# Patient Record
Sex: Female | Born: 1935 | Race: Black or African American | Hispanic: No | Smoking: Never smoker
Health system: Southern US, Community
[De-identification: ages and names within clinical notes are randomized; demographics above are authoritative.]

## PROBLEM LIST (undated history)

## (undated) DIAGNOSIS — I10 Essential (primary) hypertension: Secondary | ICD-10-CM

## (undated) DIAGNOSIS — M47816 Spondylosis without myelopathy or radiculopathy, lumbar region: Secondary | ICD-10-CM

## (undated) DIAGNOSIS — E7800 Pure hypercholesterolemia, unspecified: Secondary | ICD-10-CM

## (undated) DIAGNOSIS — M8580 Other specified disorders of bone density and structure, unspecified site: Secondary | ICD-10-CM

## (undated) DIAGNOSIS — G501 Atypical facial pain: Secondary | ICD-10-CM

## (undated) DIAGNOSIS — IMO0002 Reserved for concepts with insufficient information to code with codable children: Secondary | ICD-10-CM

## (undated) DIAGNOSIS — M5030 Other cervical disc degeneration, unspecified cervical region: Secondary | ICD-10-CM

## (undated) DIAGNOSIS — M7120 Synovial cyst of popliteal space [Baker], unspecified knee: Secondary | ICD-10-CM

## (undated) DIAGNOSIS — E1165 Type 2 diabetes mellitus with hyperglycemia: Secondary | ICD-10-CM

## (undated) DIAGNOSIS — E119 Type 2 diabetes mellitus without complications: Secondary | ICD-10-CM

## (undated) DIAGNOSIS — F419 Anxiety disorder, unspecified: Secondary | ICD-10-CM

## (undated) DIAGNOSIS — K219 Gastro-esophageal reflux disease without esophagitis: Secondary | ICD-10-CM

## (undated) DIAGNOSIS — M179 Osteoarthritis of knee, unspecified: Secondary | ICD-10-CM

## (undated) DIAGNOSIS — B351 Tinea unguium: Secondary | ICD-10-CM

## (undated) DIAGNOSIS — E669 Obesity, unspecified: Secondary | ICD-10-CM

## (undated) DIAGNOSIS — K5730 Diverticulosis of large intestine without perforation or abscess without bleeding: Secondary | ICD-10-CM

## (undated) DIAGNOSIS — M1710 Unilateral primary osteoarthritis, unspecified knee: Secondary | ICD-10-CM

## (undated) HISTORY — DX: Type 2 diabetes mellitus without complications: E11.9

## (undated) HISTORY — DX: Reserved for concepts with insufficient information to code with codable children: IMO0002

## (undated) HISTORY — DX: Tinea unguium: B35.1

## (undated) HISTORY — DX: Osteoarthritis of knee, unspecified: M17.9

## (undated) HISTORY — DX: Obesity, unspecified: E66.9

## (undated) HISTORY — DX: Pure hypercholesterolemia, unspecified: E78.00

## (undated) HISTORY — DX: Other specified disorders of bone density and structure, unspecified site: M85.80

## (undated) HISTORY — DX: Diverticulosis of large intestine without perforation or abscess without bleeding: K57.30

## (undated) HISTORY — DX: Essential (primary) hypertension: I10

## (undated) HISTORY — DX: Gastro-esophageal reflux disease without esophagitis: K21.9

## (undated) HISTORY — DX: Atypical facial pain: G50.1

## (undated) HISTORY — DX: Spondylosis without myelopathy or radiculopathy, lumbar region: M47.816

## (undated) HISTORY — DX: Anxiety disorder, unspecified: F41.9

## (undated) HISTORY — DX: Unilateral primary osteoarthritis, unspecified knee: M17.10

## (undated) HISTORY — DX: Synovial cyst of popliteal space (Baker), unspecified knee: M71.20

## (undated) HISTORY — DX: Other cervical disc degeneration, unspecified cervical region: M50.30

## (undated) HISTORY — DX: Type 2 diabetes mellitus with hyperglycemia: E11.65

---

## 1997-11-11 ENCOUNTER — Other Ambulatory Visit: Admission: RE | Admit: 1997-11-11 | Discharge: 1997-11-11 | Payer: Self-pay | Admitting: Obstetrics and Gynecology

## 1998-06-18 ENCOUNTER — Other Ambulatory Visit: Admission: RE | Admit: 1998-06-18 | Discharge: 1998-06-18 | Payer: Self-pay | Admitting: Obstetrics and Gynecology

## 1999-10-22 ENCOUNTER — Encounter: Admission: RE | Admit: 1999-10-22 | Discharge: 1999-10-22 | Payer: Self-pay | Admitting: Pulmonary Disease

## 1999-10-22 ENCOUNTER — Encounter: Payer: Self-pay | Admitting: Pulmonary Disease

## 1999-12-31 ENCOUNTER — Other Ambulatory Visit: Admission: RE | Admit: 1999-12-31 | Discharge: 1999-12-31 | Payer: Self-pay | Admitting: Obstetrics and Gynecology

## 2000-02-29 ENCOUNTER — Other Ambulatory Visit: Admission: RE | Admit: 2000-02-29 | Discharge: 2000-02-29 | Payer: Self-pay | Admitting: Obstetrics and Gynecology

## 2000-03-30 ENCOUNTER — Encounter (INDEPENDENT_AMBULATORY_CARE_PROVIDER_SITE_OTHER): Payer: Self-pay | Admitting: Specialist

## 2000-03-30 ENCOUNTER — Other Ambulatory Visit: Admission: RE | Admit: 2000-03-30 | Discharge: 2000-03-30 | Payer: Self-pay | Admitting: Obstetrics and Gynecology

## 2000-04-12 HISTORY — PX: VAGINAL HYSTERECTOMY: SUR661

## 2000-06-27 ENCOUNTER — Other Ambulatory Visit: Admission: RE | Admit: 2000-06-27 | Discharge: 2000-06-27 | Payer: Self-pay | Admitting: Obstetrics and Gynecology

## 2000-07-25 ENCOUNTER — Encounter: Payer: Self-pay | Admitting: Obstetrics and Gynecology

## 2000-07-29 ENCOUNTER — Ambulatory Visit (HOSPITAL_COMMUNITY): Admission: RE | Admit: 2000-07-29 | Discharge: 2000-07-29 | Payer: Self-pay | Admitting: Obstetrics and Gynecology

## 2000-07-29 ENCOUNTER — Encounter (INDEPENDENT_AMBULATORY_CARE_PROVIDER_SITE_OTHER): Payer: Self-pay | Admitting: Specialist

## 2000-08-22 ENCOUNTER — Encounter: Admission: RE | Admit: 2000-08-22 | Discharge: 2000-11-20 | Payer: Self-pay | Admitting: *Deleted

## 2000-09-08 ENCOUNTER — Other Ambulatory Visit: Admission: RE | Admit: 2000-09-08 | Discharge: 2000-09-08 | Payer: Self-pay | Admitting: Obstetrics and Gynecology

## 2000-10-27 ENCOUNTER — Encounter: Payer: Self-pay | Admitting: Pulmonary Disease

## 2000-10-27 ENCOUNTER — Encounter: Admission: RE | Admit: 2000-10-27 | Discharge: 2000-10-27 | Payer: Self-pay | Admitting: Pulmonary Disease

## 2000-11-24 ENCOUNTER — Inpatient Hospital Stay (HOSPITAL_COMMUNITY): Admission: RE | Admit: 2000-11-24 | Discharge: 2000-11-26 | Payer: Self-pay | Admitting: Obstetrics and Gynecology

## 2000-11-24 ENCOUNTER — Encounter (INDEPENDENT_AMBULATORY_CARE_PROVIDER_SITE_OTHER): Payer: Self-pay | Admitting: Specialist

## 2001-01-23 ENCOUNTER — Other Ambulatory Visit: Admission: RE | Admit: 2001-01-23 | Discharge: 2001-01-23 | Payer: Self-pay | Admitting: Obstetrics and Gynecology

## 2001-04-24 ENCOUNTER — Other Ambulatory Visit: Admission: RE | Admit: 2001-04-24 | Discharge: 2001-04-24 | Payer: Self-pay | Admitting: Obstetrics and Gynecology

## 2001-11-09 ENCOUNTER — Encounter: Admission: RE | Admit: 2001-11-09 | Discharge: 2001-11-09 | Payer: Self-pay | Admitting: Obstetrics and Gynecology

## 2001-11-09 ENCOUNTER — Encounter: Payer: Self-pay | Admitting: Obstetrics and Gynecology

## 2002-12-06 ENCOUNTER — Encounter: Admission: RE | Admit: 2002-12-06 | Discharge: 2002-12-06 | Payer: Self-pay | Admitting: Pulmonary Disease

## 2002-12-06 ENCOUNTER — Encounter: Payer: Self-pay | Admitting: Pulmonary Disease

## 2003-01-01 ENCOUNTER — Encounter (HOSPITAL_BASED_OUTPATIENT_CLINIC_OR_DEPARTMENT_OTHER): Admission: RE | Admit: 2003-01-01 | Discharge: 2003-04-01 | Payer: Self-pay | Admitting: Internal Medicine

## 2003-04-18 ENCOUNTER — Encounter (HOSPITAL_BASED_OUTPATIENT_CLINIC_OR_DEPARTMENT_OTHER): Admission: RE | Admit: 2003-04-18 | Discharge: 2003-05-03 | Payer: Self-pay | Admitting: Internal Medicine

## 2003-12-12 ENCOUNTER — Encounter: Admission: RE | Admit: 2003-12-12 | Discharge: 2003-12-12 | Payer: Self-pay | Admitting: Pulmonary Disease

## 2004-03-19 ENCOUNTER — Ambulatory Visit: Payer: Self-pay | Admitting: Pulmonary Disease

## 2004-06-19 ENCOUNTER — Ambulatory Visit: Payer: Self-pay | Admitting: Pulmonary Disease

## 2004-08-17 ENCOUNTER — Ambulatory Visit: Payer: Self-pay | Admitting: Pulmonary Disease

## 2004-10-19 ENCOUNTER — Ambulatory Visit: Payer: Self-pay | Admitting: Pulmonary Disease

## 2004-12-24 ENCOUNTER — Encounter: Admission: RE | Admit: 2004-12-24 | Discharge: 2004-12-24 | Payer: Self-pay | Admitting: Pulmonary Disease

## 2005-01-19 ENCOUNTER — Ambulatory Visit: Payer: Self-pay | Admitting: Pulmonary Disease

## 2005-02-11 ENCOUNTER — Ambulatory Visit: Payer: Self-pay | Admitting: Internal Medicine

## 2005-02-16 ENCOUNTER — Ambulatory Visit: Payer: Self-pay

## 2005-03-15 ENCOUNTER — Ambulatory Visit: Payer: Self-pay | Admitting: Internal Medicine

## 2005-04-21 ENCOUNTER — Ambulatory Visit: Payer: Self-pay | Admitting: Pulmonary Disease

## 2005-07-29 ENCOUNTER — Ambulatory Visit: Payer: Self-pay | Admitting: Pulmonary Disease

## 2005-12-01 ENCOUNTER — Ambulatory Visit: Payer: Self-pay | Admitting: Pulmonary Disease

## 2005-12-27 ENCOUNTER — Encounter: Admission: RE | Admit: 2005-12-27 | Discharge: 2005-12-27 | Payer: Self-pay | Admitting: Pulmonary Disease

## 2006-06-21 ENCOUNTER — Ambulatory Visit: Payer: Self-pay | Admitting: Pulmonary Disease

## 2006-06-21 LAB — CONVERTED CEMR LAB
ALT: 49 units/L — ABNORMAL HIGH (ref 0–40)
AST: 43 units/L — ABNORMAL HIGH (ref 0–37)
Albumin: 3.5 g/dL (ref 3.5–5.2)
Alkaline Phosphatase: 60 units/L (ref 39–117)
BUN: 7 mg/dL (ref 6–23)
Basophils Absolute: 0.1 10*3/uL (ref 0.0–0.1)
Basophils Relative: 2 % — ABNORMAL HIGH (ref 0.0–1.0)
Bilirubin, Direct: 0.3 mg/dL (ref 0.0–0.3)
CO2: 31 meq/L (ref 19–32)
Calcium: 9.3 mg/dL (ref 8.4–10.5)
Chloride: 104 meq/L (ref 96–112)
Cholesterol: 146 mg/dL (ref 0–200)
Creatinine, Ser: 0.6 mg/dL (ref 0.4–1.2)
Creatinine,U: 150.5 mg/dL
Eosinophils Absolute: 0.1 10*3/uL (ref 0.0–0.6)
Eosinophils Relative: 2.1 % (ref 0.0–5.0)
GFR calc Af Amer: 127 mL/min
GFR calc non Af Amer: 105 mL/min
Glucose, Bld: 162 mg/dL — ABNORMAL HIGH (ref 70–99)
HCT: 41.1 % (ref 36.0–46.0)
HDL: 35.5 mg/dL — ABNORMAL LOW (ref >39.0)
Hemoglobin: 13.6 g/dL (ref 12.0–15.0)
Hgb A1c MFr Bld: 7.8 %
Hgb A1c MFr Bld: 7.8 % — ABNORMAL HIGH (ref 4.6–6.0)
LDL Cholesterol: 86 mg/dL (ref 0–99)
Lymphocytes Relative: 28 % (ref 12.0–46.0)
MCHC: 33 g/dL (ref 30.0–36.0)
MCV: 80.8 fL (ref 78.0–100.0)
Microalb Creat Ratio: 21.9 mg/g (ref 0.0–30.0)
Microalb, Ur: 3.3 mg/dL — ABNORMAL HIGH (ref 0.0–1.9)
Monocytes Absolute: 0.3 10*3/uL (ref 0.2–0.7)
Monocytes Relative: 6.2 % (ref 3.0–11.0)
Neutro Abs: 3.1 10*3/uL (ref 1.4–7.7)
Neutrophils Relative %: 61.7 % (ref 43.0–77.0)
Platelets: 256 10*3/uL (ref 150–400)
Potassium: 3.9 meq/L (ref 3.5–5.1)
RBC: 5.09 M/uL (ref 3.87–5.11)
RDW: 13 % (ref 11.5–14.6)
Sodium: 142 meq/L (ref 135–145)
TSH: 0.9 microintl units/mL
TSH: 0.9 microintl units/mL (ref 0.35–5.50)
Total Bilirubin: 1.5 mg/dL — ABNORMAL HIGH (ref 0.3–1.2)
Total CHOL/HDL Ratio: 4.1
Total Protein: 7.3 g/dL (ref 6.0–8.3)
Triglycerides: 123 mg/dL (ref 0–149)
VLDL: 25 mg/dL (ref 0–40)
WBC: 5 10*3/uL (ref 4.5–10.5)

## 2006-12-22 ENCOUNTER — Ambulatory Visit: Payer: Self-pay | Admitting: Pulmonary Disease

## 2006-12-22 DIAGNOSIS — K5730 Diverticulosis of large intestine without perforation or abscess without bleeding: Secondary | ICD-10-CM | POA: Insufficient documentation

## 2006-12-22 DIAGNOSIS — R0789 Other chest pain: Secondary | ICD-10-CM | POA: Insufficient documentation

## 2006-12-22 DIAGNOSIS — IMO0002 Reserved for concepts with insufficient information to code with codable children: Secondary | ICD-10-CM | POA: Insufficient documentation

## 2006-12-22 DIAGNOSIS — E785 Hyperlipidemia, unspecified: Secondary | ICD-10-CM

## 2006-12-22 DIAGNOSIS — E1169 Type 2 diabetes mellitus with other specified complication: Secondary | ICD-10-CM | POA: Insufficient documentation

## 2006-12-22 DIAGNOSIS — F411 Generalized anxiety disorder: Secondary | ICD-10-CM | POA: Insufficient documentation

## 2006-12-22 DIAGNOSIS — M5030 Other cervical disc degeneration, unspecified cervical region: Secondary | ICD-10-CM | POA: Insufficient documentation

## 2006-12-22 DIAGNOSIS — M7120 Synovial cyst of popliteal space [Baker], unspecified knee: Secondary | ICD-10-CM | POA: Insufficient documentation

## 2006-12-22 DIAGNOSIS — M1710 Unilateral primary osteoarthritis, unspecified knee: Secondary | ICD-10-CM

## 2006-12-22 DIAGNOSIS — G501 Atypical facial pain: Secondary | ICD-10-CM | POA: Insufficient documentation

## 2006-12-22 DIAGNOSIS — B351 Tinea unguium: Secondary | ICD-10-CM | POA: Insufficient documentation

## 2006-12-22 DIAGNOSIS — M47817 Spondylosis without myelopathy or radiculopathy, lumbosacral region: Secondary | ICD-10-CM | POA: Insufficient documentation

## 2006-12-22 DIAGNOSIS — I10 Essential (primary) hypertension: Secondary | ICD-10-CM | POA: Insufficient documentation

## 2006-12-22 DIAGNOSIS — K219 Gastro-esophageal reflux disease without esophagitis: Secondary | ICD-10-CM | POA: Insufficient documentation

## 2006-12-22 DIAGNOSIS — R079 Chest pain, unspecified: Secondary | ICD-10-CM

## 2006-12-22 DIAGNOSIS — M8580 Other specified disorders of bone density and structure, unspecified site: Secondary | ICD-10-CM | POA: Insufficient documentation

## 2006-12-22 LAB — CONVERTED CEMR LAB
BUN: 10 mg/dL (ref 6–23)
CO2: 26 meq/L (ref 19–32)
Calcium: 9.9 mg/dL (ref 8.4–10.5)
Chloride: 108 meq/L (ref 96–112)
Cholesterol: 150 mg/dL (ref 0–200)
Creatinine, Ser: 0.7 mg/dL (ref 0.4–1.2)
GFR calc Af Amer: 107 mL/min
GFR calc non Af Amer: 88 mL/min
Glucose, Bld: 172 mg/dL — ABNORMAL HIGH (ref 70–99)
HDL: 36.5 mg/dL — ABNORMAL LOW (ref >39.0)
Hgb A1c MFr Bld: 7.5 % — ABNORMAL HIGH (ref 4.6–6.0)
LDL Cholesterol: 95 mg/dL (ref 0–99)
Potassium: 4.1 meq/L (ref 3.5–5.1)
Sodium: 140 meq/L (ref 135–145)
Total CHOL/HDL Ratio: 4.1
Triglycerides: 92 mg/dL (ref 0–149)
VLDL: 18 mg/dL (ref 0–40)

## 2006-12-29 ENCOUNTER — Encounter: Admission: RE | Admit: 2006-12-29 | Discharge: 2006-12-29 | Payer: Self-pay | Admitting: Pulmonary Disease

## 2007-04-24 ENCOUNTER — Ambulatory Visit: Payer: Self-pay | Admitting: Pulmonary Disease

## 2007-04-30 LAB — CONVERTED CEMR LAB
ALT: 50 units/L — ABNORMAL HIGH (ref 0–35)
AST: 37 units/L (ref 0–37)
Albumin: 3.9 g/dL (ref 3.5–5.2)
Alkaline Phosphatase: 53 units/L (ref 39–117)
BUN: 6 mg/dL (ref 6–23)
Basophils Absolute: 0 10*3/uL (ref 0.0–0.1)
Basophils Relative: 0.5 % (ref 0.0–1.0)
Bilirubin, Direct: 0.2 mg/dL (ref 0.0–0.3)
CO2: 31 meq/L (ref 19–32)
Calcium: 9.4 mg/dL (ref 8.4–10.5)
Chloride: 105 meq/L (ref 96–112)
Cholesterol: 137 mg/dL (ref 0–200)
Creatinine, Ser: 0.8 mg/dL (ref 0.4–1.2)
Creatinine,U: 103.9 mg/dL
Eosinophils Absolute: 0.1 10*3/uL (ref 0.0–0.6)
Eosinophils Relative: 1.6 % (ref 0.0–5.0)
GFR calc Af Amer: 91 mL/min
GFR calc non Af Amer: 75 mL/min
Glucose, Bld: 132 mg/dL — ABNORMAL HIGH (ref 70–99)
HCT: 43.1 % (ref 36.0–46.0)
HDL: 32.4 mg/dL — ABNORMAL LOW (ref >39.0)
Hemoglobin: 14.5 g/dL (ref 12.0–15.0)
Hgb A1c MFr Bld: 6.8 % — ABNORMAL HIGH (ref 4.6–6.0)
LDL Cholesterol: 87 mg/dL (ref 0–99)
Lymphocytes Relative: 35.5 % (ref 12.0–46.0)
MCHC: 33.7 g/dL (ref 30.0–36.0)
MCV: 81.5 fL (ref 78.0–100.0)
Microalb Creat Ratio: 24.1 mg/g (ref 0.0–30.0)
Microalb, Ur: 2.5 mg/dL — ABNORMAL HIGH (ref 0.0–1.9)
Monocytes Absolute: 0.3 10*3/uL (ref 0.2–0.7)
Monocytes Relative: 5.8 % (ref 3.0–11.0)
Neutro Abs: 2.8 10*3/uL (ref 1.4–7.7)
Neutrophils Relative %: 56.6 % (ref 43.0–77.0)
Platelets: 262 10*3/uL (ref 150–400)
Potassium: 4.2 meq/L (ref 3.5–5.1)
RBC: 5.29 M/uL — ABNORMAL HIGH (ref 3.87–5.11)
RDW: 13.2 % (ref 11.5–14.6)
Sodium: 142 meq/L (ref 135–145)
TSH: 1.05 microintl units/mL (ref 0.35–5.50)
Total Bilirubin: 1.2 mg/dL (ref 0.3–1.2)
Total CHOL/HDL Ratio: 4.2
Total Protein: 7.6 g/dL (ref 6.0–8.3)
Triglycerides: 89 mg/dL (ref 0–149)
VLDL: 18 mg/dL (ref 0–40)
WBC: 4.9 10*3/uL (ref 4.5–10.5)

## 2007-06-05 ENCOUNTER — Encounter: Payer: Self-pay | Admitting: Pulmonary Disease

## 2007-08-23 ENCOUNTER — Ambulatory Visit: Payer: Self-pay | Admitting: Pulmonary Disease

## 2007-08-23 ENCOUNTER — Ambulatory Visit: Payer: Self-pay | Admitting: Family Medicine

## 2007-08-23 LAB — CONVERTED CEMR LAB
BUN: 5 mg/dL — ABNORMAL LOW (ref 6–23)
CO2: 30 meq/L (ref 19–32)
Calcium: 9.5 mg/dL (ref 8.4–10.5)
Chloride: 108 meq/L (ref 96–112)
Creatinine, Ser: 0.7 mg/dL (ref 0.4–1.2)
GFR calc Af Amer: 106 mL/min
GFR calc non Af Amer: 88 mL/min
Glucose, Bld: 142 mg/dL — ABNORMAL HIGH (ref 70–99)
Hgb A1c MFr Bld: 7 % — ABNORMAL HIGH (ref 4.6–6.0)
Potassium: 4.3 meq/L (ref 3.5–5.1)
Sodium: 142 meq/L (ref 135–145)

## 2007-10-10 ENCOUNTER — Encounter: Payer: Self-pay | Admitting: Pulmonary Disease

## 2007-11-08 ENCOUNTER — Ambulatory Visit: Payer: Self-pay | Admitting: Internal Medicine

## 2007-11-08 DIAGNOSIS — S335XXA Sprain of ligaments of lumbar spine, initial encounter: Secondary | ICD-10-CM | POA: Insufficient documentation

## 2008-01-02 ENCOUNTER — Encounter: Admission: RE | Admit: 2008-01-02 | Discharge: 2008-01-02 | Payer: Self-pay | Admitting: Obstetrics and Gynecology

## 2008-02-19 ENCOUNTER — Ambulatory Visit: Payer: Self-pay | Admitting: Pulmonary Disease

## 2008-02-19 DIAGNOSIS — M25512 Pain in left shoulder: Secondary | ICD-10-CM | POA: Insufficient documentation

## 2008-02-19 DIAGNOSIS — M25519 Pain in unspecified shoulder: Secondary | ICD-10-CM

## 2008-02-25 LAB — CONVERTED CEMR LAB
ALT: 50 units/L — ABNORMAL HIGH (ref 0–35)
AST: 39 units/L — ABNORMAL HIGH (ref 0–37)
Albumin: 3.9 g/dL (ref 3.5–5.2)
Alkaline Phosphatase: 51 units/L (ref 39–117)
BUN: 6 mg/dL (ref 6–23)
Basophils Absolute: 0 10*3/uL (ref 0.0–0.1)
Basophils Relative: 0.4 % (ref 0.0–3.0)
Bilirubin, Direct: 0.2 mg/dL (ref 0.0–0.3)
CO2: 30 meq/L (ref 19–32)
Calcium: 9.6 mg/dL (ref 8.4–10.5)
Chloride: 106 meq/L (ref 96–112)
Cholesterol: 124 mg/dL (ref 0–200)
Creatinine, Ser: 0.5 mg/dL (ref 0.4–1.2)
Eosinophils Absolute: 0.1 10*3/uL (ref 0.0–0.7)
Eosinophils Relative: 1.5 % (ref 0.0–5.0)
GFR calc Af Amer: 156 mL/min
GFR calc non Af Amer: 129 mL/min
Glucose, Bld: 109 mg/dL — ABNORMAL HIGH (ref 70–99)
HCT: 41.9 % (ref 36.0–46.0)
HDL: 32.9 mg/dL — ABNORMAL LOW (ref >39.0)
Hemoglobin: 13.9 g/dL (ref 12.0–15.0)
Hgb A1c MFr Bld: 7.2 % — ABNORMAL HIGH (ref 4.6–6.0)
LDL Cholesterol: 74 mg/dL (ref 0–99)
Lymphocytes Relative: 41.9 % (ref 12.0–46.0)
MCHC: 33.3 g/dL (ref 30.0–36.0)
MCV: 81 fL (ref 78.0–100.0)
Monocytes Absolute: 0.4 10*3/uL (ref 0.1–1.0)
Monocytes Relative: 7.2 % (ref 3.0–12.0)
Neutro Abs: 2.6 10*3/uL (ref 1.4–7.7)
Neutrophils Relative %: 49 % (ref 43.0–77.0)
Platelets: 242 10*3/uL (ref 150–400)
Potassium: 4.1 meq/L (ref 3.5–5.1)
RBC: 5.16 M/uL — ABNORMAL HIGH (ref 3.87–5.11)
RDW: 13.2 % (ref 11.5–14.6)
Sodium: 143 meq/L (ref 135–145)
TSH: 1.2 microintl units/mL (ref 0.35–5.50)
Total Bilirubin: 1.5 mg/dL — ABNORMAL HIGH (ref 0.3–1.2)
Total CHOL/HDL Ratio: 3.8
Total Protein: 7.5 g/dL (ref 6.0–8.3)
Triglycerides: 88 mg/dL (ref 0–149)
VLDL: 18 mg/dL (ref 0–40)
WBC: 5.3 10*3/uL (ref 4.5–10.5)

## 2008-03-01 ENCOUNTER — Telehealth (INDEPENDENT_AMBULATORY_CARE_PROVIDER_SITE_OTHER): Payer: Self-pay | Admitting: *Deleted

## 2008-03-04 ENCOUNTER — Encounter: Payer: Self-pay | Admitting: Pulmonary Disease

## 2008-03-26 ENCOUNTER — Encounter: Admission: RE | Admit: 2008-03-26 | Discharge: 2008-06-24 | Payer: Self-pay | Admitting: Pulmonary Disease

## 2008-03-26 ENCOUNTER — Encounter: Payer: Self-pay | Admitting: Pulmonary Disease

## 2008-08-06 ENCOUNTER — Encounter: Payer: Self-pay | Admitting: Pulmonary Disease

## 2008-08-06 ENCOUNTER — Encounter: Admission: RE | Admit: 2008-08-06 | Discharge: 2008-08-06 | Payer: Self-pay | Admitting: Pulmonary Disease

## 2008-08-19 ENCOUNTER — Ambulatory Visit: Payer: Self-pay | Admitting: Pulmonary Disease

## 2008-08-19 LAB — CONVERTED CEMR LAB
ALT: 50 units/L — ABNORMAL HIGH (ref 0–35)
AST: 42 units/L — ABNORMAL HIGH (ref 0–37)
Albumin: 3.7 g/dL (ref 3.5–5.2)
Alkaline Phosphatase: 62 units/L (ref 39–117)
BUN: 10 mg/dL (ref 6–23)
Bilirubin, Direct: 0.2 mg/dL (ref 0.0–0.3)
CO2: 30 meq/L (ref 19–32)
Calcium: 9 mg/dL (ref 8.4–10.5)
Chloride: 107 meq/L (ref 96–112)
Cholesterol: 124 mg/dL (ref 0–200)
Creatinine, Ser: 0.5 mg/dL (ref 0.4–1.2)
GFR calc non Af Amer: 129.07 mL/min (ref >60)
Glucose, Bld: 161 mg/dL — ABNORMAL HIGH (ref 70–99)
HDL: 33.6 mg/dL — ABNORMAL LOW (ref >39.00)
Hgb A1c MFr Bld: 7.8 % — ABNORMAL HIGH (ref 4.6–6.5)
LDL Cholesterol: 77 mg/dL (ref 0–99)
Potassium: 3.7 meq/L (ref 3.5–5.1)
Sodium: 142 meq/L (ref 135–145)
Total Bilirubin: 1.3 mg/dL — ABNORMAL HIGH (ref 0.3–1.2)
Total CHOL/HDL Ratio: 4
Total Protein: 7.2 g/dL (ref 6.0–8.3)
Triglycerides: 69 mg/dL (ref 0.0–149.0)
VLDL: 13.8 mg/dL (ref 0.0–40.0)

## 2008-08-28 LAB — CONVERTED CEMR LAB: Vit D, 25-Hydroxy: 38 ng/mL (ref 30–89)

## 2008-10-04 ENCOUNTER — Telehealth: Payer: Self-pay | Admitting: Pulmonary Disease

## 2008-11-05 ENCOUNTER — Encounter: Admission: RE | Admit: 2008-11-05 | Discharge: 2009-02-03 | Payer: Self-pay | Admitting: Pulmonary Disease

## 2008-12-06 ENCOUNTER — Encounter: Payer: Self-pay | Admitting: Pulmonary Disease

## 2009-01-02 ENCOUNTER — Encounter: Admission: RE | Admit: 2009-01-02 | Discharge: 2009-01-02 | Payer: Self-pay | Admitting: Pulmonary Disease

## 2009-02-09 ENCOUNTER — Encounter: Payer: Self-pay | Admitting: Pulmonary Disease

## 2009-02-17 ENCOUNTER — Ambulatory Visit: Payer: Self-pay | Admitting: Pulmonary Disease

## 2009-02-18 LAB — CONVERTED CEMR LAB
ALT: 63 units/L — ABNORMAL HIGH (ref 0–35)
AST: 51 units/L — ABNORMAL HIGH (ref 0–37)
Albumin: 4 g/dL (ref 3.5–5.2)
Alkaline Phosphatase: 55 units/L (ref 39–117)
BUN: 8 mg/dL (ref 6–23)
Basophils Absolute: 0 10*3/uL (ref 0.0–0.1)
Basophils Relative: 0.7 % (ref 0.0–3.0)
Bilirubin, Direct: 0.2 mg/dL (ref 0.0–0.3)
CO2: 30 meq/L (ref 19–32)
Calcium: 9.6 mg/dL (ref 8.4–10.5)
Chloride: 103 meq/L (ref 96–112)
Cholesterol: 141 mg/dL (ref 0–200)
Creatinine, Ser: 0.7 mg/dL (ref 0.4–1.2)
Eosinophils Absolute: 0.1 10*3/uL (ref 0.0–0.7)
Eosinophils Relative: 1.9 % (ref 0.0–5.0)
GFR calc non Af Amer: 87.42 mL/min (ref >60)
Glucose, Bld: 155 mg/dL — ABNORMAL HIGH (ref 70–99)
HCT: 41.8 % (ref 36.0–46.0)
HDL: 35.6 mg/dL — ABNORMAL LOW (ref >39.00)
Hemoglobin: 13.8 g/dL (ref 12.0–15.0)
Hgb A1c MFr Bld: 8 % — ABNORMAL HIGH (ref 4.6–6.5)
LDL Cholesterol: 88 mg/dL (ref 0–99)
Lymphocytes Relative: 36.8 % (ref 12.0–46.0)
Lymphs Abs: 1.8 10*3/uL (ref 0.7–4.0)
MCHC: 33.1 g/dL (ref 30.0–36.0)
MCV: 84.1 fL (ref 78.0–100.0)
Monocytes Absolute: 0.4 10*3/uL (ref 0.1–1.0)
Monocytes Relative: 7.7 % (ref 3.0–12.0)
Neutro Abs: 2.5 10*3/uL (ref 1.4–7.7)
Neutrophils Relative %: 52.9 % (ref 43.0–77.0)
Platelets: 215 10*3/uL (ref 150.0–400.0)
Potassium: 4.3 meq/L (ref 3.5–5.1)
RBC: 4.97 M/uL (ref 3.87–5.11)
RDW: 12.6 % (ref 11.5–14.6)
Sodium: 143 meq/L (ref 135–145)
TSH: 0.82 microintl units/mL (ref 0.35–5.50)
Total Bilirubin: 1.3 mg/dL — ABNORMAL HIGH (ref 0.3–1.2)
Total CHOL/HDL Ratio: 4
Total Protein: 7.7 g/dL (ref 6.0–8.3)
Triglycerides: 87 mg/dL (ref 0.0–149.0)
VLDL: 17.4 mg/dL (ref 0.0–40.0)
WBC: 4.8 10*3/uL (ref 4.5–10.5)

## 2009-04-15 ENCOUNTER — Encounter: Payer: Self-pay | Admitting: Pulmonary Disease

## 2009-04-15 ENCOUNTER — Encounter: Admission: RE | Admit: 2009-04-15 | Discharge: 2009-07-14 | Payer: Self-pay | Admitting: Pulmonary Disease

## 2009-04-22 ENCOUNTER — Encounter: Payer: Self-pay | Admitting: Pulmonary Disease

## 2009-05-02 ENCOUNTER — Emergency Department (HOSPITAL_COMMUNITY): Admission: EM | Admit: 2009-05-02 | Discharge: 2009-05-03 | Payer: Self-pay | Admitting: Emergency Medicine

## 2009-05-05 ENCOUNTER — Telehealth (INDEPENDENT_AMBULATORY_CARE_PROVIDER_SITE_OTHER): Payer: Self-pay | Admitting: *Deleted

## 2009-05-13 ENCOUNTER — Ambulatory Visit: Payer: Self-pay | Admitting: Pulmonary Disease

## 2009-05-14 LAB — CONVERTED CEMR LAB: Hgb A1c MFr Bld: 7.7 % — ABNORMAL HIGH (ref 4.6–6.5)

## 2009-07-15 ENCOUNTER — Encounter: Admission: RE | Admit: 2009-07-15 | Discharge: 2009-07-15 | Payer: Self-pay | Admitting: Pulmonary Disease

## 2009-07-15 ENCOUNTER — Encounter: Payer: Self-pay | Admitting: Pulmonary Disease

## 2009-08-06 ENCOUNTER — Encounter: Payer: Self-pay | Admitting: Pulmonary Disease

## 2009-08-18 ENCOUNTER — Ambulatory Visit: Payer: Self-pay | Admitting: Pulmonary Disease

## 2009-08-24 DIAGNOSIS — H409 Unspecified glaucoma: Secondary | ICD-10-CM | POA: Insufficient documentation

## 2009-08-24 LAB — CONVERTED CEMR LAB
ALT: 49 units/L — ABNORMAL HIGH (ref 0–35)
AST: 43 units/L — ABNORMAL HIGH (ref 0–37)
Albumin: 4 g/dL (ref 3.5–5.2)
Alkaline Phosphatase: 48 units/L (ref 39–117)
BUN: 12 mg/dL (ref 6–23)
Basophils Absolute: 0 10*3/uL (ref 0.0–0.1)
Basophils Relative: 0.6 % (ref 0.0–3.0)
Bilirubin, Direct: 0.2 mg/dL (ref 0.0–0.3)
CO2: 30 meq/L (ref 19–32)
Calcium: 9.8 mg/dL (ref 8.4–10.5)
Chloride: 105 meq/L (ref 96–112)
Cholesterol: 141 mg/dL (ref 0–200)
Creatinine, Ser: 0.6 mg/dL (ref 0.4–1.2)
Eosinophils Absolute: 0.1 10*3/uL (ref 0.0–0.7)
Eosinophils Relative: 1.9 % (ref 0.0–5.0)
GFR calc non Af Amer: 106.33 mL/min (ref >60)
Glucose, Bld: 142 mg/dL — ABNORMAL HIGH (ref 70–99)
HCT: 39.9 % (ref 36.0–46.0)
HDL: 37.5 mg/dL — ABNORMAL LOW (ref >39.00)
Hemoglobin: 13.5 g/dL (ref 12.0–15.0)
Hgb A1c MFr Bld: 7.3 % — ABNORMAL HIGH (ref 4.6–6.5)
LDL Cholesterol: 84 mg/dL (ref 0–99)
Lymphocytes Relative: 35.1 % (ref 12.0–46.0)
Lymphs Abs: 1.9 10*3/uL (ref 0.7–4.0)
MCHC: 33.8 g/dL (ref 30.0–36.0)
MCV: 81 fL (ref 78.0–100.0)
Monocytes Absolute: 0.4 10*3/uL (ref 0.1–1.0)
Monocytes Relative: 7.1 % (ref 3.0–12.0)
Neutro Abs: 2.9 10*3/uL (ref 1.4–7.7)
Neutrophils Relative %: 55.3 % (ref 43.0–77.0)
Platelets: 229 10*3/uL (ref 150.0–400.0)
Potassium: 4.1 meq/L (ref 3.5–5.1)
RBC: 4.92 M/uL (ref 3.87–5.11)
RDW: 13.8 % (ref 11.5–14.6)
Sodium: 142 meq/L (ref 135–145)
TSH: 0.86 microintl units/mL (ref 0.35–5.50)
Total Bilirubin: 1.4 mg/dL — ABNORMAL HIGH (ref 0.3–1.2)
Total CHOL/HDL Ratio: 4
Total Protein: 7.4 g/dL (ref 6.0–8.3)
Triglycerides: 100 mg/dL (ref 0.0–149.0)
VLDL: 20 mg/dL (ref 0.0–40.0)
Vit D, 25-Hydroxy: 40 ng/mL (ref 30–89)
WBC: 5.3 10*3/uL (ref 4.5–10.5)

## 2009-10-15 ENCOUNTER — Encounter: Admission: RE | Admit: 2009-10-15 | Discharge: 2009-12-17 | Payer: Self-pay | Admitting: Pulmonary Disease

## 2009-10-16 ENCOUNTER — Encounter: Payer: Self-pay | Admitting: Pulmonary Disease

## 2009-11-24 ENCOUNTER — Encounter: Payer: Self-pay | Admitting: Pulmonary Disease

## 2009-11-24 ENCOUNTER — Ambulatory Visit: Payer: Self-pay | Admitting: Internal Medicine

## 2009-12-17 ENCOUNTER — Encounter: Payer: Self-pay | Admitting: Pulmonary Disease

## 2010-01-06 ENCOUNTER — Encounter: Admission: RE | Admit: 2010-01-06 | Discharge: 2010-01-06 | Payer: Self-pay | Admitting: Pulmonary Disease

## 2010-01-20 ENCOUNTER — Encounter: Payer: Self-pay | Admitting: Pulmonary Disease

## 2010-02-16 ENCOUNTER — Ambulatory Visit: Payer: Self-pay | Admitting: Pulmonary Disease

## 2010-02-19 LAB — CONVERTED CEMR LAB
ALT: 42 units/L — ABNORMAL HIGH (ref 0–35)
AST: 38 units/L — ABNORMAL HIGH (ref 0–37)
Albumin: 3.9 g/dL (ref 3.5–5.2)
Alkaline Phosphatase: 49 units/L (ref 39–117)
BUN: 12 mg/dL (ref 6–23)
Bilirubin, Direct: 0.1 mg/dL (ref 0.0–0.3)
CO2: 31 meq/L (ref 19–32)
Calcium: 10 mg/dL (ref 8.4–10.5)
Chloride: 104 meq/L (ref 96–112)
Cholesterol: 126 mg/dL (ref 0–200)
Creatinine, Ser: 0.7 mg/dL (ref 0.4–1.2)
GFR calc non Af Amer: 87.17 mL/min (ref >60)
Glucose, Bld: 121 mg/dL — ABNORMAL HIGH (ref 70–99)
HDL: 34.3 mg/dL — ABNORMAL LOW (ref >39.00)
Hgb A1c MFr Bld: 7.3 % — ABNORMAL HIGH (ref 4.6–6.5)
LDL Cholesterol: 78 mg/dL (ref 0–99)
Potassium: 4.7 meq/L (ref 3.5–5.1)
Sodium: 142 meq/L (ref 135–145)
Total Bilirubin: 0.9 mg/dL (ref 0.3–1.2)
Total CHOL/HDL Ratio: 4
Total Protein: 7.3 g/dL (ref 6.0–8.3)
Triglycerides: 71 mg/dL (ref 0.0–149.0)
VLDL: 14.2 mg/dL (ref 0.0–40.0)

## 2010-02-25 ENCOUNTER — Encounter: Payer: Self-pay | Admitting: Pulmonary Disease

## 2010-05-14 NOTE — Assessment & Plan Note (Signed)
Summary: rov 6 months////kp   Chief Complaint:  6 month ROV....  History of Present Illness: 76 y/o BF here for a follow up visit... she has mult med problems as noted below...  she is c/o some pain in her left shoulder x 2 weeks... pain incr w/ movement & sl tender as well... denies trauma or motion/ activity that may have brought this on... no prev arthritic history or shoulder problems... she tried heat w/ some relief...    Current Problem List:  HYPERTENSION (ICD-401.9) - controlled on NORVASC 5mg /d  & LISINOPRIL 20mg /d... takes regularly and tol well... BP=136/80 today and even better at home... denies HA, fatigue, visual changes, CP, palipit, dizziness, syncope, dyspnea, edema, etc...  CHEST PAIN (ICD-786.50) - Atypical CP eval 11/06 by Nunzio Cory- EKG w/ NSSTTWA, & Nuclear StressTest neg- no ischemia or infarct, EF=70%... no recent symptoms... she's been too sedentary but promises to walk...  HYPERCHOLESTEROLEMIA (ICD-272.0) - she tries to follow a low carb, low fat diet... on SIMVASTATIN 20mg /d... weight up to 189# today...  ~  baseline FLP 5/04 on diet alone TChol 270, TG 108, HDL 35, LDL 214...  ~  FLP 9/08 on Simvastatin 20mg /d showed TChol 150, TG 92, HDL 36, LDL 95...  ~  FLP 1/09 showed TChol 137, TG 89, HDL 32, LDL 87... rec- continue the Simva20.  ~  FLP 11/09 =   DIABETES MELLITUS, TYPE II, UNCONTROLLED (ICD-250.02) - prev eval for endocrine by DrKrege... takes LANTUS 20u daily, plus METFORMIN 1000mg Bid & GLIMEPERIDE 4mg /d... she's been trying to follow diet, incr exercise & lose weight!  ~  labs 9/08 showed BS=172 and HgA1c=7.5.Marland KitchenMarland Kitchen she never increased her Lantus, but states her BS at home improved to 100-120 range...  ~  labs 1/09 showed BS= 132,  HgA1c= 6.8.Marland Kitchen.  ~  labs 5/09 showed BS= 142, HgA1c= 7.0.Marland KitchenMarland Kitchen rec sl incr Lantus til FBS ~ 100-110 (she didn't do this).  ~  labs 11/09 =   OBESITY (ICD-278.00) - 189#... 5' tall... BMI= 36 now... she's requesting dietary  consult...  GERD (ICD-530.81) - uses PRILOSEC 20mg /d as needed... no prev EGD.  DIVERTICULOSIS, COLON (ICD-562.10) - last colnoscopy 3/05 by DrSam w/ divertics only, f/u planned 66yrs.  DEGENERATIVE JOINT DISEASE, KNEE (ICD-715.96) - eval 2006 by DrDraper... Rx NSAID's as needed...   ~  11/09 c/o left shoulder pain... rec ~ try Etodolac and refer to DrSypher...  BAKER'S CYST (ICD-727.51)  SPONDYLOSIS, LUMBAR (ICD-721.3) - eval by DrYates 1990 w/ conservative rx rec...  DISC DISEASE, CERVICAL (ICD-722.4)  OSTEOPENIA (ICD-733.90) - BMD 2004 w/ TScore -1.7 in spine...  ~  BMD repeat 5/09 was WNL w/ TScores +0.0 in Spine,  &  +0.8 in right FemNeck...  FACIAL PAIN, ATYPICAL (ICD-350.2) - eval 2001 by neuro/ DrSchmidt and resolved...  ANXIETY (ICD-300.00)  ONYCHOMYCOSIS (ICD-110.1) - eval by [facility]iat[facility] clinic in the past...      Current Allergies (reviewed today): No known allergies   Past Medical History:        HYPERTENSION (ICD-401.9)    CHEST PAIN (ICD-786.50)    HYPERCHOLESTEROLEMIA (ICD-272.0)    DIABETES MELLITUS, TYPE II, UNCONTROLLED (ICD-250.02)    OBESITY (ICD-278.00)    GERD (ICD-530.81)    DIVERTICULOSIS, COLON (ICD-562.10)    DEGENERATIVE JOINT DISEASE, KNEE (ICD-715.96)    BAKER'S CYST (ICD-727.51)    SPONDYLOSIS, LUMBAR (ICD-721.3)    DISC DISEASE, CERVICAL (ICD-722.4)    OSTEOPENIA (ICD-733.90)    FACIAL PAIN, ATYPICAL (ICD-350.2)    ANXIETY (ICD-300.00)  ONYCHOMYCOSIS (ICD-110.1)      Past Surgical History:    Hysterectomy - S/P Vag Hyst & AP repair by Hulda Humphrey 2002    Risk Factors:  Tobacco use:  never   Review of Systems       The patient complains of dyspnea on exertion.  The patient denies anorexia, fever, weight loss, weight gain, vision loss, decreased hearing, hoarseness, chest pain, syncope, peripheral edema, prolonged cough, headaches, hemoptysis, abdominal pain, melena, hematochezia, severe indigestion/heartburn,  hematuria, incontinence, muscle weakness, suspicious skin lesions, transient blindness, difficulty walking, depression, unusual weight change, abnormal bleeding, enlarged lymph nodes, and angioedema.     Vital Signs:  Patient Profile:   76 Years Old Female Weight:      189 pounds O2 Sat:      97 % O2 treatment:    Room Air Temp:     97.4 degrees F oral Pulse rate:   71 / minute BP sitting:   136 / 80  (left arm) Cuff size:   regular  Vitals Entered By: Marijo File CMA (February 19, 2008 10:30 AM)                 Physical Exam  WD, overwt, 76 y/o BF in NAD... GENERAL:  Alert & oriented; pleasant & cooperative... HEENT:  [place]/AT, EOM-wnl, PERRLA, EACs-clear, TMs-wnl, NOSE-clear, THROAT-clear & wnl. NECK:  Supple w/ full ROM; no JVD; normal carotid impulses w/o bruits; no thyromegaly or nodules palpated; no lymphadenopathy. CHEST:  Clear to P & A; without wheezes/ rales/ or rhonchi. HEART:  Regular Rhythm; without murmurs/ rubs/ or gallops. ABDOMEN:  soft & nontender, w/ sl panniculus, normal bowel sounds; no organomegaly or masses detected. EXT: without deformities, mild arthritic changes; no varicose veins/ venous insuffic/ or edema. Left shoulder w/ sl decr ROM and tender on palp... NEURO:  CN's intact;  no focal neuro deficits... DERM:  No lesions noted; no rash etc...        Impression & Recommendations:  Problem # 1:  SHOULDER PAIN (ICD-719.41) We will Rx w/ heat, Etodolac and refer to DrSypher for XRays etc... Her updated medication list for this problem includes:    Aspirin 81 Mg Tbec (Aspirin) .Marland Kitchen... 1 tab daily    Vicodin 5-500 Mg Tabs (Hydrocodone-acetaminophen) .Marland Kitchen... 1/2 -1 by mouth every 4 hr as needed    Etodolac 400 Mg Tabs (Etodolac) .Marland Kitchen... Take 1 tab by mouth two times a day w/ food as needed for shoulder pain...  Orders: Orthopedic Surgeon Referral (Ortho Surgeon)   Problem # 2:  HYPERTENSION (ICD-401.9) Controlled-  same meds. Her updated  medication list for this problem includes:    Lisinopril 20 Mg Tabs (Lisinopril) .Marland Kitchen... 1 tab daily    Norvasc 5 Mg Tabs (Amlodipine besylate) .Marland Kitchen... 1 tab daily  Orders: Venipuncture (53664) TLB-Lipid Panel (80061-LIPID) TLB-BMP (Basic Metabolic Panel-BMET) (80048-METABOL) TLB-CBC Platelet - w/Differential (85025-CBCD) TLB-Hepatic/Liver Function Pnl (80076-HEPATIC) TLB-TSH (Thyroid Stimulating Hormone) (84443-TSH) TLB-A1C / Hgb A1C (Glycohemoglobin) (83036-A1C)   Problem # 3:  HYPERCHOLESTEROLEMIA (ICD-272.0) Due for f/u FLP... Her updated medication list for this problem includes:    Simvastatin 20 Mg Tabs (Simvastatin) .Marland Kitchen... 1 tab daily at bedtime   Problem # 4:  DIABETES MELLITUS, TYPE II, UNCONTROLLED (ICD-250.02) Due for f/u labs... Her updated medication list for this problem includes:    Aspirin 81 Mg Tbec (Aspirin) .Marland Kitchen... 1 tab daily    Lisinopril 20 Mg Tabs (Lisinopril) .Marland Kitchen... 1 tab daily    Lantus Solostar 100 Unit/ml Soln (Insulin  glargine) .Marland Kitchen... 20 units subcutaneously daily    Metformin Hcl 1000 Mg Tabs (Metformin hcl) .Marland Kitchen... 1 tab two times a day    Glimepiride 4 Mg Tabs (Glimepiride) .Marland Kitchen... 1 tab daily   Problem # 5:  OBESITY (ICD-278.00) We will refer to dieticians per her request...  Problem # 6:  GERD (ICD-530.81) GI is stable-  same meds. The following medications were removed from the medication list:    Pantoprazole Sodium 40 Mg Tbec (Pantoprazole sodium) .Marland Kitchen... 1 tab daily as needed   Problem # 7:  Other Problems as noted... Aware...  Complete Medication List: 1)  Aspirin 81 Mg Tbec (Aspirin) .Marland Kitchen.. 1 tab daily 2)  Lisinopril 20 Mg Tabs (Lisinopril) .Marland Kitchen.. 1 tab daily 3)  Norvasc 5 Mg Tabs (Amlodipine besylate) .Marland Kitchen.. 1 tab daily 4)  Simvastatin 20 Mg Tabs (Simvastatin) .Marland Kitchen.. 1 tab daily at bedtime 5)  Lantus Solostar 100 Unit/ml Soln (Insulin glargine) .... 20 units subcutaneously daily 6)  Metformin Hcl 1000 Mg Tabs (Metformin hcl) .Marland Kitchen.. 1 tab two times a  day 7)  Glimepiride 4 Mg Tabs (Glimepiride) .Marland Kitchen.. 1 tab daily 8)  Calcium-vitamin D 500-125 Mg-unit Tabs (Calcium-vitamin d) .Marland Kitchen.. 1 tab two times a day 9)  Multiple Vitamins-minerals Tabs (Multiple vitamins-minerals) .Marland Kitchen.. 1 tab daily 10)  Vicodin 5-500 Mg Tabs (Hydrocodone-acetaminophen) .... 1/2 -1 by mouth every 4 hr as needed 11)  Etodolac 400 Mg Tabs (Etodolac) .... Take 1 tab by mouth two times a day w/ food as needed for shoulder pain...   Patient Instructions: 1)  Today we updated your med list- see below.... 2)  We wrote a new perscription for Etodolac to take twice daily w/ a meal for your shoulder pain... we will also set up an orthopedic evaluation. 3)  We did your fasting blood work today... please call the "phone tree" in a few days for your lab results.Marland KitchenMarland Kitchen 4)  We will arrange for a dietary consult to aid in weight loss!!! 5)  In the meanwhile slowly increase the Lantus from 20 to 22 to 24, etc... until that FASTING BS in the morning is in the 110-120 range... 6)  Try to increase your exercise program as well... 7)  Call for any problems... 8)  Please schedule a follow-up appointment in 6 months.   Prescriptions: ETODOLAC 400 MG TABS (ETODOLAC) take 1 tab by mouth two times a day w/ food as needed for shoulder pain...  #50 x prn   Entered and Authorized by:   Michele Mcalpine MD   Signed by:   Michele Mcalpine MD on 02/19/2008   Method used:   Print then Give to Patient   RxID:   [id number]  ]

## 2010-05-14 NOTE — Medication Information (Signed)
Summary: [facility]   Imported By: Lester [place] 03/03/2010 08:16:38  _____________________________________________________________________  External Attachment:    Type:   Image     Comment:   External Document

## 2010-05-14 NOTE — Miscellaneous (Signed)
Summary: BONE DENSITY  Clinical Lists Changes  Orders: Added new Test order of T-Bone Densitometry [phone]) - Signed Added new Test order of T-Lumbar Vertebral Assessment [phone]) - Signed  Appended Document: BONE DENSITY called and spoke with pt and she is aware of bmd results per SN---BMD WNL---cont the calcium and mvi daily.  pt voiced her understanding of this.

## 2010-05-14 NOTE — Medication Information (Signed)
Summary: Glucose supplies/[facility]  [facility]ucose [facility]   Imported By: Lester [place] 05/02/2009 09:52:54  _____________________________________________________________________  External Attachment:    Type:   Image     Comment:   External Document

## 2010-05-14 NOTE — Medication Information (Signed)
Summary: [facility]   Imported By: Lester [place] 04/25/2009 12:02:07  _____________________________________________________________________  External Attachment:    Type:   Image     Comment:   External Document

## 2010-05-14 NOTE — Letter (Signed)
Summary: [facility]   Imported By: Sherian Rein 07/22/2009 07:55:25  _____________________________________________________________________  External Attachment:    Type:   Image     Comment:   External Document

## 2010-05-14 NOTE — Assessment & Plan Note (Signed)
Summary: hip pain/ mbw   Chief Complaint:  lback and hip pain.  History of Present Illness: 76 y/o BF   with known history of HTN, Hyperlipidemia, DM  7/29- presents for left lower back and hip pain over last week, tender to touch and pain w/ movement. Catching pain that is severe in nature. Denies chest pain, dyspnea, orthopnea, hemoptysis, fever, n/v/d, edema, urinary sx, extremity pain/weakness.      Updated Prior Medication List: ASPIRIN 81 MG  TBEC (ASPIRIN) 1 tab daily LISINOPRIL 20 MG  TABS (LISINOPRIL) 1 tab daily NORVASC 5 MG  TABS (AMLODIPINE BESYLATE) 1 tab daily SIMVASTATIN 20 MG  TABS (SIMVASTATIN) 1 tab daily at bedtime LANTUS SOLOSTAR 100 UNIT/ML  SOLN (INSULIN GLARGINE) 20 units subcutaneously daily METFORMIN HCL 1000 MG  TABS (METFORMIN HCL) 1 tab two times a day GLIMEPIRIDE 4 MG  TABS (GLIMEPIRIDE) 1 tab daily PANTOPRAZOLE SODIUM 40 MG  TBEC (PANTOPRAZOLE SODIUM) 1 tab daily as needed CALCIUM-VITAMIN D 500-125 MG-UNIT  TABS (CALCIUM-VITAMIN D) 1 tab two times a day MULTIPLE VITAMINS-MINERALS   TABS (MULTIPLE VITAMINS-MINERALS) 1 tab daily  Current Allergies (reviewed today): No known allergies   Past Medical History:    HYPERTENSION (ICD-401.9) - controlled on NORVASC 5mg /d  & LISINOPRIL 20mg /d... takes regularly and tol well... BP=130/80 at home and 132/74 today... denies HA, fatigue, visual changes, CP, palipit, dizziness, syncope, dyspnea, edema, etc...        CHEST PAIN (ICD-786.50) - Atypical CP eval 11/06 by Nunzio Cory- EKG w/ NSSTTWA, & Nuclear    StressTest neg- no ischemia or infarct, EF=70%... no recent symptoms... she's been walking, etc without pain.        HYPERCHOLESTEROLEMIA (ICD-272.0) - she tries to follow a low carb, low fat diet... on SIMVASTATIN 20mg /d... weight down to 185# today!     ~  baseline FLP 5/04 on diet alone TChol 270, TG 108, HDL 35, LDL 214...     ~  prev FLP 9/08 on Simvastatin 20mg /d showed TChol 150, TG 92, HDL 36, LDL 95...  ~  last FLP 1/09 showed TChol 137, TG 89, HDL 32, LDL 87...        DIABETES MELLITUS, TYPE II, UNCONTROLLED (ICD-250.02) - prev eval for endocrine by DrKrege... takes LANTUS 20u daily, plus METFORMIN 1000mg Bid & GLIMEPERIDE 4mg /d... she's been exercising at home & weight down 10#.Marland Kitchen.      ~  labs 9/08 showed BS=172 and HgA1c=7.5.Marland KitchenMarland Kitchen she never increased her Lantus, but states her BS at home improved to 100-120 range...     ~  last labs 1/09 showed BS= 132,  HgA1c= 6.8.Marland Kitchen.     ~  labs today w/ BS= 142, HgA1c= 7.0.Marland KitchenMarland Kitchen rec sl incr Lantus til FBS ~ 100-110, cont weight loss diet.        OBESITY (ICD-278.00) - 185#... 5' tall... BMI= 36 now... she is down 10# the last 4 months... keep up the good work!        GERD (ICD-530.81) - uses PROTONIX 40mg /d as needed... no prev EGD.        DIVERTICULOSIS, COLON (ICD-562.10) - last colnoscopy 3/05 by DrSam w/ divertics only, f/u planned 27yrs.            Family History:    Reviewed history and no changes required:  Social History:    Reviewed history and no changes required:   Risk Factors: Tobacco use:  never  Colonoscopy History:    Date of Last Colonoscopy:  07/04/2003  Mammogram History:  Date of Last Mammogram:  12/27/2005   Review of Systems      See HPI   Vital Signs:  Patient Profile:   76 Years Old Female Weight:      183 pounds O2 Sat:      98 % O2 treatment:    Room Air Temp:     97.5 degrees F oral Pulse rate:   85 / minute BP sitting:   144 / 76  (left arm) Cuff size:   regular  Vitals Entered By: Boone Master CNA (November 08, 2007 11:28 AM)             Is Patient Diabetic? Yes  Comments Medications reviewed with patient Boone Master CNA  November 08, 2007 11:29 AM      Physical Exam  WD, sl overwt, 76 y/o BF in NAD... GENERAL:  Alert & oriented; pleasant & cooperative... HEENT:  [place]/AT, EOM-wnl, PERRLA, EACs-clear, TMs-wnl, NOSE-clear, THROAT-clear & wnl. NECK:  Supple w/ full ROM; no JVD; normal carotid impulses  w/o bruits; no thyromegaly or nodules palpated; no lymphadenopathy. CHEST:  Clear to P & A; without wheezes/ rales/ or rhonchi. HEART:  Regular Rhythm; without murmurs/ rubs/ or gallops. ABDOMEN:  soft & nontender, w/ sl panniculus, normal bowel sounds; no organomegaly or masses detected. EXT: without deformities or arthritic changes; no varicose veins/ venous insuffic/ or edema. tenderness along lumbar sacral region, neg SLR,  NEURO:  CN's intact;  no focal neuro deficits... DERM:  No lesions noted; no rash etc...        Impression & Recommendations:  Problem # 1:  LUMBAR STRAIN, ACUTE (ICD-847.2) Motrin 200mg  3 tabs two times a day with food for 5-7 days Skelaxin 800mg  three times a day as needed.  Vicodin 1/2 -1 every 4 hr as needed pain , may make you sleepy Alternate ice and heat back three times a day for 20 min Please contact office for sooner follow up if symptoms do not improve or worsen  Orders: Est. Patient Level III (27253)   Medications Added to Medication List This Visit: 1)  Metformin Hcl 1000 Mg Tabs (Metformin hcl) .Marland Kitchen.. 1 tab two times a day 2)  Calcium-vitamin D 500-125 Mg-unit Tabs (Calcium-vitamin d) .Marland Kitchen.. 1 tab two times a day 3)  Skelaxin 800 Mg Tabs (Metaxalone) .Marland Kitchen.. 1 by mouth three times a day as needed muscle spasm 4)  Vicodin 5-500 Mg Tabs (Hydrocodone-acetaminophen) .... 1/2 -1 by mouth every 4 hr as needed  Complete Medication List: 1)  Aspirin 81 Mg Tbec (Aspirin) .Marland Kitchen.. 1 tab daily 2)  Lisinopril 20 Mg Tabs (Lisinopril) .Marland Kitchen.. 1 tab daily 3)  Norvasc 5 Mg Tabs (Amlodipine besylate) .Marland Kitchen.. 1 tab daily 4)  Simvastatin 20 Mg Tabs (Simvastatin) .Marland Kitchen.. 1 tab daily at bedtime 5)  Lantus Solostar 100 Unit/ml Soln (Insulin glargine) .... 20 units subcutaneously daily 6)  Metformin Hcl 1000 Mg Tabs (Metformin hcl) .Marland Kitchen.. 1 tab two times a day 7)  Glimepiride 4 Mg Tabs (Glimepiride) .Marland Kitchen.. 1 tab daily 8)  Pantoprazole Sodium 40 Mg Tbec (Pantoprazole sodium) .Marland Kitchen.. 1 tab  daily as needed 9)  Calcium-vitamin D 500-125 Mg-unit Tabs (Calcium-vitamin d) .Marland Kitchen.. 1 tab two times a day 10)  Multiple Vitamins-minerals Tabs (Multiple vitamins-minerals) .Marland Kitchen.. 1 tab daily 11)  Skelaxin 800 Mg Tabs (Metaxalone) .Marland Kitchen.. 1 by mouth three times a day as needed muscle spasm 12)  Vicodin 5-500 Mg Tabs (Hydrocodone-acetaminophen) .... 1/2 -1 by mouth every 4 hr as needed   Patient Instructions:  1)  Motrin 200mg  3 tabs two times a day with food for 5-7 days 2)  Skelaxin 800mg  three times a day as needed.  3)  Vicodin 1/2 -1 every 4 hr as needed pain , may make you sleepy 4)  Alternate ice and heat back three times a day for 20 min 5)  Please contact office for sooner follow up if symptoms do not improve or worsen    Prescriptions: VICODIN 5-500 MG  TABS (HYDROCODONE-ACETAMINOPHEN) 1/2 -1 by mouth every 4 hr as needed  #20 x 0   Entered and Authorized by:   Rubye Oaks NP   Signed by:   Tammy Parrett NP on 11/08/2007   Method used:   Print then Give to Patient   RxID:   [id number] SKELAXIN 800 MG  TABS (METAXALONE) 1 by mouth three times a day as needed muscle spasm  #30 x 0   Entered and Authorized by:   Rubye Oaks NP   Signed by:   Tammy Parrett NP on 11/08/2007   Method used:   Electronically sent to ...       [facility] [address]       [address]       [place], [place]  [address]       Ph: [phone]       Fax: [phone]   RxID:   [id number]  ]

## 2010-05-14 NOTE — Letter (Signed)
Summary: [facility]  [facility]   Imported By: Lanelle Bal 04/22/2008 16:15:20  _____________________________________________________________________  External Attachment:    Type:   Image     Comment:   External Document

## 2010-05-14 NOTE — Miscellaneous (Signed)
Summary: [facility]  [facility]   Imported By: Lester [place] 08/13/2008 11:49:50  _____________________________________________________________________  External Attachment:    Type:   Image     Comment:   External Document

## 2010-05-14 NOTE — Letter (Signed)
Summary: [facility]  [facility]   Imported By: Lester [place] 12/23/2009 12:12:39  _____________________________________________________________________  External Attachment:    Type:   Image     Comment:   External Document

## 2010-05-14 NOTE — Letter (Signed)
Summary: [facility]  [facility]   Imported By: Lester [place] 05/02/2009 08:34:19  _____________________________________________________________________  External Attachment:    Type:   Image     Comment:   External Document

## 2010-05-14 NOTE — Miscellaneous (Signed)
Summary: BONE DENSITY  Clinical Lists Changes  Orders: Added new Test order of T-Lumbar Vertebral Assessment (77082) - Signed 

## 2010-05-14 NOTE — Progress Notes (Signed)
Summary: fax request- dr sypher's office  Phone Note From Other Clinic   Caller: robin at dr Molly Maduro sypher's office Call For: nadel Summary of Call: re: seeing pt soon (referred to them by sn). please fax ov notes/ labs, etc. to: attn: robin [phone]. office # [phone] Initial call taken by: Tivis Ringer,  March 01, 2008 9:33 AM  Follow-up for Phone Call        Recent labs and ov note were faxed via [facility]. Follow-up by: Vernie Murders,  March 01, 2008 9:43 AM

## 2010-05-14 NOTE — Medication Information (Signed)
Summary: glucose monitor/[facility]  glucose monitor/[facility]   Imported By: Lester [place] 10/11/2007 11:38:24  _____________________________________________________________________  External Attachment:    Type:   Image     Comment:   External Document

## 2010-05-14 NOTE — Letter (Signed)
Summary: [facility]  [facility]   Imported By: Sherian Rein 08/12/2009 09:42:11  _____________________________________________________________________  External Attachment:    Type:   Image     Comment:   External Document

## 2010-05-14 NOTE — Assessment & Plan Note (Signed)
Summary: 6 months/apc   CC:  6 month ROV & review of mult medical issues....  History of Present Illness: 76 y/o BF here for a follow up visit... she has mult med problems as noted below...     ~  Nov09:  c/o some pain in her left shoulder x 2 weeks... pain incr w/ movement & sl tender as well... denies trauma or motion/ activity that may have brought this on... no prev arthritic history or shoulder problems... she tried heat w/ some relief... referred to DrSypher- improved w/ Etodolac & PT...   ~  Aug 19, 2008:  she is trying to diet but gained 3# to 192# today... BS's are 140-180 fasting and we discussed increasing the Lantus dose til this is closer to 100-120... we had a frank discussion about the necessity of losing weight in this condition...    Current Problem List:  HYPERTENSION (ICD-401.9) - controlled on NORVASC 5mg /d  & LISINOPRIL 20mg /d... takes regularly and tol well... BP=136/74 today and even better at home... denies HA, fatigue, visual changes, CP, palipit, dizziness, syncope, dyspnea, edema, etc...  CHEST PAIN (ICD-786.50) - Atypical CP eval 11/06 by Nunzio Cory- EKG w/ NSSTTWA, & Nuclear StressTest neg- no ischemia or infarct, EF=70%... no recent symptoms... she's been too sedentary but promises to walk and increase her treadmill times...  HYPERCHOLESTEROLEMIA (ICD-272.0) - she tries to follow a low carb, low fat diet... on SIMVASTATIN 20mg /d... weight up to 192# today...  ~  baseline FLP 5/04 on diet alone TChol 270, TG 108, HDL 35, LDL 214...  ~  FLP 9/08 on Simvastatin 20mg /d showed TChol 150, TG 92, HDL 36, LDL 95...  ~  FLP 1/09 showed TChol 137, TG 89, HDL 32, LDL 87... rec- continue the Simva20.  ~  FLP 11/09 showed TChol 124, TG 88, HDL 33, LDL 74  ~  FLP 5/10 showed TChol 124, TG 69, HDL 34, LDL 77  DIABETES MELLITUS, TYPE II, UNCONTROLLED (ICD-250.02) - prev eval for endocrine by DrKrege... takes LANTUS 20u daily, plus METFORMIN 1000mg Bid & GLIMEPERIDE 4mg /d... she's  been trying to follow diet, incr exercise & lose weight!  ~  labs 9/08 showed BS=172 and HgA1c=7.5.Marland KitchenMarland Kitchen she never increased her Lantus, but states her BS at home improved to 100-120 range...  ~  labs 1/09 showed BS= 132,  HgA1c= 6.8.Marland Kitchen.  ~  labs 5/09 showed BS= 142, HgA1c= 7.0.Marland KitchenMarland Kitchen rec sl incr Lantus til FBS ~ 100-110 (she didn't do this).  ~  labs 11/09 showed BS= 109, A1c= 7.2  ~  labs 5/10 showed BS= 161, A1c= 7.8.Marland Kitchen. rec> incr Lantus slowly til FBS 100-120.  OBESITY (ICD-278.00) - 192#... 5' tall... BMI= 36-37 now... she's getting dietary help.  GERD (ICD-530.81) - uses PRILOSEC 20mg /d as needed... no prev EGD.  DIVERTICULOSIS, COLON (ICD-562.10) - last colnoscopy 3/05 by DrSam w/ divertics only, f/u planned 77yrs.  DEGENERATIVE JOINT DISEASE, KNEE (ICD-715.96) - eval 2006 by DrDraper... Rx NSAID's as needed...   ~  11/09 c/o left shoulder pain... rec ~ try Etodolac and refer to DrSypher- PT helped.  BAKER'S CYST (ICD-727.51)  SPONDYLOSIS, LUMBAR (ICD-721.3) - eval by DrYates 1990 w/ conservative rx rec...  DISC DISEASE, CERVICAL (ICD-722.4)  OSTEOPENIA (ICD-733.90) - BMD 2004 w/ TScore -1.7 in spine...  ~  BMD repeat 5/09 was WNL w/ TScores +0.0 in Spine,  &  +0.8 in right FemNeck...  FACIAL PAIN, ATYPICAL (ICD-350.2) - eval 2001 by neuro/ DrSchmidt and resolved...  ANXIETY (ICD-300.00)  ONYCHOMYCOSIS (ICD-110.1) - eval  by [facility] in the past...   Allergies (verified): No Known Drug Allergies  Comments:  Nurse/Medical Assistant: The patient's medications and allergies were reviewed with the patient and were updated in the Medication and Allergy Lists.  Past History:  Past Medical History:    HYPERTENSION (ICD-401.9)    CHEST PAIN (ICD-786.50)    HYPERCHOLESTEROLEMIA (ICD-272.0)    DIABETES MELLITUS, TYPE II, UNCONTROLLED (ICD-250.02)    OBESITY (ICD-278.00)    GERD (ICD-530.81)    DIVERTICULOSIS, COLON (ICD-562.10)    DEGENERATIVE JOINT DISEASE,  KNEE (ICD-715.96)    BAKER'S CYST (ICD-727.51)    SPONDYLOSIS, LUMBAR (ICD-721.3)    DISC DISEASE, CERVICAL (ICD-722.4)    OSTEOPENIA (ICD-733.90)    FACIAL PAIN, ATYPICAL (ICD-350.2)    ANXIETY (ICD-300.00)    ONYCHOMYCOSIS (ICD-110.1)  Past Surgical History:    Hysterectomy - S/P Vag Hyst & AP repair by Hulda Humphrey 2002  Family History:    Reviewed history and no changes required:  Social History:    Reviewed history and no changes required:       never smoked  Review of Systems      See HPI       The patient complains of dyspnea on exertion.  The patient denies anorexia, fever, weight loss, weight gain, vision loss, decreased hearing, hoarseness, chest pain, syncope, peripheral edema, prolonged cough, headaches, hemoptysis, abdominal pain, melena, hematochezia, severe indigestion/heartburn, hematuria, incontinence, muscle weakness, suspicious skin lesions, transient blindness, difficulty walking, depression, unusual weight change, abnormal bleeding, enlarged lymph nodes, and angioedema.    Vital Signs:  Patient profile:   76 year old female Height:      60 inches Weight:      191.38 pounds BMI:     37.51 O2 Sat:      99 % Temp:     96.8 degrees F oral Pulse rate:   68 / minute BP sitting:   136 / 74  (right arm) Cuff size:   regular  Vitals Entered By: Marijo File CMA (Aug 19, 2008 10:06 AM)  O2 Sat at Rest %:  99 O2 Flow:  room air CC: 6 month ROV & review of mult medical issues... Is Patient Diabetic? Yes  Pain Assessment Patient in pain? no      Comments pt brought in list of all meds   Physical Exam  Additional Exam:  WD, Overwt, 76 y/o BF in NAD... GENERAL:  Alert & oriented; pleasant & cooperative... HEENT:  [place]/AT, EOM-wnl, PERRLA, EACs-clear, TMs-wnl, NOSE-clear, THROAT-clear & wnl. NECK:  Supple w/ full ROM; no JVD; normal carotid impulses w/o bruits; no thyromegaly or nodules palpated; no lymphadenopathy. CHEST:  Clear to P & A; without wheezes/  rales/ or rhonchi. HEART:  Regular Rhythm; without murmurs/ rubs/ or gallops. ABDOMEN:  soft & nontender, w/ sl panniculus, normal bowel sounds; no organomegaly or masses detected. EXT: without deformities, mild arthritic changes; no varicose veins/ venous insuffic/ or edema. Left shoulder w/ sl decr ROM and tender on palp... NEURO:  CN's intact;  no focal neuro deficits... DERM:  No lesions noted; no rash etc...     Impression & Recommendations:  Problem # 1:  HYPERTENSION (ICD-401.9) Controlled-  same meds. Her updated medication list for this problem includes:    Norvasc 5 Mg Tabs (Amlodipine besylate) .Marland Kitchen... 1 tab daily    Lisinopril 20 Mg Tabs (Lisinopril) .Marland Kitchen... 1 tab daily  Orders: Venipuncture (04540) T-Vitamin D (25-Hydroxy) (98119-14782) TLB-Lipid Panel (80061-LIPID) TLB-BMP (Basic Metabolic Panel-BMET) (80048-METABOL) TLB-Hepatic/Liver  Function Pnl (80076-HEPATIC) TLB-A1C / Hgb A1C (Glycohemoglobin) (83036-A1C)  Problem # 2:  HYPERCHOLESTEROLEMIA (ICD-272.0) Stabler on the Simva20... continue same. Her updated medication list for this problem includes:    Simvastatin 20 Mg Tabs (Simvastatin) .Marland Kitchen... 1 tab daily at bedtime  Problem # 3:  DIABETES MELLITUS, TYPE II, UNCONTROLLED (ICD-250.02) Control is poor... we have stressed diet + exercise... now we need to incr the Lantus until the FBS is 100-120 range... Her updated medication list for this problem includes:    Aspirin 81 Mg Tbec (Aspirin) .Marland Kitchen... 1 tab daily    Lisinopril 20 Mg Tabs (Lisinopril) .Marland Kitchen... 1 tab daily    Lantus Solostar 100 Unit/ml Soln (Insulin glargine) .Marland Kitchen... 25 units subcutaneously daily    Metformin Hcl 1000 Mg Tabs (Metformin hcl) .Marland Kitchen... 1 tab two times a day    Glimepiride 4 Mg Tabs (Glimepiride) .Marland Kitchen... 1 tab daily  Problem # 4:  OBESITY (ICD-278.00) Getting her weight down is the key...  Problem # 5:  MULT OTHER MEDICAL PROBLEMS--- As noted...  Complete Medication List: 1)  Aspirin 81 Mg Tbec  (Aspirin) .Marland Kitchen.. 1 tab daily 2)  Norvasc 5 Mg Tabs (Amlodipine besylate) .Marland Kitchen.. 1 tab daily 3)  Lisinopril 20 Mg Tabs (Lisinopril) .Marland Kitchen.. 1 tab daily 4)  Simvastatin 20 Mg Tabs (Simvastatin) .Marland Kitchen.. 1 tab daily at bedtime 5)  Lantus Solostar 100 Unit/ml Soln (Insulin glargine) .... 25 units subcutaneously daily 6)  Metformin Hcl 1000 Mg Tabs (Metformin hcl) .Marland Kitchen.. 1 tab two times a day 7)  Glimepiride 4 Mg Tabs (Glimepiride) .Marland Kitchen.. 1 tab daily 8)  Calcium-vitamin D 500-125 Mg-unit Tabs (Calcium-vitamin d) .Marland Kitchen.. 1 tab two times a day 9)  Multiple Vitamins-minerals Tabs (Multiple vitamins-minerals) .Marland Kitchen.. 1 tab daily 10)  Vicodin 5-500 Mg Tabs (Hydrocodone-acetaminophen) .... 1/2 -1 by mouth every 4 hr as needed 11)  Etodolac 400 Mg Tabs (Etodolac) .... Take 1 tab by mouth two times a day w/ food as needed for shoulder pain...  Patient Instructions: 1)  Today we updated your med list- see below.... 2)  Today we decided to increase your LANTUS insulin to 25 u daily... continue to monitor your sugars... 3)  We also did your follow up blood work... please call the "phone tree" in a few days for your lab results.Marland KitchenMarland Kitchen 4)  Sinead, you must be more successful w/ your diet & exercise program... the bottom line is that we must lose weight!!! 5)  Call for any problems.Marland KitchenMarland Kitchen 6)  Please schedule a follow-up appointment in 6 months, sooner as needed.Marland KitchenMarland Kitchen

## 2010-05-14 NOTE — Progress Notes (Signed)
Summary: rx req/ pt's spouse passed  Phone Note Call from Patient   Caller: Daughter Call For: Whitney [name] Summary of Call: pt's daughter states that pt's husband just passed. funeral is sunday. daughter erika is watching pt\'s blood sugar levels and taking care that pt stays well. but says pt needs something to help with her nerves. daughter says she has some of her "own" but doesn\'t want to give her mom anything. would like dr Whitney [name] to look at pt\'s meds/ diabetes hx, etc and call something in for pt to have today please if possible. [facility] [address] erika [phone] Initial call taken by: Kathleen Perdue,  October 04, 2008 10:17 AM  Follow-up for Phone Call        Please advise what to give pt; not currently on anything for nerves. [name] Welchel CMA  October 04, 2008 10:27 AM    per SN--ok to give pt alprazolam 0.5mg   1 by mouth three times a day as needed #90  with 1 refill---thanks Leigh Whitley CMA  October 04, 2008 10:33 AM   Additional Follow-up for Phone Call Additional follow up Details #1::        Left message on daughter\'s  phone that RX was sent. If any questions or concerns please call our office and her family is in our thoughts. [name] Welchel CMA  October 04, 2008 10:48 AM     New/Updated Medications: ALPRAZOLAM 0.5 MG TABS (ALPRAZOLAM) take 1 by mouth three times a day as needed   Prescriptions: ALPRAZOLAM 0.5 MG TABS (ALPRAZOLAM) take 1 by mouth three times a day as needed  #90 x 1   Entered by:   [name] Welchel CMA   Authorized by:   Scott M Nadel MD   Signed by:   [name] Welchel CMA on 10/04/2008   Method used:   Telephoned to ...       [id number] (retail)       [address]       [place], [place]  [address]       Ph: [phone]       Fax: [phone]   RxID:   [id number]

## 2010-05-14 NOTE — Medication Information (Signed)
Summary: [facility]  [facility]   Imported By: Sherian Rein 12/18/2008 12:01:06  _____________________________________________________________________  External Attachment:    Type:   Image     Comment:   External Document

## 2010-05-14 NOTE — Consult Note (Signed)
Summary: [facility]  [facility] [place]   Imported By: Lanelle Bal 03/22/2008 09:33:11  _____________________________________________________________________  External Attachment:    Type:   Image     Comment:   External Document

## 2010-05-14 NOTE — Assessment & Plan Note (Signed)
Summary: f/u ---ok per Leigh  // cj   CC:  3 month ROV & ER follow up visit....  History of Present Illness: 76 y/o BF here for a follow up visit... Whitney [name] has mult med problems as noted below...     ~  02/21/2009:  her husb passed away 6/10- incr stress, Whitney [name] is starting to recover... 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 not really on a specific diet & exercise consists of some walking... advised to follow a strict low carb/ diabetic diet & increase exercise program at a gym, the Y, etc... Whitney [name] notes some right knee discomfort which Whitney [name] believes is from a Baker's cyst prev diagnosed by DrMurphy Antony Haste... Whitney [name] notes BP well controlled on home checks and FBS all in the 120-140 range on Lantus 20 (Whitney [name] never increased the dose as suggested after her last OV w/ A1c=7.8.Marland Kitchen.   ~  May 13, 2009:  Whitney [name] went to the ER 05/02/09 w/ HBP= 170-180/ 90-100 range... Whitney [name] was asymptomatic, just concerned- they checked EKG= NSR, NSSTTWA, no change;  and BMet= OK x BS=239... they incr her Norvasc to 10mg /d & f/u here>> improved but still 140/88 today & we will change her Lisinopril to include HCT... weight w/o improvement despite diet counselling etc... Whitney [name] incr Lantus to 26 & notes FBS betw 117-145 at home... recent pain at right lat malleolus w/ eval by Podiatry & DrTuckman Rx w/ Keflex, did XRays & lab work- pending.    Current Problem List:  HYPERTENSION (ICD-401.9) - on NORVASC 10mg /d  & LISINOPRIL 20mg /d... takes regularly and tol well... BP=140/88 today and similar at home Whitney [name] says... denies HA, fatigue, visual changes, CP, palipit, dizziness, syncope, dyspnea, edema, etc...  ~  1/11:  went to ER w/ incr BP readings 170-180/ 90-100 & Norvasc incr to 10mg /d...  ~  2/11:  BP 140/88 & we decided to change the Lisinopril20 to LISINOPRIL/ HCT 20-12.5 daily...  CHEST PAIN (ICD-786.50) - on ASA 81mg /d... Atypical CP eval 11/06 by Nunzio Cory-  EKG w/ NSSTTWA, & NuclearStressTest neg- no ischemia or infarct, EF=70%... no recent symptoms... Whitney [name]'s been too sedentary but promises to walk and increase her treadmill times...  HYPERCHOLESTEROLEMIA (ICD-272.0) - on SIMVASTATIN 20mg /d... Whitney [name] tries to follow a low carb, low fat diet...  ~  baseline FLP 5/04 on diet alone TChol 270, TG 108, HDL 35, LDL 214...  ~  FLP 9/08 on Simvastatin 20mg /d showed TChol 150, TG 92, HDL 36, LDL 95...  ~  FLP 1/09 showed TChol 137, TG 89, HDL 32, LDL 87... rec- continue the Simva20.  ~  FLP 11/09 showed TChol 124, TG 88, HDL 33, LDL 74  ~  FLP 5/10 showed TChol 124, TG 69, HDL 34, LDL 77  ~  FLP 11/10 showeed TChol 141, TG 87, HDL 36, LDL 88  DIABETES MELLITUS, TYPE II, UNCONTROLLED (ICD-250.02) - prev eval for endocrine by DrKrege... takes now LANTUS 26u daily, plus METFORMIN 1000mg Bid & GLIMEPERIDE 4mg /d... Whitney [name]'s been trying to follow diet, incr exercise & lose wt.  ~  labs 9/08 showed BS=172 and HgA1c=7.5.Marland KitchenMarland Kitchen Whitney [name] never increased her Lantus, but states her BS at home improved to 100-120 range...  ~  labs 1/09 showed BS= 132,  HgA1c= 6.8.Marland Kitchen.  ~  labs 5/09 showed BS= 142, HgA1c= 7.0.Marland KitchenMarland Kitchen rec sl incr Lantus til FBS ~ 100-110 (Whitney [name] didn't do this).  ~  labs 11/09 showed  BS= 109, A1c= 7.2  ~  labs 5/10 showed BS= 161, A1c= 7.8.Marland Kitchen. rec> incr Lantus slowly til FBS 100-120 (Whitney [name] never did).  ~  labs 11/10 showed BS= 155, A1c= 8.0.Marland Kitchen. rec> incr Lantus to 26 now & titrate up.  ~  labs 2/11 showed A1c= 7.7.Marland Kitchen. rec> incr Lantus to 30.  OBESITY (ICD-278.00) - 192#... 5' tall... BMI= 36-37 now... Whitney [name]'s getting dietary help at [facility]...  GERD (ICD-530.81) - uses PRILOSEC 20mg /d as needed... no prev EGD.  DIVERTICULOSIS, COLON (ICD-562.10) - last colnoscopy 3/05 by DrSam w/ divertics only, f/u planned 38yrs.  DEGENERATIVE JOINT DISEASE, KNEE (ICD-715.96) - eval 2006 by DrDraper... Rx NSAID's as needed...   ~  11/09 c/o left shoulder pain... rec ~ try Etodolac and refer to  DrSypher- PT helped.  BAKER'S CYST (ICD-727.51)  SPONDYLOSIS, LUMBAR (ICD-721.3) - eval by DrYates 1990 w/ conservative rx rec...  DISC DISEASE, CERVICAL (ICD-722.4)  OSTEOPENIA (ICD-733.90) - BMD 2004 w/ TScore -1.7 in spine...  ~  BMD repeat 5/09 was WNL w/ TScores +0.0 in Spine,  &  +0.8 in right FemNeck...  FACIAL PAIN, ATYPICAL (ICD-350.2) - eval 2001 by neuro/ DrSchmidt and resolved...  ANXIETY (ICD-300.00) - Whitney [name] has ALPRAZOLAM 0.5mg  to use Prn...  ONYCHOMYCOSIS (ICD-110.1) - eval by Podiatry & foot care clinic in the past...  ~  2/11: Whitney [name] reports recent eval by DrTuckman for pain right lat malleolus- Rx Keflex, notes pending...  NOTE:  I attempted to confirm med refills & compliance w/ her Pharm- Whitney [name] actually uses 3 pharmacies and can't tell me why? [facility], [facility] [place], & [facility] pharm as well...  ~  Immunizations:  hjad 2010 Flu vaccine today (11/10)... had TETANUS shot 2004... PNEUMOVAX in 2000.    Allergies (verified): No Known Drug Allergies  Comments:  Nurse/Medical Assistant: The patient's medications and allergies were reviewed with the patient and were updated in the Medication and Allergy Lists.  Past History:  Past Medical History:  HYPERTENSION (ICD-401.9) CHEST PAIN (ICD-786.50) HYPERCHOLESTEROLEMIA (ICD-272.0) DIABETES MELLITUS, TYPE II, UNCONTROLLED (ICD-250.02) OBESITY (ICD-278.00) GERD (ICD-530.81) DIVERTICULOSIS, COLON (ICD-562.10) DEGENERATIVE JOINT DISEASE, KNEE (ICD-715.96) BAKER'S CYST (ICD-727.51) SPONDYLOSIS, LUMBAR (ICD-721.3) DISC DISEASE, CERVICAL (ICD-722.4) OSTEOPENIA (ICD-733.90) FACIAL PAIN, ATYPICAL (ICD-350.2) ANXIETY (ICD-300.00) ONYCHOMYCOSIS (ICD-110.1)  Past Surgical History: Hysterectomy - S/P Vag Hyst & AP repair by Hulda Humphrey 2002  Family History: Reviewed history from 02/17/2009 and no changes required. Father died unknown age from an MI Mother died age 14 from a stroke 2 Siblings: both brothers &  both died from strokes, 1 was diabetic.  Social History: Reviewed history from 02/17/2009 and no changes required. Widow- husb passed 6/10 Never smoked No alcohol  Review of Systems      See HPI       The patient complains of dyspnea on exertion and difficulty walking.  The patient denies anorexia, fever, weight loss, weight gain, vision loss, decreased hearing, hoarseness, chest pain, syncope, peripheral edema, prolonged cough, headaches, hemoptysis, abdominal pain, melena, hematochezia, severe indigestion/heartburn, hematuria, incontinence, muscle weakness, suspicious skin lesions, transient blindness, depression, unusual weight change, abnormal bleeding, enlarged lymph nodes, and angioedema.    Vital Signs:  Patient profile:   76 year old female Height:      60 inches Weight:      191.38 pounds BMI:     37.51 O2 Sat:      98 % on Room air Temp:     97.7 degrees F oral Pulse rate:   76 / minute BP sitting:  140 / 88  (left arm) Cuff size:   regular  Vitals Entered By: Randell Loop CMA (May 13, 2009 9:19 AM)  O2 Sat at Rest %:  98 O2 Flow:  Room air CC: 3 month ROV & ER follow up visit... Is Patient Diabetic? Yes Did you bring your meter with you today? Yes Comments pt brought all meds this am---no changes in meds--has new rx for norvasc 10mg  but Whitney [name] has not started this yet   Physical Exam  Additional Exam:  WD, Overwt, 76 y/o BF in NAD... GENERAL:  Alert & oriented; pleasant & cooperative... HEENT:  [place]/AT, EOM-wnl, PERRLA, EACs-clear, TMs-wnl, NOSE-clear, THROAT-clear & wnl. NECK:  Supple w/ fairROM; no JVD; normal carotid impulses w/o bruits; no thyromegaly or nodules palpated; no lymphadenopathy. CHEST:  Clear to P & A; without wheezes/ rales/ or rhonchi. HEART:  Regular Rhythm; without murmurs/ rubs/ or gallops. ABDOMEN:  Soft & nontender, w/ +panniculus, normal bowel sounds; no organomegaly or masses detected. EXT: without deformities, mild arthritic changes;  no varicose veins/ venous insuffic/ or edema. NEURO:  CN's intact;  no focal neuro deficits... DERM:  No lesions noted; no rash etc...     MISC. Report  Procedure date:  05/13/2009  Findings:      Hemoglobin A1C (A1C)   Hemoglobin A1C       [H]  7.7 %                       4.6-6.5  Whitney [name] reports recent right foot XRay & blood work at [facility] office>>> results pending...    Impression & Recommendations:  Problem # 1:  HYPERTENSION (ICD-401.9) We discussed incr the Lisinopril to the HCT variety as noted... continue to monitor BP... wy reduction is critical!!! Her updated medication list for this problem includes:    Amlodipine Besylate 10 Mg Tabs (Amlodipine besylate) .Marland Kitchen... Take 1 tab by mouth once daily...    Lisinopril-hydrochlorothiazide 20-12.5 Mg Tabs (Lisinopril-hydrochlorothiazide) .Marland Kitchen... Take 1 tab by mouth once daily...  Problem # 2:  HYPERCHOLESTEROLEMIA (ICD-272.0) Stable on Simva20... needs better diet... Her updated medication list for this problem includes:    Simvastatin 20 Mg Tabs (Simvastatin) .Marland Kitchen... 1 tab daily at bedtime  Problem # 3:  DIABETES MELLITUS, TYPE II, UNCONTROLLED (ICD-250.02) Controll isn't there yet... Whitney [name] needs better diet + exercise... get weight down... rec incr the Lantus to 30... Her updated medication list for this problem includes:    Aspirin 81 Mg Tbec (Aspirin) .Marland Kitchen... 1 tab daily    Lisinopril-hydrochlorothiazide 20-12.5 Mg Tabs (Lisinopril-hydrochlorothiazide) .Marland Kitchen... Take 1 tab by mouth once daily...    Lantus Solostar 100 Unit/ml Soln (Insulin glargine) .Marland Kitchen... 26 units subcutaneously daily    Metformin Hcl 1000 Mg Tabs (Metformin hcl) .Marland Kitchen... 1 tab two times a day    Glimepiride 4 Mg Tabs (Glimepiride) .Marland Kitchen... 1 tab daily  Orders: TLB-A1C / Hgb A1C (Glycohemoglobin) (83036-A1C)  Problem # 4:  OBESITY (ICD-278.00) Weight loss is the key...  Problem # 5:  DEGENERATIVE JOINT DISEASE, KNEE (ICD-715.96) Continue exercise program... Her  updated medication list for this problem includes:    Aspirin 81 Mg Tbec (Aspirin) .Marland Kitchen... 1 tab daily    Vicodin 5-500 Mg Tabs (Hydrocodone-acetaminophen) .Marland Kitchen... 1/2 -1 by mouth every 4 hr as needed    Etodolac 400 Mg Tabs (Etodolac) .Marland Kitchen... Take 1 tab by mouth two times a day w/ food as needed for shoulder pain...  Problem # 6:  OTHER MEDICAL PROBLEMS AS NOTED>>>  Complete Medication List: 1)  Aspirin 81 Mg Tbec (Aspirin) .Marland Kitchen.. 1 tab daily 2)  Amlodipine Besylate 10 Mg Tabs (Amlodipine besylate) .... Take 1 tab by mouth once daily.Marland KitchenMarland Kitchen 3)  Lisinopril-hydrochlorothiazide 20-12.5 Mg Tabs (Lisinopril-hydrochlorothiazide) .... Take 1 tab by mouth once daily.Marland KitchenMarland Kitchen 4)  Simvastatin 20 Mg Tabs (Simvastatin) .Marland Kitchen.. 1 tab daily at bedtime 5)  Lantus Solostar 100 Unit/ml Soln (Insulin glargine) .... 26 units subcutaneously daily 6)  Metformin Hcl 1000 Mg Tabs (Metformin hcl) .Marland Kitchen.. 1 tab two times a day 7)  Glimepiride 4 Mg Tabs (Glimepiride) .Marland Kitchen.. 1 tab daily 8)  Calcium-vitamin D 500-125 Mg-unit Tabs (Calcium-vitamin d) .Marland Kitchen.. 1 tab two times a day 9)  Multiple Vitamins-minerals Tabs (Multiple vitamins-minerals) .Marland Kitchen.. 1 tab daily 10)  Vicodin 5-500 Mg Tabs (Hydrocodone-acetaminophen) .... 1/2 -1 by mouth every 4 hr as needed 11)  Etodolac 400 Mg Tabs (Etodolac) .... Take 1 tab by mouth two times a day w/ food as needed for shoulder pain... 12)  Alprazolam 0.5 Mg Tabs (Alprazolam) .... Take 1 by mouth three times a day as needed 13)  Bd Insulin Syringe Ultrafine 30g X 1/2" 1 Ml Misc (Insulin syringe-needle u-100) .... Use as directed  Other Orders: Prescription Created Electronically [id number])  Patient Instructions: 1)  Today we updated your med list- see below.... 2)  Today we increased your Norvasc/ AMLODIPINE to 10mg /d; and changed the LISINOPRIL to Lisinopril/ HCT to help your Blood Pressure... 3)  We also did an A1c test to check your blood sugar control... please call the "phone tree" in a few days for your lab  results.... 4)  [place]-  diet + exercise are key, and getting your weight down is critically important!!! 5)  Call for any questions.Marland KitchenMarland Kitchen  6)  Please schedule a follow-up appointment in 3-4 months, sooner as needed. Prescriptions: LISINOPRIL-HYDROCHLOROTHIAZIDE 20-12.5 MG TABS (LISINOPRIL-HYDROCHLOROTHIAZIDE) take 1 tab by mouth once daily...  #30 x prn   Entered and Authorized by:   Michele Mcalpine MD   Signed by:   Michele Mcalpine MD on 05/13/2009   Method used:   Print then Give to Patient   RxID:   [id number] AMLODIPINE BESYLATE 10 MG TABS (AMLODIPINE BESYLATE) take 1 tab by mouth once daily...  #30 x prn   Entered and Authorized by:   Michele Mcalpine MD   Signed by:   Michele Mcalpine MD on 05/13/2009   Method used:   Print then Give to Patient   RxID:   [id number]

## 2010-05-14 NOTE — Assessment & Plan Note (Signed)
Summary: 6 months/apc   CC:  3 month ROV & review mult medical problems....  History of Present Illness: 76 y/o BF here for a follow up visit... she has mult med problems as noted below...     ~  02/18/2009:  her husb passed away 6/10- incr stress, she is starting to recover... 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 not really on a specific diet & exercise consists of some walking... advised to follow a strict low carb/ diabetic diet & increase exercise program at a gym, the Y, etc... she notes some right knee discomfort which she believes is from a Baker's cyst prev diagnosed by DrMurphy eHarriett Sine.. she notes BP well controlled on home checks and FBS all in the 120-140 range on Lantus 20 (she never increased the dose as suggested after her last OV w/ A1c=7.8..Marland Kitchen   ~  May 13, 2009:  she went to the ER 05/02/09 w/ HBP= 170-180/ 90-100 range... she was asymptomatic, just concerned- they checked EKG= NSR, NSSTTWA, no change;  and BMet= OK x BS=239... they incr her Norvasc to '10mg'$ /d & f/u here>> improved but still 140/88 today & we will change her Lisinopril to include HCT... weight w/o improvement despite diet counselling etc... she incr Lantus to 26 & notes FBS betw 117-145 at home... recent pain at right lat malleolus w/ eval by Podiatry & DrTuckman Rx w/ Keflex, did XRays & lab work- pending.   ~  Aug 18, 2009:  she's feeling better, [facility] & [facility] 2# to 189#... she notes home BS betw 70-190 on 30u Lantus, etc... BP controlled on meds;  Chol OK on the Simva20;  DM control improved w/ A1c down to 7.3 now...   Current Problem List:  GLAUCOMA (ICD-365.9) - eval by drGroat & Rx w/ Travatan eye drops...  HYPERTENSION (ICD-401.9) - on NORVASC '10mg'$ /d  & LISINOPRIL/HCT 20-12.5 daily... takes regularly and tol well... BP=130/68 today and similar at home she says... denies HA, fatigue,  visual changes, CP, palipit, dizziness, syncope, dyspnea, edema, etc...  ~  1/11:  went to ER w/ incr BP readings 170-180/ 90-100 & Norvasc incr to '10mg'$ /d...  ~  2/11:  BP 140/88 & we decided to change the Lisinopril20 to LISINOPRIL/ HCT 20-12.5 daily...  CHEST PAIN (ICD-786.50) - on ASA '81mg'$ /d... Atypical CP eval 11/06 by DSol Passer EKG w/ NSSTTWA, & NuclearStressTest neg- no ischemia or infarct, EF=70%... no recent symptoms... she's been too sedentary but promises to walk and increase her treadmill times...  HYPERCHOLESTEROLEMIA (ICD-272.0) - on SIMVASTATIN '20mg'$ /d... she tries to follow a low carb, low fat diet...  ~  baseline FLP 5/04 on diet alone TChol 270, TG 108, HDL 35, LDL 214...  ~  [place]9/08 on Simvastatin '20mg'$ /d showed TChol 150, TG 92, HDL 36, LDL 95...  ~  FPennwyn1/09 showed TChol 137, TG 89, HDL 32, LDL 87... rec- continue the Simva20.  ~  FPalmas11/09 showed TChol 124, TG 88, HDL 33, LDL 74  ~  FLP 5/10 showed TChol 124, TG 69, HDL 34, LDL 77  ~  FLP 11/10 showed TChol 141, TG 87, HDL 36, LDL 88  ~  FLP 5/11 showed TChol 141, TG 100, HDL 38, LDL 84  DIABETES MELLITUS, TYPE II, UNCONTROLLED (ICD-250.02) - prev eval for endocrine by DrKrege... takes now LANTUS 26u daily, plus METFORMIN '1000mg'$ Bid &  GLIMEPERIDE '4mg'$ /d... she's been trying to follow diet, incr exercise & lose wt.  ~  labs 9/08 showed BS=172 and HgA1c=7.5.Marland KitchenMarland Kitchen she never increased her Lantus, but states her BS at home improved to 100-120 range...  ~  labs 1/09 showed BS= 132,  HgA1c= 6.8.Marland Kitchen.  ~  labs 5/09 showed BS= 142, HgA1c= 7.0.Marland KitchenMarland Kitchen rec sl incr Lantus til FBS ~ 100-110 (she didn't do this).  ~  labs 11/09 showed BS= 109, A1c= 7.2  ~  labs 5/10 showed BS= 161, A1c= 7.8.Marland Kitchen. rec> incr Lantus slowly til FBS 100-120 (she never did).  ~  labs 11/10 showed BS= 155, A1c= 8.0.Marland Kitchen. rec> incr Lantus to 26 now & titrate up.  ~  labs 2/11 showed A1c= 7.7.Marland Kitchen. rec> incr Lantus to 30.  ~  labs 5/11 showed BS= 142, A1c= 7.3.Marland KitchenMarland Kitchen continue same, get wt  down.  OBESITY (ICD-278.00) - 189#... 5' tall... BMI= 36 now... she's getting dietary help at [facility]...  GERD (ICD-530.81) - uses PRILOSEC '20mg'$ /d as needed... no prev EGD.  DIVERTICULOSIS, COLON (ICD-562.10) - last colnoscopy 3/05 by DrSam w/ divertics only, f/u planned 22yr... she has some mild constipation & uses "smooth move"...  DEGENERATIVE JOINT DISEASE, KNEE (ICD-715.96) - eval 2006 by DrDraper... Rx NSAID's as needed...   ~  11/09 c/o left shoulder pain... rec ~ try Etodolac and refer to DrSypher- PT helped.  BAKER'S CYST (ICD-727.51)  SPONDYLOSIS, LUMBAR (ICD-721.3) - eval by DrYates 1990 w/ conservative rx rec...  DISC DISEASE, CERVICAL (ICD-722.4)  OSTEOPENIA (ICD-733.90) - BMD 2004 w/ TScore -1.7 in spine...  ~  BMD repeat 5/09 was WNL w/ TScores +0.0 in Spine,  &  +0.8 in right FemNeck...  ~  labs 5/11 showed Vit D level = 40... continue MVI, Calcium, etc...  FACIAL PAIN, ATYPICAL (ICD-350.2) - eval 2001 by neuro/ DrSchmidt and resolved...  ANXIETY (ICD-300.00) - she has ALPRAZOLAM 0.'5mg'$  to use Prn...  ONYCHOMYCOSIS (ICD-110.1) - eval by Podiatry & foot care clinic in the past...  ~  2/11: she reports recent eval by DrTuckman for pain right lat malleolus- Rx Keflex, notes pending...  NOTE:  I attempted to confirm med refills & compliance w/ her Pharm- she actually uses 3 pharmacies and can't tell me why? KAnthony Sar WVeblenas well...  ~  Immunizations:  had 2010 Flu vaccine today (11/10)... had TETANUS shot 2004... PNEUMOVAX in 2000.   Allergies (verified): No Known Drug Allergies  Comments:  Nurse/Medical Assistant: The patient's medications and allergies were reviewed with the patient and were updated in the Medication and Allergy Lists.  Past History:  Past Medical History: GLAUCOMA (ICD-365.9) HYPERTENSION (ICD-401.9) CHEST PAIN (ICD-786.50) HYPERCHOLESTEROLEMIA (ICD-272.0) DIABETES MELLITUS, TYPE II,  UNCONTROLLED (ICD-250.02) OBESITY (ICD-278.00) GERD (ICD-530.81) DIVERTICULOSIS, COLON (ICD-562.10) DEGENERATIVE JOINT DISEASE, KNEE (ICD-715.96) BAKER'S CYST (ICD-727.51) SPONDYLOSIS, LUMBAR (ICD-721.3) DISC DISEASE, CERVICAL (ICD-722.4) OSTEOPENIA (ICD-733.90) FACIAL PAIN, ATYPICAL (ICD-350.2) ANXIETY (ICD-300.00) ONYCHOMYCOSIS (ICD-110.1)  Past Surgical History: Hysterectomy - S/P Vag Hyst & AP repair by DrHenley 2002  Family History: Reviewed history from 02/17/2009 and no changes required. Father died unknown age from an MI Mother died age 626from a stroke 2 Siblings: both brothers & both died from strokes, 1 was diabetic.  Social History: Reviewed history from 02/17/2009 and no changes required. Widow- husb passed 6/10 Never smoked No alcohol  Review of Systems      See HPI       The patient complains of dyspnea on exertion.  The patient denies anorexia, fever,  weight loss, weight gain, vision loss, decreased hearing, hoarseness, chest pain, syncope, peripheral edema, prolonged cough, headaches, hemoptysis, abdominal pain, melena, hematochezia, severe indigestion/heartburn, hematuria, incontinence, muscle weakness, suspicious skin lesions, transient blindness, difficulty walking, depression, unusual weight change, abnormal bleeding, enlarged lymph nodes, and angioedema.    Vital Signs:  Patient profile:   76 year old female Height:      60 inches Weight:      189 pounds BMI:     37.04 O2 Sat:      98 % on Room air Temp:     97.0 degrees F oral Pulse rate:   71 / minute BP sitting:   130 / 68  (left arm) Cuff size:   regular  Vitals Entered By: Elita Boone CMA (Aug 18, 2009 10:28 AM)  O2 Sat at Rest %:  98 O2 Flow:  Room air CC: 3 month ROV & review mult medical problems... Is Patient Diabetic? Yes Pain Assessment Patient in pain? no      Comments PT BROUGHT ALL OF HER MEDS TODAY   Physical Exam  Additional Exam:  WD, Overwt, 76 y/o BF in  NAD... GENERAL:  Alert & oriented; pleasant & cooperative... HEENT:  [place]/AT, EOM-wnl, PERRLA, EACs-clear, TMs-wnl, NOSE-clear, THROAT-clear & wnl. NECK:  Supple w/ fairROM; no JVD; normal carotid impulses w/o bruits; no thyromegaly or nodules palpated; no lymphadenopathy. CHEST:  Clear to P & A; without wheezes/ rales/ or rhonchi. HEART:  Regular Rhythm; without murmurs/ rubs/ or gallops. ABDOMEN:  Soft & nontender, w/ +panniculus, normal bowel sounds; no organomegaly or masses detected. EXT: without deformities, mild arthritic changes; no varicose veins/ venous insuffic/ or edema. NEURO:  CN's intact;  no focal neuro deficits... DERM:  No lesions noted; no rash etc...    MISC. Report  Procedure date:  08/18/2009  Findings:      BMP (METABOL)   Sodium                    142 mEq/L                   135-145   Potassium                 4.1 mEq/L                   3.5-5.1   Chloride                  105 mEq/L                   96-112   Carbon Dioxide            30 mEq/L                    19-32   Glucose              [H]  142 mg/dL                   70-99   BUN                       12 mg/dL                    6-23   Creatinine                0.6 mg/dL  0.4-1.2   Calcium                   9.8 mg/dL                   8.4-10.5   GFR                       106.33 mL/min               >60  Hemoglobin A1C (A1C)   Hemoglobin A1C       [H]  7.3 %                       4.6-6.5  Hepatic/Liver Function Panel (HEPATIC)   Total Bilirubin      [H]  1.4 mg/dL                   0.3-1.2   Direct Bilirubin          0.2 mg/dL                   0.0-0.3   Alkaline Phosphatase      48 U/L                      39-117   AST                  [H]  43 U/L                      0-37   ALT                  [H]  49 U/L                      0-35   Total Protein             7.4 g/dL                    6.0-8.3   Albumin                   4.0 g/dL                    3.5-5.2  CBC Platelet w/Diff  (CBCD)   White Cell Count          5.3 K/uL                    4.5-10.5   Red Cell Count            4.92 Mil/uL                 3.87-5.11   Hemoglobin                13.5 g/dL                   12.0-15.0   Hematocrit                39.9 %                      36.0-46.0   MCV                       81.0 fl  78.0-100.0   Platelet Count            229.0 K/uL                  150.0-400.0   Neutrophil %              55.3 %                      43.0-77.0   Lymphocyte %              35.1 %                      12.0-46.0   Monocyte %                7.1 %                       3.0-12.0   Eosinophils%              1.9 %                       0.0-5.0   Basophils %               0.6 %                       0.0-3.0  Comments:      Lipid Panel (LIPID)   Cholesterol               141 mg/dL                   0-200   Triglycerides             100.0 mg/dL                 0.0-149.0   HDL                  [L]  37.50 mg/dL                 >39.00   LDL Cholesterol           84 mg/dL                    0-99  TSH (TSH)   FastTSH                   0.86 uIU/mL                 0.35-5.50  Vitamin D (25-Hydroxy) [id number])  Vitamin D (25-Hydroxy)                             40 ng/mL                    30-89   Impression & Recommendations:  Problem # 1:  GLAUCOMA (ICD-365.9) Followed by drGroat...  Problem # 2:  HYPERTENSION (ICD-401.9) Controlled on meds>  continue same. Her updated medication list for this problem includes:    Amlodipine Besylate 10 Mg Tabs (Amlodipine besylate) .Marland Kitchen... Take 1 tab by mouth once daily...    Lisinopril-hydrochlorothiazide 20-12.5 Mg Tabs (Lisinopril-hydrochlorothiazide) .Marland Kitchen... Take 1 tab by mouth once daily...  Orders: TLB-BMP (Basic Metabolic Panel-BMET) (99991111) TLB-Hepatic/Liver Function Pnl (80076-HEPATIC) TLB-CBC Platelet - w/Differential (85025-CBCD) TLB-Lipid Panel (80061-LIPID) TLB-TSH (Thyroid Stimulating Hormone) (  84443-TSH) TLB-A1C / Hgb  A1C (Glycohemoglobin) (83036-A1C) T-Vitamin D (25-Hydroxy) [id number])  Problem # 3:  HYPERCHOLESTEROLEMIA (ICD-272.0) Doing satis on the simva20... needs better HDL & rec incr exercise. Her updated medication list for this problem includes:    Simvastatin 20 Mg Tabs (Simvastatin) .Marland Kitchen... 1 tab daily at bedtime  Problem # 4:  DIABETES MELLITUS, TYPE II, UNCONTROLLED (ICD-250.02) A1c is improvedat 7.3.Marland KitchenMarland Kitchen continue same meds but needs to lose weight!!! Her updated medication list for this problem includes:    Aspirin 81 Mg Tbec (Aspirin) .Marland Kitchen... 1 tab daily    Lisinopril-hydrochlorothiazide 20-12.5 Mg Tabs (Lisinopril-hydrochlorothiazide) .Marland Kitchen... Take 1 tab by mouth once daily...    Lantus Solostar 100 Unit/ml Soln (Insulin glargine) .Marland KitchenMarland KitchenMarland KitchenMarland Kitchen 30  units subcutaneously daily    Metformin Hcl 1000 Mg Tabs (Metformin hcl) .Marland Kitchen... 1 tab two times a day    Glimepiride 4 Mg Tabs (Glimepiride) .Marland Kitchen... 1 tab daily  Problem # 5:  GERD (ICD-530.81) GI is stable & up to date...  Problem # 6:  DEGENERATIVE JOINT DISEASE, KNEE (ICD-715.96) DJD stable-  needs incr exercise etc... The following medications were removed from the medication list:    Vicodin 5-500 Mg Tabs (Hydrocodone-acetaminophen) .Marland Kitchen... 1/2 -1 by mouth every 4 hr as needed Her updated medication list for this problem includes:    Aspirin 81 Mg Tbec (Aspirin) .Marland Kitchen... 1 tab daily    Etodolac 400 Mg Tabs (Etodolac) .Marland Kitchen... Take 1 tab by mouth two times a day w/ food as needed for shoulder pain...  Problem # 7:  OSTEOPENIA (ICD-733.90) She's on MVI w/ vit D, Cslcium, etc...  Problem # 8:  OTHER MEDICAL PROBLEMS AS NOTED>>>  Complete Medication List: 1)  Travatan Z 0.004 % Soln (Travoprost) .... Use one drop in each eye at bedtime 2)  Aspirin 81 Mg Tbec (Aspirin) .Marland Kitchen.. 1 tab daily 3)  Amlodipine Besylate 10 Mg Tabs (Amlodipine besylate) .... Take 1 tab by mouth once daily.Marland KitchenMarland Kitchen 4)  Lisinopril-hydrochlorothiazide 20-12.5 Mg Tabs  (Lisinopril-hydrochlorothiazide) .... Take 1 tab by mouth once daily.Marland KitchenMarland Kitchen 5)  Simvastatin 20 Mg Tabs (Simvastatin) .Marland Kitchen.. 1 tab daily at bedtime 6)  Lantus Solostar 100 Unit/ml Soln (Insulin glargine) .... 30  units subcutaneously daily 7)  Metformin Hcl 1000 Mg Tabs (Metformin hcl) .Marland Kitchen.. 1 tab two times a day 8)  Glimepiride 4 Mg Tabs (Glimepiride) .Marland Kitchen.. 1 tab daily 9)  Calcium-vitamin D 500-125 Mg-unit Tabs (Calcium-vitamin d) .Marland Kitchen.. 1 tab two times a day 10)  Multiple Vitamins-minerals Tabs (Multiple vitamins-minerals) .Marland Kitchen.. 1 tab daily 11)  Etodolac 400 Mg Tabs (Etodolac) .... Take 1 tab by mouth two times a day w/ food as needed for shoulder pain... 12)  Bd Insulin Syringe Ultrafine 30g X 1/2" 1 Ml Misc (Insulin syringe-needle u-100) .... Use as directed  Patient Instructions: 1)  Today we updated your med list- see below.... 2)  Continue your current medications the same for now... 3)  Today we did your follow up FASTING blood work... please call the "phone tree" in a few days for your lab results.Marland KitchenMarland Kitchen 4)  Keep up the good work w/ diet/ exercise/ & weight reduction.Marland KitchenMarland Kitchen 5)  Call for any problems.Marland KitchenMarland Kitchen 6)  Please schedule a follow-up appointment in 6 months.

## 2010-05-14 NOTE — Medication Information (Signed)
Summary: [facility]  [facility]   Imported By: Sherian Rein 01/29/2010 09:26:42  _____________________________________________________________________  External Attachment:    Type:   Image     Comment:   External Document

## 2010-05-14 NOTE — Letter (Signed)
Summary: [facility]  [facility]   Imported By: Sherian Rein 11/12/2009 07:46:13  _____________________________________________________________________  External Attachment:    Type:   Image     Comment:   External Document

## 2010-05-14 NOTE — Consult Note (Signed)
Summary: Diabetes Eye Exam/Dr Clarene Critchley   Diabetes Eye Exam/Dr Clarene Critchley   Imported By: Esmeralda Links D'jimraou 06/19/2007 13:44:07  _____________________________________________________________________  External Attachment:    Type:   Image     Comment:   External Document

## 2010-05-14 NOTE — Progress Notes (Signed)
Summary: b/p elevated  Phone Note Call from Patient   Caller: Patient Call For: nadel Summary of Call: pt's blood pressure elevated Initial call taken by: Rickard Patience,  May 05, 2009 9:07 AM  Follow-up for Phone Call        called, spoke with pt.  Pt states on Friday evening her BP was ranging from 176/98 - 214/117.  Pt states once it went to 214/117 she went to ER.  states she was told in ER to incr norvasc from 5mg  to 10mg  but she has not yet started the 10mg  because she wants SN's approval first.  BP currently 174/86.  Pt also c/o right ear pressure x3 wks.  Pt requesting SN's recs.  Will forward to SN-please advise.  Thanks!   Follow-up by: Gweneth Dimitri RN,  May 05, 2009 9:25 AM  Additional Follow-up for Phone Call Additional follow up Details #1::        per SN---yes increase the norvasc to 10mg  daily  and will need rov with SN in 7-10 days.   add pt on 2-01 at 9:30 please.  thanks Randell Loop CMA  May 05, 2009 10:55 AM     Additional Follow-up for Phone Call Additional follow up Details #2::    called, spoke with pt.  Pt informed to increase Norvasc to 10mg /day per SN and advised he wants to see her in 7-10 days.  She is ok with 05/13/09 at 9:30.  ov booked.  Pt verbalized understanding of instructions.   Follow-up by: Gweneth Dimitri RN,  May 05, 2009 11:11 AM

## 2010-05-14 NOTE — Assessment & Plan Note (Signed)
Summary: f/u 6 months///kp   CC:  6 month ROV & review of mult medical problems....  History of Present Illness: 76 y/o BF here for a follow up visit... Whitney [name] has mult med problems as noted below...     ~  Nov09:  c/o some pain in her left shoulder x 2 weeks... pain incr w/ movement & sl tender as well... denies trauma or motion/ activity that may have brought this on... no prev arthritic history or shoulder problems... Whitney [name] tried heat w/ some relief... referred to DrSypher- improved w/ Etodolac & PT...   ~  Aug 19, 2008:  Whitney [name] is trying to diet but gained 3# to 192# today... BS's are 140-180 fasting and we discussed increasing the Lantus dose til this is closer to 100-120... we had a frank discussion about the necessity of losing weight in this condition...   ~  February 22, 2009:  her husb passed away 6/10- incr stress, Whitney [name] is starting to recover... 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 not really on a specific diet & exercise consists of some walking... advised to follow a strict low carb/ diabetic diet & increase exercise program at a gym, the Y, etc... Whitney [name] notes some right knee discomfort which Whitney [name] believes is from a Baker's cyst prev diagnosed by DrMurphy Antony Haste... Whitney [name] notes BP well controlled on home checks and FBS all in the 120-140 range on Lantus 20 (Whitney [name] never increased the dose as suggested after her last OV w/ A1c=7.8.Marland KitchenMarland Kitchen    Current Problem List:  HYPERTENSION (ICD-401.9) - controlled on NORVASC 5mg /d  & LISINOPRIL 20mg /d... takes regularly and tol well... BP=138/70 today and similar at home Whitney [name] says... denies HA, fatigue, visual changes, CP, palipit, dizziness, syncope, dyspnea, edema, etc...  CHEST PAIN (ICD-786.50) - on ASA 81mg /d... Atypical CP eval 11/06 by Nunzio Cory- EKG w/ NSSTTWA, & NuclearStressTest neg- no ischemia or infarct, EF=70%... no recent symptoms... Whitney [name]'s been too sedentary but promises to walk and  increase her treadmill times...  HYPERCHOLESTEROLEMIA (ICD-272.0) - Whitney [name] tries to follow a low carb, low fat diet... on SIMVASTATIN 20mg /d... weight at 192# today...  ~  baseline FLP 5/04 on diet alone TChol 270, TG 108, HDL 35, LDL 214...  ~  FLP 9/08 on Simvastatin 20mg /d showed TChol 150, TG 92, HDL 36, LDL 95...  ~  FLP 1/09 showed TChol 137, TG 89, HDL 32, LDL 87... rec- continue the Simva20.  ~  FLP 11/09 showed TChol 124, TG 88, HDL 33, LDL 74  ~  FLP 5/10 showed TChol 124, TG 69, HDL 34, LDL 77  ~  FLP 11/10 showeed TChol 141, TG 87, HDL 36, LDL 88  DIABETES MELLITUS, TYPE II, UNCONTROLLED (ICD-250.02) - prev eval for endocrine by DrKrege... takes LANTUS 20u daily, plus METFORMIN 1000mg Bid & GLIMEPERIDE 4mg /d... Whitney [name]'s been trying to follow diet, incr exercise & lose weight!  ~  labs 9/08 showed BS=172 and HgA1c=7.5.Marland KitchenMarland Kitchen Whitney [name] never increased her Lantus, but states her BS at home improved to 100-120 range...  ~  labs 1/09 showed BS= 132,  HgA1c= 6.8.Marland Kitchen.  ~  labs 5/09 showed BS= 142, HgA1c= 7.0.Marland KitchenMarland Kitchen rec sl incr Lantus til FBS ~ 100-110 (Whitney [name] didn't do this).  ~  labs 11/09 showed BS= 109, A1c= 7.2  ~  labs 5/10 showed BS= 161, A1c= 7.8.Marland Kitchen. rec> incr Lantus slowly til FBS 100-120 (Whitney [name] never did).  ~  labs 11/10 showed BS=  155, A1c= 8.0.Marland Kitchen. rec> incr Lantus to 26 now & titrate up.  OBESITY (ICD-278.00) - 192#... 5' tall... BMI= 36-37 now... Whitney [name]'s getting dietary help at [facility]...  GERD (ICD-530.81) - uses PRILOSEC 20mg /d as needed... no prev EGD.  DIVERTICULOSIS, COLON (ICD-562.10) - last colnoscopy 3/05 by DrSam w/ divertics only, f/u planned 26yrs.  DEGENERATIVE JOINT DISEASE, KNEE (ICD-715.96) - eval 2006 by DrDraper... Rx NSAID's as needed...   ~  11/09 c/o left shoulder pain... rec ~ try Etodolac and refer to DrSypher- PT helped.  BAKER'S CYST (ICD-727.51)  SPONDYLOSIS, LUMBAR (ICD-721.3) - eval by DrYates 1990 w/ conservative rx rec...  DISC DISEASE, CERVICAL  (ICD-722.4)  OSTEOPENIA (ICD-733.90) - BMD Aug 29, 2002 w/ TScore -1.7 in spine...  ~  BMD repeat 5/09 was WNL w/ TScores +0.0 in Spine,  &  +0.8 in right FemNeck...  FACIAL PAIN, ATYPICAL (ICD-350.2) - eval 2001 by neuro/ DrSchmidt and resolved...  ANXIETY (ICD-300.00) - Whitney [name] has ALPRAZOLAM 0.5mg  to use Prn...  ONYCHOMYCOSIS (ICD-110.1) - eval by Podiatry [facility] foot care clinic in the past...  NOTE:  I attempted to confirm med refills & compliance w/ her Pharm- Whitney [name] actually uses 3 pharmacies and can't tell me why? [facility], [facility] [place], & [facility] as well...  ~  Immunizations:  OK 2008/08/28 Flu vaccine today (02/17/09)... has TETANUS shot 2002-08-29... PNEUMOVAX in 08-29-98.    Allergies (verified): No Known Drug Allergies  Comments:  Nurse/Medical Assistant: The patient's medications and allergies were reviewed with the patient and were updated in the Medication and Allergy Lists.  Past History:  Past Medical History: HYPERTENSION (ICD-401.9) CHEST PAIN (ICD-786.50) HYPERCHOLESTEROLEMIA (ICD-272.0) DIABETES MELLITUS, TYPE II, UNCONTROLLED (ICD-250.02) OBESITY (ICD-278.00) GERD (ICD-530.81) DIVERTICULOSIS, COLON (ICD-562.10) DEGENERATIVE JOINT DISEASE, KNEE (ICD-715.96) BAKER'S CYST (ICD-727.51) SPONDYLOSIS, LUMBAR (ICD-721.3) DISC DISEASE, CERVICAL (ICD-722.4) OSTEOPENIA (ICD-733.90) FACIAL PAIN, ATYPICAL (ICD-350.2) ANXIETY (ICD-300.00) ONYCHOMYCOSIS (ICD-110.1)  Past Surgical History: Hysterectomy - S/P Vag Hyst & AP repair by Hulda Humphrey 2000/08/28  Family History: Father died unknown age from an MI Mother died age 44 from a stroke 2 Siblings: both brothers & both died from strokes, 1 was diabetic.  Social History: Widow- husb passed 6/10 Never smoked No alcohol  Review of Systems      See HPI       The patient complains of dyspnea on exertion and difficulty walking.  The patient denies anorexia, fever, weight loss, weight gain, vision loss, decreased hearing, hoarseness,  chest pain, syncope, peripheral edema, prolonged cough, headaches, hemoptysis, abdominal pain, melena, hematochezia, severe indigestion/heartburn, hematuria, incontinence, muscle weakness, suspicious skin lesions, transient blindness, depression, unusual weight change, abnormal bleeding, enlarged lymph nodes, and angioedema.    Vital Signs:  Patient profile:   76 year old female Height:      60 inches Weight:      192.25 pounds BMI:     37.68 O2 Sat:      98 % on Room air Temp:     96.2 degrees F oral Pulse rate:   69 / minute BP sitting:   138 / 70  (right arm) Cuff size:   regular  Vitals Entered By: Marijo File CMA (February 17, 2009 9:54 AM)  O2 Sat at Rest %:  98 O2 Flow:  Room air CC: 6 month ROV & review of mult medical problems... Comments meds updated today---pt unsure about the alprazolam on her list   Physical Exam  Additional Exam:  WD, Overwt, 76 y/o BF in NAD... GENERAL:  Alert & oriented; pleasant &  cooperative... HEENT:  [place]/AT, EOM-wnl, PERRLA, EACs-clear, TMs-wnl, NOSE-clear, THROAT-clear & wnl. NECK:  Supple w/ full ROM; no JVD; normal carotid impulses w/o bruits; no thyromegaly or nodules palpated; no lymphadenopathy. CHEST:  Clear to P & A; without wheezes/ rales/ or rhonchi. HEART:  Regular Rhythm; without murmurs/ rubs/ or gallops. ABDOMEN:  soft & nontender, w/ +panniculus, normal bowel sounds; no organomegaly or masses detected. EXT: without deformities, mild arthritic changes; no varicose veins/ venous insuffic/ or edema. NEURO:  CN's intact;  no focal neuro deficits... DERM:  No lesions noted; no rash etc...     MISC. Report  Procedure date:  02/17/2009  Findings:      Tests: (1) Lipid Panel (LIPID)   Cholesterol               141 mg/dL                   0-454   Triglycerides             87.0 mg/dL                  0.9-811.9   HDL                  [L]  14.78 mg/dL                 >29.56   VLDL Cholesterol          17.4 mg/dL                   2.1-30.8   LDL Cholesterol           88 mg/dL                    6-57  Tests: (2) BMP (METABOL)   Sodium                    143 mEq/L                   135-145   Potassium                 4.3 mEq/L                   3.5-5.1   Chloride                  103 mEq/L                   96-112   Carbon Dioxide            30 mEq/L                    19-32   Glucose              [H]  155 mg/dL                   84-69   BUN                       8 mg/dL                     6-29   Creatinine                0.7 mg/dL                   5.2-8.4   Calcium  9.6 mg/dL                   1.6-10.9   GFR                       87.42 mL/min                >60  Tests: (3) CBC Platelet w/Diff (CBCD)   White Cell Count          4.8 K/uL                    4.5-10.5   Red Cell Count            4.97 Mil/uL                 3.87-5.11   Hemoglobin                13.8 g/dL                   60.4-54.0   Hematocrit                41.8 %                      36.0-46.0   MCV                       84.1 fl                     78.0-100.0  Platelet Count            215.0 K/uL                  150.0-400.0   Neutrophil %              52.9 %                      43.0-77.0   Lymphocyte %              36.8 %                      12.0-46.0   Monocyte %                7.7 %                       3.0-12.0   Eosinophils%              1.9 %                       0.0-5.0   Basophils %               0.7 %    Comments:      Tests: (4) Hepatic/Liver Function Panel (HEPATIC)   Total Bilirubin      [H]  1.3 mg/dL                   9.8-1.1   Direct Bilirubin          0.2 mg/dL                   9.1-4.7   Alkaline Phosphatase      55 U/L  39-117   AST                  [H]  51 U/L                      0-37   ALT                  [H]  63 U/L                      0-35   Total Protein             7.7 g/dL                    6.0-4.5   Albumin                   4.0 g/dL                    4.0-9.8  Tests:  (5) TSH (TSH)   FastTSH                   0.82 uIU/mL                 0.35-5.50  Tests: (6) Hemoglobin A1C (A1C)   Hemoglobin A1C       [H]  8.0 %                       4.6-6.5    Impression & Recommendations:  Problem # 1:  HYPERTENSION (ICD-401.9) Controlled-  continue same meds and take regularly... I suggest Whitney [name] pick one pharm and stick w/ them. Her updated medication list for this problem includes:    Norvasc 5 Mg Tabs (Amlodipine besylate) .Marland Kitchen... 1 tab daily    Lisinopril 20 Mg Tabs (Lisinopril) .Marland Kitchen... 1 tab daily  Orders: Venipuncture ([id number]) TLB-Lipid Panel (80061-LIPID) TLB-BMP (Basic Metabolic Panel-BMET) (80048-METABOL) TLB-CBC Platelet - w/Differential (85025-CBCD) TLB-Hepatic/Liver Function Pnl (80076-HEPATIC) TLB-TSH (Thyroid Stimulating Hormone) (84443-TSH) TLB-A1C / Hgb A1C (Glycohemoglobin) (83036-A1C)  Problem # 2:  HYPERCHOLESTEROLEMIA (ICD-272.0) FLP looks OK on the Simva20 + diet Rx... rec> diet + exercise, get wt down! Her updated medication list for this problem includes:    Simvastatin 20 Mg Tabs (Simvastatin) .Marland Kitchen... 1 tab daily at bedtime  Problem # 3:  DIABETES MELLITUS, TYPE II, UNCONTROLLED (ICD-250.02) Whitney [name] hasn't increased the Lantus as prev directed to bring the FBS  ~100-120 range... reminded how to titrate, and to take the Metformin & Glimepiride regularly... diet + exercise as noted... REC>>> incr Lantus to 26 u now and titrate up...  Her updated medication list for this problem includes:    Aspirin 81 Mg Tbec (Aspirin) .Marland Kitchen... 1 tab daily    Lisinopril 20 Mg Tabs (Lisinopril) .Marland Kitchen... 1 tab daily    Lantus Solostar 100 Unit/ml Soln (Insulin glargine) .Marland Kitchen... 25 units subcutaneously daily    Metformin Hcl 1000 Mg Tabs (Metformin hcl) .Marland Kitchen... 1 tab two times a day    Glimepiride 4 Mg Tabs (Glimepiride) .Marland Kitchen... 1 tab daily  Orders: Venipuncture ([id number]) TLB-Lipid Panel (80061-LIPID) TLB-BMP (Basic Metabolic Panel-BMET) (80048-METABOL) TLB-CBC Platelet -  w/Differential (85025-CBCD) TLB-Hepatic/Liver Function Pnl (80076-HEPATIC) TLB-TSH (Thyroid Stimulating Hormone) (84443-TSH) TLB-A1C / Hgb A1C (Glycohemoglobin) (83036-A1C)  Problem # 4:  OBESITY (ICD-278.00) Weight loss is the key...  Problem # 5:  GERD (ICD-530.81) GI is stable- same Rx.  Problem # 6:  DEGENERATIVE JOINT DISEASE, KNEE (  ICD-715.96) Whitney [name] will f/u w/ Ortho Prn... Her updated medication list for this problem includes:    Aspirin 81 Mg Tbec (Aspirin) .Marland Kitchen... 1 tab daily    Vicodin 5-500 Mg Tabs (Hydrocodone-acetaminophen) .Marland Kitchen... 1/2 -1 by mouth every 4 hr as needed    Etodolac 400 Mg Tabs (Etodolac) .Marland Kitchen... Take 1 tab by mouth two times a day w/ food as needed for shoulder pain...  Problem # 7:  OTHER MEDICAL PROBLEMS AS NOTED>>> OK 2010 flu shot today...  Complete Medication List: 1)  Aspirin 81 Mg Tbec (Aspirin) .Marland Kitchen.. 1 tab daily 2)  Norvasc 5 Mg Tabs (Amlodipine besylate) .Marland Kitchen.. 1 tab daily 3)  Lisinopril 20 Mg Tabs (Lisinopril) .Marland Kitchen.. 1 tab daily 4)  Simvastatin 20 Mg Tabs (Simvastatin) .Marland Kitchen.. 1 tab daily at bedtime 5)  Lantus Solostar 100 Unit/ml Soln (Insulin glargine) .... 25 units subcutaneously daily 6)  Metformin Hcl 1000 Mg Tabs (Metformin hcl) .Marland Kitchen.. 1 tab two times a day 7)  Glimepiride 4 Mg Tabs (Glimepiride) .Marland Kitchen.. 1 tab daily 8)  Calcium-vitamin D 500-125 Mg-unit Tabs (Calcium-vitamin d) .Marland Kitchen.. 1 tab two times a day 9)  Multiple Vitamins-minerals Tabs (Multiple vitamins-minerals) .Marland Kitchen.. 1 tab daily 10)  Vicodin 5-500 Mg Tabs (Hydrocodone-acetaminophen) .... 1/2 -1 by mouth every 4 hr as needed 11)  Etodolac 400 Mg Tabs (Etodolac) .... Take 1 tab by mouth two times a day w/ food as needed for shoulder pain... 12)  Alprazolam 0.5 Mg Tabs (Alprazolam) .... Take 1 by mouth three times a day as needed  Other Orders: Admin 1st Vaccine (16109) Flu Vaccine 18yrs + (60454)  Patient Instructions: 1)  Today we updated your med list- see below.... 2)  If you need any refills-  have your pharmacy contact us... 3)  Today we did your follow up FASTING blood work- please call the "phone tree" in a few days for your lab results.Marland KitchenMarland Kitchen 4)  We gave you the 2010 flu vaccine today! 5)  Call for any problems.Marland KitchenMarland Kitchen 6)  Please schedule a follow-up appointment in 6 months. Flu Vaccine Consent Questions     Do you have a history of severe allergic reactions to this vaccine? no    Any prior history of allergic reactions to egg and/or gelatin? no    Do you have a sensitivity to the preservative Thimersol? no    Do you have a past history of Guillan-Barre Syndrome? no    Do you currently have an acute febrile illness? no    Have you ever had a severe reaction to latex? no    Vaccine information given and explained to patient? yes    Are you currently pregnant? no    Lot Number:[id number]   Exp Date:10/09/2009   Site Given  Left Deltoid IM Marijo File CMA  February 17, 2009 12:19 PM  ll for any problems.Marland KitchenMarland Kitchen 6)  Please schedule a follow-up appointment in 6 months.     .lbflu

## 2010-05-14 NOTE — Letter (Signed)
Summary: [facility] [employer]  [facility]N [facility]/[employer]   Imported By: Sherian Rein 02/12/2009 12:32:42  _____________________________________________________________________  External Attachment:    Type:   Image     Comment:   External Document

## 2010-05-14 NOTE — Assessment & Plan Note (Signed)
Summary: FOLLOW UP/ MBW  Medications Added SIMVASTATIN 20 MG  TABS (SIMVASTATIN) 1 tab daily at bedtime ETODOLAC 400 MG  TABS (ETODOLAC) 1 tab by mouth two times a day as needed arthritis pain      Allergies Added: NKDA  Chief Complaint:  4 month ROV....  History of Present Illness: 76 y/o BF here for a 4 month follow up visit... she notes some arthritis in her hands and takes ETODOLAC as needed... otherw doing well without new complaints... she had a good Christmas holiday ...   Current Problems:  HYPERTENSION (ICD-401.9) - controlled on NORVASC & LISINOPRIL... takes regularly and tol well... BP=130/80 at home and 124/80 today n office... denies HA, fatigue, visual changes, CP, palipit, dizziness, syncope, dyspnea, edema, etc... CHEST PAIN (ICD-786.50) HYPERCHOLESTEROLEMIA (ICD-272.0) - she tries to follow a low carb, low fat diet... wt stable at 195#... on SIMVASTATIN 20 and last FLP 9/08 showed TChol 150, TG 92, HDL 36, LDL 95... DIABETES MELLITUS, TYPE II, UNCONTROLLED (ICD-250.02) - takes LANTUS 20u daily, plus METFORMIN & GLIMEPERIDE...she's been exercising at home but unable to lose wt... labs 9/08 showed BS=172 and HgA1c=7.5.Marland KitchenMarland Kitchen she never increased her Lantus but states her BS at home have improved tp 100-120 range... OBESITY (ICD-278.00) - 195#... 5' tall... BMI=38! GERD (ICD-530.81)                                                       >>>>>SEE PMHx... DIVERTICULOSIS, COLON (ICD-562.10) DEGENERATIVE JOINT DISEASE, KNEE (ICD-715.96) BAKER'S CYST (ICD-727.51) SPONDYLOSIS, LUMBAR (ICD-721.3) DISC DISEASE, CERVICAL (ICD-722.4) OSTEOPENIA (ICD-733.90) FACIAL PAIN, ATYPICAL (ICD-350.2) ANXIETY (ICD-300.00) ONYCHOMYCOSIS (ICD-110.1)     Current Allergies: No known allergies   Past Medical History:        HYPERTENSION (ICD-401.9) - controlled on meds    CHEST PAIN (ICD-786.50) - Atypic CP eval 11/06 by Sol Passer, EKG w/ NSSTTW changes & neg nuclear stress test (EF 70 - no  change from 5/04 study).    HYPERCHOLESTEROLEMIA (ICD-272.0) - 5/04 = Chol 270 w/ HDL35, LDL 214  on diet alone; 06/21/06 = TChol 146 w/ HDL 36, LDL 86 on Simvistatin 20.    DIABETES MELLITUS, TYPE II, UNCONTROLLED (ICD-250.02) - Dx'd 90's on diet, oral agents, insulin since 2002; prev seen by DrKrege; AIC = 7.8 on 06/21/06 w/ lantus & amaryl increased.    OBESITY (ICD-278.00) - 5 ft tall, approx 200 lbs, = BMI 39-40;  no change in yrs despite nutrition counselling.        GERD (ICD-530.81) - uses protonix as needed, no prev EGD etc...    DIVERTICULOSIS, COLON (ICD-562.10) - last colon 3/05 by Dr Arlyss Repress f/u rec 7 yrs.        DEGENERATIVE JOINT DISEASE, KNEE (ICD-715.96) - eval 06 DrDraper w/ Hoover Brunette & Rx NSAID's    BAKER'S CYST (ICD-727.51)    SPONDYLOSIS, LUMBAR (ICD-721.3) - Eval 90 DrYates w/ Xrays & Rx conservatively.    DISC DISEASE, CERVICAL (ICD-722.4)    OSTEOPENIA (ICD-733.90) - BMD 2004 at hosp w/ Tscore -1.7 in spine.        FACIAL PAIN, ATYPICAL (ICD-350.2) - Eval 2001 DrSchmidt & resolved.    ANXIETY (ICD-300.00)    ONYCHOMYCOSIS (ICD-110.1) - eval [facility] in past.     Review of Systems  The patient denies anorexia, fever, weight loss, vision  loss, decreased hearing, hoarseness, chest pain, syncope, dyspnea on exhertion, peripheral edema, prolonged cough, hemoptysis, abdominal pain, melena, hematochezia, severe indigestion/heartburn, hematuria, incontinence, muscle weakness, suspicious skin lesions, transient blindness, difficulty walking, depression, unusual weight change, abnormal bleeding, enlarged lymph nodes, and angioedema.     Vital Signs:  Patient Profile:   76 Years Old Female Weight:      194.8 pounds O2 Sat:      97 % O2 treatment:    Room Air Temp:     97.3 degrees F oral Pulse rate:   73 / minute BP sitting:   124 / 80  (left arm)  Vitals Entered By: Ramiro Harvest CMA (April 24, 2007 10:52 AM)                 Physical Exam  WD,  sl overwt, 76 y/o BF in NAD... GENERAL:  Alert & oriented; pleasant & cooperative. HEENT:  /AT, EOM-wnl, PERRLA, EACs-clear, TMs-wnl, NOSE-clear, THROAT-clear & wnl. NECK:  Supple w/ full ROM; no JVD; normal carotid impulses w/o bruits; no thyromegaly or nodules palpated; no lymphadenopathy. CHEST:  Clear to P & A; without wheezes/ rales/ or rhonchi. HEART:  Regular Rhythm; without murmurs/ rubs/ or gallops. ABDOMEN:  soft & nontender, w/ sl panniculus, normal bowel sounds; no organomegaly or masses detected. EXT: without deformities or arthritic changes; no varicose veins/ venous insuffic/ or edema. NEURO:  CN's intact; motor testing normal; sensory testing normal; gait normal & balance OK. DERM:  No lesions noted; no rash etc...       Impression & Recommendations:  Problem # 1:  HYPERTENSION (ICD-401.9) Controlled... continue same Rx. Her updated medication list for this problem includes:    Lisinopril 20 Mg Tabs (Lisinopril) .Marland Kitchen... 1 tab daily    Norvasc 5 Mg Tabs (Amlodipine besylate) .Marland Kitchen... 1 tab daily  Orders: Venipuncture [id number]) TLB-Lipid Panel (80061-LIPID) TLB-BMP (Basic Metabolic Panel-BMET) (99991111) TLB-CBC Platelet - w/Differential (85025-CBCD) TLB-Hepatic/Liver Function Pnl (80076-HEPATIC) TLB-TSH (Thyroid Stimulating Hormone) (84443-TSH) TLB-A1C / Hgb A1C (Glycohemoglobin) (83036-A1C) TLB-Microalbumin/Creat Ratio, Urine (82043-MALB)   Problem # 2:  DIABETES MELLITUS, TYPE II, UNCONTROLLED (ICD-250.02) Improved by her numbers, but last A1c was 7.5.Marland KitchenMarland Kitchen await f/u labs. Her updated medication list for this problem includes:    Aspirin 81 Mg Tbec (Aspirin) .Marland Kitchen... 1 tab daily    Lisinopril 20 Mg Tabs (Lisinopril) .Marland Kitchen... 1 tab daily    Lantus Solostar 100 Unit/ml Soln (Insulin glargine) .Marland Kitchen... 20 units subcutaneously daily    Metformin Hcl 1000 Mg Tabs (Metformin hcl) .Marland Kitchen... 1 tab bid    Glimepiride 4 Mg Tabs (Glimepiride) .Marland Kitchen... 1 tab daily  TLB-BMP (Basic  Metabolic Panel-BMET) (99991111) TLB-A1C / Hgb A1C (Glycohemoglobin) (83036-A1C) TLB-Microalbumin/Creat Ratio, Urine (82043-MALB)   Problem # 3:  HYPERCHOLESTEROLEMIA (ICD-272.0) F/U fasting labs today on ZOCOR 20... Her updated medication list for this problem includes:    Simvastatin 20 Mg Tabs (Simvastatin) .Marland Kitchen... 1 tab daily at bedtime  Orders: TLB-Lipid Panel (80061-LIPID)   Problem # 4:  OBESITY (ICD-278.00) Discussed diet and ex program... must get her wt down!  Problem # 5:  DEGENERATIVE JOINT DISEASE, KNEE (ICD-715.96) Continue same meds and orthopedic f/u as needed Her updated medication list for this problem includes:    Aspirin 81 Mg Tbec (Aspirin) .Marland Kitchen... 1 tab daily    Etodolac 400 Mg Tabs (Etodolac) .Marland Kitchen... 1 tab by mouth two times a day as needed arthritis pain   Complete Medication List: 1)  Aspirin 81 Mg Tbec (Aspirin) .Marland Kitchen.. 1 tab daily 2)  Lisinopril 20 Mg Tabs (Lisinopril) .Marland Kitchen.. 1 tab daily 3)  Norvasc 5 Mg Tabs (Amlodipine besylate) .Marland Kitchen.. 1 tab daily 4)  Simvastatin 20 Mg Tabs (Simvastatin) .Marland Kitchen.. 1 tab daily at bedtime 5)  Lantus Solostar 100 Unit/ml Soln (Insulin glargine) .... 20 units subcutaneously daily 6)  Metformin Hcl 1000 Mg Tabs (Metformin hcl) .Marland Kitchen.. 1 tab bid 7)  Glimepiride 4 Mg Tabs (Glimepiride) .Marland Kitchen.. 1 tab daily 8)  Pantoprazole Sodium 40 Mg Tbec (Pantoprazole sodium) .Marland Kitchen.. 1 tab daily as needed 9)  Calcium-vitamin D 500-125 Mg-unit Tabs (Calcium-vitamin d) .Marland Kitchen.. 1 tab bid 10)  Multiple Vitamins-minerals Tabs (Multiple vitamins-minerals) .Marland Kitchen.. 1 tab daily 11)  Etodolac 400 Mg Tabs (Etodolac) .Marland Kitchen.. 1 tab by mouth two times a day as needed arthritis pain   Patient Instructions: 1)  Please schedule a follow-up appointment in 4 months. 2)  Today we refilled your meds for 2009.Marland KitchenMarland Kitchen 3)  We also did your FASTING lab work... please call the "phone tree" in a few days for your lab results.Marland KitchenMarland Kitchen 4)  Let me know if you have any problems or  concerns...    Prescriptions: ETODOLAC 400 MG  TABS (ETODOLAC) 1 tab by mouth two times a day as needed arthritis pain  #60 x prn   Entered and Authorized by:   Noralee Space MD   Signed by:   Noralee Space MD on 04/24/2007   Method used:   Print then Give to Patient   RxID:   [id number] GLIMEPIRIDE 4 MG  TABS (GLIMEPIRIDE) 1 tab daily  #30 x prn   Entered and Authorized by:   Noralee Space MD   Signed by:   Noralee Space MD on 04/24/2007   Method used:   Print then Give to Patient   RxID:   [id number] METFORMIN HCL 1000 MG  TABS (METFORMIN HCL) 1 tab bid  #60 x prn   Entered and Authorized by:   Noralee Space MD   Signed by:   Noralee Space MD on 04/24/2007   Method used:   Print then Give to Patient   RxID:   [id number] LANTUS SOLOSTAR 100 UNIT/ML  SOLN (INSULIN GLARGINE) 20 units subcutaneously daily  #1 month supp x prn   Entered and Authorized by:   Noralee Space MD   Signed by:   Noralee Space MD on 04/24/2007   Method used:   Print then Give to Patient   RxID:   [id number] SIMVASTATIN 20 MG  TABS (SIMVASTATIN) 1 tab daily at bedtime  #30 x prn   Entered and Authorized by:   Noralee Space MD   Signed by:   Noralee Space MD on 04/24/2007   Method used:   Print then Give to Patient   RxID:   [id number] [place] 5 MG  TABS (AMLODIPINE BESYLATE) 1 tab daily  #30 x prn   Entered and Authorized by:   Noralee Space MD   Signed by:   Noralee Space MD on 04/24/2007   Method used:   Print then Give to Patient   [id number] LISINOPRIL 20 MG  TABS (LISINOPRIL) 1 tab daily  #30 x prn   Entered and Authorized by:   Noralee Space MD   Signed by:   Noralee Space MD on 04/24/2007   Method used:   Print then Give to Patient   RxIDIV:[id number]  ]

## 2010-05-14 NOTE — Assessment & Plan Note (Signed)
Summary: rov 6 months///kp   CC:  6 month ROV & review of mult medical problems....  History of Present Illness: 76 y/o [name] here for a follow up visit... she has mult med problems as noted below...     ~  May 13, 2009:  she went to the ER 05/02/09 w/ HBP= 170-180/ 90-100 range... she was asymptomatic, just concerned- they checked EKG= NSR, NSSTTWA, no change;  and BMet= OK x BS=239... they incr her Norvasc to 10mg /d & f/u here>> improved but still 140/88 today & we will change her Lisinopril to include HCT... weight w/o improvement despite diet counselling etc... she incr Lantus to 26 & notes FBS betw 117-145 at home...    ~  Aug 18, 2009:  she's feeling better, still getting nutrition counselling at [facility] & wt down 2# to 189#... she notes home BS betw 70-190 on 30u Lantus, etc... BP controlled on meds;  Chol OK on the Simva20;  DM control improved w/ A1c down to 7.3 now...   ~  February 16, 2010:  her weight is down 7# to 182# now & improved on diet & exercise (walking & aerobics)... she notes BSs are "OK" and FBS=121, A1c=7.3.Marland KitchenMarland Kitchen BP well controlled on meds;  no CP, palpit, SOB, edema, etc... we discussed both 2011 Flu vaccine & Pneumoax today...   Current Problem List:  GLAUCOMA (ICD-365.9) - eval by DrGroat & Rx w/ Travatan eye drops...  HYPERTENSION (ICD-401.9) - on NORVASC 10mg /d  & LISINOPRIL/HCT 20-12.5 daily... takes regularly and tol well... BP=120/68 today and similar at home she says... denies HA, fatigue, visual changes, CP, palipit, dizziness, syncope, dyspnea, edema, etc...  ~  1/11:  went to ER w/ incr BP readings 170-180/ 90-100 & Norvasc incr to 10mg /d...  ~  2/11:  BP 140/88 & we decided to change the Lisinopril20 to LISINOPRIL/ HCT 20-12.5 daily...  ~  All BP reading since then have been WNL...  CHEST PAIN (ICD-786.50) - on ASA 81mg /d... Atypical CP eval 11/06 by Nunzio Cory- EKG w/ NSSTTWA, & NuclearStressTest neg- no ischemia or infarct, EF=70%... no recent symptoms... she's  been too sedentary but promises to walk and increase her treadmill times...  HYPERCHOLESTEROLEMIA (ICD-272.0) - on SIMVASTATIN 20mg /d... she tries to follow a low carb, low fat diet...  ~  baseline FLP 5/04 on diet alone TChol 270, TG 108, HDL 35, LDL 214...  ~  FLP 9/08 on Simvastatin 20mg /d showed TChol 150, TG 92, HDL 36, LDL 95...  ~  FLP 1/09 showed TChol 137, TG 89, HDL 32, LDL 87... rec- continue the Simva20.  ~  FLP 5/10 showed TChol 124, TG 69, HDL 34, LDL 77  ~  FLP 5/11 showed TChol 141, TG 100, HDL 38, LDL 84  ~  FLP 11/11 showed TChol 126, TG 71, HDL 34, LDL 78  DIABETES MELLITUS, TYPE II, UNCONTROLLED (ICD-250.02) - prev eval for Endocrine by DrKrege... takes now LANTUS 30u daily, plus METFORMIN 1000mg Bid & GLIMEPERIDE 4mg /d... she's been trying to follow diet, incr exercise & lose wt.... diet counselling from [facility]- Maggie May.  ~  labs 9/08 showed BS=172 and HgA1c=7.5.Marland KitchenMarland Kitchen she never increased her Lantus, but states her BS at home improved to 100-120 range...  ~  labs 1/09 showed BS= 132,  HgA1c= 6.8.Marland Kitchen.  ~  labs 5/09 showed BS= 142, HgA1c= 7.0.Marland KitchenMarland Kitchen rec sl incr Lantus til FBS ~ 100-110 (she didn't do this).  ~  labs 11/09 showed BS= 109, A1c= 7.2  ~  labs 5/10 showed BS= 161, A1c= 7.8.Marland Kitchen. rec> incr Lantus slowly til FBS 100-120 (she never did).  ~  labs 11/10 showed BS= 155, A1c= 8.0.Marland Kitchen. rec> incr Lantus to 26 now & titrate up.  ~  labs 2/11 showed A1c= 7.7.Marland Kitchen. rec> incr Lantus to 30.  ~  labs 5/11 showed BS= 142, A1c= 7.3.Marland KitchenMarland Kitchen continue same, get wt down.  ~  labs 11/11 showed BS= 121, A1c= 7.3.Marland KitchenMarland Kitchen same meds, diet, exerc, wt reduction.  OBESITY (ICD-278.00) - 182#... 5' tall... BMI ~ 35 now... she's getting dietary help at [facility]...  GERD (ICD-530.81) - uses PRILOSEC 20mg /d as needed... no prev EGD.  DIVERTICULOSIS, COLON (ICD-562.10) - last colnoscopy 3/05 by DrSam w/ divertics only, f/u planned 35yrs... she has some mild constipation & uses "smooth  move"...  DEGENERATIVE JOINT DISEASE, KNEE (ICD-715.96) - eval 2006 by DrDraper... Rx NSAID's as needed...   ~  11/09 c/o left shoulder pain... rec ~ try ETODOLAC 400mg Bid and refer to DrSypher- PT helped.  BAKER'S CYST (ICD-727.51) w/ prev eval from DrMurphy/ Ortho...  SPONDYLOSIS, LUMBAR (ICD-721.3) - eval by DrYates 1990 w/ conservative rx rec...  DISC DISEASE, CERVICAL (ICD-722.4) - she notes that massage helps...  Hx of OSTEOPENIA (ICD-733.90) - she takes Calcium, MVI, Vit D...  ~  BMD 2004 w/ TScore -1.7 in spine...  ~  BMD repeat 5/09 was WNL w/ TScores +0.0 in Spine,  &  +0.8 in right FemNeck.  ~  labs 5/11 showed Vit D level = 40... continue MVI, Calcium, etc...  ~  BMD 8/11 showed TScores +0.3 in Spine, and +1.1 in left FemNeck.  FACIAL PAIN, ATYPICAL (ICD-350.2) - eval 2001 by neuro/ DrSchmidt and resolved...  ANXIETY (ICD-300.00) - prev used Alprazolam Prn but off now... her husb passed away 6/10 & she has slowly recovered...  ONYCHOMYCOSIS (ICD-110.1) - eval by Podiatry & foot care clinic in the past...  ~  2/11: she reports recent eval by DrTuckman for pain right lat malleolus- Rx Keflex.  NOTE:  I attempted to confirm med refills & compliance w/ her Pharm- she actually uses 3 pharmacies and can't tell me why? [facility], [facility] [place], & [facility] pharm as well...  ~  Immunizations:  she get yearly seasonal Flu vaccines... had TETANUS shot 2004... PNEUMOVAX in 92 (age 53) & 2011 (age 25).   Preventive Screening-Counseling & Management  Alcohol-Tobacco     Smoking Status: never  Allergies (verified): No Known Drug Allergies  Comments:  Nurse/Medical Assistant: The patient's medications and allergies were reviewed with the patient and were updated in the Medication and Allergy Lists.  Past History:  Past Medical History: GLAUCOMA (ICD-365.9) HYPERTENSION (ICD-401.9) CHEST PAIN (ICD-786.50) HYPERCHOLESTEROLEMIA (ICD-272.0) DIABETES MELLITUS, TYPE  II, UNCONTROLLED (ICD-250.02) OBESITY (ICD-278.00) GERD (ICD-530.81) DIVERTICULOSIS, COLON (ICD-562.10) DEGENERATIVE JOINT DISEASE, KNEE (ICD-715.96) BAKER'S CYST (ICD-727.51) SPONDYLOSIS, LUMBAR (ICD-721.3) DISC DISEASE, CERVICAL (ICD-722.4) OSTEOPENIA (ICD-733.90) FACIAL PAIN, ATYPICAL (ICD-350.2) ANXIETY (ICD-300.00) ONYCHOMYCOSIS (ICD-110.1)  Past Surgical History: Hysterectomy - S/P Vag Hyst & AP repair by Hulda Humphrey 2002  Family History: Reviewed history from 02/17/2009 and no changes required. Father died unknown age from an MI Mother died age 24 from a stroke 2 Siblings: both brothers & both died from strokes, 1 was diabetic  Social History: Reviewed history from 02/17/2009 and no changes required. Widow- husb passed 6/10 Never smoked No alcohol  Review of Systems      See HPI  The patient denies anorexia, fever, weight loss, weight gain, vision loss, decreased hearing, hoarseness, chest pain, syncope,  dyspnea on exertion, peripheral edema, prolonged cough, headaches, hemoptysis, abdominal pain, melena, hematochezia, severe indigestion/heartburn, hematuria, incontinence, muscle weakness, suspicious skin lesions, transient blindness, difficulty walking, depression, unusual weight change, abnormal bleeding, enlarged lymph nodes, and angioedema.    Vital Signs:  Patient profile:   76 year old female Height:      61 inches Weight:      182.13 pounds BMI:     34.54 O2 Sat:      98 % on Room air Temp:     98.3 degrees F oral Pulse rate:   70 / minute BP sitting:   120 / 68  (left arm) Cuff size:   regular  Vitals Entered By: Randell Loop CMA (February 16, 2010 9:24 AM)  O2 Sat at Rest %:  98 O2 Flow:  Room air CC: 6 month ROV & review of mult medical problems... Is Patient Diabetic? Yes Pain Assessment Patient in pain? no      Comments no changes in meds today   Physical Exam  Additional Exam:  WD, Overwt, 75 y/o [name] in NAD... GENERAL:  Alert & oriented;  pleasant & cooperative... HEENT:  [place]/AT, EOM-wnl, PERRLA, EACs-clear, TMs-wnl, NOSE-clear, THROAT-clear & wnl. NECK:  Supple w/ fairROM; no JVD; normal carotid impulses w/o bruits; no thyromegaly or nodules palpated; no lymphadenopathy. CHEST:  Clear to P & A; without wheezes/ rales/ or rhonchi. HEART:  Regular Rhythm; without murmurs/ rubs/ or gallops. ABDOMEN:  Soft & nontender, w/ +panniculus, normal bowel sounds; no organomegaly or masses detected. EXT: without deformities, mild arthritic changes; no varicose veins/ venous insuffic/ or edema. NEURO:  CN's intact;  no focal neuro deficits... DERM:  No lesions noted; no rash etc...    MISC. Report  Procedure date:  02/16/2010  Findings:      BMP (METABOL)   Sodium                    142 mEq/L                   135-145   Potassium                 4.7 mEq/L                   3.5-5.1   Chloride                  104 mEq/L                   96-112   Carbon Dioxide            31 mEq/L                    19-32   Glucose              [H]  121 mg/dL                   16-10   BUN                       12 mg/dL                    9-60   Creatinine                0.7 mg/dL                   4.5-4.0  Calcium                   10.0 mg/dL                  0.9-81.1   GFR                       87.17 mL/min                >60  Hepatic/Liver Function Panel (HEPATIC)   Total Bilirubin           0.9 mg/dL                   9.1-4.7   Direct Bilirubin          0.1 mg/dL                   8.2-9.5   Alkaline Phosphatase      49 U/L                      39-117   AST                  [H]  38 U/L                      0-37   ALT                  [H]  42 U/L                      0-35   Total Protein             7.3 g/dL                    6.2-1.3   Albumin                   3.9 g/dL                    0.8-6.5  Lipid Panel (LIPID)   Cholesterol               126 mg/dL                   7-846   Triglycerides             71.0 mg/dL                   9.6-295.2   HDL                  [L]  84.13 mg/dL                 >24.40   LDL Cholesterol           78 mg/dL                    1-02  Hemoglobin A1C (A1C)   Hemoglobin A1C       [H]  7.3 %                       4.6-6.5   Impression & Recommendations:  Problem # 1:  HYPERTENSION (ICD-401.9) Controlled>  same meds. Her updated medication list for this problem includes:    Amlodipine Besylate 10 Mg Tabs (Amlodipine besylate) .Marland Kitchen... Take 1 tab by mouth once daily.Marland KitchenMarland Kitchen  Lisinopril-hydrochlorothiazide 20-12.5 Mg Tabs (Lisinopril-hydrochlorothiazide) .Marland Kitchen... Take 1 tab by mouth once daily...  Orders: TLB-Hepatic/Liver Function Pnl (80076-HEPATIC) TLB-Lipid Panel (80061-LIPID) TLB-A1C / Hgb A1C (Glycohemoglobin) (83036-A1C)  Problem # 2:  HYPERCHOLESTEROLEMIA (ICD-272.0) FLP stable on Simva20>  contin ue same. Her updated medication list for this problem includes:    Simvastatin 20 Mg Tabs (Simvastatin) .Marland Kitchen... 1 tab daily at bedtime  Problem # 3:  GERD (ICD-530.81) GI is stable>  continue same...  Problem # 4:  DEGENERATIVE JOINT DISEASE, KNEE (ICD-715.96) She has DJD, neck & back pain>  continue Rx & exercise program...  Her updated medication list for this problem includes:    Aspirin 81 Mg Tbec (Aspirin) .Marland Kitchen... 1 tab daily    Etodolac 400 Mg Tabs (Etodolac) .Marland Kitchen... Take 1 tab by mouth two times a day w/ food as needed for shoulder pain...  Problem # 5:  OTHER MEDICAL PROBLEMS AS NOTED>>> OK Flu shot & Pneumovax tday...  Complete Medication List: 1)  Travatan Z 0.004 % Soln (Travoprost) .... Use one drop in each eye at bedtime 2)  Aspirin 81 Mg Tbec (Aspirin) .Marland Kitchen.. 1 tab daily 3)  Amlodipine Besylate 10 Mg Tabs (Amlodipine besylate) .... Take 1 tab by mouth once daily.Marland KitchenMarland Kitchen 4)  Lisinopril-hydrochlorothiazide 20-12.5 Mg Tabs (Lisinopril-hydrochlorothiazide) .... Take 1 tab by mouth once daily.Marland KitchenMarland Kitchen 5)  Simvastatin 20 Mg Tabs (Simvastatin) .Marland Kitchen.. 1 tab daily at bedtime 6)  Lantus Solostar 100  Unit/ml Soln (Insulin glargine) .... 30  units subcutaneously daily 7)  Metformin Hcl 1000 Mg Tabs (Metformin hcl) .Marland Kitchen.. 1 tab two times a day 8)  Glimepiride 4 Mg Tabs (Glimepiride) .Marland Kitchen.. 1 tab daily 9)  Calcium-vitamin D 500-125 Mg-unit Tabs (Calcium-vitamin d) .Marland Kitchen.. 1 tab two times a day 10)  Multiple Vitamins-minerals Tabs (Multiple vitamins-minerals) .Marland Kitchen.. 1 tab daily 11)  Etodolac 400 Mg Tabs (Etodolac) .... Take 1 tab by mouth two times a day w/ food as needed for shoulder pain... 12)  Bd Insulin Syringe Ultrafine 30g X 1/2" 1 Ml Misc (Insulin syringe-needle u-100) .... Use as directed  Other Orders: Influenza Vaccine MCR (16109) Pneumococcal Vaccine (60454) Admin 1st Vaccine (09811) TLB-BMP (Basic Metabolic Panel-BMET) (80048-METABOL)  Patient Instructions: 1)  Today we updated your med list- see below.... 2)  Continue your current meds the same... 3)  Today we gave you the 2011 Flu vaccine, and the PNEUMONIA vaccine (based on current guidlines- this is the last Pneumovax you will need)... 4)  We also did your f/u FASTING blood work... please call the "phone tree" in a few days for your lab results.Marland KitchenMarland Kitchen 5)  Keep up the great job w/ diet + exercise> your wt is down 7#!!! 6)  Call for any problems.Marland KitchenMarland Kitchen 7)  Please schedule a follow-up appointment in 6 months.   Immunizations Administered:  Influenza Vaccine # 1:    Vaccine Type: Fluvax MCR    Site: left deltoid    Mfr: novartis    Dose: 0.5 ml    Route: IM    Given by: Randell Loop CMA    Exp. Date: 09/2010    Lot #: [id number]    VIS given: 11/04/09 version given February 16, 2010.  Pneumonia Vaccine:    Vaccine Type: Pneumovax (Medicare)    Site: right deltoid    Mfr: Merck    Dose: 0.5 ml    Route: IM    Given by: Randell Loop CMA    Exp. Date: 07/31/2010    Lot #: [id number]  VIS given: 03/17/09 version given February 16, 2010.  Flu Vaccine Consent Questions:    Do you have a history of severe allergic reactions to this  vaccine? no    Any prior history of allergic reactions to egg and/or gelatin? no    Do you have a sensitivity to the preservative Thimersol? no    Do you have a past history of Guillan-Barre Syndrome? no    Do you currently have an acute febrile illness? no    Have you ever had a severe reaction to latex? no    Vaccine information given and explained to patient? yes    Are you currently pregnant? no

## 2010-05-14 NOTE — Assessment & Plan Note (Signed)
Summary: FOLLOW UP/ MBW   Chief Complaint:  4 month ROV....  History of Present Illness: 76 y/o BF here for a follow up visit... she has mult med problems as noted below... states she is stable without new complaints or concerns today...   Current Problem List:  HYPERTENSION (ICD-401.9) - controlled on NORVASC 5mg /d  & LISINOPRIL 20mg /d... takes regularly and tol well... BP=130/80 at home and 132/74 today... denies HA, fatigue, visual changes, CP, palipit, dizziness, syncope, dyspnea, edema, etc...  CHEST PAIN (ICD-786.50) - Atypical CP eval 11/06 by Nunzio Cory- EKG w/ NSSTTWA, & Nuclear StressTest neg- no ischemia or infarct, EF=70%... no recent symptoms... she's been walking, etc without pain.  HYPERCHOLESTEROLEMIA (ICD-272.0) - she tries to follow a low carb, low fat diet... on SIMVASTATIN 20mg /d... weight down to 185# today!  ~  baseline FLP 5/04 on diet alone TChol 270, TG 108, HDL 35, LDL 214...  ~  prev FLP 9/08 on Simvastatin 20mg /d showed TChol 150, TG 92, HDL 36, LDL 95...  ~  last FLP 1/09 showed TChol 137, TG 89, HDL 32, LDL 87...  DIABETES MELLITUS, TYPE II, UNCONTROLLED (ICD-250.02) - prev eval for endocrine by DrKrege... takes LANTUS 20u daily, plus METFORMIN 1000mg Bid & GLIMEPERIDE 4mg /d... she's been exercising at home & weight down 10#.Marland Kitchen.   ~  labs 9/08 showed BS=172 and HgA1c=7.5.Marland KitchenMarland Kitchen she never increased her Lantus, but states her BS at home improved to 100-120 range...  ~  last labs 1/09 showed BS= 132,  HgA1c= 6.8.Marland Kitchen.  ~  labs today w/ BS= 142, HgA1c= 7.0.Marland KitchenMarland Kitchen rec sl incr Lantus til FBS ~ 100-110, cont weight loss diet.  OBESITY (ICD-278.00) - 185#... 5' tall... BMI= 36 now... she is down 10# the last 4 months... keep up the good work!  GERD (ICD-530.81) - uses PROTONIX 40mg /d as needed... no prev EGD.  DIVERTICULOSIS, COLON (ICD-562.10) - last colnoscopy 3/05 by DrSam w/ divertics only, f/u planned 42yrs.  DEGENERATIVE JOINT DISEASE, KNEE (ICD-715.96) - eval 2006 by  DrDraper... Rx NSAID's as needed.  BAKER'S CYST (ICD-727.51)  SPONDYLOSIS, LUMBAR (ICD-721.3) - eval by DrYates 1990 w/ conservative rx rec...  DISC DISEASE, CERVICAL (ICD-722.4)  OSTEOPENIA (ICD-733.90) - BMD 2004 w/ TScore -1.7 in spine...  FACIAL PAIN, ATYPICAL (ICD-350.2) - eval 2001 by neuro/ DrSchmidt and resolved...  ANXIETY (ICD-300.00)  ONYCHOMYCOSIS (ICD-110.1) - eval by Podiatry & foot care clinic in the past...       Current Allergies: No known allergies   Past Medical History:        HYPERTENSION (ICD-401.9)    CHEST PAIN (ICD-786.50)    HYPERCHOLESTEROLEMIA (ICD-272.0)    DIABETES MELLITUS, TYPE II, UNCONTROLLED (ICD-250.02)    OBESITY (ICD-278.00)    GERD (ICD-530.81)    DIVERTICULOSIS, COLON (ICD-562.10)    DEGENERATIVE JOINT DISEASE, KNEE (ICD-715.96)    BAKER'S CYST (ICD-727.51)    SPONDYLOSIS, LUMBAR (ICD-721.3)    DISC DISEASE, CERVICAL (ICD-722.4)    OSTEOPENIA (ICD-733.90)    FACIAL PAIN, ATYPICAL (ICD-350.2)    ANXIETY (ICD-300.00)    ONYCHOMYCOSIS (ICD-110.1)      Past Surgical History:    Hysterectomy - S/P Vag Hyst & AP repair by Hulda Humphrey 2002     Review of Systems  The patient denies anorexia, fever, weight loss, weight gain, vision loss, decreased hearing, hoarseness, chest pain, syncope, peripheral edema, prolonged cough, headaches, hemoptysis, abdominal pain, melena, hematochezia, severe indigestion/heartburn, hematuria, incontinence, muscle weakness, suspicious skin lesions, transient blindness, difficulty walking, depression, unusual weight change, abnormal bleeding, enlarged lymph nodes, and  angioedema.     Vital Signs:  Patient Profile:   77 Years Old Female Weight:      185.25 pounds O2 Sat:      97 % O2 treatment:    Room Air Temp:     97.7 degrees F oral Pulse rate:   74 / minute BP sitting:   132 / 74  (right arm)  Vitals Entered By: Marijo File CMA (Aug 23, 2007 8:56 AM)                 Physical  Exam  WD, sl overwt, 76 y/o BF in NAD... GENERAL:  Alert & oriented; pleasant & cooperative... HEENT:  [place]/AT, EOM-wnl, PERRLA, EACs-clear, TMs-wnl, NOSE-clear, THROAT-clear & wnl. NECK:  Supple w/ full ROM; no JVD; normal carotid impulses w/o bruits; no thyromegaly or nodules palpated; no lymphadenopathy. CHEST:  Clear to P & A; without wheezes/ rales/ or rhonchi. HEART:  Regular Rhythm; without murmurs/ rubs/ or gallops. ABDOMEN:  soft & nontender, w/ sl panniculus, normal bowel sounds; no organomegaly or masses detected. EXT: without deformities or arthritic changes; no varicose veins/ venous insuffic/ or edema. NEURO:  CN's intact;  no focal neuro deficits... DERM:  No lesions noted; no rash etc...       Impression & Recommendations:  Problem # 1:  HYPERTENSION (ICD-401.9) Assessment: Unchanged controlled... same meds. Her updated medication list for this problem includes:    Lisinopril 20 Mg Tabs (Lisinopril) .Marland Kitchen... 1 tab daily    Norvasc 5 Mg Tabs (Amlodipine besylate) .Marland Kitchen... 1 tab daily  Orders: Venipuncture (16109) TLB-BMP (Basic Metabolic Panel-BMET) (80048-METABOL) TLB-A1C / Hgb A1C (Glycohemoglobin) (83036-A1C)   Problem # 2:  HYPERCHOLESTEROLEMIA (ICD-272.0) Assessment: Unchanged stable on Simvastatin + diet/ exercise. Her updated medication list for this problem includes:    Simvastatin 20 Mg Tabs (Simvastatin) .Marland Kitchen... 1 tab daily at bedtime   Problem # 3:  DIABETES MELLITUS, TYPE II, UNCONTROLLED (ICD-250.02) Assessment: Unchanged her weight is down 10# but control about the same... rec sl incr Lantus for better FBS control. Her updated medication list for this problem includes:    Aspirin 81 Mg Tbec (Aspirin) .Marland Kitchen... 1 tab daily    Lisinopril 20 Mg Tabs (Lisinopril) .Marland Kitchen... 1 tab daily    Lantus Solostar 100 Unit/ml Soln (Insulin glargine) .Marland Kitchen... 20 units subcutaneously daily    Metformin Hcl 1000 Mg Tabs (Metformin hcl) .Marland Kitchen... 1 tab bid    Glimepiride 4 Mg Tabs  (Glimepiride) .Marland Kitchen... 1 tab daily  Problem # 4:  OTHER MED PROBLEMS...  Assessment: Unchanged as noted... stable.  Complete Medication List: 1)  Aspirin 81 Mg Tbec (Aspirin) .Marland Kitchen.. 1 tab daily 2)  Lisinopril 20 Mg Tabs (Lisinopril) .Marland Kitchen.. 1 tab daily 3)  Norvasc 5 Mg Tabs (Amlodipine besylate) .Marland Kitchen.. 1 tab daily 4)  Simvastatin 20 Mg Tabs (Simvastatin) .Marland Kitchen.. 1 tab daily at bedtime 5)  Lantus Solostar 100 Unit/ml Soln (Insulin glargine) .... 20 units subcutaneously daily 6)  Metformin Hcl 1000 Mg Tabs (Metformin hcl) .Marland Kitchen.. 1 tab bid 7)  Glimepiride 4 Mg Tabs (Glimepiride) .Marland Kitchen.. 1 tab daily 8)  Pantoprazole Sodium 40 Mg Tbec (Pantoprazole sodium) .Marland Kitchen.. 1 tab daily as needed 9)  Calcium-vitamin D 500-125 Mg-unit Tabs (Calcium-vitamin d) .Marland Kitchen.. 1 tab bid 10)  Multiple Vitamins-minerals Tabs (Multiple vitamins-minerals) .Marland Kitchen.. 1 tab daily  Other Orders: T-Bone Densitometry [phone])   Patient Instructions: 1)  Raimi, you look great... keep up the good work!!! 2)  Today we updated youer med list- see below. 3)  We also followed up your BS and HgA1c test... please call the "phone tree" in a few days for your lab results.Marland KitchenMarland Kitchen 4)  We will arrange for a f/u Bone Density test.... and call you when the results are avail.Marland KitchenMarland Kitchen 5)  Please schedule a follow-up appointment in 6 months.   ]

## 2010-06-28 LAB — POCT I-STAT, CHEM 8
BUN: 7 mg/dL (ref 6–23)
Calcium, Ion: 1.26 mmol/L (ref 1.12–1.32)
Chloride: 104 mEq/L (ref 96–112)
Creatinine, Ser: 0.5 mg/dL (ref 0.4–1.2)
Glucose, Bld: 239 mg/dL — ABNORMAL HIGH (ref 70–99)
HCT: 44 % (ref 36.0–46.0)
Hemoglobin: 15 g/dL (ref 12.0–15.0)
Potassium: 3.8 mEq/L (ref 3.5–5.1)
Sodium: 140 mEq/L (ref 135–145)
TCO2: 27 mmol/L (ref 0–100)

## 2010-07-14 ENCOUNTER — Other Ambulatory Visit: Payer: Self-pay | Admitting: Pulmonary Disease

## 2010-07-31 ENCOUNTER — Encounter: Payer: Self-pay | Admitting: Pulmonary Disease

## 2010-08-18 ENCOUNTER — Ambulatory Visit (INDEPENDENT_AMBULATORY_CARE_PROVIDER_SITE_OTHER): Payer: Medicare Other | Admitting: Pulmonary Disease

## 2010-08-18 ENCOUNTER — Other Ambulatory Visit (INDEPENDENT_AMBULATORY_CARE_PROVIDER_SITE_OTHER): Payer: Medicare Other

## 2010-08-18 DIAGNOSIS — IMO0001 Reserved for inherently not codable concepts without codable children: Secondary | ICD-10-CM

## 2010-08-18 DIAGNOSIS — IMO0002 Reserved for concepts with insufficient information to code with codable children: Secondary | ICD-10-CM

## 2010-08-18 DIAGNOSIS — E7800 Pure hypercholesterolemia, unspecified: Secondary | ICD-10-CM

## 2010-08-18 DIAGNOSIS — E669 Obesity, unspecified: Secondary | ICD-10-CM

## 2010-08-18 DIAGNOSIS — M1710 Unilateral primary osteoarthritis, unspecified knee: Secondary | ICD-10-CM

## 2010-08-18 DIAGNOSIS — I10 Essential (primary) hypertension: Secondary | ICD-10-CM

## 2010-08-18 DIAGNOSIS — K5730 Diverticulosis of large intestine without perforation or abscess without bleeding: Secondary | ICD-10-CM

## 2010-08-18 DIAGNOSIS — F411 Generalized anxiety disorder: Secondary | ICD-10-CM

## 2010-08-18 DIAGNOSIS — K219 Gastro-esophageal reflux disease without esophagitis: Secondary | ICD-10-CM

## 2010-08-18 LAB — TSH: TSH: 0.99 u[IU]/mL (ref 0.35–5.50)

## 2010-08-18 LAB — BASIC METABOLIC PANEL
BUN: 13 mg/dL (ref 6–23)
CO2: 27 mEq/L (ref 19–32)
Calcium: 9.5 mg/dL (ref 8.4–10.5)
Chloride: 106 mEq/L (ref 96–112)
Creatinine, Ser: 0.7 mg/dL (ref 0.4–1.2)
GFR: 81.65 mL/min (ref >60.00)
Glucose, Bld: 119 mg/dL — ABNORMAL HIGH (ref 70–99)
Potassium: 4 mEq/L (ref 3.5–5.1)
Sodium: 140 mEq/L (ref 135–145)

## 2010-08-18 LAB — LIPID PANEL
Cholesterol: 125 mg/dL (ref 0–200)
HDL: 33.2 mg/dL — ABNORMAL LOW (ref >39.00)
LDL Cholesterol: 75 mg/dL (ref 0–99)
Total CHOL/HDL Ratio: 4
Triglycerides: 85 mg/dL (ref 0.0–149.0)
VLDL: 17 mg/dL (ref 0.0–40.0)

## 2010-08-18 LAB — HEPATIC FUNCTION PANEL
ALT: 34 U/L (ref 0–35)
AST: 29 U/L (ref 0–37)
Albumin: 3.8 g/dL (ref 3.5–5.2)
Alkaline Phosphatase: 45 U/L (ref 39–117)
Bilirubin, Direct: 0.1 mg/dL (ref 0.0–0.3)
Total Bilirubin: 1.5 mg/dL — ABNORMAL HIGH (ref 0.3–1.2)
Total Protein: 7.1 g/dL (ref 6.0–8.3)

## 2010-08-18 LAB — CBC WITH DIFFERENTIAL/PLATELET
Basophils Absolute: 0 10*3/uL (ref 0.0–0.1)
Basophils Relative: 0.6 % (ref 0.0–3.0)
Eosinophils Absolute: 0.1 10*3/uL (ref 0.0–0.7)
Eosinophils Relative: 1.9 % (ref 0.0–5.0)
HCT: 40.6 % (ref 36.0–46.0)
Hemoglobin: 13.7 g/dL (ref 12.0–15.0)
Lymphocytes Relative: 36.9 % (ref 12.0–46.0)
Lymphs Abs: 2.1 10*3/uL (ref 0.7–4.0)
MCHC: 33.8 g/dL (ref 30.0–36.0)
MCV: 82.6 fl (ref 78.0–100.0)
Monocytes Absolute: 0.4 10*3/uL (ref 0.1–1.0)
Monocytes Relative: 6.9 % (ref 3.0–12.0)
Neutro Abs: 3 10*3/uL (ref 1.4–7.7)
Neutrophils Relative %: 53.7 % (ref 43.0–77.0)
Platelets: 208 10*3/uL (ref 150.0–400.0)
RBC: 4.92 Mil/uL (ref 3.87–5.11)
RDW: 13.9 % (ref 11.5–14.6)
WBC: 5.6 10*3/uL (ref 4.5–10.5)

## 2010-08-18 LAB — HEMOGLOBIN A1C: Hgb A1c MFr Bld: 7.3 % — ABNORMAL HIGH (ref 4.6–6.5)

## 2010-08-18 MED ORDER — GLIMEPIRIDE 4 MG PO TABS: 4.0000 mg | ORAL_TABLET | Freq: Every day | ORAL | Status: DC

## 2010-08-18 NOTE — Patient Instructions (Signed)
Today we updated your med list in our new EPIC system...    Continue your current meds the same...    We refilled the meds you requested...  Today we did your follow up fasting blood work...    Please call the PHONE TREE in a few days for your results...    Dial [id number] & when prompted enter your patient number followed by the # symbol...    Your patient number is:  [id number]#  Let's get on track w/ our diet & exercise program...    REMEMBER how important exercise is as we age...  Call for any questions...    Let's plan a follow up visit in another 8mo.Marland KitchenMarland Kitchen

## 2010-08-18 NOTE — Progress Notes (Signed)
Subjective:    Patient ID: Whitney [name], female    DOB: 06/01/1934, 76 y.o.   MRN: [id number]  HPI 76 y/o BF here for a follow up visit... she has mult med problems as noted below...    ~  Aug 18, 2009:  she's feeling better, still getting nutrition counselling at [facility], & wt down 2# to 189#... she notes home BS betw 70-190 on 30u Lantus, etc... BP controlled on meds;  Chol OK on the Simva20;  DM control improved w/ A1c down to 7.3 now...  ~  February 16, 2010:  her weight is down 7# to 182# now & improved on diet & exercise (walking & aerobics)... she notes BSs are "OK" and FBS=121, A1c=7.3.Marland KitchenMarland Kitchen BP well controlled on meds;  no CP, palpit, SOB, edema, etc... we discussed both 2011 Flu vaccine & Pneumoax today...  ~  Aug 18, 2010:  25mo ROV & she reports a good interval w/o new complaints or concerns; [facility] but weight about the same & we reviewed diet & exercise prescription for her...  Due for fasting labs- see below:        Problem List:  GLAUCOMA (ICD-365.9) - eval by DrGroat & Rx w/ Travatan eye drops...  HYPERTENSION (ICD-401.9) - on NORVASC 10mg /d  & LISINOPRIL/HCT 20-12.5 daily... ~  1/11:  went to ER w/ incr BP readings 170-180/ 90-100 & Norvasc incr to 10mg /d... ~  2/11:  BP 140/88 & we decided to change the Lisinopril20 to LISINOPRIL/ HCT 20-12.5 daily;  All BP reading since then have been WNL... ~  11/11: BP=120/68 and similar at home she says... denies HA, fatigue, visual changes, CP, palipit, dizziness, syncope, dyspnea, edema, etc... ~  5/12:  BP= 122/62 & feeling well, remains asymptomatic...  CHEST PAIN (ICD-786.50) - on ASA 81mg /d... Atypical CP eval 11/06 by Nunzio Cory- EKG w/ NSSTTWA, & NuclearStressTest neg- no ischemia or infarct, EF=70%... ~  5/12:  She denies CP, palpit, SOB, edema, etc;  We reviewed exercise recommendations...  HYPERCHOLESTEROLEMIA (ICD-272.0) - on SIMVASTATIN 20mg /d... she tries to follow a low carb, low fat diet... ~   baseline FLP 5/04 on diet alone TChol 270, TG 108, HDL 35, LDL 214... ~  FLP 9/08 on Simvastatin 20mg /d showed TChol 150, TG 92, HDL 36, LDL 95... ~  FLP 1/09 showed TChol 137, TG 89, HDL 32, LDL 87... rec- continue the Simva20. ~  FLP 5/10 showed TChol 124, TG 69, HDL 34, LDL 77 ~  FLP 5/11 showed TChol 141, TG 100, HDL 38, LDL 84 ~  FLP 11/11 showed TChol 126, TG 71, HDL 34, LDL 78 ~  FLP 5/12 on Simva20 showed TChol 125, TG 85, HDL 33, LDL 75  DIABETES MELLITUS, TYPE II, UNCONTROLLED (ICD-250.02) - prev eval for Endocrine by DrKrege... takes LANTUS 30u daily, plus METFORMIN 1000mg Bid & GLIMEPERIDE 4mg /d... she's been trying to follow diet, incr exercise & lose wt.... diet counselling from [facility]- Maggie May. ~  labs 9/08 showed BS=172 and HgA1c=7.5.Marland KitchenMarland Kitchen she never increased her Lantus, but states her BS at home improved to 100-120 range... ~  labs 1/09 showed BS= 132,  HgA1c= 6.8.Marland Kitchen. ~  labs 5/09 showed BS= 142, HgA1c= 7.0.Marland KitchenMarland Kitchen rec sl incr Lantus til FBS~ 100-110 (she didn't do this). ~  labs 11/09 showed BS= 109, A1c= 7.2 ~  labs 5/10 showed BS= 161, A1c= 7.8.Marland Kitchen. rec> incr Lantus slowly til FBS 100-120 (she never did). ~  labs 11/10  showed BS= 155, A1c= 8.0.Marland Kitchen. rec> incr Lantus to 26 now & titrate up. ~  labs 2/11 showed A1c= 7.7.Marland Kitchen. rec> incr Lantus to 30. ~  labs 5/11 showed BS= 142, A1c= 7.3.Marland KitchenMarland Kitchen continue same, get wt down. ~  labs 11/11 showed BS= 121, A1c= 7.3.Marland KitchenMarland Kitchen same meds, diet, exerc, wt reduction. ~  labs 5/12 on Lant30+Met1000Bid+Glim4 showed BS=119, A1c= 7.3  OBESITY (ICD-278.00) - we've discussed diet + exercise & she's been to [facility] for counselling... ~  11/11:  Weight =182#... 5' tall > BMI~ 35... ~  5/12:  Weight = 180#  GERD (ICD-530.81) - uses Prilosec OTC 20mg /d as needed... no prev EGD.  DIVERTICULOSIS, COLON (ICD-562.10) - last colnoscopy 3/05 by DrSam w/ divertics only, f/u planned 23yrs... she has some mild constipation & uses "smooth move"...  DEGENERATIVE  JOINT DISEASE, KNEE (ICD-715.96) - eval 2006 by DrDraper... Rx OTC NSAID's as needed...  ~  11/09 c/o left shoulder pain... rec to try Etodolac and refer to DrSypher- PT helped.  BAKER'S CYST (ICD-727.51) w/ prev eval from DrMurphy/ Ortho...  SPONDYLOSIS, LUMBAR (ICD-721.3) - eval by DrYates 1990 w/ conservative rx rec...  DISC DISEASE, CERVICAL (ICD-722.4) - she notes that massage helps...  Hx of OSTEOPENIA (ICD-733.90) - she takes Calcium, MVI, Vit D... ~  BMD 2004 w/ TScore -1.7 in spine... ~  BMD repeat 5/09 was WNL w/ TScores +0.0 in Spine,  &  +0.8 in right FemNeck. ~  labs 5/11 showed Vit D level = 40... continue MVI, Calcium, etc... ~  BMD 8/11 showed TScores +0.3 in Spine, and +1.1 in left FemNeck.  FACIAL PAIN, ATYPICAL (ICD-350.2) - eval 2001 by neuro/ DrSchmidt and resolved...  ANXIETY (ICD-300.00) - prev used Alprazolam Prn but off now... her husb passed away 6/10 & she has slowly recovered...  ONYCHOMYCOSIS (ICD-110.1) - eval by Podiatry & foot care clinic in the past... ~  2/11: she reports recent eval by DrTuckman for pain right lat malleolus- Rx Keflex.  NOTE:  I attempted to confirm med refills & compliance w/ her Pharm- she actually uses 3 pharmacies and can't tell me why? [facility], [facility] [place], & [facility] as well... ~  Immunizations:  she get yearly seasonal Flu vaccines... had TETANUS shot 2004... PNEUMOVAX in 24 (age 65) & 2011 (age 50).   Past Surgical History  Procedure Date  . Vaginal hysterectomy 2002    Dr. Donny Pique repair    Outpatient Encounter Prescriptions as of 08/18/2010  Medication Sig Dispense Refill  . amLODipine (NORVASC) 10 MG tablet Take 10 mg by mouth daily.        Marland Kitchen aspirin 81 MG tablet Take 81 mg by mouth daily.        . Calcium Carbonate-Vitamin D (CALCIUM-VITAMIN D) 500-200 MG-UNIT per tablet Take 1 tablet by mouth 2 (two) times daily with a meal.        . glimepiride (AMARYL) 4 MG tablet Take 4 mg by mouth daily  before breakfast.        . insulin glargine (LANTUS SOLOSTAR) 100 UNIT/ML injection Inject 30 Units into the skin at bedtime.        Marland Kitchen lisinopril-hydrochlorothiazide (PRINZIDE,ZESTORETIC) 20-12.5 MG per tablet Take 1 tablet by mouth daily.        . metFORMIN (GLUCOPHAGE) 1000 MG tablet Take 1,000 mg by mouth 2 (two) times daily with a meal.        . Multiple Vitamin (MULTIVITAMIN PO) Take 1 tablet by mouth daily.        Marland Kitchen  simvastatin (ZOCOR) 20 MG tablet TAKE ONE TABLET BY MOUTH AT BEDTIME  30 tablet  5  . travoprost, benzalkonium, (TRAVATAN) 0.004 % ophthalmic solution Place 1 drop into both eyes at bedtime.        Marland Kitchen DISCONTD: etodolac (LODINE) 400 MG tablet Take 400 mg by mouth 2 (two) times daily. With food as needed for shoulder pain          No Known Allergies   Review of Systems        See HPI - all other systems neg except as noted... The patient denies anorexia, fever, weight loss, weight gain, vision loss, decreased hearing, hoarseness, chest pain, syncope, dyspnea on exertion, peripheral edema, prolonged cough, headaches, hemoptysis, abdominal pain, melena, hematochezia, severe indigestion/heartburn, hematuria, incontinence, muscle weakness, suspicious skin lesions, transient blindness, difficulty walking, depression, unusual weight change, abnormal bleeding, enlarged lymph nodes, and angioedema.     Objective:   Physical Exam     WD, Overwt, 75 y/o BF in NAD... Vital Signs:  Reviewed... GENERAL:  Alert & oriented; pleasant & cooperative... HEENT:  [place]/AT, EOM-wnl, PERRLA, EACs-clear, TMs-wnl, NOSE-clear, THROAT-clear & wnl. NECK:  Supple w/ fairROM; no JVD; normal carotid impulses w/o bruits; no thyromegaly or nodules palpated; no lymphadenopathy. CHEST:  Clear to P & A; without wheezes/ rales/ or rhonchi. HEART:  Regular Rhythm; without murmurs/ rubs/ or gallops. ABDOMEN:  Soft & nontender, w/ +panniculus, normal bowel sounds; no organomegaly or masses detected. EXT: without  deformities, mild arthritic changes; no varicose veins/ venous insuffic/ or edema. NEURO:  CN's intact;  no focal neuro deficits... DERM:  No lesions noted; no rash etc...   Assessment & Plan:   HBP>  Controlled on CCB, ACE, Diuretic; continue same...  Hx CP>  Denies angina, needs incr exercise program...  CHOL>  Looks good on Simva20...  DM>  Control fair on 3 meds, needs better diet 7 get wt down!  Obesity>  As above...  GI>  GERD, Constip, Divertics>  She notes some constip 7 we discussed fiber etc...  ORTHO>  She is off the etodolac & uses Tylenol, Aleve, etc..Marland Kitchen

## 2010-08-19 LAB — MICROALBUMIN, URINE: Microalb, Ur: 0.71 mg/dL (ref 0.00–1.89)

## 2010-08-28 NOTE — Op Note (Signed)
[facility]  Patient:    Whitney [name], Whitney [name]                  MRN: [id number] Proc. Date: 11/24/00 Adm. Date:  [id number] Attending:  Malon Kindle                           Operative Report  PREOPERATIVE DIAGNOSES:  History of severe dysplasia on conization of the cervix with minimally abnormal Pap smear, large rectocele, second-degree cystocele.  POSTOPERATIVE DIAGNOSES:  History of severe dysplasia on conization of the cervix with minimally abnormal Pap smear, large rectocele, second-degree cystocele, large fibroids.  OPERATION:  Vaginal hysterectomy, A&P repair.  OPERATOR:  Malachi Pro. Ambrose Mantle, M.D.  ASSISTANT;  Zenaida Niece, M.D.  ANESTHESIA:  General.  DESCRIPTION OF PROCEDURE:  The patient was brought to the operating room and placed under satisfactory general anesthesia and placed in lithotomy position. Exam revealed as preoperatively that the large rectocele went right to the cervix, so that there was no posterior cul-de-sac.  The adnexa were free of masses.  I could not feel the uterus well.  The weighted speculum was placed posteriorly.  The cervix was drawn into the operative field.  The cervicovaginal junction was injected with dilute solution of Neo-Synephrine. Again, it was noted that the rectocele came right to the posterior cervix.  It seemed like there was some scarring in the posterior cervicovaginal junction, so I incised this area with scissors, trying to push the rectocele posteriorly.  I then made a circumferential incision around the cervix, advanced the bladder, tried to advance the rectum posteriorly, did a rectal exam and thought that I could enter posteriorly, but I could not enter the cul-de-sac posteriorly.  I then did extra peritoneal sutures through the uterosacral ligaments and cardinal ligaments bilaterally and, then after I went above the cardinal ligaments, I actually went into the peritoneal  cavity posteriorly.  At this point, I continued to advance the bladder.  I entered the anterior peritoneum, continued to work up the sides of the uterus and even after going well above the uterine vessels, the uterus would not invert through the incision and the cul-de-sac.  I kept working along the sides of the uterus and realized the reason the uterus did not come down, was she had several fibroids projecting off the posterior aspect of the uterus that were holding it up.  I finally was able to invert the uterus with the fibroids and then placed double clamps across each upper pedicle, doubly suture ligated both upper pedicles, and removed the uterus.  I did not see the ovaries.  I established hemostasis by running a lock suture of 0 Vicryl across the posterior vaginal cuff.  I then placed a pursestring suture of #1 Vicryl through the anterior pelvic peritoneum, through the upper pedicles and uterosacral ligaments and posterior cul-de-sac.  I left this untied.  I did a couple of sutures through the uterosacral ligaments and then tied the peritoneal cavity stitch down.  I then dissected the anterior vaginal mucosa all the way to the urethra.  I developed a small cystocele, obliterated the cystocele with 2-3 interrupted sutures of 0 Vicryl, removed excess vaginal mucosa, and then reapproximated the vaginal mucosa in the midline using multiple interrupted figure-of-eight sutures of 0 Vicryl, and I continued this on the way down to the vaginal cuff.  I then turned my attention posteriorly. It should be noted that I  infused the bladder with methylene blue.  There was no leakage.  I cut across the posterior vaginal fourchette, undermined the large rectocele all the way to the vaginal cuff, established the rectocele, dissecting it from the sidewalls of the vagina, and then reduced the rectocele with multiple interrupted sutures, mainly of 3-0 Vicryl.  I then cut off a large amount of redundant  vaginal mucosa, reunited the vaginal mucosa in the midline using multiple interrupted figure-of-eight sutures of 0 Vicryl.  I established complete hemostasis.  I placed a two-inch Iodoform pack into the vagina, and the procedure was terminated.  It should be noted that during the operation, I actually did three rectal exams to try to confirm that there was no rectal injury and try to establish exactly where the posterior cul-de-sac was.  At all times, I found that there had been no rectal injury.  Blood loss for the procedure was thought to be about 300 cc.  Sponge and needle counts were correct, and the patient was returned to recovery in satisfactory condition. DD:  11/24/00 TD:  11/24/00 Job: [id number]

## 2010-08-28 NOTE — Discharge Summary (Signed)
[facility]  Patient:    Whitney [name], Whitney [name] Visit Number: [id number] MRN: [id number]          Service Type: GYN Location: 4W [id number] [id number] Attending Physician:  Lenell Antu Dictated by:   Lucille Passy. Ulanda Edison, M.D. Admit Date:  11/24/2000 Discharge Date: 11/26/2000                             Discharge Summary  HISTORY OF PRESENT ILLNESS:  A 76 year old black female with a history of CIN3 n conization with minimally abnormal Paps, 4th degree rectocele, 2nd degree cystocele, admitted for vaginal hysterectomy and A&P repair.  The rectocele went all the way to the cervix.  The patient underwent the vaginal hysterectomy and A&P repair by Dr. Lucille Passy. [place] with Dr. Blane Ohara. Meisinger assisting, under general anesthesia.  Blood loss about 300 cc.  One pack was left in the vagina.  It was removed on the first postoperative day and it was moderately stained.  On the second postoperative day, she was doing well, tolerating a regular diet, good pain control.  She was afebrile.  Her abdomen was soft and nontender and she was discharged home with the Foley.  Admission white count was 4800, hemoglobin 12.1, hematocrit 36.4, platelet count 258,000.  Follow-up hematocrits were 29.9 and 29.3.  Normal differential.  Comprehensive metabolic profile was normal except for a glucose of 169 and total protein of 8.1.  Urinalysis was negative.  Pathology report showed uterus with mild inflammation of the cervix, benign and active endometrium, leiomyomata of the myometrium.  Vaginal mucosa with benign squamous mucosa and chronic inflammation.  There were several fibroids on the uterus; however, the pathology report did not really describe them in great details, namely because they had been removed separately from the uterus.  They were not described well in the description.  There were two leiomyoma subserosal-one was 1.1 cm and was 3.7 cm in greatest dimension.   The previously listed fragments of tissue were actually pieces of vaginal mucosa from the repair.  The cervix was entirely submitted in view of her previous history of cervical dysplasia and no dysplasia was identified.  Dr. Gari Crown and Dr. Valente David reviewed the study and found no evidence of existing dysplasia.  FINAL DIAGNOSES: 1. History of cervical intraepithelial neoplasia-3 conization. 2. Large rectocele, smaller cystocele. 3. Leiomyomata uteri. 4. Diabetes. 5. High blood pressure.  OPERATIONS:  Vaginal hysterectomy and A&P repair.  CONDITION ON DISCHARGE:  Improved.  DISCHARGE INSTRUCTIONS:  Regular diet.  No vaginal entrance.  Return to the office after removing her catheter within one week for follow-up examination.  DISCHARGE MEDICATIONS:  She is given a prescription for Cisco and Macrobid. Dictated by:   Lucille Passy. Ulanda Edison, M.D. Attending Physician:  Lenell Antu DD:  01/17/01 TD:  01/18/01 Job: [phone]

## 2010-08-28 NOTE — H&P (Signed)
[facility]  Patient:    Whitney [name], Whitney [name]                     MRN: [id number] Adm. Date:  [id number] Disc. Date: [id number] Attending:  Lenell Antu                         History and Physical  HISTORY OF PRESENT ILLNESS:  This is a 76 year old black female para 5, 0-0-4, who is admitted to the hospital for vaginal hysterectomy, possible abdominal hysterectomy, and probable A&P repair.  This patient had her last period in 1996.  In March 2002 and November 2001 she had minimally abnormal Pap smears as well as in September 2001.  The Pap smears were ASCUS or low-grade SIL. Colposcopy was unsatisfactory.  She underwent a D&C and conization, April 2002, that showed severe squamous dysplasia, CIN3, focally involving the ectocervical margin in the 12 to 3 oclock and 3 to 6 oclock quadrants. Endocervical curettage was negative for dysplasia, and the endometrial curettage showed scant atrophic endometrium.  Because of the severe nature of the lesion with only minimally abnormal Pap smears, the patient was advised to consider hysterectomy.  She elected to proceed with hysterectomy.  She has been informed thoroughly of the risks involved with the surgery, including the fact that her anatomy is quite distorted and she has a rectocele that comes all the way to the posterior cervix.  The patient does not have any excessive symptoms with her rectocele and cystocele; however, I am reluctant not to repair them because the loss of the uterus may indeed worsen the relaxation.  ALLERGIES:  No known drug allergies.  PAST MEDICAL HISTORY:  No known heart problems.  ILLNESSES:  Diabetes, high blood pressure, elevated cholesterol.  OPERATIONS:  D&C, conization of cervix.  SOCIAL HISTORY:  The patient had five vaginal deliveries.  One of the babies died in the first year of life.  She does not drink and does not smoke.  REVIEW OF SYSTEMS:  Essentially  noncontributory.  MEDICATIONS:  1. Glucophage 1000 mg 2 x q.d.  2. Daypro 600 mg q.d.  3. HydroDIURIL 25 mg q.d.  4. Norvasc 5 mg q.d.  5. Avandia 8 mg q.d.  6. Prinivil 20 mg q.d.  7. Zocor 20 mg q.d.  8. Baby aspirin 81 mg q.d.  9. Centrum. 10. Novolin N insulin 10 units at night.  FAMILY HISTORY:  Mother died at 4 of diabetes and a brain tumor.  Father died of unknown cause.  She does not have any sisters, she has one brother.  PHYSICAL EXAMINATION:  VITAL SIGNS:  Blood pressure 134/80, pulse 80, weight 192.5 pounds.  GENERAL:  Well-developed, obese, black female in no acute distress.  HEENT:  No cranial abnormalities.  Extraocular movements are intact.  Nose and pharynx are clear.  There are upper and lower partial plates.  NECK:  Supple without thyromegaly.  BREASTS:  Soft without masses.  LUNGS:  Clear to P&A.  HEART:  Normal size and sounds without murmurs.  ABDOMEN:  Soft, no masses palpable.  It is obese.  Liver, spleen, and kidney are not felt.  PELVIC:  Vulva and vagina shows normal external genitalia.  There is a fourth degree rectocele and a second degree cystocele.  Rectocele goes all the way to the back of the cervix.  The cervix is hard to visualize.  It always had a short posterior cervix, but  now that she has had the conization and has some atrophy of the vagina, cervix is hard to visualize, but can easily be felt. Uterus I hard to feel.  There is thought to be some scarring posterior to the cervix.  Adnexa clear.  Rectovaginal confirms above findings.  ADMITTING IMPRESSION/PLAN:  History of severe cervical dysplasia with a minimally abnormal Pap smear.  Diabetes, high blood pressure, fourth degree rectocele, and second and third degree cystocele.  The patient is admitted for vaginal hysterectomy, A&P repair, possible abdominal hysterectomy.  She understands there are risks associated with the surgery including, but not limited to heart attacks,  stroke, pulmonary embolus, wound disruption, hemorrhage with need for reoperation and/or transfusion, fistula formation, and nerve injury.  She understands and agrees to proceed. DD:  11/23/00 TD:  11/23/00 Job: [id number]

## 2010-09-01 NOTE — Op Note (Signed)
[facility]  Patient:    Whitney [name], Whitney [name]                     MRN: [id number] Proc. Date: 07/29/00 Adm. Date:  [id number] Disc. Date: [id number] Attending:  Malon Kindle                           Operative Report  PREOPERATIVE DIAGNOSES: 1. Persistent abnormal cytology. 2. Fourth degree rectocele. 3. Diabetes.  POSTOPERATIVE DIAGNOSES: 1. Persistent abnormal cytology. 2. Fourth degree rectocele. 3. Diabetes.  OPERATION:  Dilatation and curettage, cold knife conization of the cervix.  SURGEON:  Malachi Pro. Ambrose Mantle, M.D.  ANESTHESIA:  General.  INDICATIONS:  The patient had undergone colposcopy in the office which was unsatisfactory but there were no visible external lesions.  The patient has a fourth degree rectocele.  The rectocele goes all the way to the back of the cervix.  The posterior lip of the cervix seems to be somewhat attenuated and I feel like the patient is at high risk for rectal injury.  DESCRIPTION OF PROCEDURE:  The patient was placed in the lithotomy position under general anesthesia.  The vulva, vagina and perineum were prepped with Betadine solution and draped as a sterile field.  Using a weighted speculum, I could not visualized the cervix.  However, when I switched to using a Deaver posteriorly and anteriorly I was able to find the cervix and I drew the cervix into the operative field and then replaced the posterior Deaver with a weighted speculum.  I placed three single-tooth tenacula on the cervix at 12, 4 and 8 oclock trying to pull the cervix away from the large rectocele.  I then injected the cervix with a dilute solution of Pitressin using about 12-13 cc total of a solution prepared by mixing 10 units of Pitressin and 120 cc of saline.  I then did the cold knife conization trying at all times to preserve the rectum without injury.  The conization was carried to a depth of 15 mm.  I then removed the conization.   I dilated the cervix to about a 21 Pratt dilator.  I did an endocervical curettage followed by an endometrial curettage saving all tissues.  I then placed four sutures of 0 chromic at 12, 6, 3 and 9 oclock.  Actually, the sutures entered at 11, exited at 1, entered at 2, exited at 4, entered at 5, exited at 7 and entered at 99, exited at 71.  There seemed to be very minimal bleeding.  The procedure was then terminated after I performed a rectal exam and confirmed that there had been no injury to the rectum.  Blood loss was less than 5 cc.  Sponge and needle counts were correct and the patient returned to recovery in satisfactory condition. DD:  07/29/00 TD:  07/31/00 Job: [id number]

## 2010-09-07 ENCOUNTER — Encounter: Payer: Self-pay | Admitting: Pulmonary Disease

## 2010-09-08 ENCOUNTER — Telehealth: Payer: Self-pay | Admitting: Pulmonary Disease

## 2010-09-08 NOTE — Telephone Encounter (Signed)
I called PT as well and msg states results are not available.  Pls advise lab results. thanks

## 2010-09-09 NOTE — Telephone Encounter (Signed)
Called and spoke with pt about her lab results per SN---lip looks good on the simvastatin 20mg  daily so keep the same,  bs 119/a1c7.3--on all of her meds so keep the same and do a better diet, get weight down,   lft was normal, cbc was normal, thyroid was normal, urine was normal.  Pt voiced her understanding of lab results.

## 2010-09-11 HISTORY — PX: OTHER SURGICAL HISTORY: SHX169

## 2010-09-30 ENCOUNTER — Other Ambulatory Visit: Payer: Self-pay | Admitting: Pulmonary Disease

## 2010-10-02 ENCOUNTER — Telehealth: Payer: Self-pay | Admitting: Pulmonary Disease

## 2010-10-02 MED ORDER — METFORMIN HCL 1000 MG PO TABS: 1000.0000 mg | ORAL_TABLET | Freq: Two times a day (BID) | ORAL | Status: DC

## 2010-10-02 NOTE — Telephone Encounter (Signed)
Spoke with pt and verified msg. Rx refill for metformin sent to pharm- 90 day supply per pt request.

## 2010-11-25 ENCOUNTER — Other Ambulatory Visit: Payer: Self-pay | Admitting: *Deleted

## 2010-11-25 MED ORDER — AMLODIPINE BESYLATE 10 MG PO TABS: 10.0000 mg | ORAL_TABLET | Freq: Every day | ORAL | Status: DC

## 2010-11-26 ENCOUNTER — Other Ambulatory Visit: Payer: Self-pay | Admitting: Pulmonary Disease

## 2010-11-27 ENCOUNTER — Other Ambulatory Visit: Payer: Self-pay | Admitting: Pulmonary Disease

## 2010-11-27 ENCOUNTER — Telehealth: Payer: Self-pay | Admitting: Pulmonary Disease

## 2010-11-27 ENCOUNTER — Other Ambulatory Visit: Payer: Self-pay

## 2010-11-27 MED ORDER — AMLODIPINE BESYLATE 10 MG PO TABS: 10.0000 mg | ORAL_TABLET | Freq: Every day | ORAL | Status: DC

## 2010-11-27 NOTE — Telephone Encounter (Signed)
Pt aware prescriptions have been sent. Lantus to [facility] and Norvasc to [facility].

## 2010-12-04 ENCOUNTER — Other Ambulatory Visit: Payer: Self-pay | Admitting: Pulmonary Disease

## 2010-12-04 DIAGNOSIS — Z1231 Encounter for screening mammogram for malignant neoplasm of breast: Secondary | ICD-10-CM

## 2011-01-08 ENCOUNTER — Ambulatory Visit
Admission: RE | Admit: 2011-01-08 | Discharge: 2011-01-08 | Disposition: A | Discharge status: Home / Self Care | Payer: Medicare Other | Source: Ambulatory Visit | Attending: Pulmonary Disease | Admitting: Pulmonary Disease

## 2011-01-08 DIAGNOSIS — Z1231 Encounter for screening mammogram for malignant neoplasm of breast: Secondary | ICD-10-CM

## 2011-01-16 ENCOUNTER — Other Ambulatory Visit: Payer: Self-pay | Admitting: Pulmonary Disease

## 2011-02-16 ENCOUNTER — Telehealth: Payer: Self-pay | Admitting: Pulmonary Disease

## 2011-02-16 MED ORDER — GLUCOSE BLOOD VI STRP: ORAL_STRIP | Status: AC

## 2011-02-16 NOTE — Telephone Encounter (Signed)
Called and spoke with pharmacist at [facility] and gave verbal order for freestyle test strips- #100 with 12 rf.

## 2011-02-18 ENCOUNTER — Ambulatory Visit (INDEPENDENT_AMBULATORY_CARE_PROVIDER_SITE_OTHER): Payer: Medicare Other | Admitting: Pulmonary Disease

## 2011-02-18 ENCOUNTER — Other Ambulatory Visit (INDEPENDENT_AMBULATORY_CARE_PROVIDER_SITE_OTHER): Payer: Medicare Other

## 2011-02-18 ENCOUNTER — Encounter: Payer: Self-pay | Admitting: Pulmonary Disease

## 2011-02-18 DIAGNOSIS — I10 Essential (primary) hypertension: Secondary | ICD-10-CM

## 2011-02-18 DIAGNOSIS — IMO0001 Reserved for inherently not codable concepts without codable children: Secondary | ICD-10-CM

## 2011-02-18 DIAGNOSIS — E7800 Pure hypercholesterolemia, unspecified: Secondary | ICD-10-CM

## 2011-02-18 DIAGNOSIS — E669 Obesity, unspecified: Secondary | ICD-10-CM

## 2011-02-18 DIAGNOSIS — K5730 Diverticulosis of large intestine without perforation or abscess without bleeding: Secondary | ICD-10-CM

## 2011-02-18 DIAGNOSIS — M1710 Unilateral primary osteoarthritis, unspecified knee: Secondary | ICD-10-CM

## 2011-02-18 DIAGNOSIS — F411 Generalized anxiety disorder: Secondary | ICD-10-CM

## 2011-02-18 DIAGNOSIS — K219 Gastro-esophageal reflux disease without esophagitis: Secondary | ICD-10-CM

## 2011-02-18 DIAGNOSIS — Z23 Encounter for immunization: Secondary | ICD-10-CM

## 2011-02-18 DIAGNOSIS — IMO0002 Reserved for concepts with insufficient information to code with codable children: Secondary | ICD-10-CM

## 2011-02-18 LAB — LIPID PANEL
Cholesterol: 127 mg/dL (ref 0–200)
HDL: 41.6 mg/dL (ref >39.00)
LDL Cholesterol: 68 mg/dL (ref 0–99)
Total CHOL/HDL Ratio: 3
Triglycerides: 89 mg/dL (ref 0.0–149.0)
VLDL: 17.8 mg/dL (ref 0.0–40.0)

## 2011-02-18 LAB — BASIC METABOLIC PANEL
BUN: 9 mg/dL (ref 6–23)
CO2: 30 mEq/L (ref 19–32)
Calcium: 9.4 mg/dL (ref 8.4–10.5)
Chloride: 104 mEq/L (ref 96–112)
Creatinine, Ser: 0.8 mg/dL (ref 0.4–1.2)
GFR: 80.28 mL/min (ref >60.00)
Glucose, Bld: 108 mg/dL — ABNORMAL HIGH (ref 70–99)
Potassium: 3.9 mEq/L (ref 3.5–5.1)
Sodium: 141 mEq/L (ref 135–145)

## 2011-02-18 LAB — HEPATIC FUNCTION PANEL
ALT: 52 U/L — ABNORMAL HIGH (ref 0–35)
AST: 46 U/L — ABNORMAL HIGH (ref 0–37)
Albumin: 4.3 g/dL (ref 3.5–5.2)
Alkaline Phosphatase: 50 U/L (ref 39–117)
Bilirubin, Direct: 0.2 mg/dL (ref 0.0–0.3)
Total Bilirubin: 1.6 mg/dL — ABNORMAL HIGH (ref 0.3–1.2)
Total Protein: 8 g/dL (ref 6.0–8.3)

## 2011-02-18 LAB — HEMOGLOBIN A1C: Hgb A1c MFr Bld: 7.2 % — ABNORMAL HIGH (ref 4.6–6.5)

## 2011-02-18 MED ORDER — INSULIN GLARGINE 100 UNIT/ML ~~LOC~~ SOLN: SUBCUTANEOUS | Status: DC

## 2011-02-18 MED ORDER — METFORMIN HCL 1000 MG PO TABS: 1000.0000 mg | ORAL_TABLET | Freq: Two times a day (BID) | ORAL | Status: DC

## 2011-02-18 MED ORDER — AMLODIPINE BESYLATE 10 MG PO TABS: 10.0000 mg | ORAL_TABLET | Freq: Every day | ORAL | Status: DC

## 2011-02-18 MED ORDER — GLIMEPIRIDE 4 MG PO TABS: 4.0000 mg | ORAL_TABLET | Freq: Every day | ORAL | Status: DC

## 2011-02-18 MED ORDER — LISINOPRIL-HYDROCHLOROTHIAZIDE 20-12.5 MG PO TABS: 1.0000 | ORAL_TABLET | Freq: Every day | ORAL | Status: DC

## 2011-02-18 MED ORDER — SIMVASTATIN 20 MG PO TABS: 20.0000 mg | ORAL_TABLET | Freq: Every day | ORAL | Status: DC

## 2011-02-18 NOTE — Progress Notes (Signed)
Subjective:    Patient ID: MAYO OWCZARZAK, female    DOB: 1935/03/05, 76 y.o.   MRN: [id number]  HPI 76 y/o BF here for a follow up visit... she has mult med problems as noted below...    ~  Aug 18, 2009:  she's feeling better, still getting nutrition counselling at [facility], & wt down 2# to 189#... she notes home BS betw 70-190 on 30u Lantus, etc... BP controlled on meds;  Chol OK on the Simva20;  DM control improved w/ A1c down to 7.3 now...  ~  February 16, 2010:  her weight is down 7# to 182# now & improved on diet & exercise (walking & aerobics)... she notes BSs are "OK" and FBS=121, A1c=7.3.Marland KitchenMarland Kitchen BP well controlled on meds;  no CP, palpit, SOB, edema, etc... we discussed both 2011 Flu vaccine & Pneumoax today...  ~  Aug 18, 2010:  92mo ROV & she reports a good interval w/o new complaints or concerns; [facility] but [facility] & we reviewed diet & exercise prescription for her...  Due for fasting labs- see below:  ~  February 18, 2011:  92mo ROV & she reports prob w/ right eye per DrGroat (exudates seen) & sent to DrSanders w/ some kind of ?retina surg done in office (we don't have notes from him); weight unchanged in the 180# range & we will refer to [facility] for help w/ diet; FLP looks good on Simva20, BSs ok on Lantus + Metform/ Glimep...  See prob list below>>          Problem List:  GLAUCOMA (ICD-365.9) - eval by DrGroat & Rx w/ Travatan eye drops...  ~  2012:  She developed some exudates in right eye & DrGroat sent her to DrSanders who did some type of surg on her retina in his office (we don't have notes to review)...  HYPERTENSION (ICD-401.9) - on NORVASC 10mg /d  & LISINOPRIL/HCT 20-12.5 daily... ~  1/11:  went to ER w/ incr BP readings 170-180/ 90-100 & Norvasc incr to 10mg /d... ~  2/11:  BP 140/88 & we decided to change the Lisinopril20 to LISINOPRIL/ HCT 20-12.5 daily;  All BP reading since then have been WNL... ~  11/11: BP=120/68 and  similar at home she says... denies HA, fatigue, visual changes, CP, palipit, dizziness, syncope, dyspnea, edema, etc... ~  5/12:  BP= 122/62 & feeling well, remains asymptomatic... ~  11/12:  BP= 130/82 & she remains asymptomatic...  CHEST PAIN (ICD-786.50) - on ASA 81mg /d... Atypical CP eval 11/06 by Nunzio Cory- EKG w/ NSSTTWA, & NuclearStressTest neg- no ischemia or infarct, EF=70%... ~  5/12:  She denies CP, palpit, SOB, edema, etc;  We reviewed exercise recommendations...  HYPERCHOLESTEROLEMIA (ICD-272.0) - on SIMVASTATIN 20mg /d... she tries to follow a low carb, low fat diet... ~  baseline FLP 5/04 on diet alone TChol 270, TG 108, HDL 35, LDL 214... ~  FLP 9/08 on Simvastatin 20mg /d showed TChol 150, TG 92, HDL 36, LDL 95... ~  FLP 1/09 showed TChol 137, TG 89, HDL 32, LDL 87... rec- continue the Simva20. ~  FLP 5/10 showed TChol 124, TG 69, HDL 34, LDL 77 ~  FLP 5/11 showed TChol 141, TG 100, HDL 38, LDL 84 ~  FLP 11/11 showed TChol 126, TG 71, HDL 34, LDL 78 ~  FLP 5/12 on Simva20 showed TChol 125, TG 85, HDL 33, LDL 75 ~  FLP 11/12 on Simva20  showed TChol 127, TG 89, HDL 42, LDL 68  DIABETES MELLITUS, TYPE II, UNCONTROLLED (ICD-250.02) - prev eval for Endocrine by DrKrege... takes LANTUS 30u daily, plus METFORMIN 1000mg Bid & GLIMEPERIDE 4mg /d... she's been trying to follow diet, incr exercise & lose wt.... diet counselling from [facility]- Maggie May. ~  labs 9/08 showed BS=172 and HgA1c=7.5.Marland KitchenMarland Kitchen she never increased her Lantus, but states her BS at home improved to 100-120 range... ~  labs 1/09 showed BS= 132,  HgA1c= 6.8.Marland Kitchen. ~  labs 5/09 showed BS= 142, HgA1c= 7.0.Marland KitchenMarland Kitchen rec sl incr Lantus til FBS~ 100-110 (she didn't do this). ~  labs 11/09 showed BS= 109, A1c= 7.2 ~  labs 5/10 showed BS= 161, A1c= 7.8.Marland Kitchen. rec> incr Lantus slowly til FBS 100-120 (she never did). ~  labs 11/10 showed BS= 155, A1c= 8.0.Marland Kitchen. rec> incr Lantus to 26 now & titrate up. ~  labs 2/11 showed A1c= 7.7.Marland Kitchen. rec> incr  Lantus to 30. ~  labs 5/11 showed BS= 142, A1c= 7.3.Marland KitchenMarland Kitchen continue same, get wt down. ~  labs 11/11 showed BS= 121, A1c= 7.3.Marland KitchenMarland Kitchen same meds, diet, exerc, wt reduction. ~  labs 5/12 on Lant30+Met1000Bid+Glim4 showed BS=119, A1c=7.3 ~  Labs 11/12 on Lant30+Met1000+Glim4 showed BS=108, A1c=7.2  OBESITY (ICD-278.00) - we've discussed diet + exercise & she's been to [facility] for counselling... ~  11/11:  Weight =182#... 5' tall > BMI~ 35... ~  5/12:  Weight = 180# ~  11/12:  Weight = 181#  GERD (ICD-530.81) - uses Prilosec OTC 20mg /d as needed... no prev EGD.  DIVERTICULOSIS, COLON (ICD-562.10) - last colnoscopy 3/05 by DrSam w/ divertics only, f/u planned 28yrs... she has some mild constipation & uses "smooth move"...  DEGENERATIVE JOINT DISEASE, KNEE (ICD-715.96) - eval 2006 by DrDraper... Rx OTC NSAID's as needed...  ~  11/09 c/o left shoulder pain... rec to try Etodolac and refer to DrSypher- PT helped.  BAKER'S CYST (ICD-727.51) w/ prev eval from DrMurphy/ Ortho...  SPONDYLOSIS, LUMBAR (ICD-721.3) - eval by DrYates 1990 w/ conservative rx rec...  DISC DISEASE, CERVICAL (ICD-722.4) - she notes that massage helps...  Hx of OSTEOPENIA (ICD-733.90) - she takes Calcium, MVI, Vit D... ~  BMD 2004 w/ TScore -1.7 in spine... ~  BMD repeat 5/09 was WNL w/ TScores +0.0 in Spine,  &  +0.8 in right FemNeck. ~  labs 5/11 showed Vit D level = 40... continue MVI, Calcium, etc... ~  BMD 8/11 showed TScores +0.3 in Spine, and +1.1 in left FemNeck.  FACIAL PAIN, ATYPICAL (ICD-350.2) - eval 2001 by neuro/ DrSchmidt and resolved...  ANXIETY (ICD-300.00) - prev used Alprazolam Prn but off now... her husb passed away 6/10 & she has slowly recovered...  ONYCHOMYCOSIS (ICD-110.1) - eval by Podiatry & foot care clinic in the past... ~  2/11: she reports recent eval by DrTuckman for pain right lat malleolus- Rx Keflex.  NOTE:  I attempted to confirm med refills & compliance w/ her Pharm- she actually  uses 3 pharmacies and can't tell me why? [facility], [facility] [place], & [facility] as well... ~  Immunizations:  she get yearly seasonal Flu vaccines... had TETANUS shot 2004... PNEUMOVAX in 57 (age 83) & 2011 (age 70).   Past Surgical History  Procedure Date  . Vaginal hysterectomy 2002    Dr. Donny Pique repair  . Right eye surgery 09/2010    retina in right eye    Outpatient Encounter Prescriptions as of 02/18/2011  Medication Sig Dispense Refill  . amLODipine (NORVASC) 10 MG tablet  Take 1 tablet (10 mg total) by mouth daily.  30 tablet  6  . aspirin 81 MG tablet Take 81 mg by mouth daily.        . Calcium Carbonate-Vitamin D (CALCIUM-VITAMIN D) 500-200 MG-UNIT per tablet Take 1 tablet by mouth 2 (two) times daily with a meal.        . glimepiride (AMARYL) 4 MG tablet Take 1 tablet (4 mg total) by mouth daily before breakfast.  30 tablet  11  . glucose blood (FREESTYLE LITE) test strip Use as instructed  100 each  12  . insulin glargine (LANTUS) 100 UNIT/ML injection        . lisinopril-hydrochlorothiazide (PRINZIDE,ZESTORETIC) 20-12.5 MG per tablet TAKE ONE TABLET BY MOUTH EVERY DAY  30 tablet  6  . metFORMIN (GLUCOPHAGE) 1000 MG tablet Take 1 tablet (1,000 mg total) by mouth 2 (two) times daily with a meal.  180 tablet  3  . Multiple Vitamin (MULTIVITAMIN PO) Take 1 tablet by mouth daily.        . simvastatin (ZOCOR) 20 MG tablet TAKE ONE TABLET BY MOUTH AT BEDTIME  30 tablet  6  . travoprost, benzalkonium, (TRAVATAN) 0.004 % ophthalmic solution Place 1 drop into both eyes at bedtime.        Marland Kitchen DISCONTD: LANTUS 100 UNIT/ML injection INJECT 15 UNITS SUBCUTANEOULY EACH NIGHT AT BEDTIME  10 mL  3  . DISCONTD: LANTUS 100 UNIT/ML injection INJECT 15 UNITS SUBCUTANEOULY EACH NIGHT AT BEDTIME  10 mL  5    No Known Allergies   Current Medications, Allergies, Past Medical History, Past Surgical History, Family History, and Social History were reviewed in [employer] record.    Review of Systems        See HPI - all other systems neg except as noted... The patient denies anorexia, fever, weight loss, weight gain, vision loss, decreased hearing, hoarseness, chest pain, syncope, dyspnea on exertion, peripheral edema, prolonged cough, headaches, hemoptysis, abdominal pain, melena, hematochezia, severe indigestion/heartburn, hematuria, incontinence, muscle weakness, suspicious skin lesions, transient blindness, difficulty walking, depression, unusual weight change, abnormal bleeding, enlarged lymph nodes, and angioedema.     Objective:   Physical Exam     WD, Overwt, 76 y/o BF in NAD... Vital Signs:  Reviewed... GENERAL:  Alert & oriented; pleasant & cooperative... HEENT:  [place]/AT, EOM-wnl, PERRLA, EACs-clear, TMs-wnl, NOSE-clear, THROAT-clear & wnl. NECK:  Supple w/ fairROM; no JVD; normal carotid impulses w/o bruits; no thyromegaly or nodules palpated; no lymphadenopathy. CHEST:  Clear to P & A; without wheezes/ rales/ or rhonchi. HEART:  Regular Rhythm; without murmurs/ rubs/ or gallops. ABDOMEN:  Soft & nontender, w/ +panniculus, normal bowel sounds; no organomegaly or masses detected. EXT: without deformities, mild arthritic changes; no varicose veins/ venous insuffic/ or edema. NEURO:  CN's intact;  no focal neuro deficits... DERM:  No lesions noted; no rash etc...   Assessment & Plan:   GLAUCOMA & Exudates noted>  On eye drops per DrGroat; She was referred to DrSanders for retina eval & he did some kind of surg on her right eye in his office...  HBP>  Controlled on CCB, ACE, Diuretic; continue same...  Hx CP>  Denies angina, needs incr exercise program...  CHOL>  Looks good on Simva20...  DM>  Control fair on 3 meds, needs better diet & get wt down!  Obesity>  As above...  GI>  GERD, Constip, Divertics>  She notes some constip 7 we discussed fiber etc..Marland Kitchen  ORTHO>  She is off the etodolac & uses Tylenol, Aleve,  etc..Marland Kitchen

## 2011-02-18 NOTE — Patient Instructions (Signed)
Today we updated your med list in our EPIC system...    Continue your current medications the same...    We refilled your meds per request...  Today we did your follow up fasting blood work...    Please call the [facility] in a few days for your results...    Dial [id number] & when prompted enter your patient number followed by the # symbol...    Your patient number is:  [id number]#  Let's get on track w/ our diet & exercise program...    The goal is to lose 5-10 lbs...  Call for any questions...  Let's plan another follow up visit in 6 months.Marland KitchenMarland Kitchen

## 2011-03-07 ENCOUNTER — Encounter: Payer: Self-pay | Admitting: Pulmonary Disease

## 2011-08-23 ENCOUNTER — Encounter: Payer: Self-pay | Admitting: Pulmonary Disease

## 2011-08-23 ENCOUNTER — Ambulatory Visit (INDEPENDENT_AMBULATORY_CARE_PROVIDER_SITE_OTHER): Payer: Medicare Other | Admitting: Pulmonary Disease

## 2011-08-23 ENCOUNTER — Other Ambulatory Visit (INDEPENDENT_AMBULATORY_CARE_PROVIDER_SITE_OTHER): Payer: Medicare Other

## 2011-08-23 ENCOUNTER — Ambulatory Visit (INDEPENDENT_AMBULATORY_CARE_PROVIDER_SITE_OTHER)
Admission: RE | Admit: 2011-08-23 | Discharge: 2011-08-23 | Disposition: A | Discharge status: Home / Self Care | Payer: Medicare Other | Source: Ambulatory Visit | Attending: Pulmonary Disease | Admitting: Pulmonary Disease

## 2011-08-23 VITALS — BP 124/70 | HR 70 | Temp 97.0°F | Ht 61.0 in | Wt 181.6 lb

## 2011-08-23 DIAGNOSIS — E7800 Pure hypercholesterolemia, unspecified: Secondary | ICD-10-CM

## 2011-08-23 DIAGNOSIS — F411 Generalized anxiety disorder: Secondary | ICD-10-CM

## 2011-08-23 DIAGNOSIS — IMO0002 Reserved for concepts with insufficient information to code with codable children: Secondary | ICD-10-CM

## 2011-08-23 DIAGNOSIS — K219 Gastro-esophageal reflux disease without esophagitis: Secondary | ICD-10-CM

## 2011-08-23 DIAGNOSIS — K5730 Diverticulosis of large intestine without perforation or abscess without bleeding: Secondary | ICD-10-CM

## 2011-08-23 DIAGNOSIS — E669 Obesity, unspecified: Secondary | ICD-10-CM | POA: Insufficient documentation

## 2011-08-23 DIAGNOSIS — M5030 Other cervical disc degeneration, unspecified cervical region: Secondary | ICD-10-CM

## 2011-08-23 DIAGNOSIS — M899 Disorder of bone, unspecified: Secondary | ICD-10-CM

## 2011-08-23 DIAGNOSIS — IMO0001 Reserved for inherently not codable concepts without codable children: Secondary | ICD-10-CM

## 2011-08-23 DIAGNOSIS — I10 Essential (primary) hypertension: Secondary | ICD-10-CM

## 2011-08-23 DIAGNOSIS — M1710 Unilateral primary osteoarthritis, unspecified knee: Secondary | ICD-10-CM

## 2011-08-23 DIAGNOSIS — E663 Overweight: Secondary | ICD-10-CM

## 2011-08-23 DIAGNOSIS — M949 Disorder of cartilage, unspecified: Secondary | ICD-10-CM

## 2011-08-23 DIAGNOSIS — Z23 Encounter for immunization: Secondary | ICD-10-CM

## 2011-08-23 LAB — HEPATIC FUNCTION PANEL
ALT: 46 U/L — ABNORMAL HIGH (ref 0–35)
AST: 35 U/L (ref 0–37)
Albumin: 4 g/dL (ref 3.5–5.2)
Alkaline Phosphatase: 45 U/L (ref 39–117)
Bilirubin, Direct: 0.1 mg/dL (ref 0.0–0.3)
Total Bilirubin: 0.7 mg/dL (ref 0.3–1.2)
Total Protein: 7.8 g/dL (ref 6.0–8.3)

## 2011-08-23 LAB — CBC WITH DIFFERENTIAL/PLATELET
Basophils Absolute: 0.1 10*3/uL (ref 0.0–0.1)
Basophils Relative: 1 % (ref 0.0–3.0)
Eosinophils Absolute: 0.1 10*3/uL (ref 0.0–0.7)
Eosinophils Relative: 2.2 % (ref 0.0–5.0)
HCT: 41.5 % (ref 36.0–46.0)
Hemoglobin: 13.4 g/dL (ref 12.0–15.0)
Lymphocytes Relative: 40.8 % (ref 12.0–46.0)
Lymphs Abs: 2.3 10*3/uL (ref 0.7–4.0)
MCHC: 32.3 g/dL (ref 30.0–36.0)
MCV: 82.8 fl (ref 78.0–100.0)
Monocytes Absolute: 0.3 10*3/uL (ref 0.1–1.0)
Monocytes Relative: 6.1 % (ref 3.0–12.0)
Neutro Abs: 2.9 10*3/uL (ref 1.4–7.7)
Neutrophils Relative %: 49.9 % (ref 43.0–77.0)
Platelets: 199 10*3/uL (ref 150.0–400.0)
RBC: 5.02 Mil/uL (ref 3.87–5.11)
RDW: 13.8 % (ref 11.5–14.6)
WBC: 5.7 10*3/uL (ref 4.5–10.5)

## 2011-08-23 LAB — LIPID PANEL
Cholesterol: 113 mg/dL (ref 0–200)
HDL: 40.5 mg/dL (ref >39.00)
LDL Cholesterol: 62 mg/dL (ref 0–99)
Total CHOL/HDL Ratio: 3
Triglycerides: 52 mg/dL (ref 0.0–149.0)
VLDL: 10.4 mg/dL (ref 0.0–40.0)

## 2011-08-23 LAB — BASIC METABOLIC PANEL
BUN: 15 mg/dL (ref 6–23)
CO2: 27 mEq/L (ref 19–32)
Calcium: 9.4 mg/dL (ref 8.4–10.5)
Chloride: 99 mEq/L (ref 96–112)
Creatinine, Ser: 0.7 mg/dL (ref 0.4–1.2)
GFR: 91.31 mL/min (ref >60.00)
Glucose, Bld: 106 mg/dL — ABNORMAL HIGH (ref 70–99)
Potassium: 4.3 mEq/L (ref 3.5–5.1)
Sodium: 135 mEq/L (ref 135–145)

## 2011-08-23 LAB — TSH: TSH: 1.09 u[IU]/mL (ref 0.35–5.50)

## 2011-08-23 LAB — HEMOGLOBIN A1C: Hgb A1c MFr Bld: 7.2 % — ABNORMAL HIGH (ref 4.6–6.5)

## 2011-08-23 NOTE — Progress Notes (Signed)
Subjective:    Patient ID: Whitney [name], female    DOB: Jun 01, 1934, 77 Whitney [name].o.   MRN: [id number]  HPI 22 Whitney [name]/o BF here for a follow up visit... she has mult med problems as noted below...    ~  [name]  9, 2011:  she's feeling better, still getting [name] counselling at [name], & wt down 2# to 189#... she notes home BS betw 70-190 on 30u Lantus, etc... BP controlled on meds;  Chol OK on the Simva20;  DM control improved w/ A1c down to 7.3 now...  ~  February 16, 2010:  her weight is down 7# to 182# now & improved on diet & exercise (walking & aerobics)... she notes BSs are "OK" and FBS=121, A1c=7.3.Marland KitchenMarland Kitchen BP well controlled on meds;  no CP, palpit, SOB, edema, etc... we discussed both 2011 Flu vaccine & Pneumoax today...  ~  [name] 8, 2012:  103mo ROV & she reports a good interval w/o new complaints or concerns; states she's been active & exerc at the [facility] [name] but weight about the same & we reviewed diet & exercise prescription for her...  Due for fasting labs- see below:  ~  February 18, 2011:  103mo ROV & she reports prob w/ right eye per Whitney [name] (exudates seen) & sent to Whitney [name] w/ some kind of ?retina surg done in office (we don't have notes from him); weight unchanged in the 180# range & we will refer to Whitney [name] for help w/ diet; FLP looks good on Simva20, BSs ok on Lantus + Metform/ Glimep...  See prob list below>>  ~  [name] 13, 2013:  103mo ROV & Whitney [name] has had a stable interval w/o new complaints or concerns;  She saw Whitney [name] for Retina f/u & she tells me he released her back to Whitney [name]'s care (the retina surg 6/12 was successful);  She is doing well on her current meds- not checking BP at home but it is fine here, and home accuchecks are "ok" per pt...  We reviewed prob list, meds, xrays & labs> see prob list below>> CXR 5/13 showed normal heart size, clear lungs (sl incr markings) w/ low lung vols, NAD... LABS 5/13:  FLP- parameters at goals on Simva20;  Chems- ok x BS=106 A1c=7.2 on Lantus & ;   CBC- wnl;  TSH=1.09;  VitD=66          Problem List:  GLAUCOMA (ICD-365.9) - eval by Whitney [name] & Rx w/ Travatan eye drops...  ~  2012:  She developed some exudates in right eye & Whitney [name] sent her to Whitney [name] 6/12 who did some type of surg on her retina in his office (we don't have notes to review) ==> she reports that Whitney [name] released her back to [facility] [name]'s care, doing well...  HYPERTENSION (ICD-401.9) - on NORVASC 10mg /d  & LISINOPRIL/HCT 20-12.5 daily... ~  Last CXR 2002 showed heart at upper lim normal, clear lungs, NAD... ~  1/11:  went to ER w/ incr BP readings 170-180/ 90-100 & Norvasc incr to 10mg /d... ~  2/11:  BP 140/88 & we decided to change the Lisinopril20 to LISINOPRIL/ HCT 20-12.5 daily;  All BP reading since then have been WNL... ~  11/11: BP=120/68 and similar at home she says... denies HA, fatigue, visual changes, CP, palipit, dizziness, syncope, dyspnea, edema, etc... ~  5/12:  BP= 122/62 & feeling well, remains asymptomatic... ~  11/12:  BP= 130/82 & she remains asymptomatic... ~  5/13:  BP= 124/70 & feeling well w/o CP, palpit, dizzy,  SOB, edema, etc; she walks regularly w/ [name]. ~  CXR 5/13 showed normal heart size, clear lungs (sl incr markings) w/ low lung vols, NAD...  CHEST PAIN (ICD-786.50) - on ASA 81mg /d... Atypical CP eval 11/06 by Whitney [name]- EKG w/ NSSTTWA, & NuclearStressTest neg- no ischemia or infarct, EF=70%... ~  5/12:  She denies CP, palpit, SOB, edema, etc;  We reviewed exercise recommendations...  HYPERCHOLESTEROLEMIA (ICD-272.0) - on SIMVASTATIN 20mg /d... she tries to follow a low carb, low fat diet... ~  baseline FLP 5/04 on diet alone TChol 270, TG 108, HDL 35, LDL 214... ~  FLP 9/08 on Simvastatin 20mg /d showed TChol 150, TG 92, HDL 36, LDL 95... ~  FLP 1/09 showed TChol 137, TG 89, HDL 32, LDL 87... rec- continue the Simva20. ~  FLP 5/10 showed TChol 124, TG 69, HDL 34, LDL 77 ~  FLP 5/11 showed TChol 141, TG 100, HDL 38, LDL 84 ~  FLP 11/11  showed TChol 126, TG 71, HDL 34, LDL 78 ~  FLP 5/12 on Simva20 showed TChol 125, TG 85, HDL 33, LDL 75 ~  FLP 11/12 on Simva20 showed TChol 127, TG 89, HDL 42, LDL 68 ~  FLP 5/13 on Simva20 showed TChol 113, TG 52, HDL 41, LDL 62  DIABETES MELLITUS, TYPE II, UNCONTROLLED (ICD-250.02) - prev eval for Endocrine by DrKrege... takes LANTUS 30u daily, plus METFORMIN 1000mg Bid & GLIMEPERIDE 4mg /d... she's been trying to follow diet, incr exercise & lose wt.... diet counselling from [name]- [facility] [name]. ~  labs 9/08 showed BS=172 and HgA1c=7.5.Marland KitchenMarland Kitchen she never increased her Lantus, but states her BS at home improved to 100-120 range... ~  labs 1/09 showed BS= 132,  HgA1c= 6.8.Marland Kitchen. ~  labs 5/09 showed BS= 142, HgA1c= 7.0.Marland KitchenMarland Kitchen rec sl incr Lantus til FBS~ 100-110 (she didn't do this). ~  labs 11/09 showed BS= 109, A1c= 7.2 ~  labs 5/10 showed BS= 161, A1c= 7.8.Marland Kitchen. rec> incr Lantus slowly til FBS 100-120 (she never did). ~  labs 11/10 showed BS= 155, A1c= 8.0.Marland Kitchen. rec> incr Lantus to 26 now & titrate up. ~  labs 2/11 showed A1c= 7.7.Marland Kitchen. rec> incr Lantus to 30. ~  labs 5/11 showed BS= 142, A1c= 7.3.Marland KitchenMarland Kitchen continue same, get wt down. ~  labs 11/11 showed BS= 121, A1c= 7.3.Marland KitchenMarland Kitchen same meds, diet, exerc, wt reduction. ~  labs 5/12 on Lant30+Met1000Bid+Glim4 showed BS=119, A1c=7.3 ~  Labs 11/12 on Lant30+Met1000Bid+Glim4 showed BS=108, A1c=7.2 ~  Labs 5/13 on Lant30+Met1000Bid+Glim4 showed BS= 106, A1c=7.2  OBESITY (ICD-278.00) - we've discussed diet + exercise & she's been to [name] for counselling... ~  11/11:  Weight =182#... 5' tall > BMI~ 35... ~  5/12:  Weight = 180# ~  11/12:  Weight = 181# ~  5/13:  Weight = 182#  GERD (ICD-530.81) - uses Prilosec OTC 20mg /d as needed... no prev EGD.  DIVERTICULOSIS, COLON (ICD-562.10) - last colonoscopy 3/05 by DrSam w/ divertics only, f/u planned 17yrs... she has some mild constipation & uses "smooth move" and dulcolax as needed...  DEGENERATIVE JOINT DISEASE, KNEE  (ICD-715.96) - eval 2006 by DrDraper... Rx OTC NSAID's (Ibuprofen) as needed...  ~  11/09 c/o left shoulder pain... rec to try Etodolac and refer to DrSypher- PT helped. ~  5/13:  She states actually doing quite well, exercising at [name] & uses OTC Ibuprofen prn...  BAKER'S CYST (ICD-727.51) w/ prev eval from DrMurphy/ Ortho...  SPONDYLOSIS, LUMBAR (ICD-721.3) - eval by DrYates 1990 w/ conservative rx rec...  DISC DISEASE,  CERVICAL (ICD-722.4) - she notes that massage helps...  Hx of OSTEOPENIA (ICD-733.90) - she takes Calcium, MVI, Vit D... ~  BMD 2004 w/ TScore -1.7 in spine... ~  BMD repeat 5/09 was WNL w/ TScores +0.0 in Spine,  &  +0.8 in right FemNeck. ~  labs 5/11 showed Vit D level = 40... continue MVI, Calcium, etc... ~  BMD 8/11 showed TScores +0.3 in Spine, and +1.1 in left [facility], continue supplement Rx. ~  Labs 5/13 showed Vit D level = 66  FACIAL PAIN, ATYPICAL (ICD-350.2) - eval 2001 by neuro/ DrSchmidt and resolved...  ANXIETY (ICD-300.00) - prev used Alprazolam Prn but off now... her husb passed away 6/10 & she has slowly recovered...  ONYCHOMYCOSIS (ICD-110.1) - eval by Podiatry & foot care clinic in the past... ~  2/11: she reports recent eval by DrTuckman for pain right lat malleolus- Rx Keflex.  NOTE:  I attempted to confirm med refills & compliance w/ her Pharm- she actually uses 3 pharmacies and can't tell me why? [facility], [facility] [place], & [facility] as well... ~  Immunizations:  she gets yearly seasonal Flu vaccines... had TETANUS shot 2004 & given TDAP 5/13... PNEUMOVAX in 46 (age 64) & 2011 (age 62).   Past Surgical History  Procedure Date  . Vaginal hysterectomy 2002    Dr. Donny Pique repair  . Right eye surgery 09/2010    retina in right eye    Outpatient Encounter Prescriptions as of 08/23/2011  Medication Sig Dispense Refill  . amLODipine (NORVASC) 10 MG tablet Take 1 tablet (10 mg total) by mouth daily.  90 tablet  3  . aspirin 81 MG  tablet Take 81 mg by mouth daily.        . Calcium Carbonate-Vitamin D (CALCIUM-VITAMIN D) 500-200 MG-UNIT per tablet Take 1 tablet by mouth 2 (two) times daily with a meal.        . glimepiride (AMARYL) 4 MG tablet Take 1 tablet (4 mg total) by mouth daily before breakfast.  90 tablet  3  . glucose blood (FREESTYLE LITE) test strip Use as instructed  100 each  12  . insulin glargine (LANTUS) 100 UNIT/ML injection Inject 30 units of insulin at bedtime  30 mL  5  . lisinopril-hydrochlorothiazide (PRINZIDE,ZESTORETIC) 20-12.5 MG per tablet Take 1 tablet by mouth daily.  90 tablet  3  . metFORMIN (GLUCOPHAGE) 1000 MG tablet Take 1 tablet (1,000 mg total) by mouth 2 (two) times daily with a meal.  180 tablet  3  . Multiple Vitamin (MULTIVITAMIN PO) Take 1 tablet by mouth daily.        . simvastatin (ZOCOR) 20 MG tablet Take 1 tablet (20 mg total) by mouth at bedtime.  90 tablet  3  . travoprost, benzalkonium, (TRAVATAN) 0.004 % ophthalmic solution Place 1 drop into both eyes at bedtime.          No Known Allergies   Current Medications, Allergies, Past Medical History, Past Surgical History, Family History, and Social History were reviewed in [employer] record.    Review of Systems        See HPI - all other systems neg except as noted... The patient denies anorexia, fever, weight loss, weight gain, vision loss, decreased hearing, hoarseness, chest pain, syncope, dyspnea on exertion, peripheral edema, prolonged cough, headaches, hemoptysis, abdominal pain, melena, hematochezia, severe indigestion/heartburn, hematuria, incontinence, muscle weakness, suspicious skin lesions, transient blindness, difficulty walking, depression, unusual weight change, abnormal bleeding, enlarged lymph  nodes, and angioedema.     Objective:   Physical Exam     WD, Overwt, 70 Whitney [name]/o BF in NAD... Vital Signs:  Reviewed... GENERAL:  Alert & oriented; pleasant & cooperative... HEENT:  [place]/AT,  EOM-wnl, PERRLA, EACs-clear, TMs-wnl, NOSE-clear, THROAT-clear & wnl. NECK:  Supple w/ fairROM; no JVD; normal carotid impulses w/o bruits; no thyromegaly or nodules palpated; no lymphadenopathy. CHEST:  Clear to P & A; without wheezes/ rales/ or rhonchi. HEART:  Regular Rhythm; without murmurs/ rubs/ or gallops. ABDOMEN:  Soft & nontender, w/ +panniculus, normal bowel sounds; no organomegaly or masses detected. EXT: without deformities, mild arthritic changes; no varicose veins/ venous insuffic/ or edema. NEURO:  CN's intact;  no focal neuro deficits... DERM:  No lesions noted; no rash etc...  RADIOLOGY DATA:  Reviewed in the EPIC EMR & discussed w/ the patient...  LABORATORY DATA:  Reviewed in the EPIC EMR & discussed w/ the patient...   Assessment & Plan:   GLAUCOMA & Exudates noted>  Rx w/ eye drops per [name]; She was referred to Whitney [name] for retina eval & he did some kind of surg on her right eye 6/12 in his office (we don't have records); she tells me that he has released her back to [name]'s care, doing satis...  HBP>  Controlled on CCB, ACE, Diuretic; continue same...  Hx CP>  Denies angina, she's been exercising at the [name]...  CHOL>  Looks good on Simva20...  DM>  Control fair on 3 meds, needs better diet & get wt down!  Obesity>  As above, we discussed cutting back on caloric intake & increasing output...  GI>  GERD, Constip, Divertics>  She notes some constip & we discussed fiber etc...  ORTHO>  She is off the etodolac & uses Tylenol, Aleve, etc...   Patient's Medications  New Prescriptions   No medications on file  Previous Medications   AMLODIPINE (NORVASC) 10 MG TABLET    Take 1 tablet (10 mg total) by mouth daily.   ASPIRIN 81 MG TABLET    Take 81 mg by mouth daily.     CALCIUM CARBONATE-VITAMIN D (CALCIUM-VITAMIN D) 500-200 MG-UNIT PER TABLET    Take 1 tablet by mouth 2 (two) times daily with a meal.     GLIMEPIRIDE (AMARYL) 4 MG TABLET    Take 1 tablet (4 mg  total) by mouth daily before breakfast.   GLUCOSE BLOOD (FREESTYLE LITE) TEST STRIP    Use as instructed   INSULIN GLARGINE (LANTUS) 100 UNIT/ML INJECTION    Inject 30 units of insulin at bedtime   LISINOPRIL-HYDROCHLOROTHIAZIDE (PRINZIDE,ZESTORETIC) 20-12.5 MG PER TABLET    Take 1 tablet by mouth daily.   METFORMIN (GLUCOPHAGE) 1000 MG TABLET    Take 1 tablet (1,000 mg total) by mouth 2 (two) times daily with a meal.   MULTIPLE VITAMIN (MULTIVITAMIN PO)    Take 1 tablet by mouth daily.     SIMVASTATIN (ZOCOR) 20 MG TABLET    Take 1 tablet (20 mg total) by mouth at bedtime.   TRAVOPROST, BENZALKONIUM, (TRAVATAN) 0.004 % OPHTHALMIC SOLUTION    Place 1 drop into both eyes at bedtime.    Modified Medications   No medications on file  Discontinued Medications   No medications on file

## 2011-08-23 NOTE — Patient Instructions (Signed)
Today we updated your med list in our EPIC system...    Continue your current medications the same...  Today we did your follow up CXR & FASTING blood work...   We will call you w/ the results when avail...  Today we gave you the combination TETANUS vaccine called the TDAP> it should be good for 10 yrs...  Call for any questions...  Let's get on track w/ our diet & exercise program...    The goal is to lose 10-15 lbs!!!  Let's plan a follow up visit in 6 months.Marland KitchenMarland Kitchen

## 2011-08-24 LAB — VITAMIN D 25 HYDROXY (VIT D DEFICIENCY, FRACTURES): Vit D, 25-Hydroxy: 66 ng/mL (ref 30–89)

## 2011-08-27 ENCOUNTER — Telehealth: Payer: Self-pay | Admitting: Pulmonary Disease

## 2011-08-27 NOTE — Telephone Encounter (Signed)
Per TP have patient elevate arm, Tylenol as needed, warm compresses, and call for Ov if no better. Pt is aware of recs.

## 2011-08-27 NOTE — Telephone Encounter (Signed)
Spoke with patient; states she got Tdap shot on Monday 08-23-11; had soreness afterwards, but has noticed her Right arm(where given) is sore and swollen in the armpit area. Pt states she has not noticed any redness or streaky area on the arm; not warm to touch. TP please advise. Thanks.

## 2011-12-14 ENCOUNTER — Other Ambulatory Visit: Payer: Self-pay | Admitting: Pulmonary Disease

## 2011-12-14 DIAGNOSIS — Z1231 Encounter for screening mammogram for malignant neoplasm of breast: Secondary | ICD-10-CM

## 2012-01-13 ENCOUNTER — Ambulatory Visit
Admission: RE | Admit: 2012-01-13 | Discharge: 2012-01-13 | Disposition: A | Discharge status: Home / Self Care | Payer: Medicare Other | Source: Ambulatory Visit | Attending: Pulmonary Disease | Admitting: Pulmonary Disease

## 2012-01-13 DIAGNOSIS — Z1231 Encounter for screening mammogram for malignant neoplasm of breast: Secondary | ICD-10-CM

## 2012-02-15 ENCOUNTER — Telehealth: Payer: Self-pay | Admitting: Pulmonary Disease

## 2012-02-15 NOTE — Telephone Encounter (Signed)
Spoke with pharmacy-gave verbal to refill for test strips.

## 2012-02-22 ENCOUNTER — Encounter: Payer: Self-pay | Admitting: *Deleted

## 2012-02-23 ENCOUNTER — Other Ambulatory Visit (INDEPENDENT_AMBULATORY_CARE_PROVIDER_SITE_OTHER): Payer: Medicare Other

## 2012-02-23 ENCOUNTER — Ambulatory Visit (INDEPENDENT_AMBULATORY_CARE_PROVIDER_SITE_OTHER): Payer: Medicare Other | Admitting: Pulmonary Disease

## 2012-02-23 ENCOUNTER — Encounter: Payer: Self-pay | Admitting: Pulmonary Disease

## 2012-02-23 VITALS — BP 124/76 | HR 73 | Temp 97.0°F | Ht 61.0 in | Wt 178.6 lb

## 2012-02-23 DIAGNOSIS — E7800 Pure hypercholesterolemia, unspecified: Secondary | ICD-10-CM

## 2012-02-23 DIAGNOSIS — E663 Overweight: Secondary | ICD-10-CM

## 2012-02-23 DIAGNOSIS — I10 Essential (primary) hypertension: Secondary | ICD-10-CM

## 2012-02-23 DIAGNOSIS — M47817 Spondylosis without myelopathy or radiculopathy, lumbosacral region: Secondary | ICD-10-CM

## 2012-02-23 DIAGNOSIS — M899 Disorder of bone, unspecified: Secondary | ICD-10-CM

## 2012-02-23 DIAGNOSIS — E119 Type 2 diabetes mellitus without complications: Secondary | ICD-10-CM

## 2012-02-23 DIAGNOSIS — IMO0002 Reserved for concepts with insufficient information to code with codable children: Secondary | ICD-10-CM

## 2012-02-23 DIAGNOSIS — F411 Generalized anxiety disorder: Secondary | ICD-10-CM

## 2012-02-23 DIAGNOSIS — K5730 Diverticulosis of large intestine without perforation or abscess without bleeding: Secondary | ICD-10-CM

## 2012-02-23 DIAGNOSIS — E1169 Type 2 diabetes mellitus with other specified complication: Secondary | ICD-10-CM | POA: Insufficient documentation

## 2012-02-23 DIAGNOSIS — M5030 Other cervical disc degeneration, unspecified cervical region: Secondary | ICD-10-CM

## 2012-02-23 DIAGNOSIS — Z23 Encounter for immunization: Secondary | ICD-10-CM

## 2012-02-23 DIAGNOSIS — K219 Gastro-esophageal reflux disease without esophagitis: Secondary | ICD-10-CM

## 2012-02-23 DIAGNOSIS — M1710 Unilateral primary osteoarthritis, unspecified knee: Secondary | ICD-10-CM

## 2012-02-23 LAB — BASIC METABOLIC PANEL
BUN: 10 mg/dL (ref 6–23)
CO2: 28 mEq/L (ref 19–32)
Calcium: 9.4 mg/dL (ref 8.4–10.5)
Chloride: 102 mEq/L (ref 96–112)
Creatinine, Ser: 0.7 mg/dL (ref 0.4–1.2)
GFR: 86.69 mL/min (ref >60.00)
Glucose, Bld: 110 mg/dL — ABNORMAL HIGH (ref 70–99)
Potassium: 3.9 mEq/L (ref 3.5–5.1)
Sodium: 138 mEq/L (ref 135–145)

## 2012-02-23 LAB — HEMOGLOBIN A1C: Hgb A1c MFr Bld: 7.2 % — ABNORMAL HIGH (ref 4.6–6.5)

## 2012-02-23 NOTE — Progress Notes (Signed)
Subjective:    Patient ID: Whitney [name], female    DOB: [birth year], 77 y.o.   MRN: [id number]  HPI 77 y/o BF here for a follow up visit... she has mult med problems as noted below...    ~  Aug 18, 2009:  she's feeling better, still getting nutrition counselling at [facility], & wt down 2# to 189#... she notes home BS betw 70-190 on 30u Lantus, etc... BP controlled on meds;  Chol OK on the Simva20;  DM control improved w/ A1c down to 7.3 now...  ~  February 16, 2010:  her weight is down 7# to 182# now & improved on diet & exercise (walking & aerobics)... she notes BSs are "OK" and FBS=121, A1c=7.3.Marland KitchenMarland Kitchen BP well controlled on meds;  no CP, palpit, SOB, edema, etc... we discussed both 2011 Flu vaccine & Pneumoax today...  ~  Aug 18, 2010:  139mo ROV & she reports a good interval w/o new complaints or concerns; [facility] but [facility] about the same & we reviewed diet & exercise prescription for her...  Due for fasting labs- see below:  ~  February 18, 2011:  139mo ROV & she reports prob w/ right eye per DrGroat (exudates seen) & sent to DrSanders w/ some kind of ?retina surg done in office (we don't have notes from him); weight unchanged in the 180# range & we will refer to [facility] for help w/ diet; FLP looks good on Simva20, BSs ok on Lantus + Metform/ Glimep...  See prob list below>>  ~  Aug 23, 2011:  139mo ROV & Whitney [name] has had a stable interval w/o new complaints or concerns;  She saw DrSanders for Retina f/u & she tells me he released her back to DrGroat's care (the retina surg 6/12 was successful);  She is doing well on her current meds- not checking BP at home but it is fine here, and home accuchecks are "ok" per pt...  We reviewed prob list, meds, xrays & labs> see prob list below>> CXR 5/13 showed normal heart size, clear lungs (sl incr markings) w/ low lung vols, NAD... LABS 5/13:  FLP- parameters at goals on Simva20;  Chems- ok x BS=106 A1c=7.2 on Lantus & ;   CBC- wnl;  TSH=1.09;  VitD=66  ~  February 23, 2012:  139mo ROV & Whitney [name] now has a hearing aide for her left ear;  We reviewed the following medical problems today:    <HBP> on ASA81, Amlod10, LisinHct20-12.5; BP= 124/76 & she denies CP, palpit, SOB, edema, etc...    <Chol> on Simva20; last FLP 5/13 showed TChol 113, TG 52, HDL 41, LDL 62    <DM> on [id number], Metform1000Bid, Glim4; labs today showed BS=110, A1c=7.2; continue same meds, better diet...    <Overweight> wt is down 2# to 179# today; we reviewed diet, exercise, wt reduction strategies...    <DJD, LBP, Neck pain> she notes arthritic complaints are "better" at present using OTC analgesics...    <Hx Osteopenia> on Ca & MVI; last BMD 8/11 was normal...    <Anxiety> not on meds, she manages very well... We reviewed prob list, meds, xrays and labs> see below for updates >> ok Flu vaccine today... LABS 11/13:  Chems- ok w/ BS=110 A1c=7.2         Problem List:  GLAUCOMA (ICD-365.9) - eval by DrGroat & Rx w/ Travatan eye drops...  ~  2012:  She developed some exudates in  right eye & DrGroat sent her to DrSanders 6/12 who did some type of surg on her retina in his office (we don't have notes to review) ==> she reports that DrSanders released her back to DrGroat's care, doing well...  HEARING LOSS >> she was tested at [facility]; now has right ear hearing aide...  HYPERTENSION (ICD-401.9) - on NORVASC 10mg /d  & LISINOPRIL/HCT 20-12.5 daily... ~  Last CXR 2002 showed heart at upper lim normal, clear lungs, NAD... ~  1/11:  went to ER w/ incr BP readings 170-180/ 90-100 & Norvasc incr to 10mg /d... ~  2/11:  BP 140/88 & we decided to change the Lisinopril20 to LISINOPRIL/ HCT 20-12.5 daily;  All BP reading since then have been WNL... ~  11/11: BP=120/68 and similar at home she says... denies HA, fatigue, visual changes, CP, palipit, dizziness, syncope, dyspnea, edema, etc... ~  5/12:  BP= 122/62 & feeling well, remains asymptomatic... ~  11/12:   BP= 130/82 & she remains asymptomatic... ~  5/13:  BP= 124/70 & feeling well w/o CP, palpit, dizzy, SOB, edema, etc; she walks regularly w/ [name]. ~  CXR 5/13 showed normal heart size, clear lungs (sl incr markings) w/ low lung vols, NAD... ~  11/13:  BP= 124/76 & she denies CP, palpit, SOB, edema, etc...  CHEST PAIN (ICD-786.50) - on ASA 81mg /d... Atypical CP eval 11/06 by Nunzio Cory- EKG w/ NSSTTWA, & NuclearStressTest neg- no ischemia or infarct, EF=70%... ~  5/12:  She denies CP, palpit, SOB, edema, etc;  We reviewed exercise recommendations...  HYPERCHOLESTEROLEMIA (ICD-272.0) - on SIMVASTATIN 20mg /d... she tries to follow a low carb, low fat diet... ~  baseline FLP 5/04 on diet alone TChol 270, TG 108, HDL 35, LDL 214... ~  FLP 9/08 on Simvastatin 20mg /d showed TChol 150, TG 92, HDL 36, LDL 95... ~  FLP 1/09 showed TChol 137, TG 89, HDL 32, LDL 87... rec- continue the Simva20. ~  FLP 5/10 showed TChol 124, TG 69, HDL 34, LDL 77 ~  FLP 5/11 showed TChol 141, TG 100, HDL 38, LDL 84 ~  FLP 11/11 showed TChol 126, TG 71, HDL 34, LDL 78 ~  FLP 5/12 on Simva20 showed TChol 125, TG 85, HDL 33, LDL 75 ~  FLP 11/12 on Simva20 showed TChol 127, TG 89, HDL 42, LDL 68 ~  FLP 5/13 on Simva20 showed TChol 113, TG 52, HDL 41, LDL 62  DIABETES MELLITUS, TYPE II, UNCONTROLLED (ICD-250.02) - prev eval for Endocrine by DrKrege... takes LANTUS 30u daily, plus METFORMIN 1000mg Bid & GLIMEPERIDE 4mg /d... she's been trying to follow diet, incr exercise & lose wt.... diet counselling from [facility]- Maggie May. ~  labs 9/08 showed BS=172 and HgA1c=7.5.Marland KitchenMarland Kitchen she never increased her Lantus, but states her BS at home improved to 100-120 range... ~  labs 1/09 showed BS= 132,  HgA1c= 6.8.Marland Kitchen. ~  labs 5/09 showed BS= 142, HgA1c= 7.0.Marland KitchenMarland Kitchen rec sl incr Lantus til FBS~ 100-110 (she didn't do this). ~  labs 11/09 showed BS= 109, A1c= 7.2 ~  labs 5/10 showed BS= 161, A1c= 7.8.Marland Kitchen. rec> incr Lantus slowly til FBS 100-120 (she  never did). ~  labs 11/10 showed BS= 155, A1c= 8.0.Marland Kitchen. rec> incr Lantus to 26 now & titrate up. ~  labs 2/11 showed A1c= 7.7.Marland Kitchen. rec> incr Lantus to 30. ~  labs 5/11 showed BS= 142, A1c= 7.3.Marland KitchenMarland Kitchen continue same, get wt down. ~  labs 11/11 showed BS= 121, A1c= 7.3.Marland KitchenMarland Kitchen same meds, diet, exerc, wt reduction. ~  labs 5/12 on Lant30+Met1000Bid+Glim4 showed BS=119, A1c=7.3 ~  Labs 11/12 on Lant30+Met1000Bid+Glim4 showed BS=108, A1c=7.2 ~  Labs 5/13 on Lant30+Met1000Bid+Glim4 showed BS=106, A1c=7.2 ~  Labs 11/13 on Lant30+Met1000Bid+Glim4 showed BS=110, A1c=7.2  OBESITY (ICD-278.00) - we've discussed diet + exercise & she's been to [facility] for counselling... ~  11/11:  Weight =182#... 5' tall > BMI~ 35... ~  5/12:  Weight = 180# ~  11/12:  Weight = 181# ~  5/13:  Weight = 182# ~  11/13:  Weight = 179#  GERD (ICD-530.81) - uses Prilosec OTC 20mg /d as needed... no prev EGD.  DIVERTICULOSIS, COLON (ICD-562.10) - last colonoscopy 3/05 by DrSam w/ divertics only, f/u planned 3yrs... she has some mild constipation & uses "smooth move" and dulcolax as needed...  DEGENERATIVE JOINT DISEASE, KNEE (ICD-715.96) - eval 2006 by DrDraper... Rx OTC NSAID's (Ibuprofen) as needed...  ~  11/09 c/o left shoulder pain... rec to try Etodolac and refer to DrSypher- PT helped. ~  5/13:  She states actually doing quite well, [facility] & uses OTC Ibuprofen prn...  BAKER'S CYST (ICD-727.51) w/ prev eval from DrMurphy/ Ortho...  SPONDYLOSIS, LUMBAR (ICD-721.3) - eval by DrYates 1990 w/ conservative rx rec...  DISC DISEASE, CERVICAL (ICD-722.4) - she notes that massage helps...  Hx of OSTEOPENIA (ICD-733.90) - she takes Calcium, MVI, Vit D... ~  BMD 2004 w/ TScore -1.7 in spine... ~  BMD repeat 5/09 was WNL w/ TScores +0.0 in Spine,  &  +0.8 in right FemNeck. ~  labs 5/11 showed Vit D level = 40... continue MVI, Calcium, etc... ~  BMD 8/11 showed TScores +0.3 in Spine, and +1.1 in left [facility] [place], continue  supplement Rx. ~  Labs 5/13 showed Vit D level = 66  FACIAL PAIN, ATYPICAL (ICD-350.2) - eval 2001 by neuro/ DrSchmidt and resolved...  ANXIETY (ICD-300.00) - prev used Alprazolam Prn but off now... her husb passed away 6/10 & she has slowly recovered...  ONYCHOMYCOSIS (ICD-110.1) - eval by Podiatry & foot care clinic in the past... ~  2/11: she reports recent eval by DrTuckman for pain right lat malleolus- Rx Keflex.  NOTE:  I attempted to confirm med refills & compliance w/ her Pharm- she actually uses 3 pharmacies and can't tell me why? [facility], [facility] [place], & [facility] pharm as well... ~  Immunizations:  she gets yearly seasonal Flu vaccines... had TETANUS shot 2004 & given TDAP 5/13... PNEUMOVAX in 60 (age 28) & 2011 (age 62).   Past Surgical History  Procedure Date  . Vaginal hysterectomy 2002    Dr. Donny Pique repair  . Right eye surgery 09/2010    retina in right eye    Outpatient Encounter Prescriptions as of 02/23/2012  Medication Sig Dispense Refill  . amLODipine (NORVASC) 10 MG tablet Take 1 tablet (10 mg total) by mouth daily.  90 tablet  3  . aspirin 81 MG tablet Take 81 mg by mouth daily.        . Calcium Carbonate-Vitamin D (CALCIUM-VITAMIN D) 500-200 MG-UNIT per tablet Take 1 tablet by mouth 2 (two) times daily with a meal.        . glimepiride (AMARYL) 4 MG tablet Take 1 tablet (4 mg total) by mouth daily before breakfast.  90 tablet  3  . insulin glargine (LANTUS) 100 UNIT/ML injection Inject 30 units of insulin at bedtime  30 mL  5  . lisinopril-hydrochlorothiazide (PRINZIDE,ZESTORETIC) 20-12.5 MG per tablet Take 1 tablet by mouth daily.  90 tablet  3  . metFORMIN (GLUCOPHAGE) 1000 MG tablet Take 1 tablet (1,000 mg total) by mouth 2 (two) times daily with a meal.  180 tablet  3  . Multiple Vitamin (MULTIVITAMIN PO) Take 1 tablet by mouth daily.        . simvastatin (ZOCOR) 20 MG tablet Take 1 tablet (20 mg total) by mouth at bedtime.  90 tablet  3  .  travoprost, benzalkonium, (TRAVATAN) 0.004 % ophthalmic solution Place 1 drop into both eyes at bedtime.          No Known Allergies   Current Medications, Allergies, Past Medical History, Past Surgical History, Family History, and Social History were reviewed in [employer] record.    Review of Systems        See HPI - all other systems neg except as noted... The patient denies anorexia, fever, weight loss, weight gain, vision loss, decreased hearing, hoarseness, chest pain, syncope, dyspnea on exertion, peripheral edema, prolonged cough, headaches, hemoptysis, abdominal pain, melena, hematochezia, severe indigestion/heartburn, hematuria, incontinence, muscle weakness, suspicious skin lesions, transient blindness, difficulty walking, depression, unusual weight change, abnormal bleeding, enlarged lymph nodes, and angioedema.     Objective:   Physical Exam     WD, Overwt, 77 y/o BF in NAD... Vital Signs:  Reviewed... GENERAL:  Alert & oriented; pleasant & cooperative... HEENT:  [place]/AT, EOM-wnl, PERRLA, EACs-clear, TMs-wnl, NOSE-clear, THROAT-clear & wnl. NECK:  Supple w/ fairROM; no JVD; normal carotid impulses w/o bruits; no thyromegaly or nodules palpated; no lymphadenopathy. CHEST:  Clear to P & A; without wheezes/ rales/ or rhonchi. HEART:  Regular Rhythm; without murmurs/ rubs/ or gallops. ABDOMEN:  Soft & nontender, w/ +panniculus, normal bowel sounds; no organomegaly or masses detected. EXT: without deformities, mild arthritic changes; no varicose veins/ venous insuffic/ or edema. NEURO:  CN's intact;  no focal neuro deficits... DERM:  No lesions noted; no rash etc...  RADIOLOGY DATA:  Reviewed in the EPIC EMR & discussed w/ the patient...  LABORATORY DATA:  Reviewed in the EPIC EMR & discussed w/ the patient...   Assessment & Plan:    GLAUCOMA & Exudates noted>  Rx w/ eye drops per DrGroat; She was referred to DrSanders for retina eval & he did some  kind of surg on her right eye 6/12 in his office (we don't have records); she tells me that he has released her back to DrGroat's care, doing satis...  HBP>  Controlled on CCB, ACE, Diuretic; continue same...  Hx CP>  Denies angina, [facility]...  CHOL>  Looks good on Simva20...  DM>  Control fair on 3 meds, needs better diet & get wt down!  Obesity>  As above, we discussed cutting back on caloric intake & increasing output...  GI>  GERD, Constip, Divertics>  She notes some constip & we discussed fiber etc...  ORTHO>  She is off the etodolac & uses Tylenol, Aleve, etc...   Patient's Medications  New Prescriptions   No medications on file  Previous Medications   AMLODIPINE (NORVASC) 10 MG TABLET    Take 1 tablet (10 mg total) by mouth daily.   ASPIRIN 81 MG TABLET    Take 81 mg by mouth daily.     CALCIUM CARBONATE-VITAMIN D (CALCIUM-VITAMIN D) 500-200 MG-UNIT PER TABLET    Take 1 tablet by mouth 2 (two) times daily with a meal.     GLIMEPIRIDE (AMARYL) 4 MG TABLET    Take 1 tablet (4 mg total)  by mouth daily before breakfast.   INSULIN GLARGINE (LANTUS) 100 UNIT/ML INJECTION    Inject 30 units of insulin at bedtime   LISINOPRIL-HYDROCHLOROTHIAZIDE (PRINZIDE,ZESTORETIC) 20-12.5 MG PER TABLET    Take 1 tablet by mouth daily.   METFORMIN (GLUCOPHAGE) 1000 MG TABLET    Take 1 tablet (1,000 mg total) by mouth 2 (two) times daily with a meal.   MULTIPLE VITAMIN (MULTIVITAMIN PO)    Take 1 tablet by mouth daily.     SIMVASTATIN (ZOCOR) 20 MG TABLET    Take 1 tablet (20 mg total) by mouth at bedtime.   TRAVOPROST, BENZALKONIUM, (TRAVATAN) 0.004 % OPHTHALMIC SOLUTION    Place 1 drop into both eyes at bedtime.    Modified Medications   No medications on file  Discontinued Medications   No medications on file

## 2012-02-23 NOTE — Patient Instructions (Addendum)
Today we updated your med list in our EPIC system...    Continue your current medications the same...  Today we did your follow up metabolic & diabetic labs...    We will contact you w/ the results when avail...  Keep up the good work w/ diet & exercise...    The goal is to lose 5-10 lbs increments...  Call for any questions...  Let's plan another f/u visit in 6 months.Marland KitchenMarland Kitchen

## 2012-03-27 ENCOUNTER — Other Ambulatory Visit: Payer: Self-pay | Admitting: Pulmonary Disease

## 2012-04-15 ENCOUNTER — Other Ambulatory Visit: Payer: Self-pay | Admitting: Pulmonary Disease

## 2012-06-06 ENCOUNTER — Other Ambulatory Visit: Payer: Self-pay | Admitting: Pulmonary Disease

## 2012-08-23 ENCOUNTER — Encounter: Payer: Self-pay | Admitting: Pulmonary Disease

## 2012-08-23 ENCOUNTER — Other Ambulatory Visit (INDEPENDENT_AMBULATORY_CARE_PROVIDER_SITE_OTHER): Payer: Medicare Other

## 2012-08-23 ENCOUNTER — Ambulatory Visit (INDEPENDENT_AMBULATORY_CARE_PROVIDER_SITE_OTHER): Payer: Medicare Other | Admitting: Pulmonary Disease

## 2012-08-23 VITALS — BP 130/72 | HR 65 | Temp 97.4°F | Ht 61.0 in | Wt 178.2 lb

## 2012-08-23 DIAGNOSIS — M949 Disorder of cartilage, unspecified: Secondary | ICD-10-CM

## 2012-08-23 DIAGNOSIS — E119 Type 2 diabetes mellitus without complications: Secondary | ICD-10-CM

## 2012-08-23 DIAGNOSIS — M5030 Other cervical disc degeneration, unspecified cervical region: Secondary | ICD-10-CM

## 2012-08-23 DIAGNOSIS — M47817 Spondylosis without myelopathy or radiculopathy, lumbosacral region: Secondary | ICD-10-CM

## 2012-08-23 DIAGNOSIS — K5730 Diverticulosis of large intestine without perforation or abscess without bleeding: Secondary | ICD-10-CM

## 2012-08-23 DIAGNOSIS — K219 Gastro-esophageal reflux disease without esophagitis: Secondary | ICD-10-CM

## 2012-08-23 DIAGNOSIS — I10 Essential (primary) hypertension: Secondary | ICD-10-CM

## 2012-08-23 DIAGNOSIS — E7800 Pure hypercholesterolemia, unspecified: Secondary | ICD-10-CM

## 2012-08-23 DIAGNOSIS — F411 Generalized anxiety disorder: Secondary | ICD-10-CM

## 2012-08-23 DIAGNOSIS — E663 Overweight: Secondary | ICD-10-CM

## 2012-08-23 DIAGNOSIS — IMO0002 Reserved for concepts with insufficient information to code with codable children: Secondary | ICD-10-CM

## 2012-08-23 DIAGNOSIS — M899 Disorder of bone, unspecified: Secondary | ICD-10-CM

## 2012-08-23 DIAGNOSIS — M1710 Unilateral primary osteoarthritis, unspecified knee: Secondary | ICD-10-CM

## 2012-08-23 LAB — URINALYSIS, ROUTINE W REFLEX MICROSCOPIC
Bilirubin Urine: NEGATIVE
Hgb urine dipstick: NEGATIVE
Ketones, ur: NEGATIVE
Nitrite: NEGATIVE
Specific Gravity, Urine: 1.01 (ref 1.000–1.030)
Total Protein, Urine: NEGATIVE
Urine Glucose: NEGATIVE
Urobilinogen, UA: 0.2 (ref 0.0–1.0)
pH: 6.5 (ref 5.0–8.0)

## 2012-08-23 LAB — BASIC METABOLIC PANEL
BUN: 9 mg/dL (ref 6–23)
CO2: 28 mEq/L (ref 19–32)
Calcium: 10.1 mg/dL (ref 8.4–10.5)
Chloride: 103 mEq/L (ref 96–112)
Creatinine, Ser: 0.8 mg/dL (ref 0.4–1.2)
GFR: 74.21 mL/min (ref >60.00)
Glucose, Bld: 106 mg/dL — ABNORMAL HIGH (ref 70–99)
Potassium: 3.7 mEq/L (ref 3.5–5.1)
Sodium: 139 mEq/L (ref 135–145)

## 2012-08-23 LAB — CBC WITH DIFFERENTIAL/PLATELET
Basophils Absolute: 0 10*3/uL (ref 0.0–0.1)
Basophils Relative: 0.5 % (ref 0.0–3.0)
Eosinophils Absolute: 0.2 10*3/uL (ref 0.0–0.7)
Eosinophils Relative: 2.9 % (ref 0.0–5.0)
HCT: 40.2 % (ref 36.0–46.0)
Hemoglobin: 13.5 g/dL (ref 12.0–15.0)
Lymphocytes Relative: 25.8 % (ref 12.0–46.0)
Lymphs Abs: 1.6 10*3/uL (ref 0.7–4.0)
MCHC: 33.6 g/dL (ref 30.0–36.0)
MCV: 80.5 fl (ref 78.0–100.0)
Monocytes Absolute: 0.4 10*3/uL (ref 0.1–1.0)
Monocytes Relative: 6.1 % (ref 3.0–12.0)
Neutro Abs: 4 10*3/uL (ref 1.4–7.7)
Neutrophils Relative %: 64.7 % (ref 43.0–77.0)
Platelets: 212 10*3/uL (ref 150.0–400.0)
RBC: 4.99 Mil/uL (ref 3.87–5.11)
RDW: 13.8 % (ref 11.5–14.6)
WBC: 6.2 10*3/uL (ref 4.5–10.5)

## 2012-08-23 LAB — HEPATIC FUNCTION PANEL
ALT: 50 U/L — ABNORMAL HIGH (ref 0–35)
AST: 44 U/L — ABNORMAL HIGH (ref 0–37)
Albumin: 3.9 g/dL (ref 3.5–5.2)
Alkaline Phosphatase: 44 U/L (ref 39–117)
Bilirubin, Direct: 0.2 mg/dL (ref 0.0–0.3)
Total Bilirubin: 1.3 mg/dL — ABNORMAL HIGH (ref 0.3–1.2)
Total Protein: 7.5 g/dL (ref 6.0–8.3)

## 2012-08-23 LAB — LIPID PANEL
Cholesterol: 117 mg/dL (ref 0–200)
HDL: 34.3 mg/dL — ABNORMAL LOW (ref >39.00)
LDL Cholesterol: 61 mg/dL (ref 0–99)
Total CHOL/HDL Ratio: 3
Triglycerides: 111 mg/dL (ref 0.0–149.0)
VLDL: 22.2 mg/dL (ref 0.0–40.0)

## 2012-08-23 LAB — HEMOGLOBIN A1C: Hgb A1c MFr Bld: 7.3 % — ABNORMAL HIGH (ref 4.6–6.5)

## 2012-08-23 LAB — TSH: TSH: 1.22 u[IU]/mL (ref 0.35–5.50)

## 2012-08-23 NOTE — Patient Instructions (Addendum)
Today we updated your med list in our EPIC system...    Continue your current medications the same...  Today we did your follow up FASTING blood work...    We will contact you w/ the results when available...   Stay as active as possible...  Call for any questions...  Let's plan a follow up visit in 49mo, sooner if needed for problems.Marland KitchenMarland Kitchen

## 2012-08-23 NOTE — Progress Notes (Signed)
Subjective:    Patient ID: Whitney [name], female    DOB: 04/25/1934, 78 y.o.   MRN: [id number]  HPI 78 y/o BF here for a follow up visit... she has mult med problems as noted below...    ~  February 18, 2011:  73mo ROV & she reports prob w/ right eye per Whitney [name] (exudates seen) & sent to Whitney [name] w/ some kind of ?retina surg done in office (we don't have notes from him); weight unchanged in the 180# range & we will refer to Whitney [name] for help w/ diet; FLP looks good on Simva20, BSs ok on Lantus + Metform/ Glimep...  See prob list below>>  ~  [name] 13, 2013:  73mo ROV & Whitney [name] has had a stable interval w/o new complaints or concerns;  She saw Whitney [name] for Retina f/u & she tells me he released her back to Whitney [name]'s care (the retina surg 6/12 was successful);  She is doing well on her current meds- not checking BP at home but it is fine here, and home accuchecks are "ok" per pt...  We reviewed prob list, meds, xrays & labs> see prob list below>> CXR 5/13 showed normal heart size, clear lungs (sl incr markings) w/ low lung vols, NAD... LABS 5/13:  FLP- parameters at goals on Simva20;  Chems- ok x BS=106 A1c=7.2 on Lantus & ;  CBC- wnl;  TSH=1.09;  VitD=66  ~  February 23, 2012:  73mo ROV & Whitney [name] now has a hearing aide for her left ear;  We reviewed the following medical problems today:    HBP> on ASA81, Amlod10, LisinHct20-12.5; BP= 124/76 & she denies CP, palpit, SOB, edema, etc...    Chol> on Simva20; last FLP 5/13 showed TChol 113, TG 52, HDL 41, LDL 62    DM> on Lantus30, Metform1000Bid, Glim4; labs today showed BS=110, A1c=7.2; continue same meds, better diet...    Overweight> wt is down 2# to 179# today; we reviewed diet, exercise, wt reduction strategies...    DJD, LBP, Neck pain> she notes arthritic complaints are "better" at present using OTC analgesics...    Hx Osteopenia> on Ca & MVI; last BMD 8/11 was normal...    Anxiety> not on meds, she manages very well... We reviewed prob  list, meds, xrays and labs> see below for updates >> ok Flu vaccine today... LABS 11/13:  Chems- ok w/ BS=110 A1c=7.2   ~  [name] 14, 2014:  73mo ROV & Whitney [name] reports a good interval w/o new complaints or concerns... We reviewed the following medical problems during today's office visit >>     HBP> on ASA81, Amlod10, LisinHct20-12.5; BP= 130/72 & she denies CP, palpit, SOB, edema, etc...    Chol> on Simva20; FLP 5/14 shows TChol 117, TG 111, HDL 34, LDL 61    DM> on Lantus30, Metform1000Bid, Glim4; labs today showed BS=106, A1c=7.3 (stable); continue same meds, better diet...    Overweight> wt is stable at 178# today; we reviewed diet, exercise, wt reduction strategies...    GI- GERD, Divertics, constip> on OTC Prilosec prn + LOC; last colon 2005 w/ divertics only    ?FLD> Labs showed sl incr SGOT=44, SGPT=50; advised low fat diet & wt reduction...    DJD, LBP, Neck pain> she notes arthritic complaints are "better" at present using OTC analgesics...    Hx Osteopenia> on Ca & MVI; last BMD 8/11 was normal...    Anxiety> not on meds, she manages very well... We reviewed prob list, meds, xrays and  labs> see below for updates >>  LABS 5/14:  FLP- at goals on Simva20 x HDL=34;  Chems- ok x BS=106, A1c=7.3, sl incr LFTs;  CBC- wnl;  TSH=1.22;  UA- clear...           Problem List:  GLAUCOMA (ICD-365.9) - eval by Whitney [name] & Rx w/ Travatan eye drops...  ~  2012:  She developed some exudates in right eye & Whitney [name] sent her to Whitney [name] 6/12 who did some type of surg on her retina in his office (we don't have notes to review) ==> she reports that Whitney [name] released her back to Whitney [name]'s care, doing well...  HEARING LOSS >> she was tested at Whitney [name]; now has right ear hearing aide...  HYPERTENSION (ICD-401.9) - on NORVASC 10mg /d  & LISINOPRIL/HCT 20-12.5 daily... ~  Last CXR 2002 showed heart at upper lim normal, clear lungs, NAD... ~  1/11:  went to ER w/ incr BP readings 170-180/ 90-100 & Norvasc incr  to 10mg /d... ~  2/11:  BP 140/88 & we decided to change the Lisinopril20 to LISINOPRIL/ HCT 20-12.5 daily;  All BP reading since then have been WNL... ~  11/11: BP=120/68 and similar at home she says... denies HA, fatigue, visual changes, CP, palipit, dizziness, syncope, dyspnea, edema, etc... ~  5/12:  BP= 122/62 & feeling well, remains asymptomatic... ~  11/12:  BP= 130/82 & she remains asymptomatic... ~  5/13:  BP= 124/70 & feeling well w/o CP, palpit, dizzy, SOB, edema, etc; she walks regularly w/ Whitney [name]. ~  CXR 5/13 showed normal heart size, clear lungs (sl incr markings) w/ low lung vols, NAD... ~  11/13:  BP= 124/76 & she denies CP, palpit, SOB, edema, etc...  CHEST PAIN (ICD-786.50) - on ASA 81mg /d... Atypical CP eval 11/06 by Whitney [name] neg- no ischemia or infarct, EF=70%... ~  5/12:  She denies CP, palpit, SOB, edema, etc;  We reviewed exercise recommendations...  HYPERCHOLESTEROLEMIA (ICD-272.0) - on SIMVASTATIN 20mg /d... she tries to follow a low carb, low fat diet... ~  baseline FLP 5/04 on diet alone TChol 270, TG 108, HDL 35, LDL 214... ~  FLP 9/08 on Simvastatin 20mg /d showed TChol 150, TG 92, HDL 36, LDL 95... ~  FLP 1/09 showed TChol 137, TG 89, HDL 32, LDL 87... rec- continue the Simva20. ~  FLP 5/10 showed TChol 124, TG 69, HDL 34, LDL 77 ~  FLP 5/11 showed TChol 141, TG 100, HDL 38, LDL 84 ~  FLP 11/11 showed TChol 126, TG 71, HDL 34, LDL 78 ~  FLP 5/12 on Simva20 showed TChol 125, TG 85, HDL 33, LDL 75 ~  FLP 11/12 on Simva20 showed TChol 127, TG 89, HDL 42, LDL 68 ~  FLP 5/13 on Simva20 showed TChol 113, TG 52, HDL 41, LDL 62  DIABETES MELLITUS, TYPE II, UNCONTROLLED (ICD-250.02) - prev eval for Endocrine by Whitney [name]... takes LANTUS 30u daily, plus METFORMIN 1000mg Bid & GLIMEPERIDE 4mg /d... she's been trying to follow diet, incr exercise & lose wt.... diet counselling from Whitney [name]. ~  labs 9/08 showed BS=172 and  HgA1c=7.5.Marland KitchenMarland Kitchen she never increased her Lantus, but states her BS at home improved to 100-120 range... ~  labs 1/09 showed BS= 132,  HgA1c= 6.8.Marland Kitchen. ~  labs 5/09 showed BS= 142, HgA1c= 7.0.Marland KitchenMarland Kitchen rec sl incr Lantus til FBS~ 100-110 (she didn't do this). ~  labs 11/09 showed BS= 109, A1c= 7.2 ~  labs 5/10 showed BS= 161, A1c= 7.8.Marland Kitchen. rec> incr  Lantus slowly til FBS 100-120 (she never did). ~  labs 11/10 showed BS= 155, A1c= 8.0.Marland Kitchen. rec> incr Lantus to 26 now & titrate up. ~  labs 2/11 showed A1c= 7.7.Marland Kitchen. rec> incr Lantus to 30. ~  labs 5/11 showed BS= 142, A1c= 7.3.Marland KitchenMarland Kitchen continue same, get wt down. ~  labs 11/11 showed BS= 121, A1c= 7.3.Marland KitchenMarland Kitchen same meds, diet, exerc, wt reduction. ~  labs 5/12 on Lant30+Met1000Bid+Glim4 showed BS=119, A1c=7.3 ~  Labs 11/12 on Lant30+Met1000Bid+Glim4 showed BS=108, A1c=7.2 ~  Labs 5/13 on Lant30+Met1000Bid+Glim4 showed BS=106, A1c=7.2 ~  Labs 11/13 on Lant30+Met1000Bid+Glim4 showed BS=110, A1c=7.2  OBESITY (ICD-278.00) - we've discussed diet + exercise & she's been to [name] for counselling... ~  11/11:  Weight =182#... 5' tall > BMI~ 35... ~  5/12:  Weight = 180# ~  11/12:  Weight = 181# ~  5/13:  Weight = 182# ~  11/13:  Weight = 179#  GERD (ICD-530.81) - uses Prilosec OTC 20mg /d as needed... no prev EGD.  DIVERTICULOSIS, COLON (ICD-562.10) - last colonoscopy 3/05 by DrSam w/ divertics only, f/u planned 77yrs... she has some mild constipation & uses "smooth move" and dulcolax as needed...  DEGENERATIVE JOINT DISEASE, KNEE (ICD-715.96) - eval 2006 by DrDraper... Rx OTC NSAID's (Ibuprofen) as needed...  ~  11/09 c/o left shoulder pain... rec to try Etodolac and refer to DrSypher- PT helped. ~  5/13:  She states actually doing quite well, [facility] & uses OTC Ibuprofen prn...  BAKER'S CYST (ICD-727.51) w/ prev eval from DrMurphy/ Ortho...  SPONDYLOSIS, LUMBAR (ICD-721.3) - eval by DrYates 1990 w/ conservative rx rec...  DISC DISEASE, CERVICAL (ICD-722.4) -  she notes that massage helps...  Hx of OSTEOPENIA (ICD-733.90) - she takes Calcium, MVI, Vit D... ~  BMD 2004 w/ TScore -1.7 in spine... ~  BMD repeat 5/09 was WNL w/ TScores +0.0 in Spine,  &  +0.8 in right FemNeck. ~  labs 5/11 showed Vit D level = 40... continue MVI, Calcium, etc... ~  BMD 8/11 showed TScores +0.3 in Spine, and +1.1 in left [facility] [name], continue supplement Rx. ~  Labs 5/13 showed Vit D level = 66  FACIAL PAIN, ATYPICAL (ICD-350.2) - eval 2001 by neuro/ DrSchmidt and resolved...  ANXIETY (ICD-300.00) - prev used Alprazolam Prn but off now... her husb passed away 6/10 & she has slowly recovered...  ONYCHOMYCOSIS (ICD-110.1) - eval by Podiatry & foot care clinic in the past... ~  2/11: she reports recent eval by DrTuckman for pain right lat malleolus- Rx Keflex.  NOTE:  I attempted to confirm med refills & compliance w/ her Pharm- she actually uses 3 pharmacies and can't tell me why? [facility], [facility] [place], & [facility] as well... ~  Immunizations:  she gets yearly seasonal Flu vaccines... had TETANUS shot 2004 & given TDAP 5/13... PNEUMOVAX in 65 (age 78) & 2011 (age 76).   Past Surgical History  Procedure Laterality Date  . Vaginal hysterectomy  2002    Dr. Donny Pique repair  . Right eye surgery  09/2010    retina in right eye    Outpatient Encounter Prescriptions as of 08/23/2012  Medication Sig Dispense Refill  . amLODipine (NORVASC) 10 MG tablet TAKE ONE TABLET BY MOUTH EVERY DAY  90 tablet  2  . aspirin 81 MG tablet Take 81 mg by mouth daily.        . Calcium Carbonate-Vitamin D (CALCIUM-VITAMIN D) 500-200 MG-UNIT per tablet Take 1 tablet by mouth 2 (two) times daily  with a meal.        . glimepiride (AMARYL) 4 MG tablet TAKE ONE TABLET BY MOUTH EVERY DAY BEFORE BREAKFAST  90 tablet  2  . LANTUS 100 UNIT/ML injection INJECT 30 UNITS SUBCUTANEOUSLY AT BEDTIME  10 mL  6  . lisinopril-hydrochlorothiazide (PRINZIDE,ZESTORETIC) 20-12.5 MG per tablet  TAKE ONE TABLET BY MOUTH EVERY DAY  90 tablet  2  . metFORMIN (GLUCOPHAGE) 1000 MG tablet TAKE ONE TABLET BY MOUTH TWICE DAILY WITH A MEAL  180 tablet  2  . Multiple Vitamin (MULTIVITAMIN PO) Take 1 tablet by mouth daily.        . simvastatin (ZOCOR) 20 MG tablet TAKE ONE TABLET BY MOUTH AT BEDTIME  90 tablet  2  . travoprost, benzalkonium, (TRAVATAN) 0.004 % ophthalmic solution Place 1 drop into both eyes at bedtime.         No facility-administered encounter medications on file as of 08/23/2012.    No Known Allergies   Current Medications, Allergies, Past Medical History, Past Surgical History, Family History, and Social History were reviewed in [employer] record.    Review of Systems        See HPI - all other systems neg except as noted... The patient denies anorexia, fever, weight loss, weight gain, vision loss, decreased hearing, hoarseness, chest pain, syncope, dyspnea on exertion, peripheral edema, prolonged cough, headaches, hemoptysis, abdominal pain, melena, hematochezia, severe indigestion/heartburn, hematuria, incontinence, muscle weakness, suspicious skin lesions, transient blindness, difficulty walking, depression, unusual weight change, abnormal bleeding, enlarged lymph nodes, and angioedema.     Objective:   Physical Exam     WD, Overwt, 77 y/o BF in NAD... Vital Signs:  Reviewed... GENERAL:  Alert & oriented; pleasant & cooperative... HEENT:  [place]/AT, EOM-wnl, PERRLA, EACs-clear, TMs-wnl, NOSE-clear, THROAT-clear & wnl. NECK:  Supple w/ fairROM; no JVD; normal carotid impulses w/o bruits; no thyromegaly or nodules palpated; no lymphadenopathy. CHEST:  Clear to P & A; without wheezes/ rales/ or rhonchi. HEART:  Regular Rhythm; without murmurs/ rubs/ or gallops. ABDOMEN:  Soft & nontender, w/ +panniculus, normal bowel sounds; no organomegaly or masses detected. EXT: without deformities, mild arthritic changes; no varicose veins/ venous insuffic/ or  edema. NEURO:  CN's intact;  no focal neuro deficits... DERM:  No lesions noted; no rash etc...  RADIOLOGY DATA:  Reviewed in the EPIC EMR & discussed w/ the patient...  LABORATORY DATA:  Reviewed in the EPIC EMR & discussed w/ the patient...   Assessment & Plan:    GLAUCOMA & Exudates noted>  Rx w/ eye drops per Whitney [name]; She was referred to Whitney [name] for retina eval & he did some kind of surg on her right eye 6/12 in his office (we don't have records); she tells me that he has released her back to Whitney [name]'s care, doing satis...  HBP>  Controlled on CCB, ACE, Diuretic; continue same...  Hx CP>  Denies angina, [facility]...  CHOL>  Looks good on Simva20...  DM>  Control fair on 3 meds, needs better diet & get wt down!  Obesity>  As above, we discussed cutting back on caloric intake & increasing output...  GI>  GERD, Constip, Divertics>  She notes some constip & we discussed fiber etc...  ORTHO>  She is off the etodolac & uses Tylenol, Aleve, etc...   Patient's Medications  New Prescriptions   No medications on file  Previous Medications   AMLODIPINE (NORVASC) 10 MG TABLET    TAKE  ONE TABLET BY MOUTH EVERY DAY   ASPIRIN 81 MG TABLET    Take 81 mg by mouth daily.     CALCIUM CARBONATE-VITAMIN D (CALCIUM-VITAMIN D) 500-200 MG-UNIT PER TABLET    Take 1 tablet by mouth 2 (two) times daily with a meal.     GLIMEPIRIDE (AMARYL) 4 MG TABLET    TAKE ONE TABLET BY MOUTH EVERY DAY BEFORE BREAKFAST   LANTUS 100 UNIT/ML INJECTION    INJECT 30 UNITS SUBCUTANEOUSLY AT BEDTIME   LISINOPRIL-HYDROCHLOROTHIAZIDE (PRINZIDE,ZESTORETIC) 20-12.5 MG PER TABLET    TAKE ONE TABLET BY MOUTH EVERY DAY   METFORMIN (GLUCOPHAGE) 1000 MG TABLET    TAKE ONE TABLET BY MOUTH TWICE DAILY WITH A MEAL   MULTIPLE VITAMIN (MULTIVITAMIN PO)    Take 1 tablet by mouth daily.     SIMVASTATIN (ZOCOR) 20 MG TABLET    TAKE ONE TABLET BY MOUTH AT BEDTIME   TRAVOPROST, BENZALKONIUM, (TRAVATAN) 0.004 %  OPHTHALMIC SOLUTION    Place 1 drop into both eyes at bedtime.    Modified Medications   No medications on file  Discontinued Medications   No medications on file

## 2012-12-14 ENCOUNTER — Other Ambulatory Visit: Payer: Self-pay

## 2012-12-14 DIAGNOSIS — Z1231 Encounter for screening mammogram for malignant neoplasm of breast: Secondary | ICD-10-CM

## 2012-12-19 ENCOUNTER — Telehealth: Payer: Self-pay | Admitting: Pulmonary Disease

## 2012-12-19 MED ORDER — METHYLPREDNISOLONE 4 MG PO KIT: PACK | ORAL | Status: DC

## 2012-12-19 NOTE — Telephone Encounter (Signed)
I spoke with pt. She reports she was stung by a bee Friday night on left upper arm. The area did hurt at first and was red. She reports she used triple ABX ointment. That has helped with the itching in that area. Pt reports it is still red, warm in that area, and still itches. She is using the ointment about once a day. She did not take benadryl. She stated being a diabetic she did not know what to do and since the area is still red, warm and itching she is requesting rec from SN. Please advise thanks Last OV 08/23/12 Pending 03/12/13 No Known Allergies

## 2012-12-19 NOTE — Telephone Encounter (Signed)
Per SN--  Cool compresses Benadryl 25 mg  Every 4 hours as needed Call in medrol dosepak #1  Take as directed with no refills. thanks

## 2012-12-19 NOTE — Telephone Encounter (Signed)
Called spoke with patient, advised of SN's recs as stated below.  Recommendations repeated to patient x2 so that she could write them down.  Rx sent to verified pharmacy.  Pt aware to call back if symptoms do not improve or worsen.  Nothing further needed at this time; will sign off.

## 2012-12-21 ENCOUNTER — Telehealth: Payer: Self-pay | Admitting: Pulmonary Disease

## 2012-12-21 NOTE — Telephone Encounter (Signed)
ATC patient no answer Phone rang several times with no option to leave [facility]

## 2012-12-21 NOTE — Telephone Encounter (Signed)
Per phone msg from 12/19/12, medfrol dosepak was called in for pt d/t bee sting.  Pt states she has 3 days left.  She has noticed her blood sugars and SBP have both increased.  Reports her blood sugar yesterday at 2:45 pm was 239, at 6pm it was 291, and at 9pm it was 265.  This morning it was 157 fasting.  At 10 am this morning it was 138.  Pt reports her normal blood sugars are usually 100-110.  Also, her SBP has been ranging from 138-155.  Reports normal for her is 130.  Pt denies any new symptoms.  Did state she had a HA yesterday am which has now resolved.  She is aware medrol can increase sugars and just wants to ensure if she needs to do anything additional with above readings.  Pt is currently taking DM meds as directed (glimeperide, Lantus, metformin).  Dr. Kriste Basque, pls advise.  Thank you.  No Known Allergies

## 2012-12-21 NOTE — Telephone Encounter (Signed)
Pt returned call. Whitney [name] °

## 2012-12-21 NOTE — Telephone Encounter (Signed)
Per SN---  Stop the rest of the dosepak Increase water intake Everything should return to previous state.

## 2012-12-21 NOTE — Telephone Encounter (Signed)
I called and made pt aware. She voiced her understanding and needed nothing further

## 2013-01-01 ENCOUNTER — Other Ambulatory Visit: Payer: Self-pay | Admitting: Pulmonary Disease

## 2013-01-15 ENCOUNTER — Other Ambulatory Visit: Payer: Self-pay | Admitting: Pulmonary Disease

## 2013-01-15 ENCOUNTER — Ambulatory Visit
Admission: RE | Admit: 2013-01-15 | Discharge: 2013-01-15 | Disposition: A | Discharge status: Home / Self Care | Payer: Medicare Other | Source: Ambulatory Visit

## 2013-01-15 DIAGNOSIS — Z1231 Encounter for screening mammogram for malignant neoplasm of breast: Secondary | ICD-10-CM

## 2013-01-17 ENCOUNTER — Other Ambulatory Visit: Payer: Self-pay | Admitting: Pulmonary Disease

## 2013-01-17 MED ORDER — INSULIN GLARGINE 100 UNIT/ML ~~LOC~~ SOLN: SUBCUTANEOUS | Status: DC

## 2013-03-12 ENCOUNTER — Encounter: Payer: Self-pay | Admitting: Pulmonary Disease

## 2013-03-12 ENCOUNTER — Other Ambulatory Visit (INDEPENDENT_AMBULATORY_CARE_PROVIDER_SITE_OTHER): Payer: Medicare Other

## 2013-03-12 ENCOUNTER — Ambulatory Visit (INDEPENDENT_AMBULATORY_CARE_PROVIDER_SITE_OTHER): Payer: Medicare Other | Admitting: Pulmonary Disease

## 2013-03-12 VITALS — BP 126/70 | HR 70 | Temp 97.7°F | Ht 61.0 in | Wt 178.0 lb

## 2013-03-12 DIAGNOSIS — I10 Essential (primary) hypertension: Secondary | ICD-10-CM

## 2013-03-12 DIAGNOSIS — E663 Overweight: Secondary | ICD-10-CM

## 2013-03-12 DIAGNOSIS — J3489 Other specified disorders of nose and nasal sinuses: Secondary | ICD-10-CM

## 2013-03-12 DIAGNOSIS — E119 Type 2 diabetes mellitus without complications: Secondary | ICD-10-CM

## 2013-03-12 DIAGNOSIS — M1710 Unilateral primary osteoarthritis, unspecified knee: Secondary | ICD-10-CM

## 2013-03-12 DIAGNOSIS — K219 Gastro-esophageal reflux disease without esophagitis: Secondary | ICD-10-CM

## 2013-03-12 DIAGNOSIS — E7800 Pure hypercholesterolemia, unspecified: Secondary | ICD-10-CM

## 2013-03-12 DIAGNOSIS — Z1388 Encounter for screening for disorder due to exposure to contaminants: Secondary | ICD-10-CM

## 2013-03-12 DIAGNOSIS — K5730 Diverticulosis of large intestine without perforation or abscess without bleeding: Secondary | ICD-10-CM

## 2013-03-12 DIAGNOSIS — F411 Generalized anxiety disorder: Secondary | ICD-10-CM

## 2013-03-12 DIAGNOSIS — Z23 Encounter for immunization: Secondary | ICD-10-CM

## 2013-03-12 DIAGNOSIS — IMO0002 Reserved for concepts with insufficient information to code with codable children: Secondary | ICD-10-CM

## 2013-03-12 LAB — BASIC METABOLIC PANEL
BUN: 11 mg/dL (ref 6–23)
CO2: 30 mEq/L (ref 19–32)
Calcium: 9.9 mg/dL (ref 8.4–10.5)
Chloride: 104 mEq/L (ref 96–112)
Creatinine, Ser: 0.8 mg/dL (ref 0.4–1.2)
GFR: 77.45 mL/min (ref >60.00)
Glucose, Bld: 96 mg/dL (ref 70–99)
Potassium: 4.1 mEq/L (ref 3.5–5.1)
Sodium: 141 mEq/L (ref 135–145)

## 2013-03-12 LAB — HEMOGLOBIN A1C: Hgb A1c MFr Bld: 7.6 % — ABNORMAL HIGH (ref 4.6–6.5)

## 2013-03-12 MED ORDER — SIMVASTATIN 20 MG PO TABS: ORAL_TABLET | ORAL | Status: DC

## 2013-03-12 MED ORDER — METFORMIN HCL 1000 MG PO TABS: ORAL_TABLET | ORAL | Status: DC

## 2013-03-12 MED ORDER — LISINOPRIL-HYDROCHLOROTHIAZIDE 20-12.5 MG PO TABS: ORAL_TABLET | ORAL | Status: DC

## 2013-03-12 MED ORDER — AMLODIPINE BESYLATE 10 MG PO TABS: ORAL_TABLET | ORAL | Status: DC

## 2013-03-12 MED ORDER — INSULIN GLARGINE 100 UNIT/ML ~~LOC~~ SOLN: SUBCUTANEOUS | Status: DC

## 2013-03-12 MED ORDER — GLIMEPIRIDE 4 MG PO TABS: ORAL_TABLET | ORAL | Status: DC

## 2013-03-12 NOTE — Progress Notes (Signed)
Subjective:    Patient ID: Whitney [name], female    DOB: Jun 23, 1934, 78 y.o.   MRN: [id number]  HPI 78 y/o BF here for a follow up visit... she has mult med problems as noted below...    ~  Aug 23, 2011:  271moROV & Ambry has had a stable interval w/o new complaints or concerns;  She saw DrSanders for Retina f/u & she tells me he released her back to DrGroat's care (the retina surg 6/12 was successful);  She is doing well on her current meds- not checking BP at home but it is fine here, and home accuchecks are "ok" per pt...  We reviewed prob list, meds, xrays & labs> see prob list below>> CXR 5/13 showed normal heart size, clear lungs (sl incr markings) w/ low lung vols, NAD... LABS 5/13:  FLP- parameters at goals on Simva20;  Chems- ok x BS=106 A1c=7.2 on Lantus & 265ms;  CBC- wnl;  TSH=1.09;  VitD=66  ~  February 23, 2012:  71m80moV & Whitney [name] now has a hearing aide for her left ear;  We reviewed the following medical problems today:    HBP> on ASA81, Amlod10, LisinHct20-12.5; BP= 124/76 & she denies CP, palpit, SOB, edema, etc...    Chol> on Simva20; last FLP 5/13 showed TChol 113, TG 52, HDL 41, LDL 62    DM> on Lantus30, Metform1000Bid, Glim4; labs today showed BS=110, A1c=7.2; continue same meds, better diet...    Overweight> wt is down 2# to 179# today; we reviewed diet, exercise, wt reduction strategies...    DJD, LBP, Neck pain> she notes arthritic complaints are "better" at present using OTC analgesics...    Hx Osteopenia> on Ca & MVI; last BMD 8/11 was normal...    Anxiety> not on meds, she manages very well... We reviewed prob list, meds, xrays and labs> see below for updates >> ok Flu vaccine today... LABS 11/13:  Chems- ok w/ BS=110 A1c=7.2   ~  Aug 23, 2012:  71mo89mo & [name] reports a good interval w/o new complaints or concerns... We reviewed the following medical problems during today's office visit >>     HBP> on ASA81, Amlod10, LisinHct20-12.5; BP= 130/72 & she denies CP,  palpit, SOB, edema, etc...    Chol> on Simva20; FLP 5/14 shows TChol 117, TG 111, HDL 34, LDL 61    DM> on Lantus30, Metform1000Bid, Glim4; labs today showed BS=106, A1c=7.3 (stable); continue same meds, better diet...    Overweight> wt is stable at 178# today; we reviewed diet, exercise, wt reduction strategies...    GI- GERD, Divertics, constip> on OTC Prilosec prn + LOC; last colon 2005 w/ divertics only    ?FLD> Labs showed sl incr SGOT=44, SGPT=50; advised low fat diet & wt reduction...    DJD, LBP, Neck pain> she notes arthritic complaints are "better" at present using OTC analgesics...    Hx Osteopenia> on Ca & MVI; last BMD 8/11 was normal...    Anxiety> not on meds, she manages very well... We reviewed prob list, meds, xrays and labs> see below for updates >>  LABS 5/14:  FLP- at goals on Simva20 x HDL=34;  Chems- ok x BS=106, A1c=7.3, sl incr LFTs;  CBC- wnl;  TSH=1.22;  UA- clear...   ~  March 12, 2013:  6-70mo 49mo& HattiAivaoncerned about some sinus drainage; she had her house paint tested for lead & it came back positive, no she is concerned that the drainage is  due to lead poisoning & she wants a blood lead level done; I tried to reassure her- CBCs have been normal, no basophilic stippling, etc but we agreed to check lead level to give her some peace of mind... Rec that she try OTC antihist + mucinex for the drainage.    HBP> on ASA81, Amlod10, LisinHct20-12.5; BP= 126/70 & she denies CP, palpit, SOB, edema, etc...    Chol> on Simva20; FLP 5/14 shows TChol 117, TG 111, HDL 34, LDL 61    DM> on Lantus30, Metform1000Bid, Glim4; labs today showed BS=96, A1c=7.6 (sl worse); continue same meds, better diet...    Overweight> wt is stable at 178# today; we reviewed diet, exercise, wt reduction strategies...    GI- GERD, Divertics, constip> on OTC Prilosec prn + LOC; last colon 2005 w/ divertics only    ?FLD> Labs 5/14 showed sl incr SGOT=44, SGPT=50; advised low fat diet & wt  reduction...    DJD, LBP, Neck pain> she notes arthritic complaints are "better" at present using OTC analgesics...    Hx Osteopenia> on Ca & MVI; last BMD 8/11 was normal...    Anxiety> not on meds, she manages very well... We reviewed prob list, meds, xrays and labs> see below for updates >> OK 2014 flu vaccine today & refill of meds... LABS 12/14:  Chems- ok w/ BS=96, A1c=7.6 on her 3 meds...  NOTE> Blood lead level returned <2 and normal is <10... She is reassured.          Problem List:  GLAUCOMA (ICD-365.9) - eval by DrGroat & Rx w/ Travatan eye drops...  ~  2012:  She developed some exudates in right eye & DrGroat sent her to DrSanders 6/12 who did some type of surg on her retina in his office (we don't have notes to review) ==> she reports that DrSanders released her back to DrGroat's care, doing well...  HEARING LOSS >> she was tested at [facility]; now has right ear hearing aide...  HYPERTENSION (ICD-401.9) - on NORVASC '10mg'$ /d  & LISINOPRIL/HCT 20-12.5 daily... ~  Last CXR 2002 showed heart at upper lim normal, clear lungs, NAD... ~  1/11:  went to ER w/ incr BP readings 170-180/ 90-100 & Norvasc incr to '10mg'$ /d... ~  2/11:  BP 140/88 & we decided to change the Lisinopril20 to LISINOPRIL/ HCT 20-12.5 daily;  All BP reading since then have been WNL... ~  11/11: BP=120/68 and similar at home she says... denies HA, fatigue, visual changes, CP, palipit, dizziness, syncope, dyspnea, edema, etc... ~  5/12:  BP= 122/62 & feeling well, remains asymptomatic... ~  11/12:  BP= 130/82 & she remains asymptomatic... ~  5/13:  BP= 124/70 & feeling well w/o CP, palpit, dizzy, SOB, edema, etc; she walks regularly w/ Sabino Donovan. ~  CXR 5/13 showed normal heart size, clear lungs (sl incr markings) w/ low lung vols, NAD... ~  11/13:  BP= 124/76 & she denies CP, palpit, SOB, edema, etc... ~  5/14: on ASA81, Amlod10, LisinHct20-12.5; BP= 130/72 & she denies CP, palpit, SOB, edema, etc ~  12/14: on  ASA81, Amlod10, LisinHct20-12.5; BP= 126/70 & she remains asymptomatic...  CHEST PAIN (ICD-786.50) - on ASA '81mg'$ /d... Atypical CP eval 11/06 by Sol Passer- EKG w/ NSSTTWA, & NuclearStressTest neg- no ischemia or infarct, EF=70%... ~  5/12:  She denies CP, palpit, SOB, edema, etc;  We reviewed exercise recommendations...  HYPERCHOLESTEROLEMIA (ICD-272.0) - on SIMVASTATIN '20mg'$ /d... she tries to follow a low carb, low fat diet... ~  baseline  FLP 5/04 on diet alone TChol 270, TG 108, HDL 35, LDL 214... ~  [place] 9/08 on Simvastatin '20mg'$ /d showed TChol 150, TG 92, HDL 36, LDL 95... ~  [place] 1/09 showed TChol 137, TG 89, HDL 32, LDL 87... rec- continue the Simva20. ~  FLP 5/10 showed TChol 124, TG 69, HDL 34, LDL 77 ~  FLP 5/11 showed TChol 141, TG 100, HDL 38, LDL 84 ~  FLP 11/11 showed TChol 126, TG 71, HDL 34, LDL 78 ~  FLP 5/12 on Simva20 showed TChol 125, TG 85, HDL 33, LDL 75 ~  FLP 11/12 on Simva20 showed TChol 127, TG 89, HDL 42, LDL 68 ~  FLP 5/13 on Simva20 showed TChol 113, TG 52, HDL 41, LDL 62 ~  FLP 5/14 on Simva20 showed TChol 117, TG 111, HDL 34, LDL 61   DIABETES MELLITUS, TYPE II, UNCONTROLLED (ICD-250.02) - prev eval for Endocrine by DrKrege... takes LANTUS 30u daily, plus METFORMIN '1000mg'$ Bid & GLIMEPERIDE '4mg'$ /d... she's been trying to follow diet, incr exercise & lose wt.... diet counselling from [facility]- Maggie May. ~  labs 9/08 showed BS=172 and HgA1c=7.5.Marland KitchenMarland Kitchen she never increased her Lantus, but states her BS at home improved to 100-120 range... ~  labs 1/09 showed BS= 132,  HgA1c= 6.8.Marland Kitchen. ~  labs 5/09 showed BS= 142, HgA1c= 7.0.Marland KitchenMarland Kitchen rec sl incr Lantus til FBS~ 100-110 (she didn't do this). ~  labs 11/09 showed BS= 109, A1c= 7.2 ~  labs 5/10 showed BS= 161, A1c= 7.8.Marland Kitchen. rec> incr Lantus slowly til FBS 100-120 (she never did). ~  labs 11/10 showed BS= 155, A1c= 8.0.Marland Kitchen. rec> incr Lantus to 26 now & titrate up. ~  labs 2/11 showed A1c= 7.7.Marland Kitchen. rec> incr Lantus to 30. ~  labs 5/11 showed  BS= 142, A1c= 7.3.Marland KitchenMarland Kitchen continue same, get wt down. ~  labs 11/11 showed BS= 121, A1c= 7.3.Marland KitchenMarland Kitchen same meds, diet, exerc, wt reduction. ~  labs 5/12 on Lant30+Met1000Bid+Glim4 showed BS=119, A1c=7.3 ~  Labs 11/12 on Lant30+Met1000Bid+Glim4 showed BS=108, A1c=7.2 ~  Labs 5/13 on Lant30+Met1000Bid+Glim4 showed BS=106, A1c=7.2 ~  Labs 11/13 on Lant30+Met1000Bid+Glim4 showed BS=110, A1c=7.2 ~  Labs 5/14 on Lant30+Met1000Bid+Glim4 showed BS=106, A1c=7.3 ~  Labs 12/14 on Lant30+Met1000Bid+Glim4 showed BS=96, A1c=7.6  OBESITY (ICD-278.00) - we've discussed diet + exercise & she's been to [facility] for counselling... ~  11/11:  Weight =182#... 5' tall > BMI~ 35... ~  5/12:  Weight = 180# ~  11/12:  Weight = 181# ~  5/13:  Weight = 182# ~  11/13:  Weight = 179# ~  5/14:  Weight = 178# ~  12/14  Weight = 178#  GERD (ICD-530.81) - uses Prilosec OTC '20mg'$ /d as needed... no prev EGD.  DIVERTICULOSIS, COLON (ICD-562.10) - last colonoscopy 3/05 by DrSam w/ divertics only, f/u planned 63yr... she has some mild constipation & uses "smooth move" and dulcolax as needed...  DEGENERATIVE JOINT DISEASE, KNEE (ICD-715.96) - eval 2006 by DrDraper... Rx OTC NSAID's (Ibuprofen) as needed...  ~  11/09 c/o left shoulder pain... rec to try Etodolac and refer to DrSypher- PT helped. ~  5/13:  She states actually doing quite well, [facility] at [facility] & uses OTC Ibuprofen prn...  BAKER'S CYST (ICD-727.51) w/ prev eval from DrMurphy/ Ortho...  SPONDYLOSIS, LUMBAR (ICD-721.3) - eval by DrYates 1990 w/ conservative rx rec...  DISC DISEASE, CERVICAL (ICD-722.4) - she notes that massage helps...  Hx of OSTEOPENIA (ICD-733.90) - she takes Calcium, MVI, Vit D... ~  BMD 2004 w/ TScore -  1.7 in spine... ~  BMD repeat 5/09 was WNL w/ TScores +0.0 in Spine,  &  +0.8 in right FemNeck. ~  labs 5/11 showed Vit D level = 40... continue MVI, Calcium, etc... ~  BMD 8/11 showed TScores +0.3 in Spine, and +1.1 in left [facility], continue  supplement Rx. ~  Labs 5/13 showed Vit D level = 66  FACIAL PAIN, ATYPICAL (ICD-350.2) - eval 2001 by neuro/ DrSchmidt and resolved...  ANXIETY (ICD-300.00) - prev used Alprazolam Prn but off now... her husb passed away 6/10 & she has slowly recovered...  ONYCHOMYCOSIS (ICD-110.1) - eval by Podiatry & foot care clinic in the past... ~  2/11: she reports recent eval by DrTuckman for pain right lat malleolus- Rx Keflex.  NOTE:  I attempted to confirm med refills & compliance w/ her Pharm- she actually uses 3 pharmacies and can't tell me why? Anthony Sar, [place] as well... ~  Immunizations:  she gets yearly seasonal Flu vaccines... had TETANUS shot 2004 & given TDAP 5/13... PNEUMOVAX in 4 (age 50) & 2011 (age 64).   Past Surgical History  Procedure Laterality Date  . Vaginal hysterectomy  2002    Dr. Kem Kays repair  . Right eye surgery  09/2010    retina in right eye    Outpatient Encounter Prescriptions as of 03/12/2013  Medication Sig  . amLODipine (NORVASC) 10 MG tablet TAKE ONE TABLET BY MOUTH EVERY DAY  . aspirin 81 MG tablet Take 81 mg by mouth daily.    . Calcium Carbonate-Vitamin D (CALCIUM-VITAMIN D) 500-200 MG-UNIT per tablet Take 1 tablet by mouth 2 (two) times daily with a meal.    . glimepiride (AMARYL) 4 MG tablet TAKE ONE TABLET BY MOUTH EVERY DAY BEFORE BREAKFAST  . insulin glargine (LANTUS) 100 UNIT/ML injection INJECT 30 UNITS SUBCUTANEOUSLY AT BEDTIME  . lisinopril-hydrochlorothiazide (PRINZIDE,ZESTORETIC) 20-12.5 MG per tablet TAKE ONE TABLET BY MOUTH EVERY DAY  . metFORMIN (GLUCOPHAGE) 1000 MG tablet TAKE ONE TABLET BY MOUTH TWICE DAILY WITH MEALS  . Multiple Vitamin (MULTIVITAMIN PO) Take 1 tablet by mouth daily.    . simvastatin (ZOCOR) 20 MG tablet TAKE ONE TABLET BY MOUTH AT BEDTIME  . travoprost, benzalkonium, (TRAVATAN) 0.004 % ophthalmic solution Place 1 drop into both eyes at bedtime.    . [DISCONTINUED]  methylPREDNISolone (MEDROL DOSEPAK) 4 MG tablet follow package directions    No Known Allergies   Current Medications, Allergies, Past Medical History, Past Surgical History, Family History, and Social History were reviewed in [employer] record.    Review of Systems        See HPI - all other systems neg except as noted... The patient denies anorexia, fever, weight loss, weight gain, vision loss, decreased hearing, hoarseness, chest pain, syncope, dyspnea on exertion, peripheral edema, prolonged cough, headaches, hemoptysis, abdominal pain, melena, hematochezia, severe indigestion/heartburn, hematuria, incontinence, muscle weakness, suspicious skin lesions, transient blindness, difficulty walking, depression, unusual weight change, abnormal bleeding, enlarged lymph nodes, and angioedema.     Objective:   Physical Exam     WD, Overwt, 78 y/o BF in NAD... Vital Signs:  Reviewed... GENERAL:  Alert & oriented; pleasant & cooperative... HEENT:  [place]/AT, EOM-wnl, PERRLA, EACs-clear, TMs-wnl, NOSE-clear, THROAT-clear & wnl. NECK:  Supple w/ fairROM; no JVD; normal carotid impulses w/o bruits; no thyromegaly or nodules palpated; no lymphadenopathy. CHEST:  Clear to P & A; without wheezes/ rales/ or rhonchi. HEART:  Regular Rhythm; without murmurs/ rubs/ or gallops. ABDOMEN:  Soft & nontender, w/ +panniculus, normal bowel sounds; no organomegaly or masses detected. EXT: without deformities, mild arthritic changes; no varicose veins/ venous insuffic/ or edema. NEURO:  CN's intact;  no focal neuro deficits... DERM:  No lesions noted; no rash etc...  RADIOLOGY DATA:  Reviewed in the EPIC EMR & discussed w/ the patient...  LABORATORY DATA:  Reviewed in the EPIC EMR & discussed w/ the patient...   Assessment & Plan:    ANXIETY over lead in her paint & sinus drainage>> I reassured her that there is no connection; we checked the blood lead level & it was  WNL...   GLAUCOMA & Exudates noted>  Rx w/ eye drops per DrGroat; She was referred to DrSanders for retina eval & he did some kind of surg on her right eye 6/12 in his office (we don't have records); she tells me that he has released her back to DrGroat's care, doing satis...  HBP>  Controlled on CCB, ACE, Diuretic; continue same...  Hx CP>  Denies angina, [facility]...  CHOL>  Looks good on Simva20...  DM>  Control fair on 3 meds, needs better diet & get wt down!  Obesity>  As above, we discussed cutting back on caloric intake & increasing output...  GI>  GERD, Constip, Divertics>  She notes some constip & we discussed fiber etc...  ORTHO>  She is off the etodolac & uses Tylenol, Aleve, etc...   Patient's Medications  New Prescriptions   No medications on file  Previous Medications   ASPIRIN 81 MG TABLET    Take 81 mg by mouth daily.     CALCIUM CARBONATE-VITAMIN D (CALCIUM-VITAMIN D) 500-200 MG-UNIT PER TABLET    Take 1 tablet by mouth 2 (two) times daily with a meal.     MULTIPLE VITAMIN (MULTIVITAMIN PO)    Take 1 tablet by mouth daily.     TRAVOPROST, BENZALKONIUM, (TRAVATAN) 0.004 % OPHTHALMIC SOLUTION    Place 1 drop into both eyes at bedtime.    Modified Medications   Modified Medication Previous Medication   AMLODIPINE (NORVASC) 10 MG TABLET amLODipine (NORVASC) 10 MG tablet      TAKE ONE TABLET BY MOUTH EVERY DAY    TAKE ONE TABLET BY MOUTH EVERY DAY   GLIMEPIRIDE (AMARYL) 4 MG TABLET glimepiride (AMARYL) 4 MG tablet      TAKE ONE TABLET BY MOUTH EVERY DAY BEFORE BREAKFAST    TAKE ONE TABLET BY MOUTH EVERY DAY BEFORE BREAKFAST   INSULIN GLARGINE (LANTUS) 100 UNIT/ML INJECTION insulin glargine (LANTUS) 100 UNIT/ML injection      INJECT 30 UNITS SUBCUTANEOUSLY AT BEDTIME    INJECT 30 UNITS SUBCUTANEOUSLY AT BEDTIME   LISINOPRIL-HYDROCHLOROTHIAZIDE (PRINZIDE,ZESTORETIC) 20-12.5 MG PER TABLET lisinopril-hydrochlorothiazide (PRINZIDE,ZESTORETIC) 20-12.5 MG  per tablet      TAKE ONE TABLET BY MOUTH EVERY DAY    TAKE ONE TABLET BY MOUTH EVERY DAY   METFORMIN (GLUCOPHAGE) 1000 MG TABLET metFORMIN (GLUCOPHAGE) 1000 MG tablet      TAKE ONE TABLET BY MOUTH TWICE DAILY WITH MEALS    TAKE ONE TABLET BY MOUTH TWICE DAILY WITH MEALS   SIMVASTATIN (ZOCOR) 20 MG TABLET simvastatin (ZOCOR) 20 MG tablet      TAKE ONE TABLET BY MOUTH AT BEDTIME    TAKE ONE TABLET BY MOUTH AT BEDTIME  Discontinued Medications   METHYLPREDNISOLONE (MEDROL DOSEPAK) 4 MG TABLET    follow package directions

## 2013-03-12 NOTE — Patient Instructions (Signed)
Today we updated your med list in our EPIC system...    Continue your current medications the same...    We refilled your meds per request...  For your Drainage>>    Try a daily antihistamine like Claritin, Zyrtek, Allegra as needed...    And use the OTC mucolytic agent- MUCINEX 600mg - take 1-2 tabs twice daily w/ lots of fluids...   If necessary you can use a nasal saline mist to help as well...  Today we gave you the 2014 Flu vaccine...  I applaud your diet & exercise plans for the new year!  Today we did your follow up DM labs and we will request a serum lead level as well...    We will contact you w/ the results when available...   Call for any questions...  Let's plan a follow up visit in 73mo, sooner if needed for problems.Marland KitchenMarland Kitchen

## 2013-03-14 LAB — LEAD, BLOOD: Lead-Whole Blood: <2 ug/dL (ref <10)

## 2013-04-25 ENCOUNTER — Encounter: Payer: Self-pay | Admitting: Gastroenterology

## 2013-05-16 ENCOUNTER — Encounter: Payer: Self-pay | Admitting: Gastroenterology

## 2013-06-01 ENCOUNTER — Telehealth: Payer: Self-pay | Admitting: Pulmonary Disease

## 2013-06-01 DIAGNOSIS — K5730 Diverticulosis of large intestine without perforation or abscess without bleeding: Secondary | ICD-10-CM

## 2013-06-01 NOTE — Telephone Encounter (Signed)
Per SN---  Ok to send in the referral for the colon to be done.  Pt will need to set up with primary care since SN will no longer be able to see Primary care after April 1st.  SN will only be doing pulmonary as of July 11, 2013.  thanks

## 2013-06-01 NOTE — Telephone Encounter (Signed)
Referral placed lmtcb x1 for pt

## 2013-06-01 NOTE — Telephone Encounter (Signed)
Called spoke with pt. She has nurse appt 06/11/13 and colonoscpy suppose to be done 06/19/13. Pt insurance now is requesting referral from PCP. Please advise SN thanks

## 2013-06-04 NOTE — Telephone Encounter (Signed)
Pt advised. She will decide on what PCP she wants and will let us know. [place], CMA

## 2013-06-08 ENCOUNTER — Other Ambulatory Visit: Payer: Self-pay | Admitting: Pulmonary Disease

## 2013-06-08 MED ORDER — LISINOPRIL-HYDROCHLOROTHIAZIDE 20-12.5 MG PO TABS: ORAL_TABLET | ORAL | Status: DC

## 2013-06-08 MED ORDER — PRODIGY TWIST TOP LANCETS 28G MISC: Status: DC

## 2013-06-08 MED ORDER — SIMVASTATIN 20 MG PO TABS: ORAL_TABLET | ORAL | Status: DC

## 2013-06-08 MED ORDER — PRODIGY AUTOCODE BLOOD GLUCOSE DEVI: Status: DC

## 2013-06-08 MED ORDER — METFORMIN HCL 1000 MG PO TABS: ORAL_TABLET | ORAL | Status: DC

## 2013-06-08 MED ORDER — GLUCOSE BLOOD VI STRP: ORAL_STRIP | Status: DC

## 2013-06-08 MED ORDER — GLIMEPIRIDE 4 MG PO TABS: ORAL_TABLET | ORAL | Status: DC

## 2013-06-08 MED ORDER — AMLODIPINE BESYLATE 10 MG PO TABS: ORAL_TABLET | ORAL | Status: DC

## 2013-06-11 ENCOUNTER — Ambulatory Visit (AMBULATORY_SURGERY_CENTER): Payer: Self-pay | Admitting: *Deleted

## 2013-06-11 VITALS — Ht 61.0 in | Wt 180.0 lb

## 2013-06-11 DIAGNOSIS — Z1211 Encounter for screening for malignant neoplasm of colon: Secondary | ICD-10-CM

## 2013-06-11 MED ORDER — MOVIPREP 100 G PO SOLR: ORAL | Status: DC

## 2013-06-11 NOTE — Progress Notes (Signed)
Patient denies any allergies to eggs or soy. Patient denies any problems with anesthesia.  

## 2013-06-19 ENCOUNTER — Encounter: Payer: Self-pay | Admitting: Gastroenterology

## 2013-06-19 ENCOUNTER — Ambulatory Visit (AMBULATORY_SURGERY_CENTER): Payer: Commercial Managed Care - HMO | Admitting: Gastroenterology

## 2013-06-19 VITALS — BP 120/62 | HR 57 | Temp 97.9°F | Resp 13 | Ht 61.0 in | Wt 180.0 lb

## 2013-06-19 DIAGNOSIS — D126 Benign neoplasm of colon, unspecified: Secondary | ICD-10-CM

## 2013-06-19 DIAGNOSIS — Z1211 Encounter for screening for malignant neoplasm of colon: Secondary | ICD-10-CM

## 2013-06-19 LAB — GLUCOSE, CAPILLARY
Glucose-Capillary: 125 mg/dL — ABNORMAL HIGH (ref 70–99)
Glucose-Capillary: 122 mg/dL — ABNORMAL HIGH (ref 70–99)

## 2013-06-19 MED ORDER — SODIUM CHLORIDE 0.9 % IV SOLN: 500.0000 mL | INTRAVENOUS | Status: DC

## 2013-06-19 NOTE — Progress Notes (Signed)
A/ox3 pleased with MAC, report to Karen RN 

## 2013-06-19 NOTE — Op Note (Signed)
[place]  [address] [place], [id number]   COLONOSCOPY PROCEDURE REPORT  PATIENT: Whitney [name], Whitney [name]  MR#: [id number] BIRTHDATE: February 07, 1935 , 78  yrs. old GENDER: Female ENDOSCOPIST: Milus Banister, MD PROCEDURE DATE:  06/19/2013 PROCEDURE:   Colonoscopy with snare polypectomy First Screening Colonoscopy - Avg.  risk and is 50 yrs.  old or older - No.  Prior Negative Screening - Now for repeat screening. 10 or more years since last screening  History of Adenoma - Now for follow-up colonoscopy & has been > or = to 3 yrs.  N/A  Polyps Removed Today? Yes. ASA CLASS:   Class II INDICATIONS:average risk screening. MEDICATIONS: MAC sedation, administered by CRNA and propofol (Diprivan) 129m IV  DESCRIPTION OF PROCEDURE:   After the risks benefits and alternatives of the procedure were thoroughly explained, informed consent was obtained.  A digital rectal exam revealed no abnormalities of the rectum.   The LB [id number] endoscope was introduced through the anus and advanced to the cecum, which was identified by both the appendix and ileocecal valve. No adverse events experienced.   The quality of the prep was excellent.  The instrument was then slowly withdrawn as the colon was fully examined.  COLON FINDINGS: Two polyps were found, removed and sent to pathology.  These were both sessile, 2-316macross, located in transverse and sigmoid segments, were removed with cold snare. There were a few left sided diverticulum.  The examination was otherwise normal.  Retroflexed views revealed no abnormalities. The time to cecum=5 minutes 26 seconds.  Withdrawal time=10 minutes 34 seconds.  The scope was withdrawn and the procedure completed. COMPLICATIONS: There were no complications.  ENDOSCOPIC IMPRESSION: Two polyps were found, removed and sent to pathology. There were a few left sided diverticulum. The examination was otherwise  normal.  RECOMMENDATIONS: You will receive a letter within 1-2 weeks with the results of your biopsy as well as final recommendations.  Please call my office if you have not received a letter after 3 weeks.   eSigned:  DaMilus BanisterMD 06/19/2013 9:49 AM   cc: ScTeressa LowerMD

## 2013-06-19 NOTE — Progress Notes (Signed)
Called to room to assist during endoscopic procedure.  Patient ID and intended procedure confirmed with present staff. Received instructions for my participation in the procedure from the performing physician.  

## 2013-06-19 NOTE — Patient Instructions (Signed)
YOU HAD AN ENDOSCOPIC PROCEDURE TODAY AT THE [facility]: Refer to the procedure report that was given to you for any specific questions about what was found during the examination.  If the procedure report does not answer your questions, please call your gastroenterologist to clarify.  If you requested that your care partner not be given the details of your procedure findings, then the procedure report has been included in a sealed envelope for you to review at your convenience later.  YOU SHOULD EXPECT: Some feelings of bloating in the abdomen. Passage of more gas than usual.  Walking can help get rid of the air that was put into your GI tract during the procedure and reduce the bloating. If you had a lower endoscopy (such as a colonoscopy or flexible sigmoidoscopy) you may notice spotting of blood in your stool or on the toilet paper. If you underwent a bowel prep for your procedure, then you may not have a normal bowel movement for a few days.  DIET: Your first meal following the procedure should be a light meal and then it is ok to progress to your normal diet.  A half-sandwich or bowl of soup is an example of a good first meal.  Heavy or fried foods are harder to digest and may make you feel nauseous or bloated.  Likewise meals heavy in dairy and vegetables can cause extra gas to form and this can also increase the bloating.  Drink plenty of fluids but you should avoid alcoholic beverages for 24 hours.  ACTIVITY: Your care partner should take you home directly after the procedure.  You should plan to take it easy, moving slowly for the rest of the day.  You can resume normal activity the day after the procedure however you should NOT DRIVE or use heavy machinery for 24 hours (because of the sedation medicines used during the test).    SYMPTOMS TO REPORT IMMEDIATELY: A gastroenterologist can be reached at any hour.  During normal business hours, 8:30 AM to 5:00 PM Monday through Friday,  call [phone].  After hours and on weekends, please call the GI answering service at [phone] who will take a message and have the physician on call contact you.   Following lower endoscopy (colonoscopy or flexible sigmoidoscopy):  Excessive amounts of blood in the stool  Significant tenderness or worsening of abdominal pains  Swelling of the abdomen that is new, acute  Fever of 100F or higher    FOLLOW UP: If any biopsies were taken you will be contacted by phone or by letter within the next 1-3 weeks.  Call your gastroenterologist if you have not heard about the biopsies in 3 weeks.  Our staff will call the home number listed on your records the next business day following your procedure to check on you and address any questions or concerns that you may have at that time regarding the information given to you following your procedure. This is a courtesy call and so if there is no answer at the home number and we have not heard from you through the emergency physician on call, we will assume that you have returned to your regular daily activities without incident.  SIGNATURES/CONFIDENTIALITY: You and/or your care partner have signed paperwork which will be entered into your electronic medical record.  These signatures attest to the fact that that the information above on your After Visit Summary has been reviewed and is understood.  Full responsibility of the confidentiality   of this discharge information lies with you and/or your care-partner.     

## 2013-06-20 ENCOUNTER — Telehealth: Payer: Self-pay

## 2013-06-20 NOTE — Telephone Encounter (Signed)
  Follow up Call-  Call back number 06/19/2013  Post procedure Call Back phone  # [phone]  Permission to leave phone message Yes     Patient questions:  Do you have a fever, pain , or abdominal swelling? no Pain Score  0 *  Have you tolerated food without any problems? yes  Have you been able to return to your normal activities? yes  Do you have any questions about your discharge instructions: Diet   no Medications  no Follow up visit  no  Do you have questions or concerns about your Care? no  Actions: * If pain score is 4 or above: No action needed, pain <4.

## 2013-06-27 ENCOUNTER — Encounter: Payer: Self-pay | Admitting: Gastroenterology

## 2013-07-03 ENCOUNTER — Telehealth: Payer: Self-pay | Admitting: Gastroenterology

## 2013-07-04 NOTE — Telephone Encounter (Signed)
Pt was given patho results and answered all questions she will call back with any further questions or concerns

## 2013-07-19 ENCOUNTER — Telehealth: Payer: Self-pay | Admitting: Pulmonary Disease

## 2013-07-19 NOTE — Telephone Encounter (Signed)
Called and spoke with pt and she is aware of appt change in June.  Cancelled 6-9 to 6-5.  Nothing further is needed.

## 2013-09-14 ENCOUNTER — Encounter: Payer: Self-pay | Admitting: Pulmonary Disease

## 2013-09-14 ENCOUNTER — Ambulatory Visit (INDEPENDENT_AMBULATORY_CARE_PROVIDER_SITE_OTHER): Payer: Commercial Managed Care - HMO | Admitting: Pulmonary Disease

## 2013-09-14 ENCOUNTER — Ambulatory Visit (INDEPENDENT_AMBULATORY_CARE_PROVIDER_SITE_OTHER)
Admission: RE | Admit: 2013-09-14 | Discharge: 2013-09-14 | Disposition: A | Discharge status: Home / Self Care | Payer: Commercial Managed Care - HMO | Source: Ambulatory Visit | Attending: Pulmonary Disease | Admitting: Pulmonary Disease

## 2013-09-14 ENCOUNTER — Other Ambulatory Visit (INDEPENDENT_AMBULATORY_CARE_PROVIDER_SITE_OTHER): Payer: Commercial Managed Care - HMO

## 2013-09-14 VITALS — BP 124/82 | HR 64 | Temp 97.6°F | Ht 61.0 in | Wt 176.0 lb

## 2013-09-14 DIAGNOSIS — E7800 Pure hypercholesterolemia, unspecified: Secondary | ICD-10-CM

## 2013-09-14 DIAGNOSIS — K219 Gastro-esophageal reflux disease without esophagitis: Secondary | ICD-10-CM

## 2013-09-14 DIAGNOSIS — E663 Overweight: Secondary | ICD-10-CM

## 2013-09-14 DIAGNOSIS — M899 Disorder of bone, unspecified: Secondary | ICD-10-CM

## 2013-09-14 DIAGNOSIS — I10 Essential (primary) hypertension: Secondary | ICD-10-CM

## 2013-09-14 DIAGNOSIS — K5730 Diverticulosis of large intestine without perforation or abscess without bleeding: Secondary | ICD-10-CM

## 2013-09-14 DIAGNOSIS — E119 Type 2 diabetes mellitus without complications: Secondary | ICD-10-CM

## 2013-09-14 DIAGNOSIS — F411 Generalized anxiety disorder: Secondary | ICD-10-CM

## 2013-09-14 DIAGNOSIS — IMO0002 Reserved for concepts with insufficient information to code with codable children: Secondary | ICD-10-CM

## 2013-09-14 DIAGNOSIS — M1710 Unilateral primary osteoarthritis, unspecified knee: Secondary | ICD-10-CM

## 2013-09-14 DIAGNOSIS — M949 Disorder of cartilage, unspecified: Secondary | ICD-10-CM

## 2013-09-14 LAB — CBC WITH DIFFERENTIAL/PLATELET
Basophils Absolute: 0 10*3/uL (ref 0.0–0.1)
Basophils Relative: 0.4 % (ref 0.0–3.0)
Eosinophils Absolute: 0.1 10*3/uL (ref 0.0–0.7)
Eosinophils Relative: 2.1 % (ref 0.0–5.0)
HCT: 41.3 % (ref 36.0–46.0)
Hemoglobin: 13.5 g/dL (ref 12.0–15.0)
Lymphocytes Relative: 40.2 % (ref 12.0–46.0)
Lymphs Abs: 2.4 10*3/uL (ref 0.7–4.0)
MCHC: 32.7 g/dL (ref 30.0–36.0)
MCV: 80.8 fl (ref 78.0–100.0)
Monocytes Absolute: 0.4 10*3/uL (ref 0.1–1.0)
Monocytes Relative: 6.8 % (ref 3.0–12.0)
Neutro Abs: 3.1 10*3/uL (ref 1.4–7.7)
Neutrophils Relative %: 50.5 % (ref 43.0–77.0)
Platelets: 231 10*3/uL (ref 150.0–400.0)
RBC: 5.11 Mil/uL (ref 3.87–5.11)
RDW: 14.4 % (ref 11.5–15.5)
WBC: 6.1 10*3/uL (ref 4.0–10.5)

## 2013-09-14 LAB — BASIC METABOLIC PANEL
BUN: 10 mg/dL (ref 6–23)
CO2: 30 mEq/L (ref 19–32)
Calcium: 10.1 mg/dL (ref 8.4–10.5)
Chloride: 102 mEq/L (ref 96–112)
Creatinine, Ser: 0.7 mg/dL (ref 0.4–1.2)
GFR: 106.21 mL/min (ref >60.00)
Glucose, Bld: 147 mg/dL — ABNORMAL HIGH (ref 70–99)
Potassium: 3.8 mEq/L (ref 3.5–5.1)
Sodium: 139 mEq/L (ref 135–145)

## 2013-09-14 LAB — LIPID PANEL
Cholesterol: 122 mg/dL (ref 0–200)
HDL: 37.1 mg/dL — ABNORMAL LOW (ref >39.00)
LDL Cholesterol: 68 mg/dL (ref 0–99)
NonHDL: 84.9
Total CHOL/HDL Ratio: 3
Triglycerides: 84 mg/dL (ref 0.0–149.0)
VLDL: 16.8 mg/dL (ref 0.0–40.0)

## 2013-09-14 LAB — HEPATIC FUNCTION PANEL
ALT: 55 U/L — ABNORMAL HIGH (ref 0–35)
AST: 46 U/L — ABNORMAL HIGH (ref 0–37)
Albumin: 4 g/dL (ref 3.5–5.2)
Alkaline Phosphatase: 44 U/L (ref 39–117)
Bilirubin, Direct: 0.2 mg/dL (ref 0.0–0.3)
Total Bilirubin: 1.5 mg/dL — ABNORMAL HIGH (ref 0.2–1.2)
Total Protein: 7.6 g/dL (ref 6.0–8.3)

## 2013-09-14 LAB — HEMOGLOBIN A1C: Hgb A1c MFr Bld: 7.3 % — ABNORMAL HIGH (ref 4.6–6.5)

## 2013-09-14 LAB — TSH: TSH: 1.13 u[IU]/mL (ref 0.35–4.50)

## 2013-09-14 NOTE — Progress Notes (Signed)
Subjective:    Patient ID: Whitney [name], female    DOB: 14-Jul-1934, 79 y.o.   MRN: [id number]  HPI 79 y/o BF here for a follow up visit... she has mult med problems as noted below...    ~  February 23, 2012:  74moROV & Whitney [name] now has a hearing aide for her left ear;  We reviewed the following medical problems today:    HBP> on ASA81, Amlod10, LisinHct20-12.5; BP= 124/76 & she denies CP, palpit, SOB, edema, etc...    Chol> on Simva20; last FLP 5/13 showed TChol 113, TG 52, HDL 41, LDL 62    DM> on Lantus30, Metform1000Bid, Glim4; labs today showed BS=110, A1c=7.2; continue same meds, better diet...    Overweight> wt is down 2# to 179# today; we reviewed diet, exercise, wt reduction strategies...    DJD, LBP, Neck pain> she notes arthritic complaints are "better" at present using OTC analgesics...    Hx Osteopenia> on Ca & MVI; last BMD 8/11 was normal...    Anxiety> not on meds, she manages very well... We reviewed prob list, meds, xrays and labs> see below for updates >> ok Flu vaccine today...  LABS 11/13:  Chems- ok w/ BS=110 A1c=7.2   ~  Aug 23, 2012:  646moOV & Whitney [name] reports a good interval w/o new complaints or concerns... We reviewed the following medical problems during today's office visit >>     HBP> on ASA81, Amlod10, LisinHct20-12.5; BP= 130/72 & she denies CP, palpit, SOB, edema, etc...    Chol> on Simva20; FLP 5/14 shows TChol 117, TG 111, HDL 34, LDL 61    DM> on Lantus30, Metform1000Bid, Glim4; labs today showed BS=106, A1c=7.3 (stable); continue same meds, better diet...    Overweight> wt is stable at 178# today; we reviewed diet, exercise, wt reduction strategies...    GI- GERD, Divertics, constip> on OTC Prilosec prn + LOC; last colon 2005 w/ divertics only    ?FLD> Labs showed sl incr SGOT=44, SGPT=50; advised low fat diet & wt reduction...    DJD, LBP, Neck pain> she notes arthritic complaints are "better" at present using OTC analgesics...    Hx Osteopenia> on Ca  & MVI; last BMD 8/11 was normal...    Anxiety> not on meds, she manages very well... We reviewed prob list, meds, xrays and labs> see below for updates >>   LABS 5/14:  FLP- at goals on Simva20 x HDL=34;  Chems- ok x BS=106, A1c=7.3, sl incr LFTs;  CBC- wnl;  TSH=1.22;  UA- clear...   ~  March 12, 2013:  6-42m35moV & Whitney [name] concerned about some sinus drainage; she had her house paint tested for lead & it came back positive, no she is concerned that the drainage is due to lead poisoning & she wants a blood lead level done; I tried to reassure her- CBCs have been normal, no basophilic stippling, etc but we agreed to check lead level to give her some peace of mind... Rec that she try OTC antihist + mucinex for the drainage.    HBP> on ASA81, Amlod10, LisinHct20-12.5; BP= 126/70 & she denies CP, palpit, SOB, edema, etc...    Chol> on Simva20; FLP 5/14 shows TChol 117, TG 111, HDL 34, LDL 61    DM> on Lantus30, Metform1000Bid, Glim4; labs today showed BS=96, A1c=7.6 (sl worse); continue same meds, better diet...    Overweight> wt is stable at 178# today; we reviewed diet, exercise, wt reduction strategies...Marland KitchenMarland Kitchen  GI- GERD, Divertics, constip> on OTC Prilosec prn + LOC; last colon 2005 w/ divertics only    ?FLD> Labs 5/14 showed sl incr SGOT=44, SGPT=50; advised low fat diet & wt reduction...    DJD, LBP, Neck pain> she notes arthritic complaints are "better" at present using OTC analgesics...    Hx Osteopenia> on Ca & MVI; last BMD 8/11 was normal...    Anxiety> not on meds, she manages very well... We reviewed prob list, meds, xrays and labs> see below for updates >> OK 2014 flu vaccine today & refill of meds...  LABS 12/14:  Chems- ok w/ BS=96, A1c=7.6 on her 3 meds...   NOTE> Blood lead level returned <2 and normal is <10... She is reassured.  ~  September 14, 2013:  30moROV & Whitney [name] reports a good interval- no new complaints or concerns...     HBP> on ASA81, Amlod10, LisinHct20-12.5; BP= 124/82 &  she denies CP, palpit, SOB, edema, etc...    Chol> on Simva20; FLP 6/15 shows TChol 122, TG 84, HDL 37, LDL 68... Looks good- continue same med & diet...    DM> on Lantus30, Metform1000Bid, Glim4; labs today showed BS=147, A1c=7.3; continue same meds, better diet, get wt down!...    Overweight> wt is stable at 176# today (down 2#); BMI is 33-34; we reviewed diet, exercise, wt reduction strategies...    GI- GERD, Divertics, constip> on OTC Prilosec prn + LOC; f/u colon done 3/15 by drJacobs- 2 polyps removed (tubular adenomas), few divertics, f/u planned 510yr..    ?FLD> Labs 6/15 showed sl incr SGOT=46, SGPT=55 (similar to prev); advised low fat diet & wt reduction- needs to do a better job!..Marland Kitchen    DJD, LBP, Neck pain> she notes arthritic complaints are "better" at present using OTC analgesics...    Hx Osteopenia> on Ca & MVI; last BMD 8/11 was normal...    Anxiety> not on meds, she manages very well... We reviewed prob list, meds, xrays and labs> see below for updates >>   CXR 6/15 showed norm heart size, prominent vasc markings, NAD...  LABS 6/15:  FLP- all parameters wnl on Simva20;  Chems- ok x BS=147, A1c=7.3, GOT=46, GPT=55;  CBC- wnl;  TSH=1.13... PLAN> rec continue same meds- Simva20, Lantus30, Metform1000Bid, Glim4Qam- but needs better diet & weight loss to improve A1c & LFTs (steatosis)...          Problem List:  GLAUCOMA (ICD-365.9) - eval by DrGroat & Rx w/ Travatan eye drops...  ~  2012:  She developed some exudates in right eye & DrGroat sent her to DrSanders 6/12 who did some type of surg on her retina in his office (we don't have notes to review) ==> she reports that DrSanders released her back to DrGroat's care, doing well...  [facility]  HYPERTENSION (ICD-401.9) - on NORVASC '10mg'$ /d  & LISINOPRIL/HCT 20-12.5 daily... ~  Last CXR 2002 showed heart at upper lim normal, clear lungs, NAD... ~  1/11:  went to ER  w/ incr BP readings 170-180/ 90-100 & Norvasc incr to '10mg'$ /d... ~  2/11:  BP 140/88 & we decided to change the Lisinopril20 to LISINOPRIL/ HCT 20-12.5 daily;  All BP reading since then have been WNL... ~  11/11: BP=120/68 and similar at home she says... denies HA, fatigue, visual changes, CP, palipit, dizziness, syncope, dyspnea, edema, etc... ~  5/12:  BP= 122/62 & feeling well, remains asymptomatic... ~  11/12:  BP= 130/82 & she remains asymptomatic... ~  5/13:  BP= 124/70 & feeling well w/o CP, palpit, dizzy, SOB, edema, etc; she walks regularly w/ Sabino Donovan. ~  CXR 5/13 showed normal heart size, clear lungs (sl incr markings) w/ low lung vols, NAD... ~  11/13:  BP= 124/76 & she denies CP, palpit, SOB, edema, etc... ~  5/14: on ASA81, Amlod10, LisinHct20-12.5; BP= 130/72 & she denies CP, palpit, SOB, edema, etc ~  12/14: on ASA81, Amlod10, LisinHct20-12.5; BP= 126/70 & she remains asymptomatic... ~  6/15: on ASA81, Amlod10, LisinHct20-12.5; BP= 124/82 & she remains asymptomatic... ~  CXR 6/15 showed norm heart size, prominent vasc markings, NAD  CHEST PAIN (ICD-786.50) - on ASA '81mg'$ /d... Atypical CP eval 11/06 by Sol Passer- EKG w/ NSSTTWA, & NuclearStressTest neg- no ischemia or infarct, EF=70%... ~  5/12:  She denies CP, palpit, SOB, edema, etc;  We reviewed exercise recommendations...  HYPERCHOLESTEROLEMIA (ICD-272.0) - on SIMVASTATIN '20mg'$ /d... she tries to follow a low carb, low fat diet... ~  baseline FLP 5/04 on diet alone TChol 270, TG 108, HDL 35, LDL 214... ~  [place] 9/08 on Simvastatin '20mg'$ /d showed TChol 150, TG 92, HDL 36, LDL 95... ~  [place] 1/09 showed TChol 137, TG 89, HDL 32, LDL 87... rec- continue the Simva20. ~  FLP 5/10 showed TChol 124, TG 69, HDL 34, LDL 77 ~  FLP 5/11 showed TChol 141, TG 100, HDL 38, LDL 84 ~  FLP 11/11 showed TChol 126, TG 71, HDL 34, LDL 78 ~  FLP 5/12 on Simva20 showed TChol 125, TG 85, HDL 33, LDL 75 ~  FLP 11/12 on Simva20 showed TChol 127, TG 89, HDL  42, LDL 68 ~  FLP 5/13 on Simva20 showed TChol 113, TG 52, HDL 41, LDL 62 ~  FLP 5/14 on Simva20 showed TChol 117, TG 111, HDL 34, LDL 61  ~  FLP 6/15 on Simva20 showed TChol 122, TG 84, HDL 37, LDL 68   DIABETES MELLITUS, TYPE II, UNCONTROLLED (ICD-250.02) - prev eval for Endocrine by DrKrege... takes LANTUS 30u daily, plus METFORMIN '1000mg'$ Bid & GLIMEPERIDE '4mg'$ /d... she's been trying to follow diet, incr exercise & lose wt.... diet counselling from [facility]- Maggie May. ~  labs 9/08 showed BS=172 and HgA1c=7.5.Marland KitchenMarland Kitchen she never increased her Lantus, but states her BS at home improved to 100-120 range... ~  labs 1/09 showed BS= 132,  HgA1c= 6.8.Marland Kitchen. ~  labs 5/09 showed BS= 142, HgA1c= 7.0.Marland KitchenMarland Kitchen rec sl incr Lantus til FBS~ 100-110 (she didn't do this). ~  labs 11/09 showed BS= 109, A1c= 7.2 ~  labs 5/10 showed BS= 161, A1c= 7.8.Marland Kitchen. rec> incr Lantus slowly til FBS 100-120 (she never did). ~  labs 11/10 showed BS= 155, A1c= 8.0.Marland Kitchen. rec> incr Lantus to 26 now & titrate up. ~  labs 2/11 showed A1c= 7.7.Marland Kitchen. rec> incr Lantus to 30. ~  labs 5/11 showed BS= 142, A1c= 7.3.Marland KitchenMarland Kitchen continue same, get wt down. ~  labs 11/11 showed BS= 121, A1c= 7.3.Marland KitchenMarland Kitchen same meds, diet, exerc, wt reduction. ~  labs 5/12 on Lant30+Met1000Bid+Glim4 showed BS=119, A1c=7.3 ~  Labs 11/12 on Lant30+Met1000Bid+Glim4 showed BS=108, A1c=7.2 ~  Labs 5/13 on Lant30+Met1000Bid+Glim4 showed BS=106, A1c=7.2 ~  Labs 11/13 on Lant30+Met1000Bid+Glim4 showed BS=110, A1c=7.2 ~  Labs 5/14 on Lant30+Met1000Bid+Glim4 showed BS=106, A1c=7.3 ~  Labs 12/14 on Lant30+Met1000Bid+Glim4 showed BS=96, A1c=7.6 ~  Labs 6/15 on Lantus30+Met1000Bid+Glim4 showed BS=147, A1c=7.3  OBESITY (ICD-278.00) - we've discussed diet + exercise & she's been to [facility] for  counselling... ~  11/11:  Weight =182#... 5' tall > BMI~ 35... ~  5/12:  Weight = 180# ~  11/12:  Weight = 181# ~  5/13:  Weight = 182# ~  11/13:  Weight = 179# ~  5/14:  Weight = 178# ~  12/14   Weight = 178# ~  6/15:  Weight = 176#  GERD (ICD-530.81) - uses Prilosec OTC '20mg'$ /d as needed... no prev EGD.  DIVERTICULOSIS, COLON (ICD-562.10) - last colonoscopy 3/05 by DrSam w/ divertics only, f/u planned 33yr... she has some mild constipation & uses "smooth move" and dulcolax as needed... ~  3/15: she had f/u colonoscopy by DrJacobs- 2 polyps removed (tub adenomas) & few divertics; f/u rec in 5 yrs...  ABNORMAL LFTs >> likely FATTY LIVER DISEASE >> ~  Labs 6/15 showed SGOT=46, SGPT=55  DEGENERATIVE JOINT DISEASE, KNEE (ICD-715.96) - eval 2006 by DrDraper... Rx OTC NSAID's (Ibuprofen) as needed...  ~  11/09 c/o left shoulder pain... rec to try Etodolac and refer to DrSypher- PT helped. ~  5/13:  She states actually doing quite well, [facility] at [facility] & uses OTC Ibuprofen prn...  BAKER'S CYST (ICD-727.51) w/ prev eval from DrMurphy/ Ortho...  SPONDYLOSIS, LUMBAR (ICD-721.3) - eval by DrYates 1990 w/ conservative rx rec...  DISC DISEASE, CERVICAL (ICD-722.4) - she notes that massage helps...  Hx of OSTEOPENIA (ICD-733.90) - she takes Calcium, MVI, Vit D... ~  BMD 2004 w/ TScore -1.7 in spine... ~  BMD repeat 5/09 was WNL w/ TScores +0.0 in Spine,  &  +0.8 in right FemNeck. ~  labs 5/11 showed Vit D level = 40... continue MVI, Calcium, etc... ~  BMD 8/11 showed TScores +0.3 in Spine, [facility] ~  Labs 5/13 showed Vit D level = 66  FACIAL PAIN, ATYPICAL (ICD-350.2) - eval 2001 by neuro/ DrSchmidt and resolved...  ANXIETY (ICD-300.00) - prev used Alprazolam Prn but off now... her husb passed away 6/10 & she has slowly recovered...  ONYCHOMYCOSIS (ICD-110.1) - eval by Podiatry & foot care clinic in the past... ~  2/11: she reports recent eval by DrTuckman for pain right lat malleolus- Rx Keflex.  NOTE:  I attempted to confirm med refills & compliance w/ her Pharm- she actually uses 3 pharmacies and can't tell me why? [place]... ~  Immunizations:  she gets yearly seasonal Flu vaccines... had TETANUS shot 2004 & given TDAP 5/13... PNEUMOVAX in 242(age 79 & 2011 (age 79.   Past Surgical History  Procedure Laterality Date  . Vaginal hysterectomy  2002    Dr. HKem Kaysrepair  . Right eye surgery  09/2010    retina in right eye    Outpatient Encounter Prescriptions as of 09/14/2013  Medication Sig  . amLODipine (NORVASC) 10 MG tablet TAKE ONE TABLET BY MOUTH EVERY DAY  . aspirin 81 MG tablet Take 81 mg by mouth daily.    . Blood Glucose Monitoring Suppl (PRODIGY AUTOCODE BLOOD GLUCOSE) DEVI Test blood sugar as directed  . Calcium Carbonate-Vitamin D (CALCIUM-VITAMIN D) 500-200 MG-UNIT per tablet Take 1 tablet by mouth 2 (two) times daily with a meal.    . glimepiride (AMARYL) 4 MG tablet TAKE ONE TABLET BY MOUTH EVERY DAY BEFORE BREAKFAST  . glucose blood test strip Use as instructed  . insulin glargine (LANTUS) 100 UNIT/ML injection INJECT 30 UNITS SUBCUTANEOUSLY AT BEDTIME  . lisinopril-hydrochlorothiazide (PRINZIDE,ZESTORETIC) 20-12.5 MG per tablet TAKE ONE  TABLET BY MOUTH EVERY DAY  . metFORMIN (GLUCOPHAGE) 1000 MG tablet TAKE ONE TABLET BY MOUTH TWICE DAILY WITH MEALS  . Multiple Vitamin (MULTI VITAMIN DAILY PO)   . PRODIGY TWIST TOP LANCETS 28G MISC Use as directed  . simvastatin (ZOCOR) 20 MG tablet TAKE ONE TABLET BY MOUTH AT BEDTIME  . travoprost, benzalkonium, (TRAVATAN) 0.004 % ophthalmic solution Place 1 drop into both eyes at bedtime.    . [DISCONTINUED] Vitamins A & D 5000-400 UNITS CAPS     No Known Allergies   Current Medications, Allergies, Past Medical History, Past Surgical History, Family History, and Social History were reviewed in [employer] record.    Review of Systems        See HPI - all other systems neg except as noted... The patient denies anorexia, fever, weight loss, weight gain, vision loss, decreased hearing,  hoarseness, chest pain, syncope, dyspnea on exertion, peripheral edema, prolonged cough, headaches, hemoptysis, abdominal pain, melena, hematochezia, severe indigestion/heartburn, hematuria, incontinence, muscle weakness, suspicious skin lesions, transient blindness, difficulty walking, depression, unusual weight change, abnormal bleeding, enlarged lymph nodes, and angioedema.     Objective:   Physical Exam     WD, Overwt, 80 y/o BF in NAD... Vital Signs:  Reviewed... GENERAL:  Alert & oriented; pleasant & cooperative... HEENT:  [place]/AT, EOM-wnl, PERRLA, EACs-clear, TMs-wnl, NOSE-clear, THROAT-clear & wnl. NECK:  Supple w/ fairROM; no JVD; normal carotid impulses w/o bruits; no thyromegaly or nodules palpated; no lymphadenopathy. CHEST:  Clear to P & A; without wheezes/ rales/ or rhonchi. HEART:  Regular Rhythm; without murmurs/ rubs/ or gallops. ABDOMEN:  Soft & nontender, w/ +panniculus, normal bowel sounds; no organomegaly or masses detected. EXT: without deformities, mild arthritic changes; no varicose veins/ venous insuffic/ or edema. NEURO:  CN's intact;  no focal neuro deficits... DERM:  No lesions noted; no rash etc...  RADIOLOGY DATA:  Reviewed in the EPIC EMR & discussed w/ the patient...  LABORATORY DATA:  Reviewed in the EPIC EMR & discussed w/ the patient...   Assessment & Plan:    GLAUCOMA & Exudates noted>  Rx w/ eye drops per DrGroat; She was referred to DrSanders for retina eval & he did some kind of surg on her right eye 6/12 in his office (we don't have records); she tells me that he has released her back to DrGroat's care, doing satis... Asked to get note sent to [place] regarding retinopathy findings.  HBP>  Controlled on CCB, ACE, Diuretic; continue same...  Hx CP>  Denies angina, [facility]...  CHOL>  Looks good on Simva20...  DM>  Control fair on 3 meds, needs better diet & get wt down!  Obesity>  As above, we discussed cutting back on caloric  intake & increasing output... Abn LFTs, NAFLD>  She understands the process 7 need for wt reduction to improve her LFTs...  GI>  GERD, Constip, Divertics>  She notes some constip & we discussed fiber etc... Adenomatous colon polyps removed 3/15...  ORTHO>  She is off the etodolac & uses Tylenol, Aleve, etc...  ANXIETY over lead in her paint & sinus drainage>> I reassured her that there is no connection; we checked the blood lead level & it was WNL.Marland KitchenMarland Kitchen   Patient's Medications  New Prescriptions   No medications on file  Previous Medications   AMLODIPINE (NORVASC) 10 MG TABLET    TAKE ONE TABLET BY MOUTH EVERY DAY   ASPIRIN 81 MG TABLET  Take 81 mg by mouth daily.     BLOOD GLUCOSE MONITORING SUPPL (PRODIGY AUTOCODE BLOOD GLUCOSE) DEVI    Test blood sugar as directed   CALCIUM CARBONATE-VITAMIN D (CALCIUM-VITAMIN D) 500-200 MG-UNIT PER TABLET    Take 1 tablet by mouth 2 (two) times daily with a meal.     GLIMEPIRIDE (AMARYL) 4 MG TABLET    TAKE ONE TABLET BY MOUTH EVERY DAY BEFORE BREAKFAST   INSULIN GLARGINE (LANTUS) 100 UNIT/ML INJECTION    INJECT 30 UNITS SUBCUTANEOUSLY AT BEDTIME   LISINOPRIL-HYDROCHLOROTHIAZIDE (PRINZIDE,ZESTORETIC) 20-12.5 MG PER TABLET    TAKE ONE TABLET BY MOUTH EVERY DAY   METFORMIN (GLUCOPHAGE) 1000 MG TABLET    TAKE ONE TABLET BY MOUTH TWICE DAILY WITH MEALS   MULTIPLE VITAMIN (MULTI VITAMIN DAILY PO)       PRODIGY TWIST TOP LANCETS 28G MISC    Use as directed   SIMVASTATIN (ZOCOR) 20 MG TABLET    TAKE ONE TABLET BY MOUTH AT BEDTIME   TRAVOPROST, BENZALKONIUM, (TRAVATAN) 0.004 % OPHTHALMIC SOLUTION    Place 1 drop into both eyes at bedtime.    Modified Medications   Modified Medication Previous Medication   PRODIGY NO CODING BLOOD GLUC TEST STRIP glucose blood test strip      USE AS INSTRUCTED    Use as instructed  Discontinued Medications   VITAMINS A & D 5000-400 UNITS CAPS

## 2013-09-14 NOTE — Patient Instructions (Signed)
Today we updated your med list in our EPIC system...    Continue your current medications the same...  Today we did a follow up CXR & your FASTING blood work...    We will contact you w/ the results when available...   Keep up the good work w/ diet & exercise!!!  Call for any questions...  Let's plan a follow up visit in 102mo, sooner if needed for problems.Marland KitchenMarland Kitchen

## 2013-09-18 ENCOUNTER — Telehealth: Payer: Self-pay | Admitting: Pulmonary Disease

## 2013-09-18 ENCOUNTER — Ambulatory Visit: Payer: Medicare Other | Admitting: Pulmonary Disease

## 2013-09-18 DIAGNOSIS — E119 Type 2 diabetes mellitus without complications: Secondary | ICD-10-CM

## 2013-09-18 NOTE — Telephone Encounter (Signed)
Spoke with Robin at Dr Sherril Croon office  She states that the pt is there now waiting for referral from SN  She is having her Diabetic eye exam today  Shirlean Mylar checked with pt's insurance and SN is listed as PCP until 2018  VO for referral given and also placed in Epic

## 2013-09-18 NOTE — Telephone Encounter (Signed)
Pt @ eye doc waiting referral for diabatic eye exam.Whitney [name]

## 2013-09-20 ENCOUNTER — Telehealth: Payer: Self-pay | Admitting: Pulmonary Disease

## 2013-09-20 NOTE — Telephone Encounter (Signed)
Called  And spoke with pt and she spoke with ashtyn this morning about her lab results.  Nothing further is needed.

## 2013-09-26 ENCOUNTER — Other Ambulatory Visit: Payer: Self-pay | Admitting: Pulmonary Disease

## 2013-11-09 ENCOUNTER — Other Ambulatory Visit: Payer: Self-pay | Admitting: Pulmonary Disease

## 2013-12-26 ENCOUNTER — Other Ambulatory Visit: Payer: Self-pay

## 2013-12-26 DIAGNOSIS — Z1231 Encounter for screening mammogram for malignant neoplasm of breast: Secondary | ICD-10-CM

## 2014-01-01 ENCOUNTER — Telehealth: Payer: Self-pay | Admitting: Pulmonary Disease

## 2014-01-01 DIAGNOSIS — Z1231 Encounter for screening mammogram for malignant neoplasm of breast: Secondary | ICD-10-CM

## 2014-01-01 NOTE — Telephone Encounter (Signed)
Per SN: Ok to place referral for mammogram

## 2014-01-01 NOTE — Telephone Encounter (Signed)
Called spoke with pt. She is needing referral to [facility] for mammogram. Please advise SN thanks

## 2014-01-01 NOTE — Telephone Encounter (Signed)
Spoke with the pt and notified ok per SN for referral  Order placed

## 2014-01-17 ENCOUNTER — Ambulatory Visit
Admission: RE | Admit: 2014-01-17 | Discharge: 2014-01-17 | Disposition: A | Discharge status: Home / Self Care | Payer: Commercial Managed Care - HMO | Source: Ambulatory Visit

## 2014-01-17 DIAGNOSIS — Z1231 Encounter for screening mammogram for malignant neoplasm of breast: Secondary | ICD-10-CM

## 2014-01-29 ENCOUNTER — Encounter: Payer: Self-pay | Admitting: Internal Medicine

## 2014-02-05 ENCOUNTER — Other Ambulatory Visit: Payer: Self-pay | Admitting: Geriatric Medicine

## 2014-02-06 ENCOUNTER — Other Ambulatory Visit: Payer: Self-pay | Admitting: Pulmonary Disease

## 2014-02-07 ENCOUNTER — Encounter: Payer: Self-pay | Admitting: Internal Medicine

## 2014-02-07 ENCOUNTER — Ambulatory Visit (INDEPENDENT_AMBULATORY_CARE_PROVIDER_SITE_OTHER): Payer: Commercial Managed Care - HMO | Admitting: Internal Medicine

## 2014-02-07 VITALS — BP 122/66 | HR 70 | Temp 98.1°F | Resp 14 | Ht 61.0 in | Wt 176.4 lb

## 2014-02-07 DIAGNOSIS — E669 Obesity, unspecified: Secondary | ICD-10-CM

## 2014-02-07 DIAGNOSIS — M8580 Other specified disorders of bone density and structure, unspecified site: Secondary | ICD-10-CM

## 2014-02-07 DIAGNOSIS — E7800 Pure hypercholesterolemia, unspecified: Secondary | ICD-10-CM

## 2014-02-07 DIAGNOSIS — Z23 Encounter for immunization: Secondary | ICD-10-CM

## 2014-02-07 DIAGNOSIS — E119 Type 2 diabetes mellitus without complications: Secondary | ICD-10-CM

## 2014-02-07 DIAGNOSIS — I10 Essential (primary) hypertension: Secondary | ICD-10-CM

## 2014-02-07 MED ORDER — SITAGLIPTIN PHOSPHATE 50 MG PO TABS: 50.0000 mg | ORAL_TABLET | Freq: Every day | ORAL | Status: DC

## 2014-02-07 NOTE — Progress Notes (Signed)
Pre visit review using our clinic review tool, if applicable. No additional management support is needed unless otherwise documented below in the visit note. 

## 2014-02-07 NOTE — Patient Instructions (Addendum)
We will have you stop taking the lantus injections every day.   We will add a medicine called januvia for your diabetes, this will help to reduce your appetite and encourage some weight loss. It is a medicine for diabetes. We will check the blood test for diabetes when you come back in 3 months.  Keep up the good work with exercise.   Sitagliptin oral tablet What is this medicine? SITAGLIPTIN (sit a GLIP tin) helps to treat type 2 diabetes. It helps to control blood sugar. Treatment is combined with diet and exercise. This medicine may be used for other purposes; ask your health care provider or pharmacist if you have questions. COMMON BRAND NAME(S): Januvia What if I miss a dose? If you miss a dose, take it as soon as you can. If it is almost time for your next dose, take only that dose. Do not take double or extra doses. What should I watch for while using this medicine? Visit your doctor or health care professional for regular checks on your progress. A test called the HbA1C (A1C) will be monitored. This is a simple blood test. It measures your blood sugar control over the last 2 to 3 months. You will receive this test every 3 to 6 months. Learn how to check your blood sugar. Learn the symptoms of low and high blood sugar and how to manage them. Always carry a quick-source of sugar with you in case you have symptoms of low blood sugar. Examples include hard sugar candy or glucose tablets. Make sure others know that you can choke if you eat or drink when you develop serious symptoms of low blood sugar, such as seizures or unconsciousness. They must get medical help at once. Tell your doctor or health care professional if you have high blood sugar. You might need to change the dose of your medicine. If you are sick or exercising more than usual, you might need to change the dose of your medicine. Do not skip meals. Ask your doctor or health care professional if you should avoid alcohol. Many  nonprescription cough and cold products contain sugar or alcohol. These can affect blood sugar. Wear a medical ID bracelet or chain, and carry a card that describes your disease and details of your medicine and dosage times. What side effects may I notice from receiving this medicine? Side effects that you should report to your doctor or health care professional as soon as possible: -allergic reactions like skin rash, itching or hives, swelling of the face, lips, or tongue -breathing problems -fever, chills -loss of appetite -signs and symptoms of low blood sugar such as feeling anxious, confusion, dizziness, increased hunger, unusually weak or tired, sweating, shakiness, cold, irritable, headache, blurred vision, fast heartbeat, loss of consciousness -unusual stomach pain or discomfort -vomiting Side effects that usually do not require medical attention (report to your doctor or health care professional if they continue or are bothersome): -diarrhea -headache -sore throat -stomach upset -stuffy or runny nose This list may not describe all possible side effects. Call your doctor for medical advice about side effects. You may report side effects to FDA at 1-800-FDA-1088. Where should I keep my medicine? Keep out of the reach of children. Store at room temperature between 15 and 30 degrees C (59 and 86 degrees F). Throw away any unused medicine after the expiration date. NOTE: This sheet is a summary. It may not cover all possible information. If you have questions about this medicine, talk to  your doctor, pharmacist, or health care provider.  2015, [employer]vier/Gold Standard. (2012-07-12 12:04:01)

## 2014-02-08 NOTE — Assessment & Plan Note (Signed)
Continue amlodipine and lisinopril/hctz 20/12.5. BP well controlled today.

## 2014-02-08 NOTE — Assessment & Plan Note (Signed)
Will stop lantus and add [place]. See if this helps with her goal of weight loss.

## 2014-02-08 NOTE — Assessment & Plan Note (Signed)
Continue zocor 

## 2014-02-08 NOTE — Assessment & Plan Note (Signed)
Continue vit d and calcium and weight bearing exercise.

## 2014-02-08 NOTE — Assessment & Plan Note (Signed)
Will stop lantus (weight encouraging) and start [place]. Continue amaryl and metformin. Can switch amaryl out for sglp-2 next visit if doing well. Foot exam done. Check HgA1c at next visit.

## 2014-02-08 NOTE — Progress Notes (Signed)
   Subjective:    Patient ID: Whitney [name], female    DOB: [birth year], 79 y.o.   MRN: [id number]  HPI The patient is a 79 YO female who is coming in to establish care. She has PMH of DM type 2, obesity, HTN, osteoarthritis. She has no new problems and denies hyper/hypoglycemia. She takes lantus and medications for her diabetes. She has been trying to lose some weight and is not having good success. She is trying to exercise and eat okay for her diabetes. She denies chest pains, SOB, balance problems, recent falls. Has occasional tingling in her feet but not consistent and has good feeling in her feet. She has mild signs of diabetes in her eyes but sees the eye doctor yearly.   Review of Systems  Constitutional: Negative for fever, activity change, appetite change, fatigue and unexpected weight change.  HENT: Negative.   Eyes: Negative.   Respiratory: Negative for cough, chest tightness, shortness of breath and wheezing.   Cardiovascular: Negative for chest pain, palpitations and leg swelling.  Gastrointestinal: Negative for abdominal pain, diarrhea, constipation and abdominal distention.  Endocrine: Negative.   Musculoskeletal: Positive for arthralgias. Negative for back pain, gait problem and myalgias.  Skin: Negative.   Neurological: Negative for dizziness, weakness and light-headedness.  Psychiatric/Behavioral: Negative.       Objective:   Physical Exam  Constitutional: She is oriented to person, place, and time. She appears well-developed and well-nourished.  Overweight  HENT:  Head: Normocephalic and atraumatic.  Eyes: EOM are normal.  Neck: Normal range of motion.  Cardiovascular: Normal rate and regular rhythm.   Pulmonary/Chest: Effort normal and breath sounds normal. No respiratory distress. She has no wheezes. She has no rales.  Abdominal: Soft. Bowel sounds are normal. She exhibits no distension. There is no tenderness. There is no rebound.  Neurological: She is alert  and oriented to person, place, and time. Coordination normal.  Skin: Skin is warm and dry.  See foot exam   Filed Vitals:   02/07/14 1239  BP: 122/66  Pulse: 70  Temp: 98.1 F (36.7 C)  TempSrc: Oral  Resp: 14  Height: 5' 1"$  (1.549 m)  Weight: 176 lb 6.4 oz (80.015 kg)  SpO2: 98%      Assessment & Plan:

## 2014-02-11 ENCOUNTER — Telehealth: Payer: Self-pay | Admitting: Internal Medicine

## 2014-02-11 NOTE — Telephone Encounter (Signed)
Is there something other than Januvia that the patient can take? Please advise, thanks.

## 2014-02-11 NOTE — Telephone Encounter (Signed)
Patient states copay for [name] is too high.   She is requesting something else to be sent in place.  Patient uses [facility].

## 2014-02-12 NOTE — Telephone Encounter (Signed)
There are no generic alternatives for this I have 100 mg samples if she wants to cut them in half

## 2014-02-12 NOTE — Telephone Encounter (Signed)
Pt called and stated the copay for the januvia in $408.00 pt is requesting alternative. MD is out pls advise...[name]

## 2014-02-12 NOTE — Telephone Encounter (Signed)
Notified pt with md response. Left samples for pick-up...[name]

## 2014-02-15 ENCOUNTER — Telehealth: Payer: Self-pay | Admitting: Internal Medicine

## 2014-02-15 NOTE — Telephone Encounter (Signed)
Patient called stating she needs an actual prescription for a generic alternative to Januvia. She states she currently has samples, but would like a prescription that she can afford. Pt uses [facility]. CB# [phone]

## 2014-02-15 NOTE — Telephone Encounter (Signed)
Is there something else she can try?

## 2014-02-19 MED ORDER — SAXAGLIPTIN HCL 2.5 MG PO TABS: 2.5000 mg | ORAL_TABLET | Freq: Every day | ORAL | Status: DC

## 2014-02-19 NOTE — Telephone Encounter (Signed)
Please advise, thanks.

## 2014-02-19 NOTE — Telephone Encounter (Signed)
We can try another medicine in the same class to see if it is covered better. Called saxagliptan.

## 2014-03-19 ENCOUNTER — Ambulatory Visit: Payer: Commercial Managed Care - HMO | Admitting: Pulmonary Disease

## 2014-05-14 ENCOUNTER — Other Ambulatory Visit (INDEPENDENT_AMBULATORY_CARE_PROVIDER_SITE_OTHER): Payer: Commercial Managed Care - HMO

## 2014-05-14 ENCOUNTER — Encounter: Payer: Self-pay | Admitting: Internal Medicine

## 2014-05-14 ENCOUNTER — Ambulatory Visit (INDEPENDENT_AMBULATORY_CARE_PROVIDER_SITE_OTHER): Payer: Commercial Managed Care - HMO | Admitting: Internal Medicine

## 2014-05-14 ENCOUNTER — Other Ambulatory Visit: Payer: Self-pay | Admitting: Pulmonary Disease

## 2014-05-14 VITALS — BP 122/68 | HR 77 | Temp 98.2°F | Resp 16 | Wt 165.8 lb

## 2014-05-14 DIAGNOSIS — E119 Type 2 diabetes mellitus without complications: Secondary | ICD-10-CM

## 2014-05-14 LAB — BASIC METABOLIC PANEL
BUN: 9 mg/dL (ref 6–23)
CO2: 30 mEq/L (ref 19–32)
Calcium: 9.9 mg/dL (ref 8.4–10.5)
Chloride: 105 mEq/L (ref 96–112)
Creatinine, Ser: 0.7 mg/dL (ref 0.40–1.20)
GFR: 104.28 mL/min (ref >60.00)
Glucose, Bld: 174 mg/dL — ABNORMAL HIGH (ref 70–99)
Potassium: 3.9 mEq/L (ref 3.5–5.1)
Sodium: 140 mEq/L (ref 135–145)

## 2014-05-14 LAB — HEMOGLOBIN A1C: Hgb A1c MFr Bld: 8.2 % — ABNORMAL HIGH (ref 4.6–6.5)

## 2014-05-14 LAB — MICROALBUMIN / CREATININE URINE RATIO
Creatinine,U: 133.8 mg/dL
Microalb Creat Ratio: 1.9 mg/g (ref 0.0–30.0)
Microalb, Ur: 2.5 mg/dL — ABNORMAL HIGH (ref 0.0–1.9)

## 2014-05-14 NOTE — Progress Notes (Signed)
Pre visit review using our clinic review tool, if applicable. No additional management support is needed unless otherwise documented below in the visit note. 

## 2014-05-14 NOTE — Patient Instructions (Addendum)
You are doing great with exercise! Keep up the good work!  We will check on the diabetes today down at the lab with blood and urine to make sure the diabetes hasn't affected the kidneys. We will see you back in about 6 months. If you have any problems or questions please feel free to call us back.  Diabetes, Eating Away From Home Sometimes, you might eat in a restaurant or have meals that are prepared by someone else. You can enjoy eating out. However, the portions in restaurants may be much larger than needed. Listed below are some ideas to help you choose foods that will keep your blood glucose (sugar) in better control.  TIPS FOR EATING OUT  Know your meal plan and how many carbohydrate servings you should have at each meal. You may wish to carry a copy of your meal plan in your purse or wallet. Learn the foods included in each food group.  Make a list of restaurants near you that offer healthy choices. Take a copy of the carry-out menus to see what they offer. Then, you can plan what you will order ahead of time.  Become familiar with serving sizes by practicing them at home using measuring cups and spoons. Once you learn to recognize portion sizes, you will be able to correctly estimate the amount of total carbohydrate you are allowed to eat at the restaurant. Ask for a takeout box if the portion is more than you should have. When your food comes, leave the amount you should have on the plate, and put the rest in the takeout box before you start eating.  Plan ahead if your mealtime will be different from usual. Check with your caregiver to find out how to time meals and medicine if you are taking insulin.  Avoid high-fat foods, such as fried foods, cream sauces, high-fat salad dressings, or any added butter or margarine.  Do not be afraid to ask questions. Ask your server about the portion size, cooking methods, ingredients and if items can be substituted. Restaurants do not list all available  items on the menu. You can ask for your main entree to be prepared using skim milk, oil instead of butter or margarine, and without gravy or sauces. Ask your waiter or waitress to serve salad dressings, gravy, sauces, margarine, and sour cream on the side. You can then add the amount your meal plan suggests.  Add more vegetables whenever possible.  Avoid items that are labeled "jumbo," "giant," "deluxe," or "supersized."  You may want to split an entre with someone and order an extra side salad.  Watch for hidden calories in foods like croutons, bacon, or cheese.  Ask your server to take away the bread basket or chips from your table.  Order a dinner salad as an appetizer. You can eat most foods served in a restaurant. Some foods are better choices than others. Breads and Starches  Recommended: All kinds of bread (wheat, rye, white, oatmeal, [place], [place], raisin), hard or soft dinner rolls, frankfurter or hamburger buns, small bagels, small corn or whole-wheat flour tortillas.  Avoid: Frosted or glazed breads, butter rolls, egg or cheese breads, croissants, sweet rolls, pastries, coffee cake, glazed or frosted doughnuts, muffins. Crackers  Recommended: Animal crackers, graham, rye, saltine, oyster, and matzoth crackers. Bread sticks, melba toast, rusks, pretzels, popcorn (without fat), zwieback toast.  Avoid: High-fat snack crackers or chips. Buttered popcorn. Cereals  Recommended: Hot and cold cereals. Whole grains such as oatmeal or shredded  wheat are good choices.  Avoid: Sugar-coated or granola type cereals. Potatoes/Pasta/Rice/Beans  Recommended: Order baked, boiled, or mashed potatoes, rice or noodles without added fat, whole beans. Order gravies, butter, margarine, or sauces on the side so you can control the amount you add.  Avoid: Hash browns or fried potatoes. Potatoes, pasta, or rice prepared with cream or cheese sauce. Potato or pasta salads prepared with large  amounts of dressing. Fried beans or fried rice. Vegetables  Recommended: Order steamed, baked, boiled, or stewed vegetables without sauces or extra fat. Ask that sauce be served on the side. If vegetables are not listed on the menu, ask what is available.  Avoid: Vegetables prepared with cream, butter, or cheese sauce. Fried vegetables. Salad Bars  Recommended: Many of the vegetables at a salad bar are considered "free." Use lemon juice, vinegar, or low-calorie salad dressing (fewer than 20 calories per serving) as "free" dressings for your salad. Look for salad bar ingredients that have no added fat or sugar such as tomatoes, lettuce, cucumbers, broccoli, carrots, onions, and mushrooms.  Avoid: Prepared salads with large amounts of dressing, such as coleslaw, caesar salad, macaroni salad, bean salad, or carrot salad. Fruit  Recommended: Eat fresh fruit or fresh fruit salad without added dressing. A salad bar often offers fresh fruit choices, but canned fruit at a restaurant is usually packed in sugar or syrup.  Avoid: Sweetened canned or frozen fruits, plain or sweetened fruit juice. Fruit salads with dressing, sour cream, or sugar added to them. Meat and Meat Substitutes  Recommended: Order broiled, baked, roasted, or grilled meat, poultry, or fish. Trim off all visible fat. Do not eat the skin of poultry. The size stated on the menu is the raw weight. Meat shrinks by  in cooking (for example, 4 oz raw equals 3 oz cooked meat).  Avoid: Deep-fat fried meat, poultry, or fish. Breaded meats. Eggs  Recommended: Order soft, hard-cooked, poached, or scrambled eggs. Omelets may be okay, depending on what ingredients are added. Egg substitutes are also a good choice.  Avoid: Fried eggs, eggs prepared with cream or cheese sauce. Milk  Recommended: Order low-fat or fat-free milk according to your meal plan. Plain, nonfat yogurt or flavored yogurt with no sugar added may be used as a substitute  for milk. Soy milk may also be used.  Avoid: Milk shakes or sweetened milk beverages. Soups and Combination Foods  Recommended: Clear broth or consomm are "free" foods and may be used as an appetizer. Broth-based soups with fat removed count as a starch serving and are preferred over cream soups. Soups made with beans or split peas may be eaten but count as a starch.  Avoid: Fatty soups, soup made with cream, cheese soup. Combination foods prepared with excessive amounts of fat or with cream or cheese sauces. Desserts and Sweets  Recommended: Ask for fresh fruit. Sponge or angel food cake without icing, ice milk, no sugar added ice cream, sherbet, or frozen yogurt may fit into your meal plan occasionally.  Avoid: Pastries, puddings, pies, cakes with icing, custard, gelatin desserts. Fats and Oils  Recommended: Choose healthy fats such as olive oil, canola oil, or tub margarine, reduced fat or fat-free sour cream, cream cheese, avocado, or nuts.  Avoid: Any fats in excess of your allowed portion. Deep-fried foods or any food with a large amount of fat. Note: Ask for all fats to be served on the side, and limit your portion sizes according to your meal plan. Document Released:  03/29/2005 Document Revised: 06/21/2011 Document Reviewed: 06/26/2013 Exit[employer] Patient Information 2015 [place], [place]. This information is not intended to replace advice given to you by your health care provider. Make sure you discuss any questions you have with your health care provider.

## 2014-05-16 NOTE — Assessment & Plan Note (Signed)
Patient needs replacement for onglyza which is no longer covered and will switch to [place]. Continue metformin. Check HgA1c and encouraged her to continue with diet and exercise. On ACE-I. Up to date on eye and foot exam.

## 2014-05-16 NOTE — Progress Notes (Signed)
   Subjective:    Patient ID: Whitney [name], female    DOB: 12/02/1934, 80 y.o.   MRN: [id number]  HPI The patient is a 80 YO female who is coming in to follow up on her diabetes. She has been working on her diet and eating more healthy foods. She is down about 10 pounds since last visit. She is not as able to exercise but is working on it. She has stopped soda and sugary beverages. She has also cut out sweets. She denies hypogylcemia or hyperglycemia. She denies pain or numbness in her feet. She is also exercising more.   Review of Systems  Constitutional: Negative for fever, activity change, appetite change, fatigue and unexpected weight change.  HENT: Negative.   Eyes: Negative.   Respiratory: Negative for cough, chest tightness, shortness of breath and wheezing.   Cardiovascular: Negative for chest pain, palpitations and leg swelling.  Gastrointestinal: Negative for abdominal pain, diarrhea, constipation and abdominal distention.  Endocrine: Negative.   Musculoskeletal: Positive for arthralgias. Negative for myalgias, back pain and gait problem.  Skin: Negative.   Neurological: Negative for dizziness, weakness and light-headedness.  Psychiatric/Behavioral: Negative.       Objective:   Physical Exam  Constitutional: She is oriented to person, place, and time. She appears well-developed and well-nourished.  Overweight  HENT:  Head: Normocephalic and atraumatic.  Eyes: EOM are normal.  Neck: Normal range of motion.  Cardiovascular: Normal rate and regular rhythm.   Pulmonary/Chest: Effort normal and breath sounds normal. No respiratory distress. She has no wheezes. She has no rales.  Abdominal: Soft. Bowel sounds are normal. She exhibits no distension. There is no tenderness. There is no rebound.  Neurological: She is alert and oriented to person, place, and time. Coordination normal.  Skin: Skin is warm and dry.   Filed Vitals:   05/14/14 0928  BP: 122/68  Pulse: 77  Temp:  98.2 F (36.8 C)  TempSrc: Oral  Resp: 16  Weight: 165 lb 12.8 oz (75.206 kg)  SpO2: 96%      Assessment & Plan:

## 2014-05-20 ENCOUNTER — Other Ambulatory Visit: Payer: Self-pay | Admitting: Geriatric Medicine

## 2014-05-20 ENCOUNTER — Other Ambulatory Visit: Payer: Self-pay | Admitting: *Deleted

## 2014-05-20 MED ORDER — ASPIRIN 81 MG PO TABS: 81.0000 mg | ORAL_TABLET | Freq: Every day | ORAL | Status: DC

## 2014-05-20 MED ORDER — METFORMIN HCL 1000 MG PO TABS: 1000.0000 mg | ORAL_TABLET | Freq: Two times a day (BID) | ORAL | Status: DC

## 2014-05-20 MED ORDER — SIMVASTATIN 20 MG PO TABS: 20.0000 mg | ORAL_TABLET | Freq: Every day | ORAL | Status: DC

## 2014-05-20 MED ORDER — AMLODIPINE BESYLATE 10 MG PO TABS: ORAL_TABLET | ORAL | Status: DC

## 2014-05-20 MED ORDER — GLIMEPIRIDE 4 MG PO TABS: ORAL_TABLET | ORAL | Status: DC

## 2014-05-20 MED ORDER — LISINOPRIL-HYDROCHLOROTHIAZIDE 20-12.5 MG PO TABS: ORAL_TABLET | ORAL | Status: DC

## 2014-05-20 MED ORDER — SITAGLIPTIN PHOSPHATE 50 MG PO TABS: 50.0000 mg | ORAL_TABLET | Freq: Every day | ORAL | Status: DC

## 2014-05-20 MED ORDER — CALCIUM-VITAMIN D 500-200 MG-UNIT PO TABS: 1.0000 | ORAL_TABLET | Freq: Two times a day (BID) | ORAL | Status: DC

## 2014-05-20 MED ORDER — GLUCOSE BLOOD VI STRP: ORAL_STRIP | Status: DC

## 2014-05-20 MED ORDER — PRODIGY TWIST TOP LANCETS 28G MISC: Status: DC

## 2014-05-20 NOTE — Telephone Encounter (Signed)
Left msg on triage requesting call back. Called pt back she stated she saw md on 05/14/14 was told that her refills will be sent to [facility]. Pt called [facility] checking status on refills and was told they will need new prescriptions! Inform pt refills wasn't sent, but will send all refills to mail service...Whitney [name]

## 2014-06-04 ENCOUNTER — Ambulatory Visit (INDEPENDENT_AMBULATORY_CARE_PROVIDER_SITE_OTHER): Payer: Commercial Managed Care - HMO | Admitting: Internal Medicine

## 2014-06-04 ENCOUNTER — Encounter: Payer: Self-pay | Admitting: Internal Medicine

## 2014-06-04 VITALS — BP 122/64 | HR 78 | Temp 98.1°F | Resp 12 | Ht 61.0 in | Wt 168.1 lb

## 2014-06-04 DIAGNOSIS — M542 Cervicalgia: Secondary | ICD-10-CM

## 2014-06-04 MED ORDER — TIZANIDINE HCL 4 MG PO TABS: 4.0000 mg | ORAL_TABLET | Freq: Three times a day (TID) | ORAL | Status: DC | PRN

## 2014-06-04 NOTE — Progress Notes (Signed)
Pre visit review using our clinic review tool, if applicable. No additional management support is needed unless otherwise documented below in the visit note. 

## 2014-06-04 NOTE — Progress Notes (Signed)
   Subjective:    Patient ID: Whitney [name], female    DOB: [birth year], 80 y.o.   MRN: [id number]  HPI The patient is a 80 YO female who is coming in for acute neck pain. It started about 1-2 weeks ago. Rated 10/10 pain. It is intermittent and she thought it was going away but has gotten worse in the last 2-3 days. She has used massage and heat on it which helps but reduces the pain to about a 6/10. She has not taken any medicines for it as she was worried it could interfere with her regimen. Denies numbness or tingling in her arms. Denies problems with range of motion in her neck although it does hurt to move so she has been moving less.   Review of Systems  Constitutional: Negative for fever, activity change, appetite change, fatigue and unexpected weight change.  Respiratory: Negative.   Cardiovascular: Negative.   Gastrointestinal: Negative.   Musculoskeletal: Positive for neck pain and neck stiffness. Negative for myalgias, back pain and gait problem.  Neurological: Negative.       Objective:   Physical Exam  Constitutional: She appears well-developed and well-nourished.  HENT:  Head: Normocephalic and atraumatic.  Eyes: EOM are normal.  Neck:  ROM limited by pain but passive ROM mostly intact, no spinal tenderness. Most tenderness in the right paraspinal region and some radiation to the shoulder  Cardiovascular: Normal rate and regular rhythm.   Pulmonary/Chest: Effort normal and breath sounds normal.  Abdominal: Soft.  Neurological: No cranial nerve deficit. Coordination normal.  Skin: Skin is warm and dry.   Filed Vitals:   06/04/14 0837  BP: 122/64  Pulse: 78  Temp: 98.1 F (36.7 C)  TempSrc: Oral  Resp: 12  Height: 5\' 1"  (1.549 m)  Weight: 168 lb 1.9 oz (76.259 kg)  SpO2: 97%      Assessment & Plan:

## 2014-06-04 NOTE — Assessment & Plan Note (Signed)
Muscular in nature, no red flags. Try tizanidine 4mg  TID prn for the next 1-2 weeks. If no improvement will call. Given some stretching exercises and advised to continue with heat and massage.

## 2014-06-04 NOTE — Patient Instructions (Signed)
We have sent in a muscle relaxer called tizanidine that you can take up to 3 times per day. It may make you sleepy or drowsy and we recommend to not drive after taking it until you know how it will affect you.  The muscles should get better in the next 1-2 weeks. You can also you heat on the area which will be helpful and you can continue doing the massage to the area as well.   Once you start feeling better we recommend starting to do some exercises to help keep you from having more problems in the future.   Shoulder Exercises EXERCISES  RANGE OF MOTION (ROM) AND STRETCHING EXERCISES These exercises may help you when beginning to rehabilitate your injury. Your symptoms may resolve with or without further involvement from your physician, physical therapist or athletic trainer. While completing these exercises, remember:   Restoring tissue flexibility helps normal motion to return to the joints. This allows healthier, less painful movement and activity.  An effective stretch should be held for at least 30 seconds.  A stretch should never be painful. You should only feel a gentle lengthening or release in the stretched tissue. ROM - Pendulum  Bend at the waist so that your right / left arm falls away from your body. Support yourself with your opposite hand on a solid surface, such as a table or a countertop.  Your right / left arm should be perpendicular to the ground. If it is not perpendicular, you need to lean over farther. Relax the muscles in your right / left arm and shoulder as much as possible.  Gently sway your hips and trunk so they move your right / left arm without any use of your right / left shoulder muscles.  Progress your movements so that your right / left arm moves side to side, then forward and backward, and finally, both clockwise and counterclockwise.  Complete __________ repetitions in each direction. Many people use this exercise to relieve discomfort in their shoulder  as well as to gain range of motion. Repeat __________ times. Complete this exercise __________ times per day. STRETCH - Flexion, Standing  Stand with good posture. With an underhand grip on your right / left hand and an overhand grip on the opposite hand, grasp a broomstick or cane so that your hands are a little more than shoulder-width apart.  Keeping your right / left elbow straight and shoulder muscles relaxed, push the stick with your opposite hand to raise your right / left arm in front of your body and then overhead. Raise your arm until you feel a stretch in your right / left shoulder, but before you have increased shoulder pain.  Try to avoid shrugging your right / left shoulder as your arm rises by keeping your shoulder blade tucked down and toward your mid-back spine. Hold __________ seconds.  Slowly return to the starting position. Repeat __________ times. Complete this exercise __________ times per day. STRETCH - Internal Rotation  Place your right / left hand behind your back, palm-up.  Throw a towel or belt over your opposite shoulder. Grasp the towel/belt with your right / left hand.  While keeping an upright posture, gently pull up on the towel/belt until you feel a stretch in the front of your right / left shoulder.  Avoid shrugging your right / left shoulder as your arm rises by keeping your shoulder blade tucked down and toward your mid-back spine.  Hold __________. Release the stretch by lowering  your opposite hand. Repeat __________ times. Complete this exercise __________ times per day. STRETCH - External Rotation and Abduction  Stagger your stance through a doorframe. It does not matter which foot is forward.  As instructed by your physician, physical therapist or athletic trainer, place your hands:  And forearms above your head and on the door frame.  And forearms at head-height and on the door frame.  At elbow-height and on the door frame.  Keeping your  head and chest upright and your stomach muscles tight to prevent over-extending your low-back, slowly shift your weight onto your front foot until you feel a stretch across your chest and/or in the front of your shoulders.  Hold __________ seconds. Shift your weight to your back foot to release the stretch. Repeat __________ times. Complete this stretch __________ times per day.  STRENGTHENING EXERCISES  These exercises may help you when beginning to rehabilitate your injury. They may resolve your symptoms with or without further involvement from your physician, physical therapist or athletic trainer. While completing these exercises, remember:   Muscles can gain both the endurance and the strength needed for everyday activities through controlled exercises.  Complete these exercises as instructed by your physician, physical therapist or athletic trainer. Progress the resistance and repetitions only as guided.  You may experience muscle soreness or fatigue, but the pain or discomfort you are trying to eliminate should never worsen during these exercises. If this pain does worsen, stop and make certain you are following the directions exactly. If the pain is still present after adjustments, discontinue the exercise until you can discuss the trouble with your clinician.  If advised by your physician, during your recovery, avoid activity or exercises which involve actions that place your right / left hand or elbow above your head or behind your back or head. These positions stress the tissues which are trying to heal. STRENGTH - Scapular Depression and Adduction  With good posture, sit on a firm chair. Supported your arms in front of you with pillows, arm rests or a table top. Have your elbows in line with the sides of your body.  Gently draw your shoulder blades down and toward your mid-back spine. Gradually increase the tension without tensing the muscles along the top of your shoulders and the back  of your neck.  Hold for __________ seconds. Slowly release the tension and relax your muscles completely before completing the next repetition.  After you have practiced this exercise, remove the arm support and complete it in standing as well as sitting. Repeat __________ times. Complete this exercise __________ times per day.  STRENGTH - External Rotators  Secure a rubber exercise band/tubing to a fixed object so that it is at the same height as your right / left elbow when you are standing or sitting on a firm surface.  Stand or sit so that the secured exercise band/tubing is at your side that is not injured.  Bend your elbow 90 degrees. Place a folded towel or small pillow under your right / left arm so that your elbow is a few inches away from your side.  Keeping the tension on the exercise band/tubing, pull it away from your body, as if pivoting on your elbow. Be sure to keep your body steady so that the movement is only coming from your shoulder rotating.  Hold __________ seconds. Release the tension in a controlled manner as you return to the starting position. Repeat __________ times. Complete this exercise __________ times per  day.  STRENGTH - Supraspinatus  Stand or sit with good posture. Grasp a __________ weight or an exercise band/tubing so that your hand is "thumbs-up," like when you shake hands.  Slowly lift your right / left hand from your thigh into the air, traveling about 30 degrees from straight out at your side. Lift your hand to shoulder height or as far as you can without increasing any shoulder pain. Initially, many people do not lift their hands above shoulder height.  Avoid shrugging your right / left shoulder as your arm rises by keeping your shoulder blade tucked down and toward your mid-back spine.  Hold for __________ seconds. Control the descent of your hand as you slowly return to your starting position. Repeat __________ times. Complete this exercise  __________ times per day.  STRENGTH - Shoulder Extensors  Secure a rubber exercise band/tubing so that it is at the height of your shoulders when you are either standing or sitting on a firm arm-less chair.  With a thumbs-up grip, grasp an end of the band/tubing in each hand. Straighten your elbows and lift your hands straight in front of you at shoulder height. Step back away from the secured end of band/tubing until it becomes tense.  Squeezing your shoulder blades together, pull your hands down to the sides of your thighs. Do not allow your hands to go behind you.  Hold for __________ seconds. Slowly ease the tension on the band/tubing as you reverse the directions and return to the starting position. Repeat __________ times. Complete this exercise __________ times per day.  STRENGTH - Scapular Retractors  Secure a rubber exercise band/tubing so that it is at the height of your shoulders when you are either standing or sitting on a firm arm-less chair.  With a palm-down grip, grasp an end of the band/tubing in each hand. Straighten your elbows and lift your hands straight in front of you at shoulder height. Step back away from the secured end of band/tubing until it becomes tense.  Squeezing your shoulder blades together, draw your elbows back as you bend them. Keep your upper arm lifted away from your body throughout the exercise.  Hold __________ seconds. Slowly ease the tension on the band/tubing as you reverse the directions and return to the starting position. Repeat __________ times. Complete this exercise __________ times per day. STRENGTH - Scapular Depressors  Find a sturdy chair without wheels, such as a from a dining room table.  Keeping your feet on the floor, lift your bottom from the seat and lock your elbows.  Keeping your elbows straight, allow gravity to pull your body weight down. Your shoulders will rise toward your ears.  Raise your body against gravity by drawing  your shoulder blades down your back, shortening the distance between your shoulders and ears. Although your feet should always maintain contact with the floor, your feet should progressively support less body weight as you get stronger.  Hold __________ seconds. In a controlled and slow manner, lower your body weight to begin the next repetition. Repeat __________ times. Complete this exercise __________ times per day.  Document Released: 02/10/2005 Document Revised: 06/21/2011 Document Reviewed: 07/11/2008 [facility] Patient Information 2015 [place], [place]. This information is not intended to replace advice given to you by your health care provider. Make sure you discuss any questions you have with your health care provider.

## 2014-07-22 ENCOUNTER — Telehealth: Payer: Self-pay | Admitting: Internal Medicine

## 2014-07-22 NOTE — Telephone Encounter (Signed)
Pt request to speak to the assistant or Dr. Doug Sou concern about glucose level. Please call pt

## 2014-07-22 NOTE — Telephone Encounter (Signed)
Patient's sugar has been running high. She wants to know if she needs an office visit, or if you can adjust her medications over the phone. It was 204 this morning fasting, and 233 around lunch time. It went down to 141 at 2:30 this afternoon. Please advise, thanks.

## 2014-07-23 ENCOUNTER — Other Ambulatory Visit: Payer: Self-pay | Admitting: Geriatric Medicine

## 2014-07-23 MED ORDER — SITAGLIPTIN PHOSPHATE 100 MG PO TABS: 100.0000 mg | ORAL_TABLET | Freq: Every day | ORAL | Status: DC

## 2014-07-23 NOTE — Telephone Encounter (Signed)
Spoke with patient and informed her of medication changes. She understands and will start taking 100 mg daily.

## 2014-07-23 NOTE — Telephone Encounter (Signed)
Will have her increase her januvia to 100 mg daily (2 pills). When she runs out I have sent the stronger prescription to her [facility] and she can then go back to taking 1 pill (100 mg). Be sure for her to watch her diet and not eat things that she shouldn't. Drink plenty of water (4-6 glasses per day) to help the sugars decrease.

## 2014-08-30 ENCOUNTER — Ambulatory Visit (INDEPENDENT_AMBULATORY_CARE_PROVIDER_SITE_OTHER): Payer: Commercial Managed Care - HMO

## 2014-08-30 VITALS — BP 130/80 | Ht 61.0 in | Wt 168.0 lb

## 2014-08-30 DIAGNOSIS — Z0000 Encounter for general adult medical examination without abnormal findings: Secondary | ICD-10-CM | POA: Diagnosis not present

## 2014-08-30 NOTE — Progress Notes (Signed)
Subjective:   Whitney [name] is a 80 y.o. female who presents for Medicare Annual (Subsequent) preventive examination.  Review of Systems:  HRA assessment completed during visit  Health better, the same or worse than last year? The same  Health described as excellent; very good; good; fair or poor? Good  Barriers; no  Current Exercise: Usually in am; TV; From [facility]; [address] [employer] 8am - 30 to 38min; 5 days a week; weights for toning and strengthening [employer]; Treadmill in am; work out in fitness room Massage bed x 15  Dietician referral; (A1c up x one point at 8.2) Goal is 7; BS running 146; Trending up and this bothers her [employer];d Januvia to 100 on 4/12 Diet: Exercise first and then eat; takes BS before she does anything. Eat; egg omelet; coffee; cereal;  Plan Educate on protein; states she is worried about portion; will add protein either in yogurt, nuts; peanut butter with breakfast;  May go to [facility] for diabetes education from [place];    1-10 Motivation; " Very motivated" 9-10   Stresses 1-10; 8 (tend to be very concerned)  The patient needs to be in very good control to feel less stressed  Vision had eye exam in this year; Q year; glaucoma under control  Dental:  Generalized Safety in the home reviewed  Falls; none in the last  Problem w knees but no falls  Gave information on safety to take home;    Current Care Team reviewed and updated      Cardiac Risk Factors include: advanced age (>52men, >62 women);diabetes mellitus;hypertension;microalbuminuria;obesity (BMI >30kg/m2)     Objective:     Vitals: BP 130/80 mmHg  Ht 5\' 1"  (1.549 m)  Wt 168 lb (76.204 kg)  BMI 31.76 kg/m2  Tobacco History  Smoking status  . Never Smoker   Smokeless tobacco  . Never Used     Counseling given: Not Answered   Past Medical History  Diagnosis Date  . Glaucoma   . Hypertension   . Chest pain   . Hypercholesteremia   . Diabetes  mellitus type 2, uncontrolled   . Obesity   . GERD (gastroesophageal reflux disease)   . Diverticulosis of colon   . DJD (degenerative joint disease) of knee   . Baker's cyst   . Lumbar spondylosis   . Disc disease, degenerative, cervical   . Osteopenia   . Facial pain, atypical   . Anxiety   . Onychomycosis    Past Surgical History  Procedure Laterality Date  . Vaginal hysterectomy  2002    Dr. Kem Kays repair  . Right eye surgery  09/2010    retina in right eye   Family History  Problem Relation Age of Onset  . Colon cancer Neg Hx   . Stomach cancer Neg Hx   . Diabetes Mother    History  Sexual Activity  . Sexual Activity: Not on file    Outpatient Encounter Prescriptions as of 08/30/2014  Medication Sig  . amLODipine (NORVASC) 10 MG tablet TAKE ONE TABLET BY MOUTH EVERY DAY  . aspirin 81 MG tablet Take 1 tablet (81 mg total) by mouth daily.  . Blood Glucose Monitoring Suppl (PRODIGY AUTOCODE BLOOD GLUCOSE) DEVI Test blood sugar as directed  . Calcium Carbonate-Vitamin D (CALCIUM-VITAMIN D) 500-200 MG-UNIT per tablet Take 1 tablet by mouth 2 (two) times daily with a meal.  . glimepiride (AMARYL) 4 MG tablet TAKE ONE TABLET EVERY DAY BEFORE BREAKFAST  .  glucose blood (PRODIGY NO CODING BLOOD GLUC) test strip Use to check blood sugars twice a day E11.9  . latanoprost (XALATAN) 0.005 % ophthalmic solution Place 1 drop into both eyes at bedtime.   Marland Kitchen lisinopril-hydrochlorothiazide (PRINZIDE,ZESTORETIC) 20-12.5 MG per tablet TAKE ONE TABLET BY MOUTH EVERY DAY  . metFORMIN (GLUCOPHAGE) 1000 MG tablet Take 1 tablet (1,000 mg total) by mouth 2 (two) times daily with a meal.  . Multiple Vitamin (MULTI VITAMIN DAILY PO) Take one by mouth daily  . PRODIGY TWIST TOP LANCETS 28G MISC Use as directed  . simvastatin (ZOCOR) 20 MG tablet Take 1 tablet (20 mg total) by mouth at bedtime.  . sitaGLIPtin (JANUVIA) 100 MG tablet Take 1 tablet (100 mg total) by mouth daily.  .  [DISCONTINUED] tiZANidine (ZANAFLEX) 4 MG tablet Take 1 tablet (4 mg total) by mouth 3 (three) times daily as needed for muscle spasms.   No facility-administered encounter medications on file as of 08/30/2014.    Activities of Daily Living In your present state of health, do you have any difficulty performing the following activities: 08/30/2014  Hearing? N  Vision? N  Difficulty concentrating or making decisions? N  Walking or climbing stairs? N  Dressing or bathing? N  Doing errands, shopping? N  Preparing Food and eating ? N  Using the Toilet? N  In the past six months, have you accidently leaked urine? N  Do you have problems with loss of bowel control? N  Managing your Medications? N  Managing your Finances? N  Housekeeping or managing your Housekeeping? N    Patient Care Team: Olga Millers, MD as PCP - General (Internal Medicine)    Assessment:    Objective:     Understand risk and lifestyle choices than can impede health:   Personalized Education given regarding: Discussed s/s of heart disease as well MI in women   Pt determined a personalized goal : Goals to assist patient to reach A1c target of 7   Assessment included: smoking (secondary) educated as appropriate; including LDCT if appropriate/ non smoker Bone density scan as appropriate/ Scan not overdue; educated on osteopenia as this is on problem list. Taking Calcium 1000mg  but 400mg  of Vit D; will review last scan and fup with PCP as indicated Taking meds without issues; no barriers identified Stress: Recommendations for managing stress if assessed as a factor; discussed exercise without eating can produce stress in her body and will try to eat a little protein prior to exercise.  No Risk for hepatitis or high risk social behavior identified via hepatitis screen  Educated on shingles and follow up with insurance company for co-pays or charges applied to Part D benefit. Will check on copay at the end of  the year.  Safety issues reviewed(driving issues; vision; home environment; support and environmental safety)  Cognition assessed by AD8; Score 0 (A score of 2 or greater would indicate the MMSE be completed)    Need for Immunizations or other screenings identified;  Due Prevnar; Declined taking today but gave the patient information; referred to [facility] site and to discuss with Dr. Doug Sou at her next apt. (CDC recommmend Prevnar at 7 followed by pnuemovax 23 in one year or 5 years after the last dose.  Health Maintenance up to date and a Preventive Wellness Plan was given to the patient    Exercise Activities and Dietary recommendations Current Exercise Habits:: Structured exercise class, Type of exercise: strength training/weights;treadmill, Time (Minutes): 45, Frequency (Times/Week): 5, Weekly  Exercise (Minutes/Week): 225, Intensity: Moderate  Goals    . Peak Blood Glucose < 180     Desires to have Goal of 7 A1c Plans to see Diabetic educator on [employer] per [place] with exercise Will a little protein to each meal and eat a small amount of protein to smoothie, yogurt prior to exercise in am Portion control      Fall Risk Fall Risk  08/30/2014 03/12/2013  Falls in the past year? No No   Depression Screen PHQ 2/9 Scores 08/30/2014 03/12/2013  PHQ - 2 Score 0 0     Cognitive Testing MMSE - Mini Mental State Exam 08/30/2014  Not completed: Unable to complete    Immunization History  Administered Date(s) Administered  . Influenza Split 02/18/2011, 02/23/2012  . Influenza Whole 01/19/2005, 02/17/2009, 02/16/2010  . Influenza,inj,Quad PF,36+ Mos 03/12/2013, 02/07/2014  . Pneumococcal Polysaccharide-23 04/12/1998, 02/16/2010  . Td 08/21/2002  . Tdap 08/23/2011   Screening Tests Health Maintenance  Topic Date Due  . ZOSTAVAX  02/06/1997  . PNA vac Low Risk Adult (2 of 2 - PCV13) 02/17/2011  . OPHTHALMOLOGY EXAM  09/11/2014  . INFLUENZA VACCINE  11/11/2014  .  HEMOGLOBIN A1C  11/12/2014  . FOOT EXAM  02/09/2015  . URINE MICROALBUMIN  05/15/2015  . TETANUS/TDAP  08/22/2021  . COLONOSCOPY  06/20/2023  . DEXA SCAN  Completed      Plan:  Plan   The patient agrees to: 1. Go to diabetic education center on [employer] per [employer]/ ? Tourist information centre manager at [employer] 2. Eat a little protein with meals; Try taking in limited protein prior to exercise in am Will check BS x 3 days and journal meals prior to going to Diabetic center;  1. fup on dexa scan and Vid D  2. Patient may call for health coaching if the a1c does not come down; Highly motivated patient; looks younger than her stated age  States EKG was done by GI but no record on chart;  Also c/o of pain and swelling in right lower leg; ? Ankle or lower calf area; went to podiatrist and given antibiotics; Swelling went away "yesterday"; Left leg warm and dry; pedal pulses good; no swelling noted; no pain; no reddness; Patient was advised to come in for an apt if this happens again.  Advanced directive:Completed; Dtrs [place] and [place];  Discuss with Doctor on next fup: Prevnar  EKG Vit D    During the course of the visit the patient was educated and counseled about the following appropriate screening and preventive services:   Vaccines to include Pneumoccal, Influenza, Hepatitis B, Td, Zostavax, HCV  Declined prevnar today, but will dicsuss with Dr. Doug Sou  Electrocardiogram; Will defer to Dr. Doug Sou; states one was completed at GI office; no chest pain or symptoms;   Educated on symptoms of MI in women who are Diabetics  Cardiovascular Disease/ reviewed/ HTN good  Colorectal cancer screening/ completed 06/2013  Bone density screening; ? Completed in 2011; not overdue per epic but did discuss re-screening   Diabetes screening; will call Dr. Doug Sou if am bs remain over 140  Glaucoma screening; tx successful; see eye doctor q year  Mammography/PAP; no schedule for 01/2015  Nutrition  counseling ; given  Patient Instructions (the written plan) was given to the patient.   Wynetta Fines, RN  08/30/2014

## 2014-08-30 NOTE — Patient Instructions (Addendum)
Whitney [name] , Thank you for taking time to come for your Medicare Wellness Visit. I appreciate your ongoing commitment to your health goals. Please review the following plan we discussed and let me know if I can assist you in the future.   These are the goals we discussed: Goals    . Peak Blood Glucose < 180     Desires to have Goal of 7 A1c Plans to see Diabetic educator on [employer] per [place] with exercise Will a little protein to each meal and eat a small amount of protein to smoothie, yogurt prior to exercise in am Portion control       This is a list of the screening recommended for you and due dates:  Health Maintenance  Topic Date Due  . Shingles Vaccine  02/06/1997  . Pneumonia vaccines (2 of 2 - PCV13) 02/17/2011  . Eye exam for diabetics  09/11/2014  . Flu Shot  11/11/2014  . Hemoglobin A1C  11/12/2014  . Complete foot exam   02/09/2015  . Urine Protein Check  05/15/2015  . Tetanus Vaccine  08/22/2021  . Colon Cancer Screening  06/20/2023  . DEXA scan (bone density measurement)  Completed   In review 1. Will speak with Dr. Doug Sou about Prevnar 13/ will give education on Prevnar today 2. Will visit the DM education center per [facility] on [facility] 3. Will increase protein with meals 4. I will check on Vit d increase and Calcium 5. Will continue to work on goal of 7 A1c 6. Portion control   Diabetes and Exercise Diabetes mellitus is a common, chronic disease, in which the pancreas is unable to adequately control blood glucose (sugar) levels. There are 2 types of diabetes. Type 1 diabetes patients are unable to produce insulin, a hormone that causes sugar in the blood to be stored in the body. People with type 1 diabetes may compensate by giving themselves injections of insulin. Type 2 diabetes involves not producing adequate amounts of insulin to control blood glucose levels. People with type 2 diabetes control their blood glucose by monitoring their  food intake or by taking medicine. Exercise is an important part of diabetes treatment. During exercise, the muscles use a greater amount of glucose from the blood for energy. This lowers your blood glucose, which is the same effect you would get from taking insulin. It has been shown that endurance athletes are more sensitive to insulin than inactive people. SYMPTOMS  Many people with a mild case of diabetes have no symptoms. However, if left uncontrolled, diabetes can lead to several complications that could be prevented with treatment of the disease. General symptoms of diabetes include:   Frequent urination (polyuria).  Frequent thirst and drinking (polydipsia).  Increased food consumption (polyphagia).  Fatigue.  Poor exercise performance.  Blurred vision.  Inflammation of the vagina (vaginitis) caused by fungal infections.  Skin infections (uncommon).  Numbness in the feet, caused by nerve injury.  Kidney disease. CAUSES  The cause of most cases of diabetes is unknown. In children, diabetes is often due to an autoimmune response to the cells in the pancreas that make insulin. It is also linked with other diseases, such as cystic fibrosis. Diabetes may have a genetic link. PREVENTION  Athletes should strive to begin exercise with blood glucose in a well-controlled state.  Feet should always be kept clean and dry.  Activities in which low blood sugar levels cannot be treated easily (scuba diving, rock climbing, swimming) should  be avoided.  Anticipate alterations in diet or training to avoid low blood sugar (hypoglycemia) and high blood sugar (hyperglycemia).  Athletes should try to increase sugar consumption after strenuous exercise to avoid hypoglycemia.  Short-acting insulin should not be injected into an actively exercising muscle. The athlete should rest the injection site for about 1 hour after exercise.  Patients with diabetes should get routine checkups of the feet  to prevent complications. PROGNOSIS  Exercise provides many benefits to the person with diabetes:   Reduced body fat.  Lower blood pressure.  Often, reduced need for medicines.  Improved exercise tolerance.  Lower insulin levels.  Weight loss.  Improved lipid profile (decreased cholesterol and low-density lipoproteins). RELATED COMPLICATIONS  If performed incorrectly, exercise can result in complications of diabetes:   Poor control of blood sugar, when exercise is performed at the wrong time.  Increase in renal disease, from loss of body fluids (dehydration).  Increased risk of nerve injury (neuropathy) when performing exercises that increase foot injury.  Increased risk of eye problems when performing activities that involve breath holding or lowering or jarring the head.  Increased risk of sudden death from exercise in patients with heart disease.  Worsening of hypertension with heavy lifting (more than 10 lb/4.5 kg). Altered blood glucose and insulin dose as a result of mild illness that produces loss of appetite.  Altered uptake of insulin after injection when insulin injection site is changed. NOTE: Exercise can lower blood glucose effectively, but the effects are short-lasting (no more than a couple of days). Exercise has been shown to improve your sensitivity to insulin. This may alter how your body responds to a given dose of injected insulin. It is important for every patient with diabetes to know how his or her body may react to exercise, and to adjust insulin dosages accordingly. TREATMENT  Eat about 1 to 3 hours before exercise.  Check blood glucose immediately before and after exercise.  Stop exercise if blood glucose is more than 250 mg/dL.  Stop exercise if blood glucose is less than 100 mg/dL.  Do not exercise within 1 hour of an insulin injection.  Be prepared to treat low blood glucose while exercising. Keep some sugar product with you, such as a candy  bar.  For prolonged exercise, use a sports drink to maintain your glucose level.  Replace used-up glucose in the body after exercise.  Consume fluids during and after exercise to avoid dehydration. SEEK MEDICAL CARE IF:  You have vision changes after a run.  You notice a loss of sensation in your feet after exercise.  You have increased numbness, tingling, or pins and needles sensations after exercise.  You have chest pain during or after exercise.  You have a fast, irregular heartbeat (palpitations) during or after exercise.  Your exercise tolerance gets worse.  You have fainting or dizzy spells for brief periods during or after exercise. Document Released: 03/29/2005 Document Revised: 08/13/2013 Document Reviewed: 07/11/2008 [facility] Patient Information 2015 [place], [place]. This information is not intended to replace advice given to you by your health care provider. Make sure you discuss any questions you have with your health care provider. Osteoporosis Throughout your life, your body breaks down old bone and replaces it with new bone. As you get older, your body does not replace bone as quickly as it breaks it down. By the age of 34 years, most people begin to gradually lose bone because of the imbalance between bone loss and replacement. Some people  lose more bone than others. Bone loss beyond a specified normal degree is considered osteoporosis.  Osteoporosis affects the strength and durability of your bones. The inside of the ends of your bones and your flat bones, like the bones of your pelvis, look like honeycomb, filled with tiny open spaces. As bone loss occurs, your bones become less dense. This means that the open spaces inside your bones become bigger and the walls between these spaces become thinner. This makes your bones weaker. Bones of a person with osteoporosis can become so weak that they can break (fracture) during minor accidents, such as a simple fall. CAUSES  The  following factors have been associated with the development of osteoporosis:  Smoking.  Drinking more than 2 alcoholic drinks several days per week.  Long-term use of certain medicines:  Corticosteroids.  Chemotherapy medicines.  Thyroid medicines.  Antiepileptic medicines.  Gonadal hormone suppression medicine.  Immunosuppression medicine.  Being underweight.  Lack of physical activity.  Lack of exposure to the sun. This can lead to vitamin D deficiency.  Certain medical conditions:  Certain inflammatory bowel diseases, such as Crohn disease and ulcerative colitis.  Diabetes.  Hyperthyroidism.  Hyperparathyroidism. RISK FACTORS Anyone can develop osteoporosis. However, the following factors can increase your risk of developing osteoporosis:  Gender--Women are at higher risk than men.  Age--Being older than 50 years increases your risk.  Ethnicity--White and Asian people have an increased risk.  Weight --Being extremely underweight can increase your risk of osteoporosis.  Family history of osteoporosis--Having a family member who has developed osteoporosis can increase your risk. SYMPTOMS  Usually, people with osteoporosis have no symptoms.  DIAGNOSIS  Signs during a physical exam that may prompt your caregiver to suspect osteoporosis include:  Decreased height. This is usually caused by the compression of the bones that form your spine (vertebrae) because they have weakened and become fractured.  A curving or rounding of the upper back (kyphosis). To confirm signs of osteoporosis, your caregiver may request a procedure that uses 2 low-dose X-ray beams with different levels of energy to measure your bone mineral density (dual-energy X-ray absorptiometry [DXA]). Also, your caregiver may check your level of vitamin D. TREATMENT  The goal of osteoporosis treatment is to strengthen bones in order to decrease the risk of bone fractures. There are different types of  medicines available to help achieve this goal. Some of these medicines work by slowing the processes of bone loss. Some medicines work by increasing bone density. Treatment also involves making sure that your levels of calcium and vitamin D are adequate. PREVENTION  There are things you can do to help prevent osteoporosis. Adequate intake of calcium and vitamin D can help you achieve optimal bone mineral density. Regular exercise can also help, especially resistance and weight-bearing activities. If you smoke, quitting smoking is an important part of osteoporosis prevention. MAKE SURE YOU:  Understand these instructions.  Will watch your condition.  Will get help right away if you are not doing well or get worse. FOR MORE INFORMATION www.osteo.org and [email or web address].com.ee Document Released: 01/06/2005 Document Revised: 07/24/2012 Document Reviewed: 03/13/2011 [facility] Patient Information 2015 [place], [place]. This information is not intended to replace advice given to you by your health care provider. Make sure you discuss any questions you have with your health care provider.  Health Maintenance Adopting a healthy lifestyle and getting preventive care can go a long way to promote health and wellness. Talk with your health care provider about what schedule of  regular examinations is right for you. This is a good chance for you to check in with your provider about disease prevention and staying healthy. In between checkups, there are plenty of things you can do on your own. Experts have done a lot of research about which lifestyle changes and preventive measures are most likely to keep you healthy. Ask your health care provider for more information. WEIGHT AND DIET  Eat a healthy diet  Be sure to include plenty of vegetables, fruits, low-fat dairy products, and lean protein.  Do not eat a lot of foods high in solid fats, added sugars, or salt.  Get regular exercise. This is one of the most important  things you can do for your health.  Most adults should exercise for at least 150 minutes each week. The exercise should increase your heart rate and make you sweat (moderate-intensity exercise).  Most adults should also do strengthening exercises at least twice a week. This is in addition to the moderate-intensity exercise.  Maintain a healthy weight  Body mass index (BMI) is a measurement that can be used to identify possible weight problems. It estimates body fat based on height and weight. Your health care provider can help determine your BMI and help you achieve or maintain a healthy weight.  For females 32 years of age and older:   A BMI below 18.5 is considered underweight.  A BMI of 18.5 to 24.9 is normal.  A BMI of 25 to 29.9 is considered overweight.  A BMI of 30 and above is considered obese.  Watch levels of cholesterol and blood lipids  You should start having your blood tested for lipids and cholesterol at 79 years of age, then have this test every 5 years.  You may need to have your cholesterol levels checked more often if:  Your lipid or cholesterol levels are high.  You are older than 78 years of age.  You are at high risk for heart disease.  CANCER SCREENING   Lung Cancer  Lung cancer screening is recommended for adults 64-59 years old who are at high risk for lung cancer because of a history of smoking.  A yearly low-dose CT scan of the lungs is recommended for people who:  Currently smoke.  Have quit within the past 15 years.  Have at least a 30-pack-year history of smoking. A pack year is smoking an average of one pack of cigarettes a day for 1 year.  Yearly screening should continue until it has been 15 years since you quit.  Yearly screening should stop if you develop a health problem that would prevent you from having lung cancer treatment.  Breast Cancer  Practice breast self-awareness. This means understanding how your breasts normally  appear and feel.  It also means doing regular breast self-exams. Let your health care provider know about any changes, no matter how small.  If you are in your 20s or 30s, you should have a clinical breast exam (CBE) by a health care provider every 1-3 years as part of a regular health exam.  If you are 30 or older, have a CBE every year. Also consider having a breast X-ray (mammogram) every year.  If you have a family history of breast cancer, talk to your health care provider about genetic screening.  If you are at high risk for breast cancer, talk to your health care provider about having an MRI and a mammogram every year.  Breast cancer gene (BRCA) assessment is recommended  for women who have family members with BRCA-related cancers. BRCA-related cancers include:  Breast.  Ovarian.  Tubal.  Peritoneal cancers.  Results of the assessment will determine the need for genetic counseling and BRCA1 and BRCA2 testing. Cervical Cancer Routine pelvic examinations to screen for cervical cancer are no longer recommended for nonpregnant women who are considered low risk for cancer of the pelvic organs (ovaries, uterus, and vagina) and who do not have symptoms. A pelvic examination may be necessary if you have symptoms including those associated with pelvic infections. Ask your health care provider if a screening pelvic exam is right for you.   The Pap test is the screening test for cervical cancer for women who are considered at risk.  If you had a hysterectomy for a problem that was not cancer or a condition that could lead to cancer, then you no longer need Pap tests.  If you are older than 65 years, and you have had normal Pap tests for the past 10 years, you no longer need to have Pap tests.  If you have had past treatment for cervical cancer or a condition that could lead to cancer, you need Pap tests and screening for cancer for at least 20 years after your treatment.  If you no  longer get a Pap test, assess your risk factors if they change (such as having a new sexual partner). This can affect whether you should start being screened again.  Some women have medical problems that increase their chance of getting cervical cancer. If this is the case for you, your health care provider may recommend more frequent screening and Pap tests.  The human papillomavirus (HPV) test is another test that may be used for cervical cancer screening. The HPV test looks for the virus that can cause cell changes in the cervix. The cells collected during the Pap test can be tested for HPV.  The HPV test can be used to screen women 67 years of age and older. Getting tested for HPV can extend the interval between normal Pap tests from three to five years.  An HPV test also should be used to screen women of any age who have unclear Pap test results.  After 78 years of age, women should have HPV testing as often as Pap tests.  Colorectal Cancer  This type of cancer can be detected and often prevented.  Routine colorectal cancer screening usually begins at 78 years of age and continues through 78 years of age.  Your health care provider may recommend screening at an earlier age if you have risk factors for colon cancer.  Your health care provider may also recommend using home test kits to check for hidden blood in the stool.  A small camera at the end of a tube can be used to examine your colon directly (sigmoidoscopy or colonoscopy). This is done to check for the earliest forms of colorectal cancer.  Routine screening usually begins at age 23.  Direct examination of the colon should be repeated every 5-10 years through 78 years of age. However, you may need to be screened more often if early forms of precancerous polyps or small growths are found. Skin Cancer  Check your skin from head to toe regularly.  Tell your health care provider about any new moles or changes in moles,  especially if there is a change in a mole's shape or color.  Also tell your health care provider if you have a mole that is larger than  the size of a pencil eraser.  Always use sunscreen. Apply sunscreen liberally and repeatedly throughout the day.  Protect yourself by wearing long sleeves, pants, a wide-brimmed hat, and sunglasses whenever you are outside. HEART DISEASE, DIABETES, AND HIGH BLOOD PRESSURE   Have your blood pressure checked at least every 1-2 years. High blood pressure causes heart disease and increases the risk of stroke.  If you are between 48 years and 86 years old, ask your health care provider if you should take aspirin to prevent strokes.  Have regular diabetes screenings. This involves taking a blood sample to check your fasting blood sugar level.  If you are at a normal weight and have a low risk for diabetes, have this test once every three years after 78 years of age.  If you are overweight and have a high risk for diabetes, consider being tested at a younger age or more often. PREVENTING INFECTION  Hepatitis B  If you have a higher risk for hepatitis B, you should be screened for this virus. You are considered at high risk for hepatitis B if:  You were born in a country where hepatitis B is common. Ask your health care provider which countries are considered high risk.  Your parents were born in a high-risk country, and you have not been immunized against hepatitis B (hepatitis B vaccine).  You have HIV or AIDS.  You use needles to inject street drugs.  You live with someone who has hepatitis B.  You have had sex with someone who has hepatitis B.  You get hemodialysis treatment.  You take certain medicines for conditions, including cancer, organ transplantation, and autoimmune conditions. Hepatitis C  Blood testing is recommended for:  Everyone born from [birth year] through 1963.  Anyone with known risk factors for hepatitis C. Sexually transmitted  infections (STIs)  You should be screened for sexually transmitted infections (STIs) including gonorrhea and chlamydia if:  You are sexually active and are younger than 78 years of age.  You are older than 78 years of age and your health care provider tells you that you are at risk for this type of infection.  Your sexual activity has changed since you were last screened and you are at an increased risk for chlamydia or gonorrhea. Ask your health care provider if you are at risk.  If you do not have HIV, but are at risk, it may be recommended that you take a prescription medicine daily to prevent HIV infection. This is called pre-exposure prophylaxis (PrEP). You are considered at risk if:  You are sexually active and do not regularly use condoms or know the HIV status of your partner(s).  You take drugs by injection.  You are sexually active with a partner who has HIV. Talk with your health care provider about whether you are at high risk of being infected with HIV. If you choose to begin PrEP, you should first be tested for HIV. You should then be tested every 3 months for as long as you are taking PrEP.  PREGNANCY   If you are premenopausal and you may become pregnant, ask your health care provider about preconception counseling.  If you may become pregnant, take 400 to 800 micrograms (mcg) of folic acid every day.  If you want to prevent pregnancy, talk to your health care provider about birth control (contraception). OSTEOPOROSIS AND MENOPAUSE   Osteoporosis is a disease in which the bones lose minerals and strength with aging. This can result in  serious bone fractures. Your risk for osteoporosis can be identified using a bone density scan.  If you are 2 years of age or older, or if you are at risk for osteoporosis and fractures, ask your health care provider if you should be screened.  Ask your health care provider whether you should take a calcium or vitamin D supplement to  lower your risk for osteoporosis.  Menopause may have certain physical symptoms and risks.  Hormone replacement therapy may reduce some of these symptoms and risks. Talk to your health care provider about whether hormone replacement therapy is right for you.  HOME CARE INSTRUCTIONS   Schedule regular health, dental, and eye exams.  Stay current with your immunizations.   Do not use any tobacco products including cigarettes, chewing tobacco, or electronic cigarettes.  If you are pregnant, do not drink alcohol.  If you are breastfeeding, limit how much and how often you drink alcohol.  Limit alcohol intake to no more than 1 drink per day for nonpregnant women. One drink equals 12 ounces of beer, 5 ounces of wine, or 1 ounces of hard liquor.  Do not use street drugs.  Do not share needles.  Ask your health care provider for help if you need support or information about quitting drugs.  Tell your health care provider if you often feel depressed.  Tell your health care provider if you have ever been abused or do not feel safe at home. Document Released: 10/12/2010 Document Revised: 08/13/2013 Document Reviewed: 02/28/2013 [facility] Patient Information 2015 [place], [place]. This information is not intended to replace advice given to you by your health care provider. Make sure you discuss any questions you have with your health care provider.

## 2014-08-30 NOTE — Progress Notes (Signed)
Medical screening examination/treatment/procedure(s) were performed by non-physician practitioner and as supervising physician I was immediately available for consultation/collaboration. I agree with above. Kollar, Sahiti Joswick A, MD   

## 2014-09-23 ENCOUNTER — Telehealth: Payer: Self-pay | Admitting: Internal Medicine

## 2014-09-23 NOTE — Telephone Encounter (Signed)
Patient is requesting a referral to Dr. Katy Fitch for annual/ diabetic eye exam.  Patient has appointment 6/14 at 2:00pm.

## 2014-09-23 NOTE — Telephone Encounter (Signed)
[employer] auth submitted. Awaiting approval.

## 2014-09-24 LAB — HM DIABETES EYE EXAM

## 2014-09-24 NOTE — Telephone Encounter (Signed)
[employer] Josem Kaufmann #[id number] valid 09/23/2014 - 03/22/2015 for 6 visits

## 2014-09-26 ENCOUNTER — Other Ambulatory Visit: Payer: Self-pay | Admitting: Pulmonary Disease

## 2014-11-12 ENCOUNTER — Other Ambulatory Visit (INDEPENDENT_AMBULATORY_CARE_PROVIDER_SITE_OTHER): Payer: Commercial Managed Care - HMO

## 2014-11-12 ENCOUNTER — Ambulatory Visit (INDEPENDENT_AMBULATORY_CARE_PROVIDER_SITE_OTHER): Payer: Commercial Managed Care - HMO | Admitting: Internal Medicine

## 2014-11-12 ENCOUNTER — Encounter: Payer: Self-pay | Admitting: Internal Medicine

## 2014-11-12 VITALS — BP 132/70 | HR 67 | Temp 98.2°F | Resp 12 | Ht 61.0 in | Wt 165.0 lb

## 2014-11-12 DIAGNOSIS — E1165 Type 2 diabetes mellitus with hyperglycemia: Secondary | ICD-10-CM | POA: Diagnosis not present

## 2014-11-12 DIAGNOSIS — I10 Essential (primary) hypertension: Secondary | ICD-10-CM

## 2014-11-12 DIAGNOSIS — E669 Obesity, unspecified: Secondary | ICD-10-CM

## 2014-11-12 DIAGNOSIS — IMO0001 Reserved for inherently not codable concepts without codable children: Secondary | ICD-10-CM

## 2014-11-12 LAB — BASIC METABOLIC PANEL
BUN: 12 mg/dL (ref 6–23)
CO2: 31 mEq/L (ref 19–32)
Calcium: 10.2 mg/dL (ref 8.4–10.5)
Chloride: 102 mEq/L (ref 96–112)
Creatinine, Ser: 0.76 mg/dL (ref 0.40–1.20)
GFR: 94.71 mL/min (ref >60.00)
Glucose, Bld: 170 mg/dL — ABNORMAL HIGH (ref 70–99)
Potassium: 4.1 mEq/L (ref 3.5–5.1)
Sodium: 139 mEq/L (ref 135–145)

## 2014-11-12 LAB — HEMOGLOBIN A1C: Hgb A1c MFr Bld: 7.7 % — ABNORMAL HIGH (ref 4.6–6.5)

## 2014-11-12 MED ORDER — METFORMIN HCL 1000 MG PO TABS: 1000.0000 mg | ORAL_TABLET | Freq: Two times a day (BID) | ORAL | Status: DC

## 2014-11-12 MED ORDER — AMLODIPINE BESYLATE 10 MG PO TABS: ORAL_TABLET | ORAL | Status: DC

## 2014-11-12 MED ORDER — LISINOPRIL-HYDROCHLOROTHIAZIDE 20-12.5 MG PO TABS: ORAL_TABLET | ORAL | Status: DC

## 2014-11-12 MED ORDER — SITAGLIPTIN PHOSPHATE 100 MG PO TABS: 100.0000 mg | ORAL_TABLET | Freq: Every day | ORAL | Status: DC

## 2014-11-12 MED ORDER — GLIMEPIRIDE 4 MG PO TABS: ORAL_TABLET | ORAL | Status: DC

## 2014-11-12 MED ORDER — SIMVASTATIN 20 MG PO TABS: 20.0000 mg | ORAL_TABLET | Freq: Every day | ORAL | Status: DC

## 2014-11-12 MED ORDER — GLUCOSE BLOOD VI STRP: ORAL_STRIP | Status: DC

## 2014-11-12 NOTE — Assessment & Plan Note (Signed)
Controlled on amlodipine, lisinopril/hctz. Check BMP and adjust regimen if needed. BP at goal today.

## 2014-11-12 NOTE — Patient Instructions (Signed)
We will have you try the diabetes education for nutrition. We are also checking the blood work today and will call you back with the results.   Diabetes and Exercise Exercising regularly is important. It is not just about losing weight. It has many health benefits, such as:  Improving your overall fitness, flexibility, and endurance.  Increasing your bone density.  Helping with weight control.  Decreasing your body fat.  Increasing your muscle strength.  Reducing stress and tension.  Improving your overall health. People with diabetes who exercise gain additional benefits because exercise:  Reduces appetite.  Improves the body's use of blood sugar (glucose).  Helps lower or control blood glucose.  Decreases blood pressure.  Helps control blood lipids (such as cholesterol and triglycerides).  Improves the body's use of the hormone insulin by:  Increasing the body's insulin sensitivity.  Reducing the body's insulin needs.  Decreases the risk for heart disease because exercising:  Lowers cholesterol and triglycerides levels.  Increases the levels of good cholesterol (such as high-density lipoproteins [HDL]) in the body.  Lowers blood glucose levels. YOUR ACTIVITY PLAN  Choose an activity that you enjoy and set realistic goals. Your health care provider or diabetes educator can help you make an activity plan that works for you. Exercise regularly as directed by your health care provider. This includes:  Performing resistance training twice a week such as push-ups, sit-ups, lifting weights, or using resistance bands.  Performing 150 minutes of cardio exercises each week such as walking, running, or playing sports.  Staying active and spending no more than 90 minutes at one time being inactive. Even short bursts of exercise are good for you. Three 10-minute sessions spread throughout the day are just as beneficial as a single 30-minute session. Some exercise ideas  include:  Taking the dog for a walk.  Taking the stairs instead of the elevator.  Dancing to your favorite song.  Doing an exercise video.  Doing your favorite exercise with a friend. RECOMMENDATIONS FOR EXERCISING WITH TYPE 1 OR TYPE 2 DIABETES   Check your blood glucose before exercising. If blood glucose levels are greater than 240 mg/dL, check for urine ketones. Do not exercise if ketones are present.  Avoid injecting insulin into areas of the body that are going to be exercised. For example, avoid injecting insulin into:  The arms when playing tennis.  The legs when jogging.  Keep a record of:  Food intake before and after you exercise.  Expected peak times of insulin action.  Blood glucose levels before and after you exercise.  The type and amount of exercise you have done.  Review your records with your health care provider. Your health care provider will help you to develop guidelines for adjusting food intake and insulin amounts before and after exercising.  If you take insulin or oral hypoglycemic agents, watch for signs and symptoms of hypoglycemia. They include:  Dizziness.  Shaking.  Sweating.  Chills.  Confusion.  Drink plenty of water while you exercise to prevent dehydration or heat stroke. Body water is lost during exercise and must be replaced.  Talk to your health care provider before starting an exercise program to make sure it is safe for you. Remember, almost any type of activity is better than none. Document Released: 06/19/2003 Document Revised: 08/13/2013 Document Reviewed: 09/05/2012 [facility] Patient Information 2015 [place], [place]. This information is not intended to replace advice given to you by your health care provider. Make sure you discuss any questions  you have with your health care provider.

## 2014-11-12 NOTE — Progress Notes (Signed)
Pre visit review using our clinic review tool, if applicable. No additional management support is needed unless otherwise documented below in the visit note. 

## 2014-11-12 NOTE — Assessment & Plan Note (Signed)
Weight stable from last visit but not decreasing, she is not exercising and talked to her about the importance of that at today's visit.

## 2014-11-12 NOTE — Assessment & Plan Note (Signed)
She is close to goal of HgA1c <8. Last HgA1c 8.2 and will recheck today and adjust regimen as needed. She is taking metformin max dose, januvia, amaryl. No hypoglycemia reported. No known complications. Kidney function fairly normal. Will refer to diabetes nutrition to see if that can help bridge her knowledge gap. Also talked to her about increasing exercise for better control.

## 2014-11-12 NOTE — Progress Notes (Signed)
   Subjective:    Patient ID: Whitney [name], female    DOB: 1934/09/18, 80 y.o.   MRN: [id number]  HPI The patient is a 80 YO female coming in for follow up of her diabetes and blood pressure. She has been noticing some high sugars lately. About 160-170 to in the morning but good during the day. She is taking her medicines well but does not feel confident about her knowledge about food. She thinks maybe she is eating too much later at night which could be affecting the morning sugars. She has been struggling with it for many years and has never had sugars this high before. She is not exercising right now.   Review of Systems  Constitutional: Negative for fever, activity change, appetite change, fatigue and unexpected weight change.  Respiratory: Negative.   Cardiovascular: Negative.   Gastrointestinal: Negative.   Musculoskeletal: Negative for myalgias, back pain and gait problem.  Neurological: Negative for dizziness, weakness, light-headedness and numbness.  Psychiatric/Behavioral: Negative.       Objective:   Physical Exam  Constitutional: She is oriented to person, place, and time. She appears well-developed and well-nourished.  Overweight  HENT:  Head: Normocephalic and atraumatic.  Eyes: EOM are normal.  Neck: Normal range of motion.  Cardiovascular: Normal rate and regular rhythm.   Pulmonary/Chest: Effort normal and breath sounds normal. No respiratory distress. She has no wheezes. She has no rales.  Abdominal: Soft. Bowel sounds are normal. She exhibits no distension. There is no tenderness. There is no rebound.  Neurological: She is alert and oriented to person, place, and time. Coordination normal.  Skin: Skin is warm and dry.   Filed Vitals:   11/12/14 0852  BP: 132/70  Pulse: 67  Temp: 98.2 F (36.8 C)  TempSrc: Oral  Resp: 12  Height: 5\' 1"  (1.549 m)  Weight: 165 lb (74.844 kg)  SpO2: 96%      Assessment & Plan:

## 2014-12-12 ENCOUNTER — Telehealth: Payer: Self-pay | Admitting: *Deleted

## 2014-12-12 ENCOUNTER — Encounter: Payer: Self-pay | Admitting: *Deleted

## 2014-12-12 ENCOUNTER — Encounter: Payer: Commercial Managed Care - HMO | Attending: Internal Medicine | Admitting: *Deleted

## 2014-12-12 ENCOUNTER — Other Ambulatory Visit: Payer: Self-pay | Admitting: Geriatric Medicine

## 2014-12-12 VITALS — Ht 61.0 in | Wt 165.8 lb

## 2014-12-12 DIAGNOSIS — Z713 Dietary counseling and surveillance: Secondary | ICD-10-CM | POA: Insufficient documentation

## 2014-12-12 DIAGNOSIS — IMO0001 Reserved for inherently not codable concepts without codable children: Secondary | ICD-10-CM

## 2014-12-12 DIAGNOSIS — E1165 Type 2 diabetes mellitus with hyperglycemia: Secondary | ICD-10-CM | POA: Insufficient documentation

## 2014-12-12 DIAGNOSIS — IMO0002 Reserved for concepts with insufficient information to code with codable children: Secondary | ICD-10-CM

## 2014-12-12 NOTE — Telephone Encounter (Signed)
Completed - requested 3 month A1c be scheduled for 02/12/2015 for evaluation in f/u with me on 02/18/15. Pt to go to lab that week and have blood drawn.   By Bernie Covey, RN

## 2014-12-12 NOTE — Patient Instructions (Addendum)
Plan:  Aim for 2-3 Carb Choices per meal (30-45 grams) +/- 1 either way  Aim for 0-15 Carbs per snack if hungry  Include protein in moderation with your meals and snacks Consider reading food labels for Total Carbohydrate and Fat Grams of foods Consider  increasing your activity level by walking for 30 minutes daily as tolerated continue checking BG at alternate times per day as directed by MD  Continue taking medication as directed by MD  Cheerios 1C , almond milk, (egg, nuts, peanut butter....) Try Special K Protein Cereal... No need to add protein source  Salad: add some raisins, craisins, croutons Peanut butter and jelly sandwich (spread not GLOP)  Pintos 1C + 2" square of corn bread  Spaghetti - 1C cooked pasta and salad  Miss Elmyra keep up the good work. You are already doing good work. Always remember to put your carbohydrates and proteins together.  Medicines: Take your Metformin, Januvia and glimepiride with your breakfast  Carry a bottle of water with you all the time.

## 2014-12-19 NOTE — Progress Notes (Signed)
Diabetes Self-Management Education  Visit Type: First/Initial  Appt. Start Time: 1000 Appt. End Time: 1130  12/19/2014  Whitney [name], identified by name and date of birth, is a 80 y.o. female with a diagnosis of Diabetes: Type 2. [name] presents for DSME due to an increase in her A1c. Three years ago it was 7% witih an increase to 7.7% at present.  ASSESSMENT  Height 5\' 1"  (1.549 m), weight 165 lb 12.8 oz (75.206 kg). Body mass index is 31.34 kg/(m^2).      Diabetes Self-Management Education - 12/12/14 1035    Visit Information   Visit Type First/Initial   Initial Visit   Diabetes Type Type 2   Are you currently following a meal plan? No   Are you taking your medications as prescribed? Yes   Health Coping   How would you rate your overall health? Good   Psychosocial Assessment   Patient Belief/Attitude about Diabetes Motivated to manage diabetes   Self-care barriers None   Self-management support Doctor's office;CDE visits   Other persons present Patient   Patient Concerns Nutrition/Meal planning;Healthy Lifestyle;Glycemic Control   Preferred Learning Style No preference indicated   Learning Readiness Change in progress   How often do you need to have someone help you when you read instructions, pamphlets, or other written materials from your doctor or pharmacy? 1 - Never   Complications   How often do you check your blood sugar? 1-2 times/day   Fasting Blood glucose range (mg/dL) 130-179   Postprandial Blood glucose range (mg/dL) 130-179   Dietary Intake   Breakfast 10:00  cheerios 1C, almond milk   Lunch 2:00  crackers and milk   Dinner 5-6:00  salad: greens, sliced chicken, egg, / hotdog, bun , applesauce   Beverage(s) almond milk, water   Exercise   Exercise Type ADL's   Patient Education   Previous Diabetes Education No   Disease state  Factors that contribute to the development of diabetes   Nutrition management  Role of diet in the treatment of  diabetes and the relationship between the three main macronutrients and blood glucose level;Information on hints to eating out and maintain blood glucose control.;Meal options for control of blood glucose level and chronic complications.   Physical activity and exercise  Role of exercise on diabetes management, blood pressure control and cardiac health.   Chronic complications Relationship between chronic complications and blood glucose control;Dental care;Retinopathy and reason for yearly dilated eye exams   Psychosocial adjustment Role of stress on diabetes   Individualized Goals (developed by patient)   Nutrition General guidelines for healthy choices and portions discussed   Medications take my medication as prescribed   Reducing Risk do foot checks daily   Outcomes   Expected Outcomes Demonstrated interest in learning. Expect positive outcomes   Future DMSE PRN   Program Status Completed      Individualized Plan for Diabetes Self-Management Training:   Learning Objective:  Patient will have a greater understanding of diabetes self-management. Patient education plan is to attend individual and/or group sessions per assessed needs and concerns.   Plan:   Patient Instructions  Plan:  Aim for 2-3 Carb Choices per meal (30-45 grams) +/- 1 either way  Aim for 0-15 Carbs per snack if hungry  Include protein in moderation with your meals and snacks Consider reading food labels for Total Carbohydrate and Fat Grams of foods Consider  increasing your activity level by walking for 30 minutes daily as tolerated  continue checking BG at alternate times per day as directed by MD  Continue taking medication as directed by MD  Cheerios 1C , almond milk, (egg, nuts, peanut butter....) Try Special K Protein Cereal... No need to add protein source  Salad: add some raisins, craisins, croutons Peanut butter and jelly sandwich (spread not GLOP)  Pintos 1C + 2" square of corn bread  Spaghetti -  1C cooked pasta and salad  Miss Murle keep up the good work. You are already doing good work. Always remember to put your carbohydrates and proteins together.  Medicines: Take your Metformin, Januvia and glimepiride with your breakfast  Carry a bottle of water with you all the time.   Expected Outcomes:  Demonstrated interest in learning. Expect positive outcomes  Education material provided: Living Well with Diabetes, A1C conversion sheet, Meal plan card, My Plate and Snack sheet  If problems or questions, patient to contact team via:  Phone  Future DSME appointment: PRN

## 2014-12-20 ENCOUNTER — Other Ambulatory Visit: Payer: Self-pay

## 2014-12-20 DIAGNOSIS — Z1231 Encounter for screening mammogram for malignant neoplasm of breast: Secondary | ICD-10-CM

## 2015-01-20 ENCOUNTER — Ambulatory Visit
Admission: RE | Admit: 2015-01-20 | Discharge: 2015-01-20 | Disposition: A | Discharge status: Home / Self Care | Payer: Commercial Managed Care - HMO | Source: Ambulatory Visit

## 2015-01-20 DIAGNOSIS — Z1231 Encounter for screening mammogram for malignant neoplasm of breast: Secondary | ICD-10-CM

## 2015-02-04 ENCOUNTER — Telehealth: Payer: Self-pay | Admitting: Internal Medicine

## 2015-02-04 NOTE — Telephone Encounter (Signed)
Patient is requesting [employer] referral to Dr. Katy Fitch.  Patient is having a follow up on cateract implant.  Patient states appointment is 10/27 at 12:45.  [facility]  Patient has appointment on 11/16 for class.

## 2015-02-06 NOTE — Telephone Encounter (Signed)
Patient already has referral in place for Dr. Katy Fitch good until 03/22/15. She does not need a referral for nutrition.

## 2015-02-06 NOTE — Telephone Encounter (Signed)
I spoke w/pt and made her aware.

## 2015-02-14 ENCOUNTER — Ambulatory Visit (INDEPENDENT_AMBULATORY_CARE_PROVIDER_SITE_OTHER): Payer: Commercial Managed Care - HMO

## 2015-02-14 ENCOUNTER — Other Ambulatory Visit (INDEPENDENT_AMBULATORY_CARE_PROVIDER_SITE_OTHER): Payer: Commercial Managed Care - HMO

## 2015-02-14 DIAGNOSIS — Z23 Encounter for immunization: Secondary | ICD-10-CM | POA: Diagnosis not present

## 2015-02-14 DIAGNOSIS — IMO0002 Reserved for concepts with insufficient information to code with codable children: Secondary | ICD-10-CM

## 2015-02-14 DIAGNOSIS — E1165 Type 2 diabetes mellitus with hyperglycemia: Secondary | ICD-10-CM | POA: Diagnosis not present

## 2015-02-14 LAB — HEMOGLOBIN A1C: Hgb A1c MFr Bld: 8 % — ABNORMAL HIGH (ref 4.6–6.5)

## 2015-02-18 ENCOUNTER — Ambulatory Visit: Payer: Commercial Managed Care - HMO | Admitting: *Deleted

## 2015-02-25 ENCOUNTER — Encounter: Payer: Self-pay | Admitting: *Deleted

## 2015-02-25 ENCOUNTER — Encounter: Payer: Commercial Managed Care - HMO | Attending: Internal Medicine | Admitting: *Deleted

## 2015-02-25 VITALS — Wt 165.2 lb

## 2015-02-25 DIAGNOSIS — E1165 Type 2 diabetes mellitus with hyperglycemia: Secondary | ICD-10-CM | POA: Insufficient documentation

## 2015-02-25 DIAGNOSIS — Z713 Dietary counseling and surveillance: Secondary | ICD-10-CM | POA: Diagnosis not present

## 2015-02-25 DIAGNOSIS — E119 Type 2 diabetes mellitus without complications: Secondary | ICD-10-CM

## 2015-02-25 NOTE — Progress Notes (Signed)
Diabetes Self-Management Education  Visit Type:  Follow-up  Appt. Start Time: 0800 Appt. End Time: 0/830  02/25/2015  Whitney [name], identified by name and date of birth, is a 80 y.o. female with a diagnosis of Diabetes: Type 2.  Whitney [name] is doing a reasonably good job of following her nutritional guidelines. She struggles to remember to take some of her morning medications. We have developed a PLAN to assist in both these areas. Her last visit with Dr. Sharlet Salina was 11/2014 with next appointment scheduled in 05/2014. In review of her glucose readings and dietary recall I feel that Whitney [name] would benefit from being seen by PCP in 3 mos rather than 6 mos. Under Whitney [name]'s direction  I contacted Dr. Hale Bogus office and reschedule appointment for Monday 03/17/2015 @ 9:45.  ASSESSMENT  Weight 165 lb 3.2 oz (74.934 kg). Body mass index is 31.23 kg/(m^2).       Diabetes Self-Management Education - 02/25/15 0809    Psychosocial Assessment   Patient Belief/Attitude about Diabetes Motivated to manage diabetes   Self-care barriers Other (comment)  gets out of her routine and forgets to take meds   Self-management support Doctor's office;Family;CDE visits   Patient Concerns Glycemic Control   Special Needs None   Preferred Learning Style No preference indicated   Learning Readiness Change in progress   Complications   Last HgB A1C per patient/outside source 8 %  02/12/15 - up from 7.8 in 11/2014   How often do you check your blood sugar? 1-2 times/day   Fasting Blood glucose range (mg/dL) 70-129;>200   Postprandial Blood glucose range (mg/dL) 70-129;180-200   Number of hypoglycemic episodes per month 0   Dietary Intake   Breakfast 3/4C grits, (oatmeal/ cheerios) almond milk, 2 link sausage, 1 piece loast, hot tea   Lunch baked sweet potatoe, vegetable crackers,    Snack (afternoon) vegetable crackers / vanilla wafers & peanut butter   Dinner ranch chicken, greens, creamed potatoes    Beverage(s) hot tea, water, diet soda,    Exercise   Exercise Type ADL's   Patient Education   Previous Diabetes Education Yes (please comment)   Medications Other (comment)  discussed alternative routine for medication administration   Psychosocial adjustment Other (comment)  family very supportive of dietary needs   Individualized Goals (developed by patient)   Nutrition General guidelines for healthy choices and portions discussed   Medications Other (comment)  Presently taking meds before and after breakfast, Take all morning medications before breakfast so that you won't forget to take the after breakfast meds.   Problem Solving --  discussed medication and dietary modifications to better manage glucose   Reducing Risk get labs drawn  patient voiced concerns over potential kidney and liver damage due to medications. I assured her that her PCP was monitoring those labs for her.   Patient Self-Evaluation of Goals - Patient rates self as meeting previously set goals (% of time)   Nutrition >75%   Physical Activity < 25%   Medications >75%   Monitoring >75%   Outcomes   Program Status Completed      Learning Objective:  Patient will have a greater understanding of diabetes self-management. Patient education plan is to attend individual and/or group sessions per assessed needs and concerns.   Plan:   Patient Instructions  ALWAYS have balance in your meals..... Starch/fruit + vegetable + PROTEIN Need to add some protein to your sweet potatoe, maybe some nut or cheese MEDICATIONS: Tend  to forget to take medications after breakfast......Marland KitchenConsider taking all of your morning medications before breakfast.   You are doing a great job...Marland Kitchenonly a few little changes...  We have rescheduled your appointment with Dr. Sharlet Salina for Monday December 5 @ 9:45    Expected Outcomes:  Demonstrated interest in learning. Expect positive outcomes  If problems or questions, patient to contact  team via:  Phone  Future DSME appointment: - PRN 2017

## 2015-02-25 NOTE — Patient Instructions (Addendum)
ALWAYS have balance in your meals..... Starch/fruit + vegetable + PROTEIN Need to add some protein to your sweet potatoe, maybe some nut or cheese MEDICATIONS: Tend to forget to take medications after breakfast......Marland KitchenConsider taking all of your morning medications before breakfast.   You are doing a great job...Marland Kitchenonly a few little changes...  We have rescheduled your appointment with Dr. Megan Salon for Monday December 5 @ 9:45

## 2015-03-02 ENCOUNTER — Emergency Department (HOSPITAL_COMMUNITY)
Admission: EM | Admit: 2015-03-02 | Discharge: 2015-03-02 | Disposition: A | Discharge status: Home / Self Care | Payer: Commercial Managed Care - HMO | Source: Home / Self Care

## 2015-03-02 ENCOUNTER — Encounter (HOSPITAL_COMMUNITY): Payer: Self-pay | Admitting: Emergency Medicine

## 2015-03-02 ENCOUNTER — Emergency Department (INDEPENDENT_AMBULATORY_CARE_PROVIDER_SITE_OTHER): Payer: Commercial Managed Care - HMO

## 2015-03-02 DIAGNOSIS — J40 Bronchitis, not specified as acute or chronic: Secondary | ICD-10-CM | POA: Diagnosis not present

## 2015-03-02 MED ORDER — BENZONATATE 100 MG PO CAPS: 100.0000 mg | ORAL_CAPSULE | Freq: Three times a day (TID) | ORAL | Status: DC

## 2015-03-02 MED ORDER — AMOXICILLIN 500 MG PO CAPS: 500.0000 mg | ORAL_CAPSULE | Freq: Three times a day (TID) | ORAL | Status: DC

## 2015-03-02 NOTE — ED Provider Notes (Signed)
CSN: [id number]     Arrival date & time 03/02/15  1520 History   None    Chief Complaint  Patient presents with  . URI   (Consider location/radiation/quality/duration/timing/severity/associated sxs/prior Treatment) HPI History obtained from patient:   LOCATION: Upper respiratory SEVERITY: No pain DURATION: Almost 2 weeks CONTEXT: Gradual onset QUALITY: MODIFYING FACTORS: Mucinex without improvement ASSOCIATED SYMPTOMS: Constant cough TIMING: Constant OCCUPATION: Retired  Past Medical History  Diagnosis Date  . Glaucoma   . Hypertension   . Chest pain   . Hypercholesteremia   . Diabetes mellitus type 2, uncontrolled ([place])   . Obesity   . GERD (gastroesophageal reflux disease)   . Diverticulosis of colon   . DJD (degenerative joint disease) of knee   . Baker's cyst   . Lumbar spondylosis   . Disc disease, degenerative, cervical   . Osteopenia   . Facial pain, atypical   . Anxiety   . Onychomycosis   . Diabetes mellitus without complication [facility])    Past Surgical History  Procedure Laterality Date  . Vaginal hysterectomy  2002    Dr. Kem Kays repair  . Right eye surgery  09/2010    retina in right eye   Family History  Problem Relation Age of Onset  . Colon cancer Neg Hx   . Stomach cancer Neg Hx   . Diabetes Mother    Social History  Substance Use Topics  . Smoking status: Never Smoker   . Smokeless tobacco: Never Used  . Alcohol Use: No   OB History    No data available     Review of Systems ROS +'ve cough  Denies: HEADACHE, NAUSEA, ABDOMINAL PAIN, CHEST PAIN, CONGESTION, DYSURIA, SHORTNESS OF BREATH  Allergies  Review of patient's allergies indicates no known allergies.  Home Medications   Prior to Admission medications   Medication Sig Start Date End Date Taking? Authorizing Provider  amLODipine (NORVASC) 10 MG tablet TAKE ONE TABLET BY MOUTH EVERY DAY 11/12/14  Yes [name], MD  aspirin 81 MG tablet Take 1 tablet (81 mg total)  by mouth daily. 05/20/14  Yes [name], MD  Calcium Carbonate-Vitamin D (CALCIUM-VITAMIN D) 500-200 MG-UNIT per tablet Take 1 tablet by mouth 2 (two) times daily with a meal. 05/20/14  Yes [name], MD  glimepiride (AMARYL) 4 MG tablet TAKE ONE TABLET EVERY DAY BEFORE BREAKFAST 11/12/14  Yes [name], MD  latanoprost (XALATAN) 0.005 % ophthalmic solution Place 1 drop into both eyes at bedtime.  02/01/14  Yes Historical Provider, MD  lisinopril-hydrochlorothiazide (PRINZIDE,ZESTORETIC) 20-12.5 MG per tablet TAKE ONE TABLET BY MOUTH EVERY DAY 11/12/14  Yes [name], MD  metFORMIN (GLUCOPHAGE) 1000 MG tablet Take 1 tablet (1,000 mg total) by mouth 2 (two) times daily with a meal. 11/12/14  Yes [name], MD  Multiple Vitamin (MULTI VITAMIN DAILY PO) Take one by mouth daily 06/06/13  Yes Historical Provider, MD  simvastatin (ZOCOR) 20 MG tablet Take 1 tablet (20 mg total) by mouth at bedtime. 11/12/14  Yes [name], MD  sitaGLIPtin (JANUVIA) 100 MG tablet Take 1 tablet (100 mg total) by mouth daily. 11/12/14  Yes [name], MD  Blood Glucose Monitoring Suppl (PRODIGY AUTOCODE BLOOD GLUCOSE) DEVI Test blood sugar as directed 06/08/13   Noralee Space, MD  glucose blood (PRODIGY NO CODING BLOOD GLUC) test strip Use to check blood sugars twice a day E11.9 11/12/14   [name], MD  PRODIGY  TWIST TOP LANCETS 28G MISC Use as directed 05/20/14   [name], MD   Meds Ordered and Administered this Visit  Medications - No data to display  BP 108/62 mmHg  Pulse 69  Temp(Src) 98 F (36.7 C) (Oral)  Resp 16  SpO2 97% No data found.   Physical Exam NURSES NOTES AND VITAL SIGNS REVIEWED. CONSTITUTIONAL: Well developed, well nourished, no acute distress HEENT: normocephalic, atraumatic EYES: Conjunctiva normal NECK:normal ROM, supple PULMONARY:No respiratory distress, normal effort, Lungs: Few rhonchi diffuse throughout the  lung fields. No crackles noted. No focal consolidation. CARDIOVASCULAR: RRR, no murmur ABDOMEN: soft, ND, NT, +'ve BS MUSCULOSKELETAL: Normal ROM of all extremities SKIN: warm and dry without rash PSYCHIATRIC: Mood and affect normal  ED Course  Procedures (including critical care time)  Labs Review Labs Reviewed - No data to display  Imaging Review No results found.   Visual Acuity Review  Right Eye Distance:   Left Eye Distance:   Bilateral Distance:    Right Eye Near:   Left Eye Near:    Bilateral Near:         MDM   1. Bronchitis    Independent review of chest x-ray: No acute findings noted. Patient to be treated with amoxicillin and advised to follow up with her primary care provider. Patient does have a history of diabetes which needs to be monitored closely during her of chest infection. Instructions of care provided discharged home in stable condition    Konrad Felix, [place] 03/02/15 1611

## 2015-03-02 NOTE — ED Notes (Signed)
C/o prod cough onset 7 days associated w/runny nose and congestion Denies fevers, chills A&O x4... No acute distress.

## 2015-03-02 NOTE — Discharge Instructions (Signed)

## 2015-03-17 ENCOUNTER — Encounter: Payer: Self-pay | Admitting: Internal Medicine

## 2015-03-17 ENCOUNTER — Ambulatory Visit (INDEPENDENT_AMBULATORY_CARE_PROVIDER_SITE_OTHER): Payer: Commercial Managed Care - HMO | Admitting: Internal Medicine

## 2015-03-17 VITALS — BP 116/62 | HR 72 | Temp 98.0°F | Resp 14 | Ht 61.0 in | Wt 162.0 lb

## 2015-03-17 DIAGNOSIS — E785 Hyperlipidemia, unspecified: Secondary | ICD-10-CM

## 2015-03-17 DIAGNOSIS — I10 Essential (primary) hypertension: Secondary | ICD-10-CM | POA: Diagnosis not present

## 2015-03-17 DIAGNOSIS — E1165 Type 2 diabetes mellitus with hyperglycemia: Secondary | ICD-10-CM

## 2015-03-17 DIAGNOSIS — IMO0001 Reserved for inherently not codable concepts without codable children: Secondary | ICD-10-CM

## 2015-03-17 NOTE — Patient Instructions (Signed)
Keep working with the diet to help out the weights and the sugars.   The cough should keep going away on its own. It may take a couple more weeks.   Diabetes and Exercise Exercising regularly is important. It is not just about losing weight. It has many health benefits, such as:  Improving your overall fitness, flexibility, and endurance.  Increasing your bone density.  Helping with weight control.  Decreasing your body fat.  Increasing your muscle strength.  Reducing stress and tension.  Improving your overall health. People with diabetes who exercise gain additional benefits because exercise:  Reduces appetite.  Improves the body's use of blood sugar (glucose).  Helps lower or control blood glucose.  Decreases blood pressure.  Helps control blood lipids (such as cholesterol and triglycerides).  Improves the body's use of the hormone insulin by:  Increasing the body's insulin sensitivity.  Reducing the body's insulin needs.  Decreases the risk for heart disease because exercising:  Lowers cholesterol and triglycerides levels.  Increases the levels of good cholesterol (such as high-density lipoproteins [HDL]) in the body.  Lowers blood glucose levels. YOUR ACTIVITY PLAN  Choose an activity that you enjoy, and set realistic goals. To exercise safely, you should begin practicing any new physical activity slowly, and gradually increase the intensity of the exercise over time. Your health care provider or diabetes educator can help create an activity plan that works for you. General recommendations include:  Encouraging children to engage in at least 60 minutes of physical activity each day.  Stretching and performing strength training exercises, such as yoga or weight lifting, at least 2 times per week.  Performing a total of at least 150 minutes of moderate-intensity exercise each week, such as brisk walking or water aerobics.  Exercising at least 3 days per week,  making sure you allow no more than 2 consecutive days to pass without exercising.  Avoiding long periods of inactivity (90 minutes or more). When you have to spend an extended period of time sitting down, take frequent breaks to walk or stretch. RECOMMENDATIONS FOR EXERCISING WITH TYPE 1 OR TYPE 2 DIABETES   Check your blood glucose before exercising. If blood glucose levels are greater than 240 mg/dL, check for urine ketones. Do not exercise if ketones are present.  Avoid injecting insulin into areas of the body that are going to be exercised. For example, avoid injecting insulin into:  The arms when playing tennis.  The legs when jogging.  Keep a record of:  Food intake before and after you exercise.  Expected peak times of insulin action.  Blood glucose levels before and after you exercise.  The type and amount of exercise you have done.  Review your records with your health care provider. Your health care provider will help you to develop guidelines for adjusting food intake and insulin amounts before and after exercising.  If you take insulin or oral hypoglycemic agents, watch for signs and symptoms of hypoglycemia. They include:  Dizziness.  Shaking.  Sweating.  Chills.  Confusion.  Drink plenty of water while you exercise to prevent dehydration or heat stroke. Body water is lost during exercise and must be replaced.  Talk to your health care provider before starting an exercise program to make sure it is safe for you. Remember, almost any type of activity is better than none.   This information is not intended to replace advice given to you by your health care provider. Make sure you discuss any questions  you have with your health care provider.   Document Released: 06/19/2003 Document Revised: 08/13/2014 Document Reviewed: 09/05/2012 Elsevier Interactive Patient Education [employer].

## 2015-03-17 NOTE — Assessment & Plan Note (Signed)
She is doing well on simvastatin and check lipid at next blood draw. Goal LDL <100 with her DM. No side efffects. Continue.

## 2015-03-17 NOTE — Assessment & Plan Note (Signed)
She is taking amlodipine, lisinopril/hctz and controlled. Recent labs okay and no adjustments needed today. Not complicated.

## 2015-03-17 NOTE — Progress Notes (Signed)
   Subjective:    Patient ID: Whitney [name], female    DOB: October 30, 1934, 80 y.o.   MRN: [id number]  HPI The patient is a 80 YO female coming in for follow up of several medical problems including: diabetes (slightly above goal, no complications, taking glimepiride and metformin and januvia and lisinopril), her blood pressure (controlled without complications, taking amlodipine and lisinopril and hctz), and her cholesterol (not complicated, taking simvastatin without side effects). No new concerns today.    Review of Systems  Constitutional: Negative for fever, activity change, appetite change, fatigue and unexpected weight change.  Respiratory: Negative.   Cardiovascular: Negative.   Gastrointestinal: Negative.   Musculoskeletal: Negative for myalgias, back pain and gait problem.  Neurological: Negative for dizziness, weakness, light-headedness and numbness.  Psychiatric/Behavioral: Negative.       Objective:   Physical Exam  Constitutional: She is oriented to person, place, and time. She appears well-developed and well-nourished.  Overweight  HENT:  Head: Normocephalic and atraumatic.  Eyes: EOM are normal.  Neck: Normal range of motion.  Cardiovascular: Normal rate and regular rhythm.   Pulmonary/Chest: Effort normal and breath sounds normal. No respiratory distress. She has no wheezes. She has no rales.  Abdominal: Soft. Bowel sounds are normal. She exhibits no distension. There is no tenderness. There is no rebound.  Neurological: She is alert and oriented to person, place, and time. Coordination normal.  Skin: Skin is warm and dry.   Filed Vitals:   03/17/15 0939  BP: 116/62  Pulse: 72  Temp: 98 F (36.7 C)  TempSrc: Oral  Resp: 14  Height: 5\' 1"  (1.549 m)  Weight: 162 lb (73.483 kg)  SpO2: 97%      Assessment & Plan:

## 2015-03-17 NOTE — Progress Notes (Signed)
Pre visit review using our clinic review tool, if applicable. No additional management support is needed unless otherwise documented below in the visit note. 

## 2015-03-17 NOTE — Assessment & Plan Note (Signed)
Last HgA1c close to goal (8, goal <8). No complications and no hypoglycemia. She is on glimepiride, januvia, metformin. She is working with nutritionist to help with her control and is losing weight. She is down about 3 pounds since last visit and encouraged to continue with that. No change to regimen today. Foot exam done today and she is on ACE-I and statin.

## 2015-04-02 LAB — HM DIABETES EYE EXAM

## 2015-05-15 ENCOUNTER — Ambulatory Visit: Payer: Commercial Managed Care - HMO | Admitting: Internal Medicine

## 2015-05-19 ENCOUNTER — Encounter: Payer: Self-pay | Admitting: Internal Medicine

## 2015-05-19 ENCOUNTER — Other Ambulatory Visit (INDEPENDENT_AMBULATORY_CARE_PROVIDER_SITE_OTHER): Payer: Commercial Managed Care - HMO

## 2015-05-19 ENCOUNTER — Other Ambulatory Visit: Payer: Commercial Managed Care - HMO

## 2015-05-19 ENCOUNTER — Ambulatory Visit (INDEPENDENT_AMBULATORY_CARE_PROVIDER_SITE_OTHER): Payer: Commercial Managed Care - HMO | Admitting: Internal Medicine

## 2015-05-19 VITALS — BP 122/70 | HR 69 | Temp 98.0°F | Resp 14 | Ht 61.0 in | Wt 160.0 lb

## 2015-05-19 DIAGNOSIS — E119 Type 2 diabetes mellitus without complications: Secondary | ICD-10-CM

## 2015-05-19 DIAGNOSIS — Z780 Asymptomatic menopausal state: Secondary | ICD-10-CM

## 2015-05-19 DIAGNOSIS — R0982 Postnasal drip: Secondary | ICD-10-CM | POA: Diagnosis not present

## 2015-05-19 DIAGNOSIS — E1165 Type 2 diabetes mellitus with hyperglycemia: Secondary | ICD-10-CM

## 2015-05-19 DIAGNOSIS — IMO0001 Reserved for inherently not codable concepts without codable children: Secondary | ICD-10-CM

## 2015-05-19 LAB — MICROALBUMIN / CREATININE URINE RATIO
Creatinine,U: 86.5 mg/dL
Microalb Creat Ratio: 2.3 mg/g (ref 0.0–30.0)
Microalb, Ur: 2 mg/dL — ABNORMAL HIGH (ref 0.0–1.9)

## 2015-05-19 LAB — LIPID PANEL
Cholesterol: 144 mg/dL (ref 0–200)
HDL: 38.6 mg/dL — ABNORMAL LOW (ref >39.00)
LDL Cholesterol: 87 mg/dL (ref 0–99)
NonHDL: 105.05
Total CHOL/HDL Ratio: 4
Triglycerides: 88 mg/dL (ref 0.0–149.0)
VLDL: 17.6 mg/dL (ref 0.0–40.0)

## 2015-05-19 LAB — HEMOGLOBIN A1C: Hgb A1c MFr Bld: 7.9 % — ABNORMAL HIGH (ref 4.6–6.5)

## 2015-05-19 NOTE — Progress Notes (Signed)
   Subjective:    Patient ID: Whitney [name], female    DOB: April 09, 1935, 81 y.o.   MRN: [id number]  HPI The patient is a 81 YO female coming in for some post-nasal drip. She has struggled with it the last several weeks. Has not tried anything for it. Her cough had improved since last visit. She is getting some cough spells when the drainage is severe. Denies headaches, fevers, chills. Does have fairly severe glaucoma and is undergoing treatment for that currently.   Review of Systems  Constitutional: Negative for fever, activity change, appetite change, fatigue and unexpected weight change.  HENT: Positive for congestion and postnasal drip. Negative for ear discharge, ear pain, rhinorrhea, sinus pressure, sore throat and trouble swallowing.   Respiratory: Positive for cough. Negative for chest tightness, shortness of breath and wheezing.   Cardiovascular: Negative.   Gastrointestinal: Negative.   Musculoskeletal: Negative for myalgias, back pain and gait problem.  Neurological: Negative for dizziness, weakness, light-headedness and numbness.  Psychiatric/Behavioral: Negative.       Objective:   Physical Exam  Constitutional: She is oriented to person, place, and time. She appears well-developed and well-nourished.  Overweight  HENT:  Head: Normocephalic and atraumatic.  Oropharynx with minimal erythema and clear drainage, no sinus tenderness.   Eyes: EOM are normal.  Neck: Normal range of motion.  Cardiovascular: Normal rate and regular rhythm.   Pulmonary/Chest: Effort normal and breath sounds normal. No respiratory distress. She has no wheezes. She has no rales.  Abdominal: Soft. Bowel sounds are normal. She exhibits no distension. There is no tenderness. There is no rebound.  Lymphadenopathy:    She has no cervical adenopathy.  Neurological: She is alert and oriented to person, place, and time. Coordination normal.  Skin: Skin is warm and dry.   Filed Vitals:   05/19/15 0843    BP: 122/70  Pulse: 69  Temp: 98 F (36.7 C)  TempSrc: Oral  Resp: 14  Height: 5' 1"$  (1.549 m)  Weight: 160 lb (72.576 kg)  SpO2: 98%      Assessment & Plan:

## 2015-05-19 NOTE — Assessment & Plan Note (Signed)
Checking HgA1c today as due. She is taking her metformin, amaryl, januvia without problems. On lisinopril as well.

## 2015-05-19 NOTE — Assessment & Plan Note (Signed)
Advised to try zyrtec over the counter. Would avoid nasal corticosteroids with her uncontrolled glaucoma. No indication for antibiotics or imaging at this time. New problem and explained the etiology and how it was causing her cough at the visit.

## 2015-05-19 NOTE — Progress Notes (Signed)
Pre visit review using our clinic review tool, if applicable. No additional management support is needed unless otherwise documented below in the visit note. 

## 2015-05-19 NOTE — Patient Instructions (Signed)
We will check the blood work today and call you back with the results.   Come back in about 6 months or feel free to call us sooner if you have any problems or questions.   Diabetes and Standards of Medical Care Diabetes is complicated. You may find that your diabetes team includes a dietitian, nurse, diabetes educator, eye doctor, and more. To help everyone know what is going on and to help you get the care you deserve, the following schedule of care was developed to help keep you on track. Below are the tests, exams, vaccines, medicines, education, and plans you will need. HbA1c test This test shows how well you have controlled your glucose over the past 2-3 months. It is used to see if your diabetes management plan needs to be adjusted.   It is performed at least 2 times a year if you are meeting treatment goals.  It is performed 4 times a year if therapy has changed or if you are not meeting treatment goals. Blood pressure test  This test is performed at every routine medical visit. The goal is less than 140/90 mm Hg for most people, but 130/80 mm Hg in some cases. Ask your health care provider about your goal. Dental exam  Follow up with the dentist regularly. Eye exam  If you are diagnosed with type 1 diabetes as a child, get an exam upon reaching the age of 11 years or older and having had diabetes for 3-5 years. Yearly eye exams are recommended after that initial eye exam.  If you are diagnosed with type 1 diabetes as an adult, get an exam within 5 years of diagnosis and then yearly.  If you are diagnosed with type 2 diabetes, get an exam as soon as possible after the diagnosis and then yearly. Foot care exam  Visual foot exams are performed at every routine medical visit. The exams check for cuts, injuries, or other problems with the feet.  You should have a complete foot exam performed every year. This exam includes an inspection of the structure and skin of your feet, a check  of the pulses in your feet, and a check of the sensation in your feet.  Type 1 diabetes: The first exam is performed 5 years after diagnosis.  Type 2 diabetes: The first exam is performed at the time of diagnosis.  Check your feet nightly for cuts, injuries, or other problems with your feet. Tell your health care provider if anything is not healing. Kidney function test (urine microalbumin)  This test is performed once a year.  Type 1 diabetes: The first test is performed 5 years after diagnosis.  Type 2 diabetes: The first test is performed at the time of diagnosis.  A serum creatinine and estimated glomerular filtration rate (eGFR) test is done once a year to assess the level of chronic kidney disease (CKD), if present. Lipid profile (cholesterol, HDL, LDL, triglycerides)  Performed every 5 years for most people.  The goal for LDL is less than 100 mg/dL. If you are at high risk, the goal is less than 70 mg/dL.  The goal for HDL is 40 mg/dL-50 mg/dL for men and 50 mg/dL-60 mg/dL for women. An HDL cholesterol of 60 mg/dL or higher gives some protection against heart disease.  The goal for triglycerides is less than 150 mg/dL. Immunizations  The flu (influenza) vaccine is recommended yearly for every person 33 months of age or older who has diabetes.  The pneumonia (pneumococcal)  vaccine is recommended for every person 53 years of age or older who has diabetes. Adults 21 years of age or older may receive the pneumonia vaccine as a series of two separate shots.  The hepatitis B vaccine is recommended for adults shortly after they have been diagnosed with diabetes.  The Tdap (tetanus, diphtheria, and pertussis) vaccine should be given:  According to normal childhood vaccination schedules, for children.  Every 10 years, for adults who have diabetes. Diabetes self-management education  Education is recommended at diagnosis and ongoing as needed. Treatment plan  Your treatment plan  is reviewed at every medical visit.   This information is not intended to replace advice given to you by your health care provider. Make sure you discuss any questions you have with your health care provider.   Document Released: 01/24/2009 Document Revised: 04/19/2014 Document Reviewed: 08/29/2012 Elsevier Interactive Patient Education [employer].

## 2015-06-30 ENCOUNTER — Telehealth: Payer: Self-pay | Admitting: Internal Medicine

## 2015-06-30 DIAGNOSIS — H409 Unspecified glaucoma: Secondary | ICD-10-CM

## 2015-06-30 NOTE — Telephone Encounter (Signed)
Patient called to request a referral for injections into the eye  Dr Deloria Lair  Address: [address], [place], [place] [address] Phone: [phone] appt is 08/07/2015  Patient also advised that she needs glucose blood (PRODIGY NO CODING BLOOD GLUC) test strip [id number] sent in to [facility] mail order

## 2015-07-01 ENCOUNTER — Other Ambulatory Visit: Payer: Self-pay | Admitting: Geriatric Medicine

## 2015-07-01 MED ORDER — PRODIGY AUTOCODE BLOOD GLUCOSE DEVI: Status: DC

## 2015-07-01 NOTE — Telephone Encounter (Signed)
Sent test strips to pharmacy

## 2015-07-01 NOTE — Telephone Encounter (Signed)
Referral placed, okay to send strips for daily testing.

## 2015-07-15 ENCOUNTER — Other Ambulatory Visit: Payer: Self-pay

## 2015-07-15 MED ORDER — BLOOD GLUCOSE MONITOR KIT: PACK | Status: DC

## 2015-07-15 MED ORDER — ACCU-CHEK SOFTCLIX LANCET DEV MISC: Status: AC

## 2015-07-15 MED ORDER — GLUCOSE BLOOD VI STRP: ORAL_STRIP | Status: DC

## 2015-07-17 NOTE — Telephone Encounter (Signed)
Pt called in and said that [facility] still doesn't have the strip.  Can you resend them in   Fax number [phone]

## 2015-07-18 ENCOUNTER — Other Ambulatory Visit: Payer: Self-pay | Admitting: Geriatric Medicine

## 2015-07-18 MED ORDER — GLUCOSE BLOOD VI STRP: ORAL_STRIP | Status: DC

## 2015-07-18 NOTE — Telephone Encounter (Signed)
Re-sent to pharmacy.

## 2015-07-23 ENCOUNTER — Telehealth: Payer: Self-pay | Admitting: Internal Medicine

## 2015-07-23 DIAGNOSIS — E119 Type 2 diabetes mellitus without complications: Secondary | ICD-10-CM

## 2015-07-23 NOTE — Telephone Encounter (Signed)
Referral to podiatry for her feet to [facility].

## 2015-07-23 NOTE — Telephone Encounter (Signed)
Patient aware.

## 2015-07-23 NOTE — Telephone Encounter (Signed)
Pt called in because she would like a referral endo. Because pt is having some pain in her feet. She says that she is a diabetic. Pt would like to go to [facility] on [address] if possible. Please advise further.   Thanks.    CB: [phone]

## 2015-08-05 ENCOUNTER — Other Ambulatory Visit: Payer: Self-pay

## 2015-08-05 MED ORDER — BLOOD GLUCOSE MONITOR KIT: PACK | Status: DC

## 2015-08-05 NOTE — Telephone Encounter (Signed)
accu check aviva plus meter has been sent to [facility]

## 2015-08-06 ENCOUNTER — Other Ambulatory Visit: Payer: Self-pay

## 2015-08-06 MED ORDER — BLOOD GLUCOSE MONITOR KIT: PACK | Status: DC

## 2015-08-13 ENCOUNTER — Ambulatory Visit (INDEPENDENT_AMBULATORY_CARE_PROVIDER_SITE_OTHER): Payer: Commercial Managed Care - HMO | Admitting: Podiatry

## 2015-08-13 ENCOUNTER — Encounter: Payer: Self-pay | Admitting: Podiatry

## 2015-08-13 VITALS — BP 137/79 | HR 67 | Resp 12

## 2015-08-13 DIAGNOSIS — B351 Tinea unguium: Secondary | ICD-10-CM | POA: Diagnosis not present

## 2015-08-13 DIAGNOSIS — E119 Type 2 diabetes mellitus without complications: Secondary | ICD-10-CM

## 2015-08-13 NOTE — Progress Notes (Signed)
   Subjective:    Patient ID: Whitney [name], female    DOB: 31-May-1934, 81 y.o.   MRN: [id number]  HPI   This patient presents today concerned primarily with a one-year history of a color change and slight thickening in the distal aspect of the right hallux. She has noticed the change in this area has gradually and color changes and slight thickening. She denies any self treatment or professional treatment. She denies anything infection in around this area  Patient is a diabetic and denies any history of ulceration, claudication or amputation  Review of Systems  Skin: Positive for color change.       Objective:   Physical Exam  Orientated 3  Vascular: DP pulses 1/4 bilaterally PT pulses 1/4 bilaterally Capillary reflex immediate bilaterally  Neurological: Sensation to 10 g monofilament wire intact 5/5 bilaterally Vibratory sensation reactive bilaterally Ankle reflex equal and reactive bilaterally  Dermatological: No open skin lesions bilaterally The distal right hallux nail plate has some thickening with texture and color changes. The remaining nail plates are normal texture and normal trophic  Musculoskeletal: Pes planus bilaterally There is no restriction or crepitus on range of motion of ankle, subtalar, midtarsal joints bilaterally       Assessment & Plan:   Assessment: Satisfactory neurovascular status Diabetic without foot complications Distal onychomycoses right hallux  Plan: Today reviewed the results of examination with patient today. I recommended topical treatment for the distal onychomycoses in the right hallux. I suggested the patient apply Fungi-Nail to the area once daily 1 year. Reappoint yearly or as needed

## 2015-08-13 NOTE — Patient Instructions (Signed)
Today her diabetic foot screen demonstrated adequate pulsations and feeling in your right and left feet The very end of the right big toenail has some fungal infection that could be treated with over-the-counter Fungi-Nail apply daily 12 months. Do not apply nail polish when using Fungi-Nail  Diabetes and Foot Care Diabetes may cause you to have problems because of poor blood supply (circulation) to your feet and legs. This may cause the skin on your feet to become thinner, break easier, and heal more slowly. Your skin may become dry, and the skin may peel and crack. You may also have nerve damage in your legs and feet causing decreased feeling in them. You may not notice minor injuries to your feet that could lead to infections or more serious problems. Taking care of your feet is one of the most important things you can do for yourself.  HOME CARE INSTRUCTIONS  Wear shoes at all times, even in the house. Do not go barefoot. Bare feet are easily injured.  Check your feet daily for blisters, cuts, and redness. If you cannot see the bottom of your feet, use a mirror or ask someone for help.  Wash your feet with warm water (do not use hot water) and mild soap. Then pat your feet and the areas between your toes until they are completely dry. Do not soak your feet as this can dry your skin.  Apply a moisturizing lotion or petroleum jelly (that does not contain alcohol and is unscented) to the skin on your feet and to dry, brittle toenails. Do not apply lotion between your toes.  Trim your toenails straight across. Do not dig under them or around the cuticle. File the edges of your nails with an emery board or nail file.  Do not cut corns or calluses or try to remove them with medicine.  Wear clean socks or stockings every day. Make sure they are not too tight. Do not wear knee-high stockings since they may decrease blood flow to your legs.  Wear shoes that fit properly and have enough cushioning.  To break in new shoes, wear them for just a few hours a day. This prevents you from injuring your feet. Always look in your shoes before you put them on to be sure there are no objects inside.  Do not cross your legs. This may decrease the blood flow to your feet.  If you find a minor scrape, cut, or break in the skin on your feet, keep it and the skin around it clean and dry. These areas may be cleansed with mild soap and water. Do not cleanse the area with peroxide, alcohol, or iodine.  When you remove an adhesive bandage, be sure not to damage the skin around it.  If you have a wound, look at it several times a day to make sure it is healing.  Do not use heating pads or hot water bottles. They may burn your skin. If you have lost feeling in your feet or legs, you may not know it is happening until it is too late.  Make sure your health care provider performs a complete foot exam at least annually or more often if you have foot problems. Report any cuts, sores, or bruises to your health care provider immediately. SEEK MEDICAL CARE IF:   You have an injury that is not healing.  You have cuts or breaks in the skin.  You have an ingrown nail.  You notice redness on your legs  or feet.  You feel burning or tingling in your legs or feet.  You have pain or cramps in your legs and feet.  Your legs or feet are numb.  Your feet always feel cold. SEEK IMMEDIATE MEDICAL CARE IF:   There is increasing redness, swelling, or pain in or around a wound.  There is a red line that goes up your leg.  Pus is coming from a wound.  You develop a fever or as directed by your health care provider.  You notice a bad smell coming from an ulcer or wound.   This information is not intended to replace advice given to you by your health care provider. Make sure you discuss any questions you have with your health care provider.   Document Released: 03/26/2000 Document Revised: 11/29/2012 Document  Reviewed: 09/05/2012 Elsevier Interactive Patient Education [employer].

## 2015-10-09 ENCOUNTER — Encounter (HOSPITAL_COMMUNITY): Payer: Self-pay | Admitting: Emergency Medicine

## 2015-10-09 ENCOUNTER — Ambulatory Visit (HOSPITAL_COMMUNITY)
Admission: EM | Admit: 2015-10-09 | Discharge: 2015-10-09 | Disposition: A | Discharge status: Home / Self Care | Payer: Commercial Managed Care - HMO | Attending: Emergency Medicine | Admitting: Emergency Medicine

## 2015-10-09 ENCOUNTER — Ambulatory Visit (HOSPITAL_COMMUNITY): Payer: Commercial Managed Care - HMO

## 2015-10-09 DIAGNOSIS — M8580 Other specified disorders of bone density and structure, unspecified site: Secondary | ICD-10-CM | POA: Diagnosis not present

## 2015-10-09 DIAGNOSIS — Z7984 Long term (current) use of oral hypoglycemic drugs: Secondary | ICD-10-CM | POA: Insufficient documentation

## 2015-10-09 DIAGNOSIS — E669 Obesity, unspecified: Secondary | ICD-10-CM | POA: Diagnosis not present

## 2015-10-09 DIAGNOSIS — K219 Gastro-esophageal reflux disease without esophagitis: Secondary | ICD-10-CM | POA: Diagnosis not present

## 2015-10-09 DIAGNOSIS — M25531 Pain in right wrist: Secondary | ICD-10-CM | POA: Insufficient documentation

## 2015-10-09 DIAGNOSIS — S63501A Unspecified sprain of right wrist, initial encounter: Secondary | ICD-10-CM | POA: Insufficient documentation

## 2015-10-09 DIAGNOSIS — E7800 Pure hypercholesterolemia, unspecified: Secondary | ICD-10-CM | POA: Insufficient documentation

## 2015-10-09 DIAGNOSIS — Z79899 Other long term (current) drug therapy: Secondary | ICD-10-CM | POA: Insufficient documentation

## 2015-10-09 DIAGNOSIS — W19XXXA Unspecified fall, initial encounter: Secondary | ICD-10-CM | POA: Diagnosis not present

## 2015-10-09 DIAGNOSIS — H409 Unspecified glaucoma: Secondary | ICD-10-CM | POA: Insufficient documentation

## 2015-10-09 DIAGNOSIS — Z9889 Other specified postprocedural states: Secondary | ICD-10-CM | POA: Diagnosis not present

## 2015-10-09 DIAGNOSIS — Z7982 Long term (current) use of aspirin: Secondary | ICD-10-CM | POA: Insufficient documentation

## 2015-10-09 DIAGNOSIS — E119 Type 2 diabetes mellitus without complications: Secondary | ICD-10-CM | POA: Diagnosis not present

## 2015-10-09 DIAGNOSIS — I10 Essential (primary) hypertension: Secondary | ICD-10-CM | POA: Diagnosis not present

## 2015-10-09 DIAGNOSIS — F419 Anxiety disorder, unspecified: Secondary | ICD-10-CM | POA: Insufficient documentation

## 2015-10-09 NOTE — ED Notes (Signed)
Patient returned from [facility]...currently no room available

## 2015-10-09 NOTE — ED Provider Notes (Signed)
CSN: [id number]     Arrival date & time 10/09/15  1651 History   First MD Initiated Contact with Patient 10/09/15 1718     Chief Complaint  Patient presents with  . Fall  . Wrist Pain   (Consider location/radiation/quality/duration/timing/severity/associated sxs/prior Treatment) HPI  History obtained from patient: Location:  Right wrist Context/Duration:  Fall on outstretched hand approximately one and a half weeks ago Severity: 3  Quality: Aching, sore Timing:         Constant   Home Treatment: Velcro brace, Tylenol, cold packs Associated symptoms:  Unable to grip and hold with the wrist of and hand Family History: Hypertension    Past Medical History  Diagnosis Date  . Glaucoma   . Hypertension   . Chest pain   . Hypercholesteremia   . Diabetes mellitus type 2, uncontrolled ([place])   . Obesity   . GERD (gastroesophageal reflux disease)   . Diverticulosis of colon   . DJD (degenerative joint disease) of knee   . Baker's cyst   . Lumbar spondylosis   . Disc disease, degenerative, cervical   . Osteopenia   . Facial pain, atypical   . Anxiety   . Onychomycosis   . Diabetes mellitus without complication [facility])    Past Surgical History  Procedure Laterality Date  . Vaginal hysterectomy  2002    Dr. Kem Kays repair  . Right eye surgery  09/2010    retina in right eye   Family History  Problem Relation Age of Onset  . Colon cancer Neg Hx   . Stomach cancer Neg Hx   . Diabetes Mother    Social History  Substance Use Topics  . Smoking status: Never Smoker   . Smokeless tobacco: Never Used  . Alcohol Use: No   OB History    No data available     Review of Systems  Denies: HEADACHE, NAUSEA, ABDOMINAL PAIN, CHEST PAIN, CONGESTION, DYSURIA, SHORTNESS OF BREATH   Allergies  Review of patient's allergies indicates no known allergies.  Home Medications   Prior to Admission medications   Medication Sig Start Date End Date Taking? Authorizing Provider   amLODipine (NORVASC) 10 MG tablet TAKE ONE TABLET BY MOUTH EVERY DAY 11/12/14  Yes [name], MD  aspirin 81 MG tablet Take 1 tablet (81 mg total) by mouth daily. 05/20/14  Yes [name], MD  blood glucose meter kit and supplies KIT Use to test blood sugar up to 3 times daily. DX E11.9 08/06/15  Yes [name], MD  Calcium Carbonate-Vitamin D (CALCIUM-VITAMIN D) 500-200 MG-UNIT per tablet Take 1 tablet by mouth 2 (two) times daily with a meal. 05/20/14  Yes [name], MD  glimepiride (AMARYL) 4 MG tablet TAKE ONE TABLET EVERY DAY BEFORE BREAKFAST 11/12/14  Yes [name], MD  glucose blood (COOL BLOOD GLUCOSE TEST STRIPS) test strip Use to test blood sugar up to 3 times daily. DX E11.9 07/18/15  Yes [name], MD  Lancet Devices [facility]) lancets Use to test blood sugar up to 3 times a day. DX E11.9 07/15/15  Yes [name], MD  latanoprost (XALATAN) 0.005 % ophthalmic solution Place 1 drop into both eyes at bedtime.  02/01/14  Yes Historical Provider, MD  lisinopril-hydrochlorothiazide (PRINZIDE,ZESTORETIC) 20-12.5 MG per tablet TAKE ONE TABLET BY MOUTH EVERY DAY 11/12/14  Yes [name], MD  metFORMIN (GLUCOPHAGE) 1000 MG tablet Take 1 tablet (1,000 mg total) by mouth 2 (two)  times daily with a meal. 11/12/14  Yes [name], MD  Multiple Vitamin (MULTI VITAMIN DAILY PO) Take one by mouth daily 06/06/13  Yes Historical Provider, MD  simvastatin (ZOCOR) 20 MG tablet Take 1 tablet (20 mg total) by mouth at bedtime. 11/12/14  Yes [name], MD  sitaGLIPtin (JANUVIA) 100 MG tablet Take 1 tablet (100 mg total) by mouth daily. 11/12/14  Yes [name], MD  atropine 1 % ophthalmic solution Place 1 drop into both eyes 4 (four) times daily.  03/12/15   Historical Provider, MD  dorzolamide-timolol (COSOPT) 22.3-6.8 MG/ML ophthalmic solution Place 1 drop into both eyes 2 (two) times daily.  12/24/14    Historical Provider, MD  DUREZOL 0.05 % EMUL  12/10/14   Historical Provider, MD  ketorolac (ACULAR) 0.4 % SOLN INSTILL 1 DROP INTO EACH EYE QID 04/25/15   Historical Provider, MD   Meds Ordered and Administered this Visit  Medications - No data to display  BP 130/56 mmHg  Pulse 84  Temp(Src) 98.7 F (37.1 C) (Oral)  Resp 12  SpO2 98% No data found.   Physical Exam NURSES NOTES AND VITAL SIGNS REVIEWED. CONSTITUTIONAL: Well developed, well nourished, no acute distress HEENT: normocephalic, atraumatic EYES: Conjunctiva normal NECK:normal ROM, supple, no adenopathy PULMONARY:No respiratory distress, normal effort ABDOMINAL: Soft, ND, NT BS+, No CVAT MUSCULOSKELETAL: Normal ROM of all extremities, right wrist is without swelling visible or palpable deformity. There is tenderness in the snuffbox. SKIN: warm and dry without rash PSYCHIATRIC: Mood and affect, behavior are normal  ED Course  Procedures (including critical care time)  Labs Review Labs Reviewed - No data to display  Imaging Review Dg Wrist Complete Right  10/09/2015  CLINICAL DATA:  Pain following fall 5 days prior EXAM: RIGHT WRIST - COMPLETE 3+ VIEW COMPARISON:  None. FINDINGS: Frontal, oblique, lateral, and ulnar deviation scaphoid images were obtained. There is no demonstrable acute fracture or dislocation. There is erosion along the lateral distal scaphoid bone. There is osteoarthritic change in the scaphotrapezial and first carpal -metacarpal joints. There is also osteoarthritic change in the first MCP and IP joints. There is calcification within the lateral aspect of the wrist region. There is calcification in the radial artery. A calcification dorsal to the triquetrum bone is well corticated and may represent either arthropathic change or residua of old trauma in this area. IMPRESSION: Areas of arthropathic change. No erosion along the lateral scaphoid bone. Question old trauma dorsal to the triquetrum. No acute  fracture or dislocation evident. Atherosclerotic vascular calcification noted laterally. Electronically Signed   By: Lowella Grip III M.D.   On: 10/09/2015 18:13    Review of the x-rays and x-ray report by myself and uses part of the MDM. Visual Acuity Review  Right Eye Distance:   Left Eye Distance:   Bilateral Distance:    Right Eye Near:   Left Eye Near:    Bilateral Near:      Wrist splint is applied by nursing staff. X-ray results are discussed with patient.   MDM   1. Wrist sprain, right, initial encounter     Patient is reassured that there are no issues that require transfer to higher level of care at this time or additional tests. Patient is advised to continue home symptomatic treatment. Patient is advised that if there are new or worsening symptoms to attend the emergency department, contact primary care provider, or return to UC. Instructions of care provided discharged home in stable  condition.    THIS NOTE WAS GENERATED USING A VOICE RECOGNITION SOFTWARE PROGRAM. ALL REASONABLE EFFORTS  WERE MADE TO PROOFREAD THIS DOCUMENT FOR ACCURACY.  I have verbally reviewed the discharge instructions with the patient. A printed AVS was given to the patient.  All questions were answered prior to discharge.      Konrad Felix, PA 10/09/15 1906

## 2015-10-09 NOTE — ED Notes (Signed)
Patient transported to X-ray via [employer]

## 2015-10-09 NOTE — ED Notes (Signed)
The patient presented to the [facility] with a complaint of a right wrist injury secondary to a fall that occurred about 10 days prior.

## 2015-10-09 NOTE — Discharge Instructions (Signed)
Wrist Pain There are many things that can cause wrist pain. Some common causes include:  An injury to the wrist area.  Overuse of the joint.  A condition that causes too much pressure to be put on a nerve in the wrist (carpal tunnel syndrome).  Wear and tear of the joints that happens as a person gets older (osteoarthritis).  Other types of arthritis. Sometimes, the cause is not known. The pain often goes away when you follow instructions from your doctor about relieving pain at home. If your wrist pain does not go away, tests may need to be done to find the cause. HOME CARE Pay attention to any changes in your symptoms. Take these actions to help with your pain:  Rest your wrist for at least 48 hours or as told by your doctor.  If your doctor tells you to, put ice on the injured area:  Put ice in a plastic bag.  Place a towel between your skin and the bag.  Leave the ice on for 20 minutes, 2-3 times per day.  Keep your arm raised (elevated) above the level of your heart while you are sitting or lying down.  If a splint or elastic bandage has been put on the injured area:  Wear it as told by your doctor.  Take the splint or bandage off only as told by your doctor.  Loosen the splint or bandage if your fingers lose feeling (are numb) or have a tingling feeling, or if they turn cold or blue.  Take over-the-counter and prescription medicines only as told by your doctor.  Keep all follow-up visits as told by your doctor. This is important. GET HELP IF:  Your pain is not helped by treatment.  Your pain gets worse. GET HELP RIGHT AWAY IF:   Your fingers swell.  Your fingers turn white, very red, or cold and blue.  Your fingers lose feeling or have a tingling feeling.  You have trouble moving your fingers.   This information is not intended to replace advice given to you by your health care provider. Make sure you discuss any questions you have with your health care  provider.   Document Released: 09/15/2007 Document Revised: 12/18/2014 Document Reviewed: 08/14/2014 Elsevier Interactive Patient Education [employer].

## 2015-11-18 ENCOUNTER — Other Ambulatory Visit: Payer: Self-pay | Admitting: Internal Medicine

## 2015-11-19 ENCOUNTER — Other Ambulatory Visit (INDEPENDENT_AMBULATORY_CARE_PROVIDER_SITE_OTHER): Payer: Commercial Managed Care - HMO

## 2015-11-19 ENCOUNTER — Ambulatory Visit (INDEPENDENT_AMBULATORY_CARE_PROVIDER_SITE_OTHER): Payer: Commercial Managed Care - HMO | Admitting: Internal Medicine

## 2015-11-19 ENCOUNTER — Encounter: Payer: Self-pay | Admitting: Internal Medicine

## 2015-11-19 VITALS — BP 130/72 | HR 65 | Temp 97.7°F | Resp 18 | Ht 61.0 in | Wt 160.4 lb

## 2015-11-19 DIAGNOSIS — E119 Type 2 diabetes mellitus without complications: Secondary | ICD-10-CM

## 2015-11-19 LAB — COMPREHENSIVE METABOLIC PANEL
ALT: 50 U/L — ABNORMAL HIGH (ref 0–35)
AST: 39 U/L — ABNORMAL HIGH (ref 0–37)
Albumin: 4 g/dL (ref 3.5–5.2)
Alkaline Phosphatase: 46 U/L (ref 39–117)
BUN: 9 mg/dL (ref 6–23)
CO2: 30 mEq/L (ref 19–32)
Calcium: 10.2 mg/dL (ref 8.4–10.5)
Chloride: 104 mEq/L (ref 96–112)
Creatinine, Ser: 0.69 mg/dL (ref 0.40–1.20)
GFR: 105.61 mL/min (ref >60.00)
Glucose, Bld: 194 mg/dL — ABNORMAL HIGH (ref 70–99)
Potassium: 4.1 mEq/L (ref 3.5–5.1)
Sodium: 140 mEq/L (ref 135–145)
Total Bilirubin: 0.8 mg/dL (ref 0.2–1.2)
Total Protein: 7.6 g/dL (ref 6.0–8.3)

## 2015-11-19 LAB — HEMOGLOBIN A1C: Hgb A1c MFr Bld: 8.2 % — ABNORMAL HIGH (ref 4.6–6.5)

## 2015-11-19 NOTE — Patient Instructions (Addendum)
We will check the blood work today and call you back with the results.   Stop taking the [place] and after the blood work we may start another medicine. The new medicine will be called actos and it is a good medicine for the sugars and is generic as well.    Diabetes and Exercise Exercising regularly is important. It is not just about losing weight. It has many health benefits, such as:  Improving your overall fitness, flexibility, and endurance.  Increasing your bone density.  Helping with weight control.  Decreasing your body fat.  Increasing your muscle strength.  Reducing stress and tension.  Improving your overall health. People with diabetes who exercise gain additional benefits because exercise:  Reduces appetite.  Improves the body's use of blood sugar (glucose).  Helps lower or control blood glucose.  Decreases blood pressure.  Helps control blood lipids (such as cholesterol and triglycerides).  Improves the body's use of the hormone insulin by:  Increasing the body's insulin sensitivity.  Reducing the body's insulin needs.  Decreases the risk for heart disease because exercising:  Lowers cholesterol and triglycerides levels.  Increases the levels of good cholesterol (such as high-density lipoproteins [HDL]) in the body.  Lowers blood glucose levels. YOUR ACTIVITY PLAN  Choose an activity that you enjoy, and set realistic goals. To exercise safely, you should begin practicing any new physical activity slowly, and gradually increase the intensity of the exercise over time. Your health care provider or diabetes educator can help create an activity plan that works for you. General recommendations include:  Encouraging children to engage in at least 60 minutes of physical activity each day.  Stretching and performing strength training exercises, such as yoga or weight lifting, at least 2 times per week.  Performing a total of at least 150 minutes of  moderate-intensity exercise each week, such as brisk walking or water aerobics.  Exercising at least 3 days per week, making sure you allow no more than 2 consecutive days to pass without exercising.  Avoiding long periods of inactivity (90 minutes or more). When you have to spend an extended period of time sitting down, take frequent breaks to walk or stretch. RECOMMENDATIONS FOR EXERCISING WITH TYPE 1 OR TYPE 2 DIABETES   Check your blood glucose before exercising. If blood glucose levels are greater than 240 mg/dL, check for urine ketones. Do not exercise if ketones are present.  Avoid injecting insulin into areas of the body that are going to be exercised. For example, avoid injecting insulin into:  The arms when playing tennis.  The legs when jogging.  Keep a record of:  Food intake before and after you exercise.  Expected peak times of insulin action.  Blood glucose levels before and after you exercise.  The type and amount of exercise you have done.  Review your records with your health care provider. Your health care provider will help you to develop guidelines for adjusting food intake and insulin amounts before and after exercising.  If you take insulin or oral hypoglycemic agents, watch for signs and symptoms of hypoglycemia. They include:  Dizziness.  Shaking.  Sweating.  Chills.  Confusion.  Drink plenty of water while you exercise to prevent dehydration or heat stroke. Body water is lost during exercise and must be replaced.  Talk to your health care provider before starting an exercise program to make sure it is safe for you. Remember, almost any type of activity is better than none.   This  information is not intended to replace advice given to you by your health care provider. Make sure you discuss any questions you have with your health care provider.   Document Released: 06/19/2003 Document Revised: 08/13/2014 Document Reviewed: 09/05/2012 Elsevier  Interactive Patient Education [employer].

## 2015-11-19 NOTE — Assessment & Plan Note (Signed)
Checking HgA1c, foot exam done today. Not complicated. On ACE-I and statin. Taking metformin and amaryl. Will stop [place] for cost burden. Checking HgA1c. If close to 8 or above 8 will start actos for better control. Talked to her about the goal HgA1c around 7.5 or <8 for her age and the dangers of low sugars.

## 2015-11-19 NOTE — Progress Notes (Signed)
   Subjective:    Patient ID: Whitney [name], female    DOB: September 13, 1934, 81 y.o.   MRN: [id number]  HPI The patient is a 81 YO female coming in for follow up of her diabetes (taking metformin and amaryl and [place] but she cannot afford to keep taking it, also on ACE-I and statin, not complicated). Her Whitney [name] is too expensive and she is not happy with how it is working for her sugars. Over the recent years it has drifted up some and now can be rare 200 and she is not happy with that. She is used to her Hga1c being around 7.   Review of Systems  Constitutional: Negative for activity change, appetite change, fatigue, fever and unexpected weight change.  HENT: Positive for postnasal drip. Negative for ear discharge, ear pain, rhinorrhea, sinus pressure, sore throat and trouble swallowing.   Respiratory: Negative for cough, chest tightness, shortness of breath and wheezing.   Cardiovascular: Negative.   Gastrointestinal: Negative.   Musculoskeletal: Negative for back pain, gait problem and myalgias.  Neurological: Negative for dizziness, weakness, light-headedness and numbness.  Psychiatric/Behavioral: Negative.       Objective:   Physical Exam  Constitutional: She is oriented to person, place, and time. She appears well-developed and well-nourished.  Overweight  HENT:  Head: Normocephalic and atraumatic.  Eyes: EOM are normal.  Neck: Normal range of motion.  Cardiovascular: Normal rate and regular rhythm.   Pulmonary/Chest: Effort normal and breath sounds normal. No respiratory distress. She has no wheezes. She has no rales.  Abdominal: Soft. Bowel sounds are normal. She exhibits no distension. There is no tenderness. There is no rebound.  Lymphadenopathy:    She has no cervical adenopathy.  Neurological: She is alert and oriented to person, place, and time. Coordination normal.  Skin: Skin is warm and dry.  See foot exam   Vitals:   11/19/15 0831  BP: 130/72  Pulse: 65  Resp:  18  Temp: 97.7 F (36.5 C)  TempSrc: Oral  SpO2: 95%  Weight: 160 lb 6.4 oz (72.8 kg)  Height: 5\' 1"  (1.549 m)      Assessment & Plan:

## 2015-11-19 NOTE — Progress Notes (Signed)
Pre visit review using our clinic review tool, if applicable. No additional management support is needed unless otherwise documented below in the visit note. 

## 2015-11-24 ENCOUNTER — Telehealth: Payer: Self-pay | Admitting: Internal Medicine

## 2015-11-24 DIAGNOSIS — IMO0001 Reserved for inherently not codable concepts without codable children: Secondary | ICD-10-CM

## 2015-11-24 DIAGNOSIS — E1165 Type 2 diabetes mellitus with hyperglycemia: Principal | ICD-10-CM

## 2015-11-24 MED ORDER — PIOGLITAZONE HCL 15 MG PO TABS: 15.0000 mg | ORAL_TABLET | Freq: Every day | ORAL | 6 refills | Status: DC

## 2015-11-24 NOTE — Telephone Encounter (Signed)
Patient called back in to get lab results.  Gave patient MD response.  Patient states that there was a medication she could not afford but Sharlet Salina was waiting to get labs back.  Please follow up in regard.

## 2015-11-24 NOTE — Telephone Encounter (Signed)
Referral to nutrition placed as well as the actos to take 1 pill daily which should be affordable.

## 2015-11-24 NOTE — Telephone Encounter (Signed)
Patient aware.

## 2015-11-24 NOTE — Telephone Encounter (Signed)
Please advise on a medication that patient can afford to take, mentioned Actos. Patient said she is interested in seeing a nutritionist.

## 2015-12-08 ENCOUNTER — Other Ambulatory Visit: Payer: Self-pay | Admitting: Internal Medicine

## 2015-12-18 ENCOUNTER — Other Ambulatory Visit: Payer: Self-pay | Admitting: Internal Medicine

## 2015-12-23 ENCOUNTER — Encounter: Payer: Self-pay | Admitting: Dietician

## 2015-12-23 ENCOUNTER — Encounter: Payer: Commercial Managed Care - HMO | Attending: Internal Medicine | Admitting: Dietician

## 2015-12-23 DIAGNOSIS — E785 Hyperlipidemia, unspecified: Secondary | ICD-10-CM | POA: Diagnosis not present

## 2015-12-23 DIAGNOSIS — Z7984 Long term (current) use of oral hypoglycemic drugs: Secondary | ICD-10-CM | POA: Diagnosis not present

## 2015-12-23 DIAGNOSIS — I10 Essential (primary) hypertension: Secondary | ICD-10-CM | POA: Diagnosis not present

## 2015-12-23 DIAGNOSIS — K219 Gastro-esophageal reflux disease without esophagitis: Secondary | ICD-10-CM | POA: Insufficient documentation

## 2015-12-23 DIAGNOSIS — E119 Type 2 diabetes mellitus without complications: Secondary | ICD-10-CM

## 2015-12-23 DIAGNOSIS — Z713 Dietary counseling and surveillance: Secondary | ICD-10-CM | POA: Insufficient documentation

## 2015-12-23 NOTE — Progress Notes (Signed)
Diabetes Self-Management Education  Visit Type: Follow-up  Appt. Start Time: 1410 Appt. End Time: 1310  12/23/2015  Ms. Whitney [name], identified by name and date of birth, is a 81 y.o. female with a diagnosis of Diabetes:1991  .   Other hx includes HTN, Hyperlipidemia, GERD.  Medications for diabetes include:  Glimepiride, metformin, and pioglitazone.  She had to stop Januvia due to expense and did not think that it was benefiting her.  She was seen at this office by another provider last year.  She lives alone and shops and cooks for herself.   ASSESSMENT  Height 5\' 1"  (1.549 m), weight 163 lb (73.9 kg). Body mass index is 30.8 kg/m.  Weight stable from 02/2015 but states that she lost to 158 lbs this summer but regained it.      Diabetes Self-Management Education - 12/23/15 1418      Visit Information   Visit Type Follow-up     Health Coping   How would you rate your overall health? Good     Psychosocial Assessment   Patient Belief/Attitude about Diabetes Motivated to manage diabetes   Self-care barriers None   Self-management support Doctor's office;Family   Other persons present Patient   Patient Concerns Nutrition/Meal planning;Glycemic Control   Special Needs None   Preferred Learning Style No preference indicated   Learning Readiness Ready   How often do you need to have someone help you when you read instructions, pamphlets, or other written materials from your doctor or pharmacy? 1 - Never   What is the last grade level you completed in school? [id number] grade     Complications   Last HgB A1C per patient/outside source 8.2 %  11/2015 increased from 7.9% 05/2015   How often do you check your blood sugar? --  2 x per week   Fasting Blood glucose range (mg/dL) 180-200   Number of hypoglycemic episodes per month 0   Number of hyperglycemic episodes per week 7   Can you tell when your blood sugar is high? Yes   What do you do if your blood sugar is high? drink water   Have you had a dilated eye exam in the past 12 months? Yes   Have you had a dental exam in the past 12 months? No   Are you checking your feet? Yes   How many days per week are you checking your feet? 7     Dietary Intake   Breakfast plain cheerios and unsweetened almond milk, OR egg and grits, AND black coffee  9-10   Snack (morning) none   Lunch skips often OR fruit OR peanuts   Snack (afternoon) none   Dinner steamed cabbage, stew beef, corn  6   Snack (evening) seldome   Beverage(s) water, black coffee, hot tea     Exercise   Exercise Type Light (walking / raking leaves)  goes to [employer] and at home    How many days per week to you exercise? 4   How many minutes per day do you exercise? 30   Total minutes per week of exercise 120     Patient Education   Previous Diabetes Education Yes (please comment)  NDES 1 year ago     Subsequent Visit   Since your last visit have you continued or begun to take your medications as prescribed? Yes   Since your last visit have you had your blood pressure checked? Yes   Is your most recent blood  pressure lower, unchanged, or higher since your last visit? Unchanged   Since your last visit have you experienced any weight changes? Loss   Weight Loss (lbs) 2   Since your last visit, are you checking your blood glucose at least once a day? No  Just 2 x per week.  "It is depressing when the reading is high."      Individualized Plan for Diabetes Self-Management Training:   Learning Objective:  Patient will have a greater understanding of diabetes self-management. Patient education plan is to attend individual and/or group sessions per assessed needs and concerns.   Plan:   Patient Instructions  Be active everyday (gym, exercise program on TV, walking).  Aim for 30 minutes most days. Add protein to your breakfast:  (egg, 1 ounce of cheese, lean meat, nuts or peanut butter) Each meal should contain carbohydrate and protein. Avoid  skipping meals.  Light lunches might include:   Vegetable soup, 5 crackers, 1 ounce cheese   Bean burrito (tortilla, 1/2 cup fat free or vegetarian refried beans, cheese, lettuce, tomato, salsa, small amount sour cream.   Yogurt (around 15 grams carbohydrate), and fruit   1/2 cup pintos, sweet potato, greens   Lean hamburger on a bun, raw vegetables, small baked potato   Salad with chicken or beans or egg and cheese AND raisins or apple    Consider a small snack at night (1 carbohydrate choice and 1 protein choice).  Check your blood sugar as the doctor recommends.  This is information can help your doctor make decisions.      Expected Outcomes:     Education material provided: Meal plan card, My Plate and Snack sheet  If problems or questions, patient to contact team via:  Phone and Email  Future DSME appointment:

## 2015-12-23 NOTE — Patient Instructions (Signed)
Be active everyday (gym, exercise program on TV, walking).  Aim for 30 minutes most days. Add protein to your breakfast:  (egg, 1 ounce of cheese, lean meat, nuts or peanut butter) Each meal should contain carbohydrate and protein. Avoid skipping meals.  Light lunches might include:   Vegetable soup, 5 crackers, 1 ounce cheese   Bean burrito (tortilla, 1/2 cup fat free or vegetarian refried beans, cheese, lettuce, tomato, salsa, small amount sour cream.   Yogurt (around 15 grams carbohydrate), and fruit   1/2 cup pintos, sweet potato, greens   Lean hamburger on a bun, raw vegetables, small baked potato   Salad with chicken or beans or egg and cheese AND raisins or apple    Consider a small snack at night (1 carbohydrate choice and 1 protein choice).  Check your blood sugar as the doctor recommends.  This is information can help your doctor make decisions.

## 2015-12-25 ENCOUNTER — Other Ambulatory Visit: Payer: Self-pay | Admitting: Internal Medicine

## 2015-12-25 DIAGNOSIS — Z1231 Encounter for screening mammogram for malignant neoplasm of breast: Secondary | ICD-10-CM

## 2016-01-01 ENCOUNTER — Other Ambulatory Visit: Payer: Self-pay | Admitting: Internal Medicine

## 2016-01-21 ENCOUNTER — Encounter: Payer: Self-pay | Admitting: Internal Medicine

## 2016-01-21 ENCOUNTER — Ambulatory Visit: Payer: Commercial Managed Care - HMO

## 2016-01-21 LAB — HM DIABETES EYE EXAM

## 2016-01-22 ENCOUNTER — Ambulatory Visit
Admission: RE | Admit: 2016-01-22 | Discharge: 2016-01-22 | Disposition: A | Discharge status: Home / Self Care | Payer: Commercial Managed Care - HMO | Source: Ambulatory Visit | Attending: Internal Medicine | Admitting: Internal Medicine

## 2016-01-22 DIAGNOSIS — Z1231 Encounter for screening mammogram for malignant neoplasm of breast: Secondary | ICD-10-CM

## 2016-03-26 ENCOUNTER — Other Ambulatory Visit: Payer: Self-pay | Admitting: Pharmacist

## 2016-03-26 NOTE — Patient Outreach (Signed)
Outreach call to [employer] regarding her request for follow up from the [facility] Medication Adherence Campaign. Left a HIPAA compliant message on the patient's voicemail.   Harlow Asa, PharmD, [facility] [phone]

## 2016-04-14 NOTE — Progress Notes (Signed)
Pre visit review using our clinic review tool, if applicable. No additional management support is needed unless otherwise documented below in the visit note. 

## 2016-04-14 NOTE — Progress Notes (Addendum)
Subjective:   Whitney [name] is a 82 y.o. female who presents for Medicare Annual (Subsequent) preventive examination.  The Patient was informed that the wellness visit is to identify future health risk and educate and initiate measures that can reduce risk for increased disease through the lifespan.   Describes health as fair, good or great? "good" Enjoys church activities.   Review of Systems:  No ROS.  Medicare Wellness Visit.  Cardiac Risk Factors include: advanced age (>37mn, >>22women);diabetes mellitus;dyslipidemia;family history of premature cardiovascular disease;hypertension;obesity (BMI >30kg/m2)   Sleep patterns: Sleeps about 5 hours. Feels rested. Up to void x 2.  Home Safety/Smoke Alarms: Smoke detectors and security in place.    Living environment; residence and Firearm Safety: [place] 1 story [place].  Seat Belt Safety/Bike Helmet: Wears seat belt.   Counseling:   Eye Exam-Appointment this month , every 6 months by Dr. GKaty Fitch Followed by Dr. RZadie Rhinecurrently for complications of cataract surgery.  Dental-Last exam > 5 years, partial dentures. Denies mouth/teeth issues.   Female:   Pap-N/A, followed by Dr. HUlanda Edison     Mammo-01/22/2016, negative.       Dexa scan-12/06/2002, osteopenia. Recall 2 years. Order placed.         CCS-colonoscopy 06/19/2013, adenomatous polyps. No recall.       Objective:     Vitals: BP 120/60 (BP Location: Left Arm, Patient Position: Sitting, Cuff Size: Normal)   Pulse 61   Ht '5\' 1"'  (1.549 m)   Wt 160 lb 9.6 oz (72.8 kg)   SpO2 98%   BMI 30.35 kg/m   Body mass index is 30.35 kg/m.   Tobacco History  Smoking Status  . Never Smoker  Smokeless Tobacco  . Never Used     Counseling given: Not Answered   Past Medical History:  Diagnosis Date  . Anxiety   . Baker's cyst   . Chest pain   . Diabetes [place] type 2, uncontrolled [place]   . Diabetes mellitus without  complication ([place]   . Disc disease, degenerative, cervical   . Diverticulosis of colon   . DJD (degenerative joint disease) of knee   . Facial pain, atypical   . GERD (gastroesophageal reflux disease)   . Glaucoma   . Hypercholesteremia   . Hypertension   . Lumbar spondylosis   . Obesity   . Onychomycosis   . Osteopenia    Past Surgical History:  Procedure Laterality Date  . right eye surgery  09/2010   retina in right eye  . VAGINAL HYSTERECTOMY  2002   Dr. HKem Kaysrepair   Family History  Problem Relation Age of Onset  . Diabetes Mother   . Colon cancer Neg Hx   . Stomach cancer Neg Hx    History  Sexual Activity  . Sexual activity: Not on file    Outpatient Encounter Prescriptions as of 04/15/2016  Medication Sig  . amLODipine (NORVASC) 10 MG tablet TAKE ONE TABLET BY MOUTH EVERY DAY  . aspirin 81 MG tablet Take 1 tablet (81 mg total) by mouth daily.  .Marland Kitchenatropine 1 % ophthalmic solution Place 1 drop into both eyes 4 (four) times daily.   . blood glucose meter kit and supplies KIT Use to test blood sugar up to 3 times daily. DX E11.9  . Calcium Carbonate-Vitamin D (CALCIUM-VITAMIN D) 500-200 MG-UNIT per tablet Take 1 tablet by mouth 2 (two) times daily with a meal.  .  dorzolamide-timolol (COSOPT) 22.3-6.8 MG/ML ophthalmic solution Place 1 drop into both eyes 2 (two) times daily.   . DUREZOL 0.05 % EMUL   . gentamicin-prednisoLONE 0.3-1 % ophthalmic drops 1 drop 2 (two) times daily.  Marland Kitchen glimepiride (AMARYL) 4 MG tablet TAKE ONE TABLET EVERY DAY BEFORE BREAKFAST  . glucose blood (COOL BLOOD GLUCOSE TEST STRIPS) test strip Use to test blood sugar up to 3 times daily. DX E11.9  . ketorolac (ACULAR) 0.4 % SOLN INSTILL 1 DROP INTO EACH EYE QID  . Lancet Devices (ACCU-CHEK SOFTCLIX) lancets Use to test blood sugar up to 3 times a day. DX E11.9  . latanoprost (XALATAN) 0.005 % ophthalmic solution Place 1 drop into both eyes at bedtime.   Marland Kitchen lisinopril-hydrochlorothiazide  (PRINZIDE,ZESTORETIC) 20-12.5 MG tablet TAKE ONE TABLET BY MOUTH EVERY DAY  . metFORMIN (GLUCOPHAGE) 1000 MG tablet TAKE 1 TABLET TWICE DAILY WITH MEALS  . Multiple Vitamin (MULTI VITAMIN DAILY PO) Take one by mouth daily  . Omega-3 Fatty Acids (FISH OIL) 1000 MG CAPS Take 2 capsules by mouth every evening.  . pioglitazone (ACTOS) 15 MG tablet Take 1 tablet (15 mg total) by mouth daily.  . prednisoLONE acetate (PRED FORTE) 1 % ophthalmic suspension 1 drop 4 (four) times daily.  . simvastatin (ZOCOR) 20 MG tablet TAKE 1 TABLET (20 MG TOTAL) BY MOUTH AT BEDTIME.  . [DISCONTINUED] JANUVIA 100 MG tablet TAKE 1 TABLET (100 MG TOTAL) BY MOUTH DAILY. (Patient not taking: Reported on 12/23/2015)   No facility-administered encounter medications on file as of 04/15/2016.     Activities of Daily Living In your present state of health, do you have any difficulty performing the following activities: 04/15/2016  Hearing? N  Vision? N  Difficulty concentrating or making decisions? N  Walking or climbing stairs? N  Dressing or bathing? N  Doing errands, shopping? N  Preparing Food and eating ? N  Using the Toilet? N  In the past six months, have you accidently leaked urine? N  Do you have problems with loss of bowel control? N  Managing your Medications? N  Managing your Finances? N  Housekeeping or managing your Housekeeping? N  Some recent data might be hidden    Patient Care Team: [name], MD as PCP - General (Internal Medicine) Noralee Space, MD as Consulting Physician (Pulmonary Disease) Gean Birchwood, DPM as Consulting Physician (Podiatry)    Assessment:    Physical assessment deferred to PCP.  Exercise Activities and Dietary recommendations Current Exercise Habits: Home exercise routine, Type of exercise: (S) Other - see comments (Televised workout routine), Time (Minutes): 30, Frequency (Times/Week): 7, Weekly Exercise (Minutes/Week): 210, Intensity: Mild, Exercise limited  by: None identified   Diet (meal preparation, eat out, water intake, caffeinated beverages, dairy products, fruits and vegetables): Drinks water.   Breakfast: cereal, grits, oatmeal Lunch: meat and vegetables.  Dinner:  Snacks on crackers  Discussed diabetic diet and carb control. Encouraged to continue to exercise, increasing as tolerated.   Goals      Patient Stated   . <enter goal here> (pt-stated)          "get my A1C down" by continuing to exercise and watching carb intake.       Fall Risk Fall Risk  04/15/2016 12/23/2015 02/25/2015 12/12/2014 08/30/2014  Falls in the past year? Yes Yes No No No  Number falls in past yr: 1 1 - - -  Injury with Fall? No Yes - - -  Risk  for fall due to : - Other (Comment) - - -  Risk for fall due to (comments): - missed a step - - -  Follow up Falls prevention discussed - - - -   Depression Screen PHQ 2/9 Scores 04/15/2016 12/23/2015 02/25/2015 12/12/2014  PHQ - 2 Score 0 0 0 0     Cognitive Function MMSE - Mini Mental State Exam 08/30/2014  Not completed: Unable to complete   Ad8 score reviewed for issues:  Issues making decisions:no  Less interest in hobbies / activities:no  Repeats questions, stories (family complaining):no  Trouble using ordinary gadgets (microwave, computer, phone):no  Forgets the month or year: no  Mismanaging finances: no  Remembering appts:no  Daily problems with thinking and/or memory:no Ad8 score is=0          Immunization History  Administered Date(s) Administered  . Influenza Split 02/18/2011, 02/23/2012  . Influenza Whole 01/19/2005, 02/17/2009, 02/16/2010  . Influenza, High Dose Seasonal PF 02/14/2015  . Influenza,inj,Quad PF,36+ Mos 03/12/2013, 02/07/2014  . Pneumococcal Polysaccharide-23 04/12/1998, 02/16/2010  . Td 08/21/2002  . Tdap 08/23/2011   Screening Tests Health Maintenance  Topic Date Due  . INFLUENZA VACCINE  05/13/2016 (Originally 11/11/2015)  . ZOSTAVAX  05/18/2016  (Originally 02/06/1997)  . PNA vac Low Risk Adult (2 of 2 - PCV13) 05/18/2016 (Originally 02/17/2011)  . HEMOGLOBIN A1C  05/21/2016  . FOOT EXAM  11/18/2016  . OPHTHALMOLOGY EXAM  01/20/2017  . TETANUS/TDAP  08/22/2021  . DEXA SCAN  Completed   Declined flu vaccine today. Will have DEXA scan next month after PCP appointment.     Plan:     Eat heart healthy diet (full of fruits, vegetables, whole grains, lean protein, water--limit salt, fat, and sugar intake) and increase physical activity as tolerated.  Continue doing brain stimulating activities (puzzles, reading, adult coloring books, staying active) to keep memory sharp.   Bring a copy of your advance directives to your next office visit.  Schedule bone scan.  **Pt concerns: -reports left shoulder/axilla discomfort, but tolerable.                            -Fasting blood sugars at home ranging 140-180.                 Patient scheduled for fasting 6 month follow up next month.    During the course of the visit the patient was educated and counseled about the following appropriate screening and preventive services:   Vaccines to include Pneumoccal, Influenza, Hepatitis B, Td, Zostavax, HCV  Cardiovascular Disease  Colorectal cancer screening  Bone density screening  Diabetes screening  Glaucoma screening  Mammography/PAP  Nutrition counseling   Patient Instructions (the written plan) was given to the patient.   Gerilyn Nestle, RN  04/15/2016

## 2016-04-15 ENCOUNTER — Ambulatory Visit (INDEPENDENT_AMBULATORY_CARE_PROVIDER_SITE_OTHER): Payer: Commercial Managed Care - HMO

## 2016-04-15 VITALS — BP 120/60 | HR 61 | Ht 61.0 in | Wt 160.6 lb

## 2016-04-15 DIAGNOSIS — Z780 Asymptomatic menopausal state: Secondary | ICD-10-CM | POA: Diagnosis not present

## 2016-04-15 DIAGNOSIS — Z0000 Encounter for general adult medical examination without abnormal findings: Secondary | ICD-10-CM | POA: Diagnosis not present

## 2016-04-15 NOTE — Progress Notes (Signed)
Medical screening examination/treatment/procedure(s) were performed by non-physician practitioner and as supervising physician I was immediately available for consultation/collaboration. I agree with above. Ashlley Booher A Jewelle Whitner, MD 

## 2016-04-15 NOTE — Patient Instructions (Addendum)
Eat heart healthy diet (full of fruits, vegetables, whole grains, lean protein, water--limit salt, fat, and sugar intake) and increase physical activity as tolerated.  Continue doing brain stimulating activities (puzzles, reading, adult coloring books, staying active) to keep memory sharp.   Bring a copy of your advance directives to your next office visit.  Schedule bone scan.   Fall Prevention in the Home Introduction Falls can cause injuries. They can happen to people of all ages. There are many things you can do to make your home safe and to help prevent falls. What can I do on the outside of my home?  Regularly fix the edges of walkways and driveways and fix any cracks.  Remove anything that might make you trip as you walk through a door, such as a raised step or threshold.  Trim any bushes or trees on the path to your home.  Use bright outdoor lighting.  Clear any walking paths of anything that might make someone trip, such as rocks or tools.  Regularly check to see if handrails are loose or broken. Make sure that both sides of any steps have handrails.  Any raised decks and porches should have guardrails on the edges.  Have any leaves, snow, or ice cleared regularly.  Use sand or salt on walking paths during winter.  Clean up any spills in your garage right away. This includes oil or grease spills. What can I do in the bathroom?  Use night lights.  Install grab bars by the toilet and in the tub and shower. Do not use towel bars as grab bars.  Use non-skid mats or decals in the tub or shower.  If you need to sit down in the shower, use a plastic, non-slip stool.  Keep the floor dry. Clean up any water that spills on the floor as soon as it happens.  Remove soap buildup in the tub or shower regularly.  Attach bath mats securely with double-sided non-slip rug tape.  Do not have throw rugs and other things on the floor that can make you trip. What can I do in the  bedroom?  Use night lights.  Make sure that you have a light by your bed that is easy to reach.  Do not use any sheets or blankets that are too big for your bed. They should not hang down onto the floor.  Have a firm chair that has side arms. You can use this for support while you get dressed.  Do not have throw rugs and other things on the floor that can make you trip. What can I do in the kitchen?  Clean up any spills right away.  Avoid walking on wet floors.  Keep items that you use a lot in easy-to-reach places.  If you need to reach something above you, use a strong step stool that has a grab bar.  Keep electrical cords out of the way.  Do not use floor polish or wax that makes floors slippery. If you must use wax, use non-skid floor wax.  Do not have throw rugs and other things on the floor that can make you trip. What can I do with my stairs?  Do not leave any items on the stairs.  Make sure that there are handrails on both sides of the stairs and use them. Fix handrails that are broken or loose. Make sure that handrails are as long as the stairways.  Check any carpeting to make sure that it is firmly attached  to the stairs. Fix any carpet that is loose or worn.  Avoid having throw rugs at the top or bottom of the stairs. If you do have throw rugs, attach them to the floor with carpet tape.  Make sure that you have a light switch at the top of the stairs and the bottom of the stairs. If you do not have them, ask someone to add them for you. What else can I do to help prevent falls?  Wear shoes that:  Do not have high heels.  Have rubber bottoms.  Are comfortable and fit you well.  Are closed at the toe. Do not wear sandals.  If you use a stepladder:  Make sure that it is fully opened. Do not climb a closed stepladder.  Make sure that both sides of the stepladder are locked into place.  Ask someone to hold it for you, if possible.  Clearly mark and make  sure that you can see:  Any grab bars or handrails.  First and last steps.  Where the edge of each step is.  Use tools that help you move around (mobility aids) if they are needed. These include:  Canes.  Walkers.  Scooters.  Crutches.  Turn on the lights when you go into a dark area. Replace any light bulbs as soon as they burn out.  Set up your furniture so you have a clear path. Avoid moving your furniture around.  If any of your floors are uneven, fix them.  If there are any pets around you, be aware of where they are.  Review your medicines with your doctor. Some medicines can make you feel dizzy. This can increase your chance of falling. Ask your doctor what other things that you can do to help prevent falls. This information is not intended to replace advice given to you by your health care provider. Make sure you discuss any questions you have with your health care provider. Document Released: 01/23/2009 Document Revised: 09/04/2015 Document Reviewed: 05/03/2014  2017 [employer]  Health Maintenance, Female Introduction Adopting a healthy lifestyle and getting preventive care can go a long way to promote health and wellness. Talk with your health care provider about what schedule of regular examinations is right for you. This is a good chance for you to check in with your provider about disease prevention and staying healthy. In between checkups, there are plenty of things you can do on your own. Experts have done a lot of research about which lifestyle changes and preventive measures are most likely to keep you healthy. Ask your health care provider for more information. Weight and diet Eat a healthy diet  Be sure to include plenty of vegetables, fruits, low-fat dairy products, and lean protein.  Do not eat a lot of foods high in solid fats, added sugars, or salt.  Get regular exercise. This is one of the most important things you can do for your health.  Most  adults should exercise for at least 150 minutes each week. The exercise should increase your heart rate and make you sweat (moderate-intensity exercise).  Most adults should also do strengthening exercises at least twice a week. This is in addition to the moderate-intensity exercise. Maintain a healthy weight  Body mass index (BMI) is a measurement that can be used to identify possible weight problems. It estimates body fat based on height and weight. Your health care provider can help determine your BMI and help you achieve or maintain a healthy weight.  For females 50 years of age and older:  A BMI below 18.5 is considered underweight.  A BMI of 18.5 to 24.9 is normal.  A BMI of 25 to 29.9 is considered overweight.  A BMI of 30 and above is considered obese. Watch levels of cholesterol and blood lipids  You should start having your blood tested for lipids and cholesterol at 80 years of age, then have this test every 5 years.  You may need to have your cholesterol levels checked more often if:  Your lipid or cholesterol levels are high.  You are older than 80 years of age.  You are at high risk for heart disease. Cancer screening Lung Cancer  Lung cancer screening is recommended for adults 64-53 years old who are at high risk for lung cancer because of a history of smoking.  A yearly low-dose CT scan of the lungs is recommended for people who:  Currently smoke.  Have quit within the past 15 years.  Have at least a 30-pack-year history of smoking. A pack year is smoking an average of one pack of cigarettes a day for 1 year.  Yearly screening should continue until it has been 15 years since you quit.  Yearly screening should stop if you develop a health problem that would prevent you from having lung cancer treatment. Breast Cancer  Practice breast self-awareness. This means understanding how your breasts normally appear and feel.  It also means doing regular breast  self-exams. Let your health care provider know about any changes, no matter how small.  If you are in your 20s or 30s, you should have a clinical breast exam (CBE) by a health care provider every 1-3 years as part of a regular health exam.  If you are 28 or older, have a CBE every year. Also consider having a breast X-ray (mammogram) every year.  If you have a family history of breast cancer, talk to your health care provider about genetic screening.  If you are at high risk for breast cancer, talk to your health care provider about having an MRI and a mammogram every year.  Breast cancer gene (BRCA) assessment is recommended for women who have family members with BRCA-related cancers. BRCA-related cancers include:  Breast.  Ovarian.  Tubal.  Peritoneal cancers.  Results of the assessment will determine the need for genetic counseling and BRCA1 and BRCA2 testing. Cervical Cancer  Your health care provider may recommend that you be screened regularly for cancer of the pelvic organs (ovaries, uterus, and vagina). This screening involves a pelvic examination, including checking for microscopic changes to the surface of your cervix (Pap test). You may be encouraged to have this screening done every 3 years, beginning at age 18.  For women ages 54-65, health care providers may recommend pelvic exams and Pap testing every 3 years, or they may recommend the Pap and pelvic exam, combined with testing for human papilloma virus (HPV), every 5 years. Some types of HPV increase your risk of cervical cancer. Testing for HPV may also be done on women of any age with unclear Pap test results.  Other health care providers may not recommend any screening for nonpregnant women who are considered low risk for pelvic cancer and who do not have symptoms. Ask your health care provider if a screening pelvic exam is right for you.  If you have had past treatment for cervical cancer or a condition that could lead  to cancer, you need Pap tests and screening  for cancer for at least 20 years after your treatment. If Pap tests have been discontinued, your risk factors (such as having a new sexual partner) need to be reassessed to determine if screening should resume. Some women have medical problems that increase the chance of getting cervical cancer. In these cases, your health care provider may recommend more frequent screening and Pap tests. Colorectal Cancer  This type of cancer can be detected and often prevented.  Routine colorectal cancer screening usually begins at 80 years of age and continues through 80 years of age.  Your health care provider may recommend screening at an earlier age if you have risk factors for colon cancer.  Your health care provider may also recommend using home test kits to check for hidden blood in the stool.  A small camera at the end of a tube can be used to examine your colon directly (sigmoidoscopy or colonoscopy). This is done to check for the earliest forms of colorectal cancer.  Routine screening usually begins at age 66.  Direct examination of the colon should be repeated every 5-10 years through 80 years of age. However, you may need to be screened more often if early forms of precancerous polyps or small growths are found. Skin Cancer  Check your skin from head to toe regularly.  Tell your health care provider about any new moles or changes in moles, especially if there is a change in a mole's shape or color.  Also tell your health care provider if you have a mole that is larger than the size of a pencil eraser.  Always use sunscreen. Apply sunscreen liberally and repeatedly throughout the day.  Protect yourself by wearing long sleeves, pants, a wide-brimmed hat, and sunglasses whenever you are outside. Heart disease, diabetes, and high blood pressure  High blood pressure causes heart disease and increases the risk of stroke. High blood pressure is more  likely to develop in:  People who have blood pressure in the high end of the normal range (130-139/85-89 mm Hg).  People who are overweight or obese.  People who are African American.  If you are 82-69 years of age, have your blood pressure checked every 3-5 years. If you are 41 years of age or older, have your blood pressure checked every year. You should have your blood pressure measured twice-once when you are at a hospital or clinic, and once when you are not at a hospital or clinic. Record the average of the two measurements. To check your blood pressure when you are not at a hospital or clinic, you can use:  An automated blood pressure machine at a pharmacy.  A home blood pressure monitor.  If you are between 85 years and 39 years old, ask your health care provider if you should take aspirin to prevent strokes.  Have regular diabetes screenings. This involves taking a blood sample to check your fasting blood sugar level.  If you are at a normal weight and have a low risk for diabetes, have this test once every three years after 80 years of age.  If you are overweight and have a high risk for diabetes, consider being tested at a younger age or more often. Preventing infection Hepatitis B  If you have a higher risk for hepatitis B, you should be screened for this virus. You are considered at high risk for hepatitis B if:  You were born in a country where hepatitis B is common. Ask your health care provider which  countries are considered high risk.  Your parents were born in a high-risk country, and you have not been immunized against hepatitis B (hepatitis B vaccine).  You have HIV or AIDS.  You use needles to inject street drugs.  You live with someone who has hepatitis B.  You have had sex with someone who has hepatitis B.  You get hemodialysis treatment.  You take certain medicines for conditions, including cancer, organ transplantation, and autoimmune  conditions. Hepatitis C  Blood testing is recommended for:  Everyone born from [birth year] through 1963.  Anyone with known risk factors for hepatitis C. Sexually transmitted infections (STIs)  You should be screened for sexually transmitted infections (STIs) including gonorrhea and chlamydia if:  You are sexually active and are younger than 80 years of age.  You are older than 80 years of age and your health care provider tells you that you are at risk for this type of infection.  Your sexual activity has changed since you were last screened and you are at an increased risk for chlamydia or gonorrhea. Ask your health care provider if you are at risk.  If you do not have HIV, but are at risk, it may be recommended that you take a prescription medicine daily to prevent HIV infection. This is called pre-exposure prophylaxis (PrEP). You are considered at risk if:  You are sexually active and do not regularly use condoms or know the HIV status of your partner(s).  You take drugs by injection.  You are sexually active with a partner who has HIV. Talk with your health care provider about whether you are at high risk of being infected with HIV. If you choose to begin PrEP, you should first be tested for HIV. You should then be tested every 3 months for as long as you are taking PrEP. Pregnancy  If you are premenopausal and you may become pregnant, ask your health care provider about preconception counseling.  If you may become pregnant, take 400 to 800 micrograms (mcg) of folic acid every day.  If you want to prevent pregnancy, talk to your health care provider about birth control (contraception). Osteoporosis and menopause  Osteoporosis is a disease in which the bones lose minerals and strength with aging. This can result in serious bone fractures. Your risk for osteoporosis can be identified using a bone density scan.  If you are 83 years of age or older, or if you are at risk for  osteoporosis and fractures, ask your health care provider if you should be screened.  Ask your health care provider whether you should take a calcium or vitamin D supplement to lower your risk for osteoporosis.  Menopause may have certain physical symptoms and risks.  Hormone replacement therapy may reduce some of these symptoms and risks. Talk to your health care provider about whether hormone replacement therapy is right for you. Follow these instructions at home:  Schedule regular health, dental, and eye exams.  Stay current with your immunizations.  Do not use any tobacco products including cigarettes, chewing tobacco, or electronic cigarettes.  If you are pregnant, do not drink alcohol.  If you are breastfeeding, limit how much and how often you drink alcohol.  Limit alcohol intake to no more than 1 drink per day for nonpregnant women. One drink equals 12 ounces of beer, 5 ounces of wine, or 1 ounces of hard liquor.  Do not use street drugs.  Do not share needles.  Ask your health care provider for  help if you need support or information about quitting drugs.  Tell your health care provider if you often feel depressed.  Tell your health care provider if you have ever been abused or do not feel safe at home. This information is not intended to replace advice given to you by your health care provider. Make sure you discuss any questions you have with your health care provider. Document Released: 10/12/2010 Document Revised: 09/04/2015 Document Reviewed: 12/31/2014  2017 Elsevier     Diabetes and Foot Care Diabetes may cause you to have problems because of poor blood supply (circulation) to your feet and legs. This may cause the skin on your feet to become thinner, break easier, and heal more slowly. Your skin may become dry, and the skin may peel and crack. You may also have nerve damage in your legs and feet causing decreased feeling in them. You may not notice minor  injuries to your feet that could lead to infections or more serious problems. Taking care of your feet is one of the most important things you can do for yourself. Follow these instructions at home:  Wear shoes at all times, even in the house. Do not go barefoot. Bare feet are easily injured.  Check your feet daily for blisters, cuts, and redness. If you cannot see the bottom of your feet, use a mirror or ask someone for help.  Wash your feet with warm water (do not use hot water) and mild soap. Then pat your feet and the areas between your toes until they are completely dry. Do not soak your feet as this can dry your skin.  Apply a moisturizing lotion or petroleum jelly (that does not contain alcohol and is unscented) to the skin on your feet and to dry, brittle toenails. Do not apply lotion between your toes.  Trim your toenails straight across. Do not dig under them or around the cuticle. File the edges of your nails with an emery board or nail file.  Do not cut corns or calluses or try to remove them with medicine.  Wear clean socks or stockings every day. Make sure they are not too tight. Do not wear knee-high stockings since they may decrease blood flow to your legs.  Wear shoes that fit properly and have enough cushioning. To break in new shoes, wear them for just a few hours a day. This prevents you from injuring your feet. Always look in your shoes before you put them on to be sure there are no objects inside.  Do not cross your legs. This may decrease the blood flow to your feet.  If you find a minor scrape, cut, or break in the skin on your feet, keep it and the skin around it clean and dry. These areas may be cleansed with mild soap and water. Do not cleanse the area with peroxide, alcohol, or iodine.  When you remove an adhesive bandage, be sure not to damage the skin around it.  If you have a wound, look at it several times a day to make sure it is healing.  Do not use  heating pads or hot water bottles. They may burn your skin. If you have lost feeling in your feet or legs, you may not know it is happening until it is too late.  Make sure your health care provider performs a complete foot exam at least annually or more often if you have foot problems. Report any cuts, sores, or bruises to your health  care provider immediately. Contact a health care provider if:  You have an injury that is not healing.  You have cuts or breaks in the skin.  You have an ingrown nail.  You notice redness on your legs or feet.  You feel burning or tingling in your legs or feet.  You have pain or cramps in your legs and feet.  Your legs or feet are numb.  Your feet always feel cold. Get help right away if:  There is increasing redness, swelling, or pain in or around a wound.  There is a red line that goes up your leg.  Pus is coming from a wound.  You develop a fever or as directed by your health care provider.  You notice a bad smell coming from an ulcer or wound. This information is not intended to replace advice given to you by your health care provider. Make sure you discuss any questions you have with your health care provider. Document Released: 03/26/2000 Document Revised: 09/04/2015 Document Reviewed: 09/05/2012 Elsevier Interactive Patient Education  2017 [employer].

## 2016-05-06 ENCOUNTER — Ambulatory Visit: Payer: Commercial Managed Care - HMO

## 2016-05-20 ENCOUNTER — Ambulatory Visit: Payer: Commercial Managed Care - HMO | Admitting: Internal Medicine

## 2016-05-25 ENCOUNTER — Other Ambulatory Visit (INDEPENDENT_AMBULATORY_CARE_PROVIDER_SITE_OTHER): Payer: Medicare PPO

## 2016-05-25 ENCOUNTER — Ambulatory Visit (INDEPENDENT_AMBULATORY_CARE_PROVIDER_SITE_OTHER): Payer: Medicare PPO | Admitting: Internal Medicine

## 2016-05-25 ENCOUNTER — Ambulatory Visit (INDEPENDENT_AMBULATORY_CARE_PROVIDER_SITE_OTHER)
Admission: RE | Admit: 2016-05-25 | Discharge: 2016-05-25 | Disposition: A | Discharge status: Home / Self Care | Payer: Medicare PPO | Source: Ambulatory Visit | Attending: Internal Medicine | Admitting: Internal Medicine

## 2016-05-25 ENCOUNTER — Encounter: Payer: Self-pay | Admitting: Internal Medicine

## 2016-05-25 VITALS — BP 122/62 | HR 66 | Temp 98.1°F | Ht 61.0 in | Wt 160.0 lb

## 2016-05-25 DIAGNOSIS — Z780 Asymptomatic menopausal state: Secondary | ICD-10-CM | POA: Diagnosis not present

## 2016-05-25 DIAGNOSIS — Z23 Encounter for immunization: Secondary | ICD-10-CM

## 2016-05-25 DIAGNOSIS — E785 Hyperlipidemia, unspecified: Secondary | ICD-10-CM

## 2016-05-25 DIAGNOSIS — I10 Essential (primary) hypertension: Secondary | ICD-10-CM

## 2016-05-25 DIAGNOSIS — Z0000 Encounter for general adult medical examination without abnormal findings: Secondary | ICD-10-CM | POA: Diagnosis not present

## 2016-05-25 DIAGNOSIS — E119 Type 2 diabetes mellitus without complications: Secondary | ICD-10-CM

## 2016-05-25 LAB — COMPREHENSIVE METABOLIC PANEL
ALT: 39 U/L — ABNORMAL HIGH (ref 0–35)
AST: 32 U/L (ref 0–37)
Albumin: 4 g/dL (ref 3.5–5.2)
Alkaline Phosphatase: 43 U/L (ref 39–117)
BUN: 8 mg/dL (ref 6–23)
CO2: 30 mEq/L (ref 19–32)
Calcium: 10 mg/dL (ref 8.4–10.5)
Chloride: 104 mEq/L (ref 96–112)
Creatinine, Ser: 0.59 mg/dL (ref 0.40–1.20)
GFR: 126.35 mL/min (ref >60.00)
Glucose, Bld: 182 mg/dL — ABNORMAL HIGH (ref 70–99)
Potassium: 3.9 mEq/L (ref 3.5–5.1)
Sodium: 139 mEq/L (ref 135–145)
Total Bilirubin: 0.8 mg/dL (ref 0.2–1.2)
Total Protein: 7.5 g/dL (ref 6.0–8.3)

## 2016-05-25 LAB — LIPID PANEL
Cholesterol: 131 mg/dL (ref 0–200)
HDL: 38.8 mg/dL — ABNORMAL LOW (ref >39.00)
LDL Cholesterol: 68 mg/dL (ref 0–99)
NonHDL: 91.71
Total CHOL/HDL Ratio: 3
Triglycerides: 118 mg/dL (ref 0.0–149.0)
VLDL: 23.6 mg/dL (ref 0.0–40.0)

## 2016-05-25 LAB — HEMOGLOBIN A1C: Hgb A1c MFr Bld: 8.1 % — ABNORMAL HIGH (ref 4.6–6.5)

## 2016-05-25 NOTE — Assessment & Plan Note (Signed)
Checking HgA1c, last above goal of 8 and started actos. Also taking metformin and amaryl. No low sugars. Taking statin and ace-I. Not known to be complicated.

## 2016-05-25 NOTE — Progress Notes (Signed)
Pre visit review using our clinic review tool, if applicable. No additional management support is needed unless otherwise documented below in the visit note. 

## 2016-05-25 NOTE — Assessment & Plan Note (Signed)
Checking lipid panel, on simvastatin and adjust as needed.

## 2016-05-25 NOTE — Assessment & Plan Note (Signed)
Getting bone density today and given prevnar 13 to complete pneumonia series. Tetanus up to date and will have aged out of colonoscopy before her next one is due. Counseled about home safety and the dangers of distracted driving. Given 10 year screening recommendations.

## 2016-05-25 NOTE — Progress Notes (Signed)
   Subjective:    Patient ID: Whitney [name], female    DOB: [birth year], 82 y.o.   MRN: [id number]  HPI The patient is a 82 YO female coming in for physical. No new complaints. Getting bone density test today.   PMH, [facility], social history reviewed and updated.   Review of Systems  Constitutional: Positive for activity change. Negative for appetite change, chills, fever and unexpected weight change.  HENT: Negative.   Eyes: Negative.   Respiratory: Negative for cough, chest tightness and shortness of breath.   Cardiovascular: Negative for chest pain, palpitations and leg swelling.  Gastrointestinal: Negative for abdominal distention, abdominal pain, constipation, diarrhea, nausea and vomiting.  Musculoskeletal: Negative.   Skin: Negative.   Neurological: Negative.   Psychiatric/Behavioral: Negative.       Objective:   Physical Exam  Constitutional: She is oriented to person, place, and time. She appears well-developed and well-nourished.  Overweight  HENT:  Head: Normocephalic and atraumatic.  Eyes: EOM are normal.  Neck: Normal range of motion.  Cardiovascular: Normal rate and regular rhythm.   Pulmonary/Chest: Effort normal and breath sounds normal. No respiratory distress. She has no wheezes. She has no rales.  Abdominal: Soft. Bowel sounds are normal. She exhibits no distension. There is no tenderness. There is no rebound.  Musculoskeletal: She exhibits no edema.  Neurological: She is alert and oriented to person, place, and time. Coordination normal.  Skin: Skin is warm and dry.  Psychiatric: She has a normal mood and affect.   Vitals:   05/25/16 0940  BP: 122/62  Pulse: 66  Temp: 98.1 F (36.7 C)  TempSrc: Oral  SpO2: 99%  Weight: 160 lb (72.6 kg)  Height: 5\' 1"  (1.549 m)      Assessment & Plan:  Prevnar 13 given at visit.

## 2016-05-25 NOTE — Patient Instructions (Signed)
We are checking the labs today and you will get the bone density test done.  Health Maintenance, Female Introduction Adopting a healthy lifestyle and getting preventive care can go a long way to promote health and wellness. Talk with your health care provider about what schedule of regular examinations is right for you. This is a good chance for you to check in with your provider about disease prevention and staying healthy. In between checkups, there are plenty of things you can do on your own. Experts have done a lot of research about which lifestyle changes and preventive measures are most likely to keep you healthy. Ask your health care provider for more information. Weight and diet Eat a healthy diet  Be sure to include plenty of vegetables, fruits, low-fat dairy products, and lean protein.  Do not eat a lot of foods high in solid fats, added sugars, or salt.  Get regular exercise. This is one of the most important things you can do for your health.  Most adults should exercise for at least 150 minutes each week. The exercise should increase your heart rate and make you sweat (moderate-intensity exercise).  Most adults should also do strengthening exercises at least twice a week. This is in addition to the moderate-intensity exercise. Maintain a healthy weight  Body mass index (BMI) is a measurement that can be used to identify possible weight problems. It estimates body fat based on height and weight. Your health care provider can help determine your BMI and help you achieve or maintain a healthy weight.  For females 38 years of age and older:  A BMI below 18.5 is considered underweight.  A BMI of 18.5 to 24.9 is normal.  A BMI of 25 to 29.9 is considered overweight.  A BMI of 30 and above is considered obese. Watch levels of cholesterol and blood lipids  You should start having your blood tested for lipids and cholesterol at 80 years of age, then have this test every 5  years.  You may need to have your cholesterol levels checked more often if:  Your lipid or cholesterol levels are high.  You are older than 80 years of age.  You are at high risk for heart disease. Cancer screening Lung Cancer  Lung cancer screening is recommended for adults 51-51 years old who are at high risk for lung cancer because of a history of smoking.  A yearly low-dose CT scan of the lungs is recommended for people who:  Currently smoke.  Have quit within the past 15 years.  Have at least a 30-pack-year history of smoking. A pack year is smoking an average of one pack of cigarettes a day for 1 year.  Yearly screening should continue until it has been 15 years since you quit.  Yearly screening should stop if you develop a health problem that would prevent you from having lung cancer treatment. Breast Cancer  Practice breast self-awareness. This means understanding how your breasts normally appear and feel.  It also means doing regular breast self-exams. Let your health care provider know about any changes, no matter how small.  If you are in your 20s or 30s, you should have a clinical breast exam (CBE) by a health care provider every 1-3 years as part of a regular health exam.  If you are 87 or older, have a CBE every year. Also consider having a breast X-ray (mammogram) every year.  If you have a family history of breast cancer, talk to your  health care provider about genetic screening.  If you are at high risk for breast cancer, talk to your health care provider about having an MRI and a mammogram every year.  Breast cancer gene (BRCA) assessment is recommended for women who have family members with BRCA-related cancers. BRCA-related cancers include:  Breast.  Ovarian.  Tubal.  Peritoneal cancers.  Results of the assessment will determine the need for genetic counseling and BRCA1 and BRCA2 testing. Cervical Cancer  Your health care provider may recommend  that you be screened regularly for cancer of the pelvic organs (ovaries, uterus, and vagina). This screening involves a pelvic examination, including checking for microscopic changes to the surface of your cervix (Pap test). You may be encouraged to have this screening done every 3 years, beginning at age 21.  For women ages 30-65, health care providers may recommend pelvic exams and Pap testing every 3 years, or they may recommend the Pap and pelvic exam, combined with testing for human papilloma virus (HPV), every 5 years. Some types of HPV increase your risk of cervical cancer. Testing for HPV may also be done on women of any age with unclear Pap test results.  Other health care providers may not recommend any screening for nonpregnant women who are considered low risk for pelvic cancer and who do not have symptoms. Ask your health care provider if a screening pelvic exam is right for you.  If you have had past treatment for cervical cancer or a condition that could lead to cancer, you need Pap tests and screening for cancer for at least 20 years after your treatment. If Pap tests have been discontinued, your risk factors (such as having a new sexual partner) need to be reassessed to determine if screening should resume. Some women have medical problems that increase the chance of getting cervical cancer. In these cases, your health care provider may recommend more frequent screening and Pap tests. Colorectal Cancer  This type of cancer can be detected and often prevented.  Routine colorectal cancer screening usually begins at 80 years of age and continues through 80 years of age.  Your health care provider may recommend screening at an earlier age if you have risk factors for colon cancer.  Your health care provider may also recommend using home test kits to check for hidden blood in the stool.  A small camera at the end of a tube can be used to examine your colon directly (sigmoidoscopy or  colonoscopy). This is done to check for the earliest forms of colorectal cancer.  Routine screening usually begins at age 50.  Direct examination of the colon should be repeated every 5-10 years through 80 years of age. However, you may need to be screened more often if early forms of precancerous polyps or small growths are found. Skin Cancer  Check your skin from head to toe regularly.  Tell your health care provider about any new moles or changes in moles, especially if there is a change in a mole's shape or color.  Also tell your health care provider if you have a mole that is larger than the size of a pencil eraser.  Always use sunscreen. Apply sunscreen liberally and repeatedly throughout the day.  Protect yourself by wearing long sleeves, pants, a wide-brimmed hat, and sunglasses whenever you are outside. Heart disease, diabetes, and high blood pressure  High blood pressure causes heart disease and increases the risk of stroke. High blood pressure is more likely to develop in:    People who have blood pressure in the high end of the normal range (130-139/85-89 mm Hg).  People who are overweight or obese.  People who are African American.  If you are 60-50 years of age, have your blood pressure checked every 3-5 years. If you are 33 years of age or older, have your blood pressure checked every year. You should have your blood pressure measured twice-once when you are at a hospital or clinic, and once when you are not at a hospital or clinic. Record the average of the two measurements. To check your blood pressure when you are not at a hospital or clinic, you can use:  An automated blood pressure machine at a pharmacy.  A home blood pressure monitor.  If you are between 27 years and 42 years old, ask your health care provider if you should take aspirin to prevent strokes.  Have regular diabetes screenings. This involves taking a blood sample to check your fasting blood sugar  level.  If you are at a normal weight and have a low risk for diabetes, have this test once every three years after 80 years of age.  If you are overweight and have a high risk for diabetes, consider being tested at a younger age or more often. Preventing infection Hepatitis B  If you have a higher risk for hepatitis B, you should be screened for this virus. You are considered at high risk for hepatitis B if:  You were born in a country where hepatitis B is common. Ask your health care provider which countries are considered high risk.  Your parents were born in a high-risk country, and you have not been immunized against hepatitis B (hepatitis B vaccine).  You have HIV or AIDS.  You use needles to inject street drugs.  You live with someone who has hepatitis B.  You have had sex with someone who has hepatitis B.  You get hemodialysis treatment.  You take certain medicines for conditions, including cancer, organ transplantation, and autoimmune conditions. Hepatitis C  Blood testing is recommended for:  Everyone born from [birth year] through 1963.  Anyone with known risk factors for hepatitis C. Sexually transmitted infections (STIs)  You should be screened for sexually transmitted infections (STIs) including gonorrhea and chlamydia if:  You are sexually active and are younger than 80 years of age.  You are older than 80 years of age and your health care provider tells you that you are at risk for this type of infection.  Your sexual activity has changed since you were last screened and you are at an increased risk for chlamydia or gonorrhea. Ask your health care provider if you are at risk.  If you do not have HIV, but are at risk, it may be recommended that you take a prescription medicine daily to prevent HIV infection. This is called pre-exposure prophylaxis (PrEP). You are considered at risk if:  You are sexually active and do not regularly use condoms or know the HIV status  of your partner(s).  You take drugs by injection.  You are sexually active with a partner who has HIV. Talk with your health care provider about whether you are at high risk of being infected with HIV. If you choose to begin PrEP, you should first be tested for HIV. You should then be tested every 3 months for as long as you are taking PrEP. Pregnancy  If you are premenopausal and you may become pregnant, ask your health care provider about  preconception counseling.  If you may become pregnant, take 400 to 800 micrograms (mcg) of folic acid every day.  If you want to prevent pregnancy, talk to your health care provider about birth control (contraception). Osteoporosis and menopause  Osteoporosis is a disease in which the bones lose minerals and strength with aging. This can result in serious bone fractures. Your risk for osteoporosis can be identified using a bone density scan.  If you are 65 years of age or older, or if you are at risk for osteoporosis and fractures, ask your health care provider if you should be screened.  Ask your health care provider whether you should take a calcium or vitamin D supplement to lower your risk for osteoporosis.  Menopause may have certain physical symptoms and risks.  Hormone replacement therapy may reduce some of these symptoms and risks. Talk to your health care provider about whether hormone replacement therapy is right for you. Follow these instructions at home:  Schedule regular health, dental, and eye exams.  Stay current with your immunizations.  Do not use any tobacco products including cigarettes, chewing tobacco, or electronic cigarettes.  If you are pregnant, do not drink alcohol.  If you are breastfeeding, limit how much and how often you drink alcohol.  Limit alcohol intake to no more than 1 drink per day for nonpregnant women. One drink equals 12 ounces of beer, 5 ounces of wine, or 1 ounces of hard liquor.  Do not use street  drugs.  Do not share needles.  Ask your health care provider for help if you need support or information about quitting drugs.  Tell your health care provider if you often feel depressed.  Tell your health care provider if you have ever been abused or do not feel safe at home. This information is not intended to replace advice given to you by your health care provider. Make sure you discuss any questions you have with your health care provider. Document Released: 10/12/2010 Document Revised: 09/04/2015 Document Reviewed: 12/31/2014  2017 [employer]vier  

## 2016-05-25 NOTE — Assessment & Plan Note (Signed)
BP at goal on her lisinopril/hctz and amlodipine. Checking CMP and adjust as needed.

## 2016-05-26 ENCOUNTER — Other Ambulatory Visit: Payer: Self-pay | Admitting: Internal Medicine

## 2016-05-27 ENCOUNTER — Ambulatory Visit: Payer: Commercial Managed Care - HMO

## 2016-08-10 ENCOUNTER — Encounter: Payer: Self-pay | Admitting: Internal Medicine

## 2016-08-17 ENCOUNTER — Other Ambulatory Visit: Payer: Self-pay | Admitting: Internal Medicine

## 2016-09-15 ENCOUNTER — Ambulatory Visit: Payer: Medicare PPO | Admitting: Internal Medicine

## 2016-09-16 ENCOUNTER — Other Ambulatory Visit (INDEPENDENT_AMBULATORY_CARE_PROVIDER_SITE_OTHER): Payer: Medicare PPO

## 2016-09-16 ENCOUNTER — Ambulatory Visit (INDEPENDENT_AMBULATORY_CARE_PROVIDER_SITE_OTHER): Payer: Medicare PPO | Admitting: Internal Medicine

## 2016-09-16 ENCOUNTER — Encounter: Payer: Self-pay | Admitting: Internal Medicine

## 2016-09-16 VITALS — BP 124/70 | HR 63 | Temp 98.4°F | Resp 12 | Ht 61.0 in | Wt 161.0 lb

## 2016-09-16 DIAGNOSIS — E119 Type 2 diabetes mellitus without complications: Secondary | ICD-10-CM

## 2016-09-16 LAB — HEMOGLOBIN A1C: Hgb A1c MFr Bld: 8.7 % — ABNORMAL HIGH (ref 4.6–6.5)

## 2016-09-16 MED ORDER — PIOGLITAZONE HCL 30 MG PO TABS
30.0000 mg | ORAL_TABLET | Freq: Every day | ORAL | 1 refills | Status: DC
Start: 2016-09-16 — End: 2017-03-23

## 2016-09-16 NOTE — Patient Instructions (Signed)
We will increase the actos (pioglitazone) to 30 mg daily. You can take 2 pills daily until they are gone and then the new prescription will be stronger so you can just take 1 pill daily.

## 2016-09-17 NOTE — Assessment & Plan Note (Signed)
She is having some eye problems although she cannot tell the name of them so it is unclear if they are directly related to her diabetes although tight control around surgical procedure on her eye would likely be beneficial. Last HgA1c 8.1 and will recheck today given her concerns about worsening. Will increase actos to 30 mg daily from 15 mg daily. Continue metformin as well as amaryl. On statin and ACE-I.

## 2016-09-17 NOTE — Progress Notes (Signed)
   Subjective:    Patient ID: Whitney [name], female    DOB: 11-Sep-1934, 82 y.o.   MRN: [id number]  HPI The patient is a 82 YO female coming in for follow up of her diabetes. She feels that her sugars are running higher at home and this bothers her. Denies change in diet but not exercising well as she has had some eye problems. She is worried that her sugars are worsening her eye problems and is wanting to drop them some. Most morning sugars are 180-190, some around 220. She cannot place certain foods to the high sugars.   Review of Systems  Constitutional: Positive for activity change. Negative for appetite change, fatigue, fever and unexpected weight change.  Eyes: Positive for visual disturbance.  Respiratory: Negative.   Cardiovascular: Negative.   Gastrointestinal: Negative.   Musculoskeletal: Negative.   Skin: Negative.   Neurological: Negative.       Objective:   Physical Exam  Constitutional: She appears well-developed and well-nourished.  HENT:  Head: Normocephalic and atraumatic.  Eyes: EOM are normal.  Neck: Normal range of motion.  Cardiovascular: Normal rate and regular rhythm.   Pulmonary/Chest: Effort normal and breath sounds normal.  Abdominal: Soft. She exhibits no distension. There is no tenderness. There is no rebound.  Skin: Skin is warm and dry.   Vitals:   09/16/16 1024  BP: 124/70  Pulse: 63  Resp: 12  Temp: 98.4 F (36.9 C)  TempSrc: Oral  SpO2: 98%  Weight: 161 lb (73 kg)  Height: 5\' 1"  (1.549 m)      Assessment & Plan:

## 2016-10-08 ENCOUNTER — Encounter: Payer: Self-pay | Admitting: Internal Medicine

## 2016-10-22 ENCOUNTER — Other Ambulatory Visit: Payer: Self-pay | Admitting: Internal Medicine

## 2016-11-12 ENCOUNTER — Ambulatory Visit: Payer: Medicare PPO | Admitting: Internal Medicine

## 2016-11-12 ENCOUNTER — Other Ambulatory Visit: Payer: Self-pay

## 2016-11-12 MED ORDER — GLUCOSE BLOOD VI STRP: ORAL_STRIP | 12 refills | Status: DC

## 2016-11-12 MED ORDER — SIMVASTATIN 20 MG PO TABS
20.0000 mg | ORAL_TABLET | Freq: Every day | ORAL | 1 refills | Status: DC
Start: 2016-11-12 — End: 2017-05-18

## 2016-11-16 ENCOUNTER — Other Ambulatory Visit: Payer: Self-pay

## 2016-11-16 MED ORDER — LISINOPRIL-HYDROCHLOROTHIAZIDE 20-12.5 MG PO TABS: 1.0000 | ORAL_TABLET | Freq: Every day | ORAL | 0 refills | Status: DC

## 2016-11-16 MED ORDER — AMLODIPINE BESYLATE 10 MG PO TABS: 10.0000 mg | ORAL_TABLET | Freq: Every day | ORAL | 0 refills | Status: DC

## 2016-11-16 MED ORDER — METFORMIN HCL 1000 MG PO TABS: 1000.0000 mg | ORAL_TABLET | Freq: Two times a day (BID) | ORAL | 3 refills | Status: DC

## 2016-11-23 ENCOUNTER — Other Ambulatory Visit: Payer: Self-pay

## 2016-11-23 MED ORDER — METFORMIN HCL 1000 MG PO TABS: 1000.0000 mg | ORAL_TABLET | Freq: Two times a day (BID) | ORAL | 3 refills | Status: DC

## 2016-11-23 MED ORDER — LISINOPRIL-HYDROCHLOROTHIAZIDE 20-12.5 MG PO TABS: 1.0000 | ORAL_TABLET | Freq: Every day | ORAL | 0 refills | Status: DC

## 2016-11-23 MED ORDER — AMLODIPINE BESYLATE 10 MG PO TABS: 10.0000 mg | ORAL_TABLET | Freq: Every day | ORAL | 0 refills | Status: DC

## 2016-11-24 ENCOUNTER — Ambulatory Visit: Payer: Medicare PPO | Admitting: Internal Medicine

## 2016-11-25 LAB — HM DIABETES EYE EXAM

## 2016-11-29 ENCOUNTER — Encounter: Payer: Self-pay | Admitting: Internal Medicine

## 2016-11-29 NOTE — Progress Notes (Signed)
Abstracted and sent to scan  

## 2016-12-08 DIAGNOSIS — H903 Sensorineural hearing loss, bilateral: Secondary | ICD-10-CM | POA: Insufficient documentation

## 2017-01-04 ENCOUNTER — Other Ambulatory Visit: Payer: Self-pay | Admitting: Internal Medicine

## 2017-01-14 ENCOUNTER — Other Ambulatory Visit: Payer: Self-pay | Admitting: Internal Medicine

## 2017-01-14 DIAGNOSIS — Z1231 Encounter for screening mammogram for malignant neoplasm of breast: Secondary | ICD-10-CM

## 2017-01-25 ENCOUNTER — Ambulatory Visit
Admission: RE | Admit: 2017-01-25 | Discharge: 2017-01-25 | Disposition: A | Discharge status: Home / Self Care | Payer: Medicare PPO | Source: Ambulatory Visit | Attending: Internal Medicine | Admitting: Internal Medicine

## 2017-01-25 DIAGNOSIS — Z1231 Encounter for screening mammogram for malignant neoplasm of breast: Secondary | ICD-10-CM

## 2017-01-26 ENCOUNTER — Other Ambulatory Visit: Payer: Self-pay

## 2017-01-26 MED ORDER — LISINOPRIL-HYDROCHLOROTHIAZIDE 20-12.5 MG PO TABS: 1.0000 | ORAL_TABLET | Freq: Every day | ORAL | 0 refills | Status: DC

## 2017-01-26 MED ORDER — AMLODIPINE BESYLATE 10 MG PO TABS: 10.0000 mg | ORAL_TABLET | Freq: Every day | ORAL | 0 refills | Status: DC

## 2017-02-01 ENCOUNTER — Other Ambulatory Visit: Payer: Self-pay

## 2017-02-01 MED ORDER — METFORMIN HCL 1000 MG PO TABS: 1000.0000 mg | ORAL_TABLET | Freq: Two times a day (BID) | ORAL | 3 refills | Status: DC

## 2017-02-01 MED ORDER — LISINOPRIL-HYDROCHLOROTHIAZIDE 20-12.5 MG PO TABS: 1.0000 | ORAL_TABLET | Freq: Every day | ORAL | 1 refills | Status: DC

## 2017-02-01 MED ORDER — AMLODIPINE BESYLATE 10 MG PO TABS: 10.0000 mg | ORAL_TABLET | Freq: Every day | ORAL | 1 refills | Status: DC

## 2017-03-23 ENCOUNTER — Other Ambulatory Visit: Payer: Self-pay | Admitting: *Deleted

## 2017-03-23 ENCOUNTER — Telehealth: Payer: Self-pay | Admitting: Internal Medicine

## 2017-03-23 MED ORDER — PIOGLITAZONE HCL 30 MG PO TABS: 30.0000 mg | ORAL_TABLET | Freq: Every day | ORAL | 1 refills | Status: DC

## 2017-03-23 NOTE — Telephone Encounter (Signed)
Copied from [place] [phone]. Topic: Quick Communication - See Telephone Encounter >> Mar 23, 2017  9:23 AM Hewitt Shorts wrote: CRM for notification. See Telephone encounter for:  Pt is checking on status of actos refill request [place] is waiting on a response back from [place]   Best number [phone]  03/23/17.

## 2017-04-20 NOTE — Progress Notes (Signed)
 Subjective:   Whitney [name] is a 83 y.o. female who presents for Medicare Annual (Subsequent) preventive examination. Patient concerned about the increase swelling BLE and about her diabetes not being control. Review of Systems:  No ROS.  Medicare Wellness Visit. Additional risk factors are reflected in the social history.  Cardiac Risk Factors include: diabetes mellitus;dyslipidemia;hypertension;advanced age (>55men, >65 women) Sleep patterns: gets up 1-2 times nightly to void and sleeps 6-7 hours nightly.    Home Safety/Smoke Alarms: Feels safe in home. Smoke alarms in place.  Living environment; residence and Firearm Safety: 1-story house/ trailer, no firearms Lives alone, no needs for DME, good support system. Seat Belt Safety/Bike Helmet: Wears seat belt.     Objective:     Vitals: BP 132/76   Pulse 76   Resp 20   Ht 5' 1" (1.549 m)   Wt 168 lb (76.2 kg)   SpO2 98%   BMI 31.74 kg/m   Body mass index is 31.74 kg/m.  Advanced Directives 04/21/2017 04/15/2016 12/23/2015 12/12/2014 08/30/2014 08/30/2014  Does Patient Have a Medical Advance Directive? Yes Yes Yes Yes (No Data) Yes  Type of Advance Directive Healthcare Power of Attorney;Living will Living will;Healthcare Power of Attorney - Healthcare Power of Attorney;Living will - -  Does patient want to make changes to medical advance directive? - No - Patient declined - - - -  Copy of Healthcare Power of Attorney in Chart? No - copy requested No - copy requested - - - No - copy requested    Tobacco Social History   Tobacco Use  Smoking Status Never Smoker  Smokeless Tobacco Never Used     Counseling given: Not Answered   Past Medical History:  Diagnosis Date  . Anxiety   . Baker's cyst   . Chest pain   . Diabetes mellitus type 2, uncontrolled (HCC)   . Diabetes mellitus without complication (HCC)   . Disc disease, degenerative, cervical   . Diverticulosis of colon   . DJD (degenerative joint disease) of knee     . Facial pain, atypical   . GERD (gastroesophageal reflux disease)   . Glaucoma   . Hypercholesteremia   . Hypertension   . Lumbar spondylosis   . Obesity   . Onychomycosis   . Osteopenia    Past Surgical History:  Procedure Laterality Date  . right eye surgery  09/2010   retina in right eye  . VAGINAL HYSTERECTOMY  2002   Dr. Henley--AP repair   Family History  Problem Relation Age of Onset  . Diabetes Mother   . Colon cancer Neg Hx   . Stomach cancer Neg Hx    Social History   Socioeconomic History  . Marital status: Widowed    Spouse name: deceased 09/2008  . Number of children: None  . Years of education: None  . Highest education level: None  Social Needs  . Financial resource strain: Not very hard  . Food insecurity - worry: Never true  . Food insecurity - inability: Never true  . Transportation needs - medical: No  . Transportation needs - non-medical: No  Occupational History  . None  Tobacco Use  . Smoking status: Never Smoker  . Smokeless tobacco: Never Used  Substance and Sexual Activity  . Alcohol use: No  . Drug use: No  . Sexual activity: No  Other Topics Concern  . None  Social History Narrative   Lives alone   Spouse passed away 2010     Son;    3 girls   Clinical cytogeneticist;     Outpatient Encounter Medications as of 04/21/2017  Medication Sig  . amLODipine (NORVASC) 10 MG tablet Take 1 tablet (10 mg total) by mouth daily.  Marland Kitchen aspirin 81 MG tablet Take 1 tablet (81 mg total) by mouth daily.  Marland Kitchen atropine 1 % ophthalmic solution Place 1 drop into both eyes 4 (four) times daily.   . blood glucose meter kit and supplies KIT Use to test blood sugar up to 3 times daily. DX E11.9  . Calcium Carbonate-Vitamin D (CALCIUM-VITAMIN D) 500-200 MG-UNIT per tablet Take 1 tablet by mouth 2 (two) times daily with a meal.  . dorzolamide-timolol (COSOPT) 22.3-6.8 MG/ML ophthalmic solution Place 1 drop into both eyes 2 (two) times daily.   . DUREZOL 0.05 % EMUL   .  glimepiride (AMARYL) 4 MG tablet TAKE 1 TABLET EVERY DAY BEFORE BREAKFAST  . ketorolac (ACULAR) 0.4 % SOLN INSTILL 1 DROP INTO EACH EYE QID  . Lancet Devices (ACCU-CHEK SOFTCLIX) lancets Use to test blood sugar up to 3 times a day. DX E11.9  . latanoprost (XALATAN) 0.005 % ophthalmic solution Place 1 drop into both eyes at bedtime.   Marland Kitchen lisinopril-hydrochlorothiazide (PRINZIDE,ZESTORETIC) 20-12.5 MG tablet Take 1 tablet by mouth daily.  . metFORMIN (GLUCOPHAGE) 1000 MG tablet Take 1 tablet (1,000 mg total) by mouth 2 (two) times daily with a meal.  . Multiple Vitamin (MULTI VITAMIN DAILY PO) Take one by mouth daily  . pioglitazone (ACTOS) 30 MG tablet Take 1 tablet (30 mg total) by mouth daily.  . simvastatin (ZOCOR) 20 MG tablet Take 1 tablet (20 mg total) by mouth at bedtime.  Marland Kitchen glucose blood (COOL BLOOD GLUCOSE TEST STRIPS) test strip Use to test blood sugar up to 3 times daily. DX E11.9  . [DISCONTINUED] gentamicin-prednisoLONE 0.3-1 % ophthalmic drops 1 drop 2 (two) times daily.  . [DISCONTINUED] Omega-3 Fatty Acids (FISH OIL) 1000 MG CAPS Take 2 capsules by mouth every evening.  . [DISCONTINUED] prednisoLONE acetate (PRED FORTE) 1 % ophthalmic suspension 1 drop 4 (four) times daily.   No facility-administered encounter medications on file as of 04/21/2017.     Activities of Daily Living In your present state of health, do you have any difficulty performing the following activities: 04/21/2017  Hearing? Y  Vision? N  Difficulty concentrating or making decisions? N  Walking or climbing stairs? N  Dressing or bathing? N  Doing errands, shopping? N  Preparing Food and eating ? N  Using the Toilet? N  In the past six months, have you accidently leaked urine? N  Do you have problems with loss of bowel control? N  Managing your Medications? N  Managing your Finances? N  Housekeeping or managing your Housekeeping? N  Some recent data might be hidden    Patient Care Team: [name], MD as PCP - General (Internal Medicine) Noralee Space, MD as Consulting Physician (Pulmonary Disease) Gean Birchwood, DPM as Consulting Physician (Podiatry)    Assessment:   This is a routine wellness examination for Valisha. Physical assessment deferred to PCP.   Exercise Activities and Dietary recommendations Current Exercise Habits: Home exercise routine, Type of exercise: walking;calisthenics;stretching, Time (Minutes): 40, Frequency (Times/Week): 4, Weekly Exercise (Minutes/Week): 160, Intensity: Mild, Exercise limited by: None identified  Diet (meal preparation, eat out, water intake, caffeinated beverages, dairy products, fruits and vegetables): in general, a "healthy" diet     Reviewed heart healthy and diabetic diet,  encouraged patient to increase daily water intake. Diet education was attached to patient's AVS. Relevant patient education assigned to patient using Emmi. Nurse discussed in length diabetic diet and education.    Goals    . Patient Stated     Get my blood sugar under control. Start to count carbohydrates and sugar. Go to free diabetic support classes offered by [facility].        Fall Risk Fall Risk  04/21/2017 04/15/2016 12/23/2015 02/25/2015 12/12/2014  Falls in the past year? No Yes Yes No No  Number falls in past yr: - 1 1 - -  Injury with Fall? - No Yes - -  Risk for fall due to : - - Other (Comment) - -  Risk for fall due to: Comment - - missed a step - -  Follow up - Falls prevention discussed - - -    Depression Screen PHQ 2/9 Scores 04/21/2017 04/15/2016 12/23/2015 02/25/2015  PHQ - 2 Score 0 0 0 0  PHQ- 9 Score 0 - - -     Cognitive Function MMSE - Mini Mental State Exam 04/21/2017 08/30/2014  Not completed: - Unable to complete  Orientation to time 5 -  Orientation to Place 5 -  Registration 3 -  Attention/ Calculation 4 -  Recall 2 -  Language- name 2 objects 2 -  Language- repeat 1 -  Language- follow 3 step command 3 -    Language- read & follow direction 1 -  Write a sentence 1 -  Copy design 1 -  Total score 28 -        Immunization History  Administered Date(s) Administered  . Influenza Split 02/18/2011, 02/23/2012  . Influenza Whole 01/19/2005, 02/17/2009, 02/16/2010  . Influenza, High Dose Seasonal PF 02/14/2015, 04/21/2017  . Influenza,inj,Quad PF,6+ Mos 03/12/2013, 02/07/2014  . Pneumococcal Conjugate-13 05/25/2016  . Pneumococcal Polysaccharide-23 04/12/1998, 02/16/2010  . Td 08/21/2002  . Tdap 08/23/2011   Screening Tests Health Maintenance  Topic Date Due  . INFLUENZA VACCINE  11/10/2016  . FOOT EXAM  09/16/2017  . OPHTHALMOLOGY EXAM  11/25/2017  . TETANUS/TDAP  08/22/2021  . DEXA SCAN  Completed  . PNA vac Low Risk Adult  Completed      Plan:    PCP appointment scheduled 04/26/17 to discuss increased BLE swelling and diabetes.  Continue to eat heart healthy diet (full of fruits, vegetables, whole grains, lean protein, water--limit salt, fat, and sugar intake) and increase physical activity as tolerated.  Continue doing brain stimulating activities (puzzles, reading, adult coloring books, staying active) to keep memory sharp.   I have personally reviewed and noted the following in the patient's chart:   . Medical and social history . Use of alcohol, tobacco or illicit drugs  . Current medications and supplements . Functional ability and status . Nutritional status . Physical activity . Advanced directives . List of other physicians . Vitals . Screenings to include cognitive, depression, and falls . Referrals and appointments  In addition, I have reviewed and discussed with patient certain preventive protocols, quality metrics, and best practice recommendations. A written personalized care plan for preventive services as well as general preventive health recommendations were provided to patient.     Michiel Cowboy, RN  04/21/2017

## 2017-04-21 ENCOUNTER — Ambulatory Visit (INDEPENDENT_AMBULATORY_CARE_PROVIDER_SITE_OTHER): Payer: Medicare PPO | Admitting: *Deleted

## 2017-04-21 VITALS — BP 132/76 | HR 76 | Resp 20 | Ht 61.0 in | Wt 168.0 lb

## 2017-04-21 DIAGNOSIS — Z23 Encounter for immunization: Secondary | ICD-10-CM | POA: Diagnosis not present

## 2017-04-21 DIAGNOSIS — Z0000 Encounter for general adult medical examination without abnormal findings: Secondary | ICD-10-CM

## 2017-04-21 NOTE — Progress Notes (Signed)
Medical screening examination/treatment/procedure(s) were performed by non-physician practitioner and as supervising physician I was immediately available for consultation/collaboration. I agree with above. Elizabeth A Crawford, MD 

## 2017-04-21 NOTE — Patient Instructions (Addendum)
Diabetes Support Group for Anyone with Type 2 Diabetes Registration Open Ongoing Event See event details for scheduling information.  Nutrition and Diabetes Education Services at [facility], [place] This support group for Type 2 diabetics and their family members addresses a wide range of topics related to diabetes.  Registration Details Call [phone] for more information.  Fees & Payment This program is free.  Description Schedule & Location Related Events This is a free support group for individuals with Type 2 Diabetes and their family members. The group meets from 6 to 7 p.m. on the second Monday of each month. Check-in is at 5:45 p.m. The meetings are held at [place] and Diabetes Education at [place] located at [facility] at [employer], [place], fourth floor conference room.  Continue doing brain stimulating activities (puzzles, reading, adult coloring books, staying active) to keep memory sharp.   Continue to eat heart healthy diet (full of fruits, vegetables, whole grains, lean protein, water--limit salt, fat, and sugar intake) and increase physical activity as tolerated.   Whitney [name] , Thank you for taking time to come for your Medicare Wellness Visit. I appreciate your ongoing commitment to your health goals. Please review the following plan we discussed and let me know if I can assist you in the future.   These are the goals we discussed: Goals    . Patient Stated     Get my blood sugar under control. Start to count carbohydrates and sugar. Go to free diabetic support classes offered by [facility].        This is a list of the screening recommended for you and due dates:  Health Maintenance  Topic Date Due  . Flu Shot  11/10/2016  . Complete foot exam   09/16/2017  . Eye exam for diabetics  11/25/2017  . Tetanus Vaccine  08/22/2021  . DEXA scan (bone density measurement)  Completed  . Pneumonia vaccines   Completed    Carbohydrate Counting for Diabetes Mellitus, Adult Carbohydrate counting is a method for keeping track of how many carbohydrates you eat. Eating carbohydrates naturally increases the amount of sugar (glucose) in the blood. Counting how many carbohydrates you eat helps keep your blood glucose within normal limits, which helps you manage your diabetes (diabetes mellitus). It is important to know how many carbohydrates you can safely have in each meal. This is different for every person. A diet and nutrition specialist (registered dietitian) can help you make a meal plan and calculate how many carbohydrates you should have at each meal and snack. Carbohydrates are found in the following foods:  Grains, such as breads and cereals.  Dried beans and soy products.  Starchy vegetables, such as potatoes, peas, and corn.  Fruit and fruit juices.  Milk and yogurt.  Sweets and snack foods, such as cake, cookies, candy, chips, and soft drinks.  How do I count carbohydrates? There are two ways to count carbohydrates in food. You can use either of the methods or a combination of both. Reading "Nutrition Facts" on packaged food The "Nutrition Facts" list is included on the labels of almost all packaged foods and beverages in the [place] It includes:  The serving size.  Information about nutrients in each serving, including the grams (g) of carbohydrate per serving.  To use the "Nutrition Facts":  Decide how many servings you will have.  Multiply the number of servings by the number of carbohydrates per serving.  The resulting number  is the total amount of carbohydrates that you will be having.  Learning standard serving sizes of other foods When you eat foods containing carbohydrates that are not packaged or do not include "Nutrition Facts" on the label, you need to measure the servings in order to count the amount of carbohydrates:  Measure the foods that you will eat with a food  scale or measuring cup, if needed.  Decide how many standard-size servings you will eat.  Multiply the number of servings by 15. Most carbohydrate-rich foods have about 15 g of carbohydrates per serving. ? For example, if you eat 8 oz (170 g) of strawberries, you will have eaten 2 servings and 30 g of carbohydrates (2 servings x 15 g = 30 g).  For foods that have more than one food mixed, such as soups and casseroles, you must count the carbohydrates in each food that is included.  The following list contains standard serving sizes of common carbohydrate-rich foods. Each of these servings has about 15 g of carbohydrates:   hamburger bun or  English muffin.   oz (15 mL) syrup.   oz (14 g) jelly.  1 slice of bread.  1 six-inch tortilla.  3 oz (85 g) cooked rice or pasta.  4 oz (113 g) cooked dried beans.  4 oz (113 g) starchy vegetable, such as peas, corn, or potatoes.  4 oz (113 g) hot cereal.  4 oz (113 g) mashed potatoes or  of a large baked potato.  4 oz (113 g) canned or frozen fruit.  4 oz (120 mL) fruit juice.  4-6 crackers.  6 chicken nuggets.  6 oz (170 g) unsweetened dry cereal.  6 oz (170 g) plain fat-free yogurt or yogurt sweetened with artificial sweeteners.  8 oz (240 mL) milk.  8 oz (170 g) fresh fruit or one small piece of fruit.  24 oz (680 g) popped popcorn.  Example of carbohydrate counting Sample meal  3 oz (85 g) chicken breast.  6 oz (170 g) brown rice.  4 oz (113 g) corn.  8 oz (240 mL) milk.  8 oz (170 g) strawberries with sugar-free whipped topping. Carbohydrate calculation 1. Identify the foods that contain carbohydrates: ? Rice. ? Corn. ? Milk. ? Strawberries. 2. Calculate how many servings you have of each food: ? 2 servings rice. ? 1 serving corn. ? 1 serving milk. ? 1 serving strawberries. 3. Multiply each number of servings by 15 g: ? 2 servings rice x 15 g = 30 g. ? 1 serving corn x 15 g = 15 g. ? 1 serving  milk x 15 g = 15 g. ? 1 serving strawberries x 15 g = 15 g. 4. Add together all of the amounts to find the total grams of carbohydrates eaten: ? 30 g + 15 g + 15 g + 15 g = 75 g of carbohydrates total. This information is not intended to replace advice given to you by your health care provider. Make sure you discuss any questions you have with your health care provider. Document Released: 03/29/2005 Document Revised: 10/17/2015 Document Reviewed: 09/10/2015 Elsevier Interactive Patient Education  2018 [employer].  Diabetes Mellitus and Exercise Exercising regularly is important for your overall health, especially when you have diabetes (diabetes mellitus). Exercising is not only about losing weight. It has many health benefits, such as increasing muscle strength and bone density and reducing body fat and stress. This leads to improved fitness, flexibility, and endurance, all of which  result in better overall health. Exercise has additional benefits for people with diabetes, including:  Reducing appetite.  Helping to lower and control blood glucose.  Lowering blood pressure.  Helping to control amounts of fatty substances (lipids) in the blood, such as cholesterol and triglycerides.  Helping the body to respond better to insulin (improving insulin sensitivity).  Reducing how much insulin the body needs.  Decreasing the risk for heart disease by: ? Lowering cholesterol and triglyceride levels. ? Increasing the levels of good cholesterol. ? Lowering blood glucose levels.  What is my activity plan? Your health care provider or certified diabetes educator can help you make a plan for the type and frequency of exercise (activity plan) that works for you. Make sure that you:  Do at least 150 minutes of moderate-intensity or vigorous-intensity exercise each week. This could be brisk walking, biking, or water aerobics. ? Do stretching and strength exercises, such as yoga or weightlifting,  at least 2 times a week. ? Spread out your activity over at least 3 days of the week.  Get some form of physical activity every day. ? Do not go more than 2 days in a row without some kind of physical activity. ? Avoid being inactive for more than 90 minutes at a time. Take frequent breaks to walk or stretch.  Choose a type of exercise or activity that you enjoy, and set realistic goals.  Start slowly, and gradually increase the intensity of your exercise over time.  What do I need to know about managing my diabetes?  Check your blood glucose before and after exercising. ? If your blood glucose is higher than 240 mg/dL (13.3 mmol/L) before you exercise, check your urine for ketones. If you have ketones in your urine, do not exercise until your blood glucose returns to normal.  Know the symptoms of low blood glucose (hypoglycemia) and how to treat it. Your risk for hypoglycemia increases during and after exercise. Common symptoms of hypoglycemia can include: ? Hunger. ? Anxiety. ? Sweating and feeling clammy. ? Confusion. ? Dizziness or feeling light-headed. ? Increased heart rate or palpitations. ? Blurry vision. ? Tingling or numbness around the mouth, lips, or tongue. ? Tremors or shakes. ? Irritability.  Keep a rapid-acting carbohydrate snack available before, during, and after exercise to help prevent or treat hypoglycemia.  Avoid injecting insulin into areas of the body that are going to be exercised. For example, avoid injecting insulin into: ? The arms, when playing tennis. ? The legs, when jogging.  Keep records of your exercise habits. Doing this can help you and your health care provider adjust your diabetes management plan as needed. Write down: ? Food that you eat before and after you exercise. ? Blood glucose levels before and after you exercise. ? The type and amount of exercise you have done. ? When your insulin is expected to peak, if you use insulin. Avoid  exercising at times when your insulin is peaking.  When you start a new exercise or activity, work with your health care provider to make sure the activity is safe for you, and to adjust your insulin, medicines, or food intake as needed.  Drink plenty of water while you exercise to prevent dehydration or heat stroke. Drink enough fluid to keep your urine clear or pale yellow. This information is not intended to replace advice given to you by your health care provider. Make sure you discuss any questions you have with your health care provider. Document Released:  06/19/2003 Document Revised: 10/17/2015 Document Reviewed: 09/08/2015 Elsevier Interactive Patient Education  [employer].

## 2017-04-26 ENCOUNTER — Encounter: Payer: Self-pay | Admitting: Internal Medicine

## 2017-04-26 ENCOUNTER — Other Ambulatory Visit (INDEPENDENT_AMBULATORY_CARE_PROVIDER_SITE_OTHER): Payer: Medicare PPO

## 2017-04-26 ENCOUNTER — Ambulatory Visit: Payer: Medicare PPO | Admitting: Internal Medicine

## 2017-04-26 VITALS — BP 120/80 | HR 60 | Temp 97.9°F | Ht 61.0 in | Wt 169.0 lb

## 2017-04-26 DIAGNOSIS — E119 Type 2 diabetes mellitus without complications: Secondary | ICD-10-CM | POA: Diagnosis not present

## 2017-04-26 DIAGNOSIS — I10 Essential (primary) hypertension: Secondary | ICD-10-CM

## 2017-04-26 DIAGNOSIS — I872 Venous insufficiency (chronic) (peripheral): Secondary | ICD-10-CM | POA: Diagnosis not present

## 2017-04-26 LAB — CBC
HCT: 40.8 % (ref 36.0–46.0)
Hemoglobin: 13.2 g/dL (ref 12.0–15.0)
MCHC: 32.3 g/dL (ref 30.0–36.0)
MCV: 83.7 fl (ref 78.0–100.0)
Platelets: 207 10*3/uL (ref 150.0–400.0)
RBC: 4.87 Mil/uL (ref 3.87–5.11)
RDW: 14.4 % (ref 11.5–15.5)
WBC: 5.3 10*3/uL (ref 4.0–10.5)

## 2017-04-26 LAB — LIPID PANEL
Cholesterol: 129 mg/dL (ref 0–200)
HDL: 39.1 mg/dL (ref >39.00)
LDL Cholesterol: 70 mg/dL (ref 0–99)
NonHDL: 89.94
Total CHOL/HDL Ratio: 3
Triglycerides: 98 mg/dL (ref 0.0–149.0)
VLDL: 19.6 mg/dL (ref 0.0–40.0)

## 2017-04-26 LAB — COMPREHENSIVE METABOLIC PANEL
ALT: 28 U/L (ref 0–35)
AST: 28 U/L (ref 0–37)
Albumin: 4.2 g/dL (ref 3.5–5.2)
Alkaline Phosphatase: 46 U/L (ref 39–117)
BUN: 9 mg/dL (ref 6–23)
CO2: 29 mEq/L (ref 19–32)
Calcium: 10.1 mg/dL (ref 8.4–10.5)
Chloride: 103 mEq/L (ref 96–112)
Creatinine, Ser: 0.69 mg/dL (ref 0.40–1.20)
GFR: 105.22 mL/min (ref >60.00)
Glucose, Bld: 166 mg/dL — ABNORMAL HIGH (ref 70–99)
Potassium: 4.5 mEq/L (ref 3.5–5.1)
Sodium: 140 mEq/L (ref 135–145)
Total Bilirubin: 0.7 mg/dL (ref 0.2–1.2)
Total Protein: 7.9 g/dL (ref 6.0–8.3)

## 2017-04-26 LAB — HEMOGLOBIN A1C: Hgb A1c MFr Bld: 7.7 % — ABNORMAL HIGH (ref 4.6–6.5)

## 2017-04-26 NOTE — Patient Instructions (Signed)
We will check the labs today.   We will have you stop taking amlodipine (norvasc) for 2 weeks. If you notice the swelling is better (less) call us and let us know. Stay off the medicine. If you do not notice any difference start taking amlodipine (norvasc) again.

## 2017-04-26 NOTE — Assessment & Plan Note (Signed)
Taking metformin, amaryl and actos. Checking HgA1c and adjust as needed. 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 Up to date on eye exam and most recent without findings. She does not have neuropathy.

## 2017-04-26 NOTE — Progress Notes (Signed)
   Subjective:    Patient ID: Whitney [name], female    DOB: [birth year], 83 y.o.   MRN: [id number]  HPI The patient is an 83 YO female coming in for several concerns including her diabetes (feels sugars are up, around 140-160 fasting lately, still taking amaryl and metformin and actos, denies new numbness or tingling in her feet or hands, on ACE-I and statin, has had retinopathy in the past but none on most recent eye exam), and her leg swelling (bilateral and stable from prior, sometimes when she eats differently she gets more or less swelling, does use compression stockings which help some, she is wondering why it is happening) and her blood pressure (BP at goal on her amlodipine, lisinopril/hctz, denies headaches or chest pains, denies SOB or side effects).   Review of Systems  Constitutional: Negative.   HENT: Negative.   Eyes: Negative.   Respiratory: Negative for cough, chest tightness and shortness of breath.   Cardiovascular: Positive for leg swelling. Negative for chest pain and palpitations.  Gastrointestinal: Negative for abdominal distention, abdominal pain, constipation, diarrhea, nausea and vomiting.  Endocrine: Negative for polydipsia, polyphagia and polyuria.  Musculoskeletal: Negative.   Skin: Negative.   Neurological: Negative.   Psychiatric/Behavioral: Negative.       Objective:   Physical Exam  Constitutional: She is oriented to person, place, and time. She appears well-developed and well-nourished.  HENT:  Head: Normocephalic and atraumatic.  Eyes: EOM are normal.  Neck: Normal range of motion.  Cardiovascular: Normal rate and regular rhythm.  Pulmonary/Chest: Effort normal and breath sounds normal. No respiratory distress. She has no wheezes. She has no rales.  Abdominal: Soft. Bowel sounds are normal. She exhibits no distension. There is no tenderness. There is no rebound.  Musculoskeletal:  Trace edema in both feet, negligible  Neurological: She is alert and  oriented to person, place, and time. Coordination normal.  Skin: Skin is warm and dry.  Psychiatric: She has a normal mood and affect.   Vitals:   04/26/17 0841  BP: 120/80  Pulse: 60  Temp: 97.9 F (36.6 C)  TempSrc: Oral  SpO2: 100%  Weight: 169 lb (76.7 kg)  Height: 5\' 1"  (1.549 m)      Assessment & Plan:

## 2017-04-26 NOTE — Assessment & Plan Note (Signed)
Will hold amlodipine for 2 weeks as possible cause of swelling and if no change resume amlodipine. If decrease in swelling will D/C and adjust BP meds if needed.

## 2017-04-26 NOTE — Assessment & Plan Note (Signed)
BP at goal today, will temporarily hold amlodipine and continue lisinopril/hctz. She can have dose increase of lisinopril/hctz if she needs to stop amlodipine. Checking CMP and adjust as needed.

## 2017-05-10 ENCOUNTER — Encounter: Payer: Self-pay | Admitting: Internal Medicine

## 2017-05-18 ENCOUNTER — Other Ambulatory Visit: Payer: Self-pay

## 2017-05-18 MED ORDER — SIMVASTATIN 20 MG PO TABS: 20.0000 mg | ORAL_TABLET | Freq: Every day | ORAL | 1 refills | Status: DC

## 2017-05-27 ENCOUNTER — Telehealth: Payer: Self-pay | Admitting: Internal Medicine

## 2017-05-27 MED ORDER — GLIMEPIRIDE 4 MG PO TABS: ORAL_TABLET | ORAL | 3 refills | Status: DC

## 2017-05-27 MED ORDER — SIMVASTATIN 20 MG PO TABS: 20.0000 mg | ORAL_TABLET | Freq: Every day | ORAL | 1 refills | Status: DC

## 2017-05-27 MED ORDER — METFORMIN HCL 1000 MG PO TABS: 1000.0000 mg | ORAL_TABLET | Freq: Two times a day (BID) | ORAL | 3 refills | Status: DC

## 2017-05-27 MED ORDER — LISINOPRIL-HYDROCHLOROTHIAZIDE 20-12.5 MG PO TABS: 1.0000 | ORAL_TABLET | Freq: Every day | ORAL | 1 refills | Status: DC

## 2017-05-27 MED ORDER — PIOGLITAZONE HCL 30 MG PO TABS: 30.0000 mg | ORAL_TABLET | Freq: Every day | ORAL | 1 refills | Status: DC

## 2017-05-27 NOTE — Telephone Encounter (Signed)
Ok

## 2017-05-27 NOTE — Telephone Encounter (Signed)
Patient called to clarify which medication she needs refilled and sent to [facility], she says: Pioglitazone, Simvastatin, Glimepiride, Metformin, Lisinopril-HCTZ, Ketorolac eye drops, and Cosopt eye drops. I advised these pills will be refilled through [employer], and the doctor will be sent the request for the eye drops to be filled, since they were filled by another provider, she verbalized understanding. Last OV 04/26/17 Ketoralac last filled 05/05/15; Cosopt last filled 12/24/14 both by another provider.

## 2017-05-27 NOTE — Telephone Encounter (Signed)
We do not fill her eye drops and she will need to contact her eye specialist.

## 2017-05-27 NOTE — Telephone Encounter (Signed)
Copied from [place] [phone]. Topic: Quick Communication - See Telephone Encounter >> May 27, 2017 10:10 AM Boyd Kerbs wrote: CRM for notification. See Telephone encounter for:   Pt. Called all her medication was suppose to be sent to [facility] ,   She was in last month and told office to switch her pharmacy.. This was not done.  She only has enough medicine for a week and half and it takes 2 weeks to get medicine from mail order.  She is no longer use [employer]  She is asking to resend all prescriptions to [facility]:   [place] [place] [address] Phone: [phone] Fax: [phone]

## 2017-05-27 NOTE — Telephone Encounter (Signed)
Notified pt w/MD response. Pt also want to let MD know that the selling has went down in her feet/ankles. She have not started back on the amlodipine, but Bp has been running 05/20/17 142/68, 05/21/16 163/69, 05/22/17 162/73 recheck that after noon 141/63, 05/26/17 145/74, and this am 154/66...[name]

## 2017-05-30 NOTE — Telephone Encounter (Signed)
Average is okay so continue as is.

## 2017-05-30 NOTE — Telephone Encounter (Signed)
Notified pt w/MD response.../lmb 

## 2017-05-30 NOTE — Telephone Encounter (Signed)
Pls advise if pt need to start back taking the amlodipine or will something else need to be rx since BP has been running high. Pt states she is only taking the Lisinopril 20/12.5 mg../lmb

## 2017-05-31 DIAGNOSIS — E113311 Type 2 diabetes mellitus with moderate nonproliferative diabetic retinopathy with macular edema, right eye: Secondary | ICD-10-CM | POA: Diagnosis not present

## 2017-05-31 DIAGNOSIS — H43811 Vitreous degeneration, right eye: Secondary | ICD-10-CM | POA: Diagnosis not present

## 2017-05-31 DIAGNOSIS — H35041 Retinal micro-aneurysms, unspecified, right eye: Secondary | ICD-10-CM | POA: Diagnosis not present

## 2017-05-31 DIAGNOSIS — E113312 Type 2 diabetes mellitus with moderate nonproliferative diabetic retinopathy with macular edema, left eye: Secondary | ICD-10-CM | POA: Diagnosis not present

## 2017-06-07 DIAGNOSIS — E113311 Type 2 diabetes mellitus with moderate nonproliferative diabetic retinopathy with macular edema, right eye: Secondary | ICD-10-CM | POA: Diagnosis not present

## 2017-06-07 DIAGNOSIS — E113312 Type 2 diabetes mellitus with moderate nonproliferative diabetic retinopathy with macular edema, left eye: Secondary | ICD-10-CM | POA: Diagnosis not present

## 2017-07-06 DIAGNOSIS — D179 Benign lipomatous neoplasm, unspecified: Secondary | ICD-10-CM | POA: Diagnosis not present

## 2017-07-06 DIAGNOSIS — Z1272 Encounter for screening for malignant neoplasm of vagina: Secondary | ICD-10-CM | POA: Diagnosis not present

## 2017-07-12 DIAGNOSIS — E113311 Type 2 diabetes mellitus with moderate nonproliferative diabetic retinopathy with macular edema, right eye: Secondary | ICD-10-CM | POA: Diagnosis not present

## 2017-07-12 DIAGNOSIS — H35072 Retinal telangiectasis, left eye: Secondary | ICD-10-CM | POA: Diagnosis not present

## 2017-07-12 DIAGNOSIS — E113312 Type 2 diabetes mellitus with moderate nonproliferative diabetic retinopathy with macular edema, left eye: Secondary | ICD-10-CM | POA: Diagnosis not present

## 2017-07-12 DIAGNOSIS — H35071 Retinal telangiectasis, right eye: Secondary | ICD-10-CM | POA: Diagnosis not present

## 2017-07-12 LAB — HM DIABETES EYE EXAM

## 2017-07-13 ENCOUNTER — Encounter: Payer: Self-pay | Admitting: Internal Medicine

## 2017-07-18 ENCOUNTER — Ambulatory Visit (INDEPENDENT_AMBULATORY_CARE_PROVIDER_SITE_OTHER): Payer: Medicare HMO | Admitting: Internal Medicine

## 2017-07-18 ENCOUNTER — Encounter: Payer: Self-pay | Admitting: Internal Medicine

## 2017-07-18 VITALS — BP 130/68 | HR 90 | Temp 97.9°F | Ht 61.0 in | Wt 168.0 lb

## 2017-07-18 DIAGNOSIS — R0683 Snoring: Secondary | ICD-10-CM | POA: Diagnosis not present

## 2017-07-18 NOTE — Progress Notes (Signed)
   Subjective:    Patient ID: Whitney [name], female    DOB: 1934/10/25, 83 y.o.   MRN: [id number]  HPI The patient is an 83 YO female coming in at the request of her eye specialist. He is treating her eyes which are having some problems and he was concerned about her having sleep apnea. She has been told by her children that she snores but denies ever being tested. Does get tired during the day.   Review of Systems  Constitutional: Positive for fatigue.  Respiratory: Negative for cough, chest tightness and shortness of breath.        Snoring  Cardiovascular: Negative for chest pain, palpitations and leg swelling.  Gastrointestinal: Negative for abdominal distention, abdominal pain, constipation, diarrhea, nausea and vomiting.  Musculoskeletal: Negative.   Skin: Negative.   Neurological: Negative.       Objective:   Physical Exam  Constitutional: She is oriented to person, place, and time. She appears well-developed and well-nourished.  HENT:  Head: Normocephalic and atraumatic.  Eyes: EOM are normal.  Neck: Normal range of motion.  Cardiovascular: Normal rate and regular rhythm.  Pulmonary/Chest: Effort normal and breath sounds normal. No respiratory distress. She has no wheezes. She has no rales.  Abdominal: Soft.  Musculoskeletal: She exhibits no edema.  Neurological: She is alert and oriented to person, place, and time. Coordination normal.  Skin: Skin is warm and dry.   Vitals:   07/18/17 1520  BP: (!) 150/70  Pulse: 90  Temp: 97.9 F (36.6 C)  TempSrc: Oral  SpO2: 96%  Weight: 168 lb (76.2 kg)  Height: 5\' 1"  (1.549 m)      Assessment & Plan:

## 2017-07-18 NOTE — Patient Instructions (Signed)
We will get the home sleep test ordered so you will get a call about it to arrange it.

## 2017-07-20 DIAGNOSIS — R0683 Snoring: Principal | ICD-10-CM

## 2017-07-20 DIAGNOSIS — Z9989 Dependence on other enabling machines and devices: Secondary | ICD-10-CM | POA: Insufficient documentation

## 2017-07-20 DIAGNOSIS — G4733 Obstructive sleep apnea (adult) (pediatric): Secondary | ICD-10-CM | POA: Insufficient documentation

## 2017-07-20 NOTE — Assessment & Plan Note (Signed)
Referral for home sleep test to evaluate for OSA.

## 2017-07-26 ENCOUNTER — Other Ambulatory Visit (INDEPENDENT_AMBULATORY_CARE_PROVIDER_SITE_OTHER): Payer: Medicare HMO

## 2017-07-26 ENCOUNTER — Encounter: Payer: Self-pay | Admitting: Internal Medicine

## 2017-07-26 ENCOUNTER — Ambulatory Visit (INDEPENDENT_AMBULATORY_CARE_PROVIDER_SITE_OTHER): Payer: Medicare HMO | Admitting: Internal Medicine

## 2017-07-26 VITALS — BP 120/90 | HR 65 | Temp 97.8°F | Ht 61.0 in | Wt 167.0 lb

## 2017-07-26 DIAGNOSIS — I10 Essential (primary) hypertension: Secondary | ICD-10-CM

## 2017-07-26 DIAGNOSIS — E1169 Type 2 diabetes mellitus with other specified complication: Secondary | ICD-10-CM

## 2017-07-26 DIAGNOSIS — H9190 Unspecified hearing loss, unspecified ear: Secondary | ICD-10-CM

## 2017-07-26 DIAGNOSIS — Z0000 Encounter for general adult medical examination without abnormal findings: Secondary | ICD-10-CM

## 2017-07-26 DIAGNOSIS — E785 Hyperlipidemia, unspecified: Secondary | ICD-10-CM

## 2017-07-26 DIAGNOSIS — Z6831 Body mass index (BMI) 31.0-31.9, adult: Secondary | ICD-10-CM | POA: Diagnosis not present

## 2017-07-26 DIAGNOSIS — E6609 Other obesity due to excess calories: Secondary | ICD-10-CM | POA: Diagnosis not present

## 2017-07-26 LAB — COMPREHENSIVE METABOLIC PANEL
ALT: 25 U/L (ref 0–35)
AST: 24 U/L (ref 0–37)
Albumin: 4 g/dL (ref 3.5–5.2)
Alkaline Phosphatase: 44 U/L (ref 39–117)
BUN: 10 mg/dL (ref 6–23)
CO2: 29 mEq/L (ref 19–32)
Calcium: 10.2 mg/dL (ref 8.4–10.5)
Chloride: 103 mEq/L (ref 96–112)
Creatinine, Ser: 0.68 mg/dL (ref 0.40–1.20)
GFR: 106.94 mL/min (ref >60.00)
Glucose, Bld: 141 mg/dL — ABNORMAL HIGH (ref 70–99)
Potassium: 3.7 mEq/L (ref 3.5–5.1)
Sodium: 140 mEq/L (ref 135–145)
Total Bilirubin: 1 mg/dL (ref 0.2–1.2)
Total Protein: 7.5 g/dL (ref 6.0–8.3)

## 2017-07-26 LAB — HEMOGLOBIN A1C: Hgb A1c MFr Bld: 7.4 % — ABNORMAL HIGH (ref 4.6–6.5)

## 2017-07-26 MED ORDER — PIOGLITAZONE HCL 30 MG PO TABS: 30.0000 mg | ORAL_TABLET | Freq: Every day | ORAL | 3 refills | Status: DC

## 2017-07-26 MED ORDER — LISINOPRIL-HYDROCHLOROTHIAZIDE 20-12.5 MG PO TABS: 1.0000 | ORAL_TABLET | Freq: Every day | ORAL | 3 refills | Status: DC

## 2017-07-26 MED ORDER — AMLODIPINE BESYLATE 10 MG PO TABS: 10.0000 mg | ORAL_TABLET | Freq: Every day | ORAL | 3 refills | Status: DC

## 2017-07-26 MED ORDER — SIMVASTATIN 20 MG PO TABS: 20.0000 mg | ORAL_TABLET | Freq: Every day | ORAL | 3 refills | Status: DC

## 2017-07-26 MED ORDER — GLUCOSE BLOOD VI STRP: ORAL_STRIP | 12 refills | Status: DC

## 2017-07-26 NOTE — Assessment & Plan Note (Signed)
Referral to audiology for hearing aid adjustment. Aged out of colonoscopy and mammogram. Flu reminded. Pneumonia and tetanus up to date. Counseled about shingrix. Dexa okay. Counseled about sun safety and mole surveillance. Given 10 year screening recommendations.

## 2017-07-26 NOTE — Assessment & Plan Note (Signed)
Taking simvastatin, recent lipid panel at goal of LDL <70.

## 2017-07-26 NOTE — Assessment & Plan Note (Signed)
Stable, not exercising lately and encouraged to resume.

## 2017-07-26 NOTE — Patient Instructions (Signed)
We are checking the labs today and will get you in with the hearing aid specialists to check them.   Health Maintenance, Female Adopting a healthy lifestyle and getting preventive care can go a long way to promote health and wellness. Talk with your health care provider about what schedule of regular examinations is right for you. This is a good chance for you to check in with your provider about disease prevention and staying healthy. In between checkups, there are plenty of things you can do on your own. Experts have done a lot of research about which lifestyle changes and preventive measures are most likely to keep you healthy. Ask your health care provider for more information. Weight and diet Eat a healthy diet  Be sure to include plenty of vegetables, fruits, low-fat dairy products, and lean protein.  Do not eat a lot of foods high in solid fats, added sugars, or salt.  Get regular exercise. This is one of the most important things you can do for your health. ? Most adults should exercise for at least 150 minutes each week. The exercise should increase your heart rate and make you sweat (moderate-intensity exercise). ? Most adults should also do strengthening exercises at least twice a week. This is in addition to the moderate-intensity exercise.  Maintain a healthy weight  Body mass index (BMI) is a measurement that can be used to identify possible weight problems. It estimates body fat based on height and weight. Your health care provider can help determine your BMI and help you achieve or maintain a healthy weight.  For females 65 years of age and older: ? A BMI below 18.5 is considered underweight. ? A BMI of 18.5 to 24.9 is normal. ? A BMI of 25 to 29.9 is considered overweight. ? A BMI of 30 and above is considered obese.  Watch levels of cholesterol and blood lipids  You should start having your blood tested for lipids and cholesterol at 81 years of age, then have this test  every 5 years.  You may need to have your cholesterol levels checked more often if: ? Your lipid or cholesterol levels are high. ? You are older than 81 years of age. ? You are at high risk for heart disease.  Cancer screening Lung Cancer  Lung cancer screening is recommended for adults 58-69 years old who are at high risk for lung cancer because of a history of smoking.  A yearly low-dose CT scan of the lungs is recommended for people who: ? Currently smoke. ? Have quit within the past 15 years. ? Have at least a 30-pack-year history of smoking. A pack year is smoking an average of one pack of cigarettes a day for 1 year.  Yearly screening should continue until it has been 15 years since you quit.  Yearly screening should stop if you develop a health problem that would prevent you from having lung cancer treatment.  Breast Cancer  Practice breast self-awareness. This means understanding how your breasts normally appear and feel.  It also means doing regular breast self-exams. Let your health care provider know about any changes, no matter how small.  If you are in your 20s or 30s, you should have a clinical breast exam (CBE) by a health care provider every 1-3 years as part of a regular health exam.  If you are 30 or older, have a CBE every year. Also consider having a breast X-ray (mammogram) every year.  If you have a  family history of breast cancer, talk to your health care provider about genetic screening.  If you are at high risk for breast cancer, talk to your health care provider about having an MRI and a mammogram every year.  Breast cancer gene (BRCA) assessment is recommended for women who have family members with BRCA-related cancers. BRCA-related cancers include: ? Breast. ? Ovarian. ? Tubal. ? Peritoneal cancers.  Results of the assessment will determine the need for genetic counseling and BRCA1 and BRCA2 testing.  Cervical Cancer Your health care provider  may recommend that you be screened regularly for cancer of the pelvic organs (ovaries, uterus, and vagina). This screening involves a pelvic examination, including checking for microscopic changes to the surface of your cervix (Pap test). You may be encouraged to have this screening done every 3 years, beginning at age 50.  For women ages 60-65, health care providers may recommend pelvic exams and Pap testing every 3 years, or they may recommend the Pap and pelvic exam, combined with testing for human papilloma virus (HPV), every 5 years. Some types of HPV increase your risk of cervical cancer. Testing for HPV may also be done on women of any age with unclear Pap test results.  Other health care providers may not recommend any screening for nonpregnant women who are considered low risk for pelvic cancer and who do not have symptoms. Ask your health care provider if a screening pelvic exam is right for you.  If you have had past treatment for cervical cancer or a condition that could lead to cancer, you need Pap tests and screening for cancer for at least 20 years after your treatment. If Pap tests have been discontinued, your risk factors (such as having a new sexual partner) need to be reassessed to determine if screening should resume. Some women have medical problems that increase the chance of getting cervical cancer. In these cases, your health care provider may recommend more frequent screening and Pap tests.  Colorectal Cancer  This type of cancer can be detected and often prevented.  Routine colorectal cancer screening usually begins at 81 years of age and continues through 81 years of age.  Your health care provider may recommend screening at an earlier age if you have risk factors for colon cancer.  Your health care provider may also recommend using home test kits to check for hidden blood in the stool.  A small camera at the end of a tube can be used to examine your colon directly  (sigmoidoscopy or colonoscopy). This is done to check for the earliest forms of colorectal cancer.  Routine screening usually begins at age 58.  Direct examination of the colon should be repeated every 5-10 years through 81 years of age. However, you may need to be screened more often if early forms of precancerous polyps or small growths are found.  Skin Cancer  Check your skin from head to toe regularly.  Tell your health care provider about any new moles or changes in moles, especially if there is a change in a mole's shape or color.  Also tell your health care provider if you have a mole that is larger than the size of a pencil eraser.  Always use sunscreen. Apply sunscreen liberally and repeatedly throughout the day.  Protect yourself by wearing long sleeves, pants, a wide-brimmed hat, and sunglasses whenever you are outside.  Heart disease, diabetes, and high blood pressure  High blood pressure causes heart disease and increases the risk of  stroke. High blood pressure is more likely to develop in: ? People who have blood pressure in the high end of the normal range (130-139/85-89 mm Hg). ? People who are overweight or obese. ? People who are African American.  If you are 32-32 years of age, have your blood pressure checked every 3-5 years. If you are 79 years of age or older, have your blood pressure checked every year. You should have your blood pressure measured twice-once when you are at a hospital or clinic, and once when you are not at a hospital or clinic. Record the average of the two measurements. To check your blood pressure when you are not at a hospital or clinic, you can use: ? An automated blood pressure machine at a pharmacy. ? A home blood pressure monitor.  If you are between 33 years and 15 years old, ask your health care provider if you should take aspirin to prevent strokes.  Have regular diabetes screenings. This involves taking a blood sample to check your  fasting blood sugar level. ? If you are at a normal weight and have a low risk for diabetes, have this test once every three years after 81 years of age. ? If you are overweight and have a high risk for diabetes, consider being tested at a younger age or more often. Preventing infection Hepatitis B  If you have a higher risk for hepatitis B, you should be screened for this virus. You are considered at high risk for hepatitis B if: ? You were born in a country where hepatitis B is common. Ask your health care provider which countries are considered high risk. ? Your parents were born in a high-risk country, and you have not been immunized against hepatitis B (hepatitis B vaccine). ? You have HIV or AIDS. ? You use needles to inject street drugs. ? You live with someone who has hepatitis B. ? You have had sex with someone who has hepatitis B. ? You get hemodialysis treatment. ? You take certain medicines for conditions, including cancer, organ transplantation, and autoimmune conditions.  Hepatitis C  Blood testing is recommended for: ? Everyone born from [birth year] through 1963. ? Anyone with known risk factors for hepatitis C.  Sexually transmitted infections (STIs)  You should be screened for sexually transmitted infections (STIs) including gonorrhea and chlamydia if: ? You are sexually active and are younger than 81 years of age. ? You are older than 81 years of age and your health care provider tells you that you are at risk for this type of infection. ? Your sexual activity has changed since you were last screened and you are at an increased risk for chlamydia or gonorrhea. Ask your health care provider if you are at risk.  If you do not have HIV, but are at risk, it may be recommended that you take a prescription medicine daily to prevent HIV infection. This is called pre-exposure prophylaxis (PrEP). You are considered at risk if: ? You are sexually active and do not regularly use condoms  or know the HIV status of your partner(s). ? You take drugs by injection. ? You are sexually active with a partner who has HIV.  Talk with your health care provider about whether you are at high risk of being infected with HIV. If you choose to begin PrEP, you should first be tested for HIV. You should then be tested every 3 months for as long as you are taking PrEP. Pregnancy  If  you are premenopausal and you may become pregnant, ask your health care provider about preconception counseling.  If you may become pregnant, take 400 to 800 micrograms (mcg) of folic acid every day.  If you want to prevent pregnancy, talk to your health care provider about birth control (contraception). Osteoporosis and menopause  Osteoporosis is a disease in which the bones lose minerals and strength with aging. This can result in serious bone fractures. Your risk for osteoporosis can be identified using a bone density scan.  If you are 46 years of age or older, or if you are at risk for osteoporosis and fractures, ask your health care provider if you should be screened.  Ask your health care provider whether you should take a calcium or vitamin D supplement to lower your risk for osteoporosis.  Menopause may have certain physical symptoms and risks.  Hormone replacement therapy may reduce some of these symptoms and risks. Talk to your health care provider about whether hormone replacement therapy is right for you. Follow these instructions at home:  Schedule regular health, dental, and eye exams.  Stay current with your immunizations.  Do not use any tobacco products including cigarettes, chewing tobacco, or electronic cigarettes.  If you are pregnant, do not drink alcohol.  If you are breastfeeding, limit how much and how often you drink alcohol.  Limit alcohol intake to no more than 1 drink per day for nonpregnant women. One drink equals 12 ounces of beer, 5 ounces of wine, or 1 ounces of hard  liquor.  Do not use street drugs.  Do not share needles.  Ask your health care provider for help if you need support or information about quitting drugs.  Tell your health care provider if you often feel depressed.  Tell your health care provider if you have ever been abused or do not feel safe at home. This information is not intended to replace advice given to you by your health care provider. Make sure you discuss any questions you have with your health care provider. Document Released: 10/12/2010 Document Revised: 09/04/2015 Document Reviewed: 12/31/2014 Elsevier Interactive Patient Education  [employer].

## 2017-07-26 NOTE — Assessment & Plan Note (Signed)
BP at goal on lisinopril/hctz. Checking CMP and adjust as needed.  

## 2017-07-26 NOTE — Assessment & Plan Note (Addendum)
Checking HgA1c, taking glimepiride, actos, metformin. Last HGA1c at goal of <8. Foot exam up to date and eye exam up to date. Complicated by glaucoma and hyperlipidemia.

## 2017-07-26 NOTE — Progress Notes (Signed)
   Subjective:    Patient ID: Whitney [name], female    DOB: 07-Aug-1934, 83 y.o.   MRN: [id number]  HPI The patient is an 83 YO female coming in for physical. Denies new concerns.   PMH, [facility], social history reviewed and updated.   Review of Systems  Constitutional: Negative.   HENT: Positive for hearing loss.   Eyes: Negative.   Respiratory: Negative for cough, chest tightness and shortness of breath.   Cardiovascular: Negative for chest pain, palpitations and leg swelling.  Gastrointestinal: Negative for abdominal distention, abdominal pain, constipation, diarrhea, nausea and vomiting.  Musculoskeletal: Negative.   Skin: Negative.   Neurological: Negative.   Psychiatric/Behavioral: Negative.       Objective:   Physical Exam  Constitutional: She is oriented to person, place, and time. She appears well-developed and well-nourished.  overweight  HENT:  Head: Normocephalic and atraumatic.  Eyes: EOM are normal.  Neck: Normal range of motion.  Cardiovascular: Normal rate and regular rhythm.  Pulmonary/Chest: Effort normal and breath sounds normal. No respiratory distress. She has no wheezes. She has no rales.  Abdominal: Soft. Bowel sounds are normal. She exhibits no distension. There is no tenderness. There is no rebound.  Musculoskeletal: She exhibits no edema.  Neurological: She is alert and oriented to person, place, and time. Coordination normal.  Skin: Skin is warm and dry.  Psychiatric: She has a normal mood and affect.   Vitals:   07/26/17 0832  BP: 120/90  Pulse: 65  Temp: 97.8 F (36.6 C)  TempSrc: Oral  SpO2: 95%  Weight: 167 lb (75.8 kg)  Height: 5\' 1"  (1.549 m)      Assessment & Plan:

## 2017-07-27 DIAGNOSIS — H35071 Retinal telangiectasis, right eye: Secondary | ICD-10-CM | POA: Diagnosis not present

## 2017-07-27 DIAGNOSIS — E113311 Type 2 diabetes mellitus with moderate nonproliferative diabetic retinopathy with macular edema, right eye: Secondary | ICD-10-CM | POA: Diagnosis not present

## 2017-07-27 DIAGNOSIS — E113312 Type 2 diabetes mellitus with moderate nonproliferative diabetic retinopathy with macular edema, left eye: Secondary | ICD-10-CM | POA: Diagnosis not present

## 2017-07-27 DIAGNOSIS — H35072 Retinal telangiectasis, left eye: Secondary | ICD-10-CM | POA: Diagnosis not present

## 2017-08-10 LAB — HM DIABETES EYE EXAM

## 2017-08-30 DIAGNOSIS — H35071 Retinal telangiectasis, right eye: Secondary | ICD-10-CM | POA: Diagnosis not present

## 2017-08-30 DIAGNOSIS — H35072 Retinal telangiectasis, left eye: Secondary | ICD-10-CM | POA: Diagnosis not present

## 2017-08-30 DIAGNOSIS — E113312 Type 2 diabetes mellitus with moderate nonproliferative diabetic retinopathy with macular edema, left eye: Secondary | ICD-10-CM | POA: Diagnosis not present

## 2017-08-30 DIAGNOSIS — E113311 Type 2 diabetes mellitus with moderate nonproliferative diabetic retinopathy with macular edema, right eye: Secondary | ICD-10-CM | POA: Diagnosis not present

## 2017-09-06 ENCOUNTER — Telehealth: Payer: Self-pay

## 2017-09-06 ENCOUNTER — Ambulatory Visit (INDEPENDENT_AMBULATORY_CARE_PROVIDER_SITE_OTHER): Payer: Medicare HMO | Admitting: Neurology

## 2017-09-06 ENCOUNTER — Encounter: Payer: Self-pay | Admitting: Neurology

## 2017-09-06 VITALS — BP 151/79 | HR 64 | Ht 61.0 in | Wt 166.0 lb

## 2017-09-06 DIAGNOSIS — R0683 Snoring: Secondary | ICD-10-CM

## 2017-09-06 DIAGNOSIS — G479 Sleep disorder, unspecified: Secondary | ICD-10-CM

## 2017-09-06 DIAGNOSIS — Z820 Family history of epilepsy and other diseases of the nervous system: Secondary | ICD-10-CM

## 2017-09-06 DIAGNOSIS — R351 Nocturia: Secondary | ICD-10-CM

## 2017-09-06 NOTE — Telephone Encounter (Signed)
VO for HST from Dr. Athar received. HST order placed.  

## 2017-09-06 NOTE — Patient Instructions (Signed)

## 2017-09-06 NOTE — Progress Notes (Signed)
nSubjective:    Whitney Whitney: [name] is a 83 y.o. female.  HPI     Star Age, MD, PhD [facility] [address], [address] [place], [place] [address] Dear Dr. Sharlet Salina,  I saw your Whitney, Whitney Whitney, upon your kind request in my neurologic clinic today for initial consultation of her sleep disorder, in particular, concern for underlying obstructive sleep apnea. Whitney Whitney is unaccompanied today. As you know, Whitney Whitney is a very pleasant 83 year old lady with an underlying medical history of glaucoma, diabetes, hypertension, hearing loss, hyperlipidemia, and borderline obesity, who reports snoring and excessive daytime somnolence. I reviewed your office note from 07/26/2017 and 07/18/2017. Her Epworth sleepiness score is 4 out of 24, fatigue score is 32 out of 63. She is retired, lives by herself, she is widowed, she has 4 children. She has never smoked, does not utilize alcohol, drinks caffeine been limitation, 1 cup of coffee in Whitney morning but not necessarily daily. She reports sleep disruption and nonrestorative sleep. 3 of her 4 kids have sleep apnea and uses CPAP machine, her oldest daughter has not been tested but snores. She has 1 son and 3 daughters. She denies morning headaches, she denies symptoms of neuropathy. She has significant nocturia about 3 times per average night. Bedtime is between 9 and 10 and rise time around 7. She has no pets in Whitney household. She has a TV in Whitney bedroom but typically turns it off at bedtime. She reports that she was told by her ophthalmologist to get checked out for sleep apnea. She has had eye problems. She gets injections for this. She is not sure if she has macular degeneration.  Her Past Medical History Is Significant For: Past Medical History:  Diagnosis Date  . Anxiety   . Baker's cyst   . Chest pain   . Diabetes mellitus type 2, uncontrolled ([place])   . Diabetes mellitus without complication ([place])    . Disc disease, degenerative, cervical   . Diverticulosis of colon   . DJD (degenerative joint disease) of knee   . Facial pain, atypical   . GERD (gastroesophageal reflux disease)   . Glaucoma   . Hypercholesteremia   . Hypertension   . Lumbar spondylosis   . Obesity   . Onychomycosis   . Osteopenia     Her Past Surgical History Is Significant For: Past Surgical History:  Procedure Laterality Date  . right eye surgery  2010/10/01   retina in right eye  . VAGINAL HYSTERECTOMY  2002   Dr. Kem Kays repair    Her Family History Is Significant For: Family History  Problem Relation Age of Onset  . Diabetes Mother   . Colon cancer Neg Hx   . Stomach cancer Neg Hx     Her Social History Is Significant For: Social History   Socioeconomic History  . Marital status: Widowed    Spouse name: deceased Sep 30, 2008  . Number of children: Not on file  . Years of education: Not on file  . Highest education level: Not on file  Occupational History  . Not on file  Social Needs  . Financial resource strain: Not very hard  . Food insecurity:    Worry: Never true    Inability: Never true  . Transportation needs:    Medical: No    Non-medical: No  Tobacco Use  . Smoking status: Never Smoker  . Smokeless tobacco: Never Used  Substance and Sexual Activity  .  Alcohol use: No  . Drug use: No  . Sexual activity: Never  Lifestyle  . Physical activity:    Days per week: 4 days    Minutes per session: 40 min  . Stress: Only a little  Relationships  . Social connections:    Talks on phone: More than three times a week    Gets together: More than three times a week    Attends religious service: More than 4 times per year    Active member of club or organization: Not on file    Attends meetings of clubs or organizations: More than 4 times per year    Relationship status: Widowed  Other Topics Concern  . Not on file  Social History Narrative   Lives alone   Spouse passed away Jul 11, 2008    Son;    3 girls   Clinical cytogeneticist;     Her Allergies Are:  No Known Allergies:   Her Current Medications Are:  Outpatient Encounter Medications as of 09/06/2017  Medication Sig  . aspirin 81 MG tablet Take 1 tablet (81 mg total) by mouth daily.  Marland Kitchen atropine 1 % ophthalmic solution Place 1 drop into both eyes 4 (four) times daily.   . blood glucose meter kit and supplies KIT Use to test blood sugar up to 3 times daily. DX E11.9  . Calcium Carbonate-Vitamin D (CALCIUM-VITAMIN D) 500-200 MG-UNIT per tablet Take 1 tablet by mouth 2 (two) times daily with a meal.  . dorzolamide-timolol (COSOPT) 22.3-6.8 MG/ML ophthalmic solution Place 1 drop into both eyes 2 (two) times daily.   . DUREZOL 0.05 % EMUL   . glimepiride (AMARYL) 4 MG tablet TAKE 1 TABLET EVERY DAY BEFORE BREAKFAST  . glucose blood (COOL BLOOD GLUCOSE TEST STRIPS) test strip Use to test blood sugar up to 1 times daily. DX E11.9  . ketorolac (ACULAR) 0.4 % SOLN INSTILL 1 DROP INTO EACH EYE QID  . Lancet Devices (ACCU-CHEK SOFTCLIX) lancets Use to test blood sugar up to 3 times a day. DX E11.9  . latanoprost (XALATAN) 0.005 % ophthalmic solution Place 1 drop into both eyes at bedtime.   Marland Kitchen lisinopril-hydrochlorothiazide (PRINZIDE,ZESTORETIC) 20-12.5 MG tablet Take 1 tablet by mouth daily.  . metFORMIN (GLUCOPHAGE) 1000 MG tablet Take 1 tablet (1,000 mg total) by mouth 2 (two) times daily with a meal.  . Multiple Vitamin (MULTI VITAMIN DAILY PO) Take one by mouth daily  . pioglitazone (ACTOS) 30 MG tablet Take 1 tablet (30 mg total) by mouth daily.  . simvastatin (ZOCOR) 20 MG tablet Take 1 tablet (20 mg total) by mouth at bedtime.   No facility-administered encounter medications on file as of 09/06/2017.   :  Review of Systems:  Out of a complete 14 point review of systems, all are reviewed and negative with Whitney exception of these symptoms as listed below: Review of Systems  Neurological:       Pt presents today to discuss her sleep. Pt  has never had a sleep study but does endorse snoring.  Epworth Sleepiness Scale 0= would never doze 1= slight chance of dozing 2= moderate chance of dozing 3= high chance of dozing  Sitting and reading: 1 Watching TV: 1 Sitting inactive in a public place (ex. Theater or meeting): 1 As a passenger in a car for an hour without a break: 0 Lying down to rest in Whitney afternoon: 1 Sitting and talking to someone: 0 Sitting quietly after lunch (no alcohol): 0 In a car,  while stopped in traffic: 0 Total: 4     Objective:  Neurological Exam  Physical Exam Physical Examination:   Vitals:   09/06/17 0856  BP: (!) 151/79  Pulse: 64    General Examination: Whitney Whitney is a very pleasant 83 y.o. female in no acute distress. She appears well-developed and well-nourished and well groomed.   HEENT: Normocephalic, atraumatic, pupils are equal, round and reactive to light and accommodation. Corrective eye glasses. Extraocular tracking is good without limitation to gaze excursion or nystagmus noted. Normal smooth pursuit is noted. Hearing is grossly intact. L hearing aid in place. Face is symmetric with normal facial animation and normal facial sensation. Speech is clear with no dysarthria noted. There is no hypophonia. There is no lip, neck/head, jaw or voice tremor. Neck is supple with full range of passive and active motion. There are no carotid bruits on auscultation. Oropharynx exam reveals: mild mouth dryness, adequate dental hygiene with partial plate on top. Moderate airway crowding, due to wider uvula, which ends in a wisp-like ending, larger tongue, tonsils of 2+. Mallampati is class I. Tongue protrudes centrally and palate elevates symmetrically. 1+/2+ in size/absent. Neck size is 15.75 inches. She has a Mild overbite.   Chest: Clear to auscultation without wheezing, rhonchi or crackles noted.  Heart: S1+S2+0, regular and normal without murmurs, rubs or gallops noted.   Abdomen: Soft,  non-tender and non-distended with normal bowel sounds appreciated on auscultation.  Extremities: There is no pitting edema in Whitney distal lower extremities bilaterally. Pedal pulses are intact.  Skin: Warm and dry without trophic changes noted.  Musculoskeletal: exam reveals no obvious joint deformities, tenderness or joint swelling or erythema.   Neurologically:   Mental status: Whitney Whitney is awake, alert and oriented in all 4 spheres. Her immediate and remote memory, attention, language skills and fund of knowledge are appropriate. There is no evidence of aphasia, agnosia, apraxia or anomia. Speech is clear with normal prosody and enunciation. Thought process is linear. Mood is normal and affect is normal.  Cranial nerves II - XII are as described above under HEENT exam. In addition: shoulder shrug is normal with equal shoulder height noted. Motor exam: Normal bulk, strength and tone is noted. There is no drift, tremor or rebound. Reflexes are 1+ In Whitney upper extremities, trace in Whitney knees and absent in Whitney ankles. Fine motor skills are grossly intact with normal hand movements and foot movements. Cerebellar testing: No dysmetria or intention tremor on finger to nose testing. Heel to shin is unremarkable bilaterally. There is no truncal or gait ataxia.  Sensory exam: intact to light touch.  Gait, station and balance: She stands easily. No veering to one side is noted. No leaning to one side is noted. Posture is age-appropriate and stance is narrow based. Gait shows normal stride length and normal pace. No problems turning are noted.   Assessment and Plan:  In summary, Whitney Whitney is a very pleasant 83 y.o.-year old female with an underlying medical history of glaucoma, diabetes, hypertension, hearing loss, hyperlipidemia, and borderline obesity, whose history and physical exam are concerning for obstructive sleep apnea (OSA). I had a long chat with Whitney Whitney about my findings and Whitney  diagnosis of OSA, its prognosis and treatment options. We talked about medical treatments, surgical interventions and non-pharmacological approaches. I explained in particular Whitney risks and ramifications of untreated moderate to severe OSA, especially with respect to developing cardiovascular disease down [address], including congestive heart failure, difficult  to treat hypertension, cardiac arrhythmias, or stroke. Even type 2 diabetes has, in part, been linked to untreated OSA. Symptoms of untreated OSA include daytime sleepiness, memory problems, mood irritability and mood disorder such as depression and anxiety, lack of energy, as well as recurrent headaches, especially morning headaches. We talked about trying to maintain a healthy lifestyle in general, as well as Whitney importance of weight control. I encouraged Whitney Whitney to eat healthy, exercise daily and keep well hydrated, to keep a scheduled bedtime and wake time routine, to not skip any meals and eat healthy snacks in between meals. I advised Whitney Whitney not to drive when feeling sleepy. I recommended Whitney following at this time: sleep study with potential positive airway pressure titration. (We will score hypopneas at 4%).   I explained Whitney sleep test procedure to Whitney Whitney and also outlined possible surgical and non-surgical treatment options of OSA, including Whitney use of a custom-made dental device (which would require a referral to a specialist dentist or oral surgeon), upper airway surgical options, such as pillar implants, radiofrequency surgery, tongue base surgery, and UPPP (which would involve a referral to an ENT surgeon). Rarely, jaw surgery such as mandibular advancement may be considered.  I also explained Whitney CPAP treatment option to Whitney Whitney, who indicated that she would be willing to try CPAP if Whitney need arises. I explained Whitney importance of being compliant with PAP treatment, not only for insurance purposes but primarily to improve  Her symptoms, and for Whitney Whitney's long term health benefit, including to reduce Her cardiovascular risks. I answered all her questions today and Whitney Whitney was in agreement. I would like to see her back after Whitney sleep study is completed and encouraged her to call with any interim questions, concerns, problems or updates.   Thank you very much for allowing me to participate in Whitney care of this nice Whitney. If I can be of any further assistance to you please do not hesitate to call me at [phone].  Sincerely,   Star Age, MD, PhD

## 2017-09-06 NOTE — Telephone Encounter (Signed)
[employer] medicare denied in lab sleep study, need HST order. Will use watchpat machine

## 2017-09-15 DIAGNOSIS — H903 Sensorineural hearing loss, bilateral: Secondary | ICD-10-CM | POA: Diagnosis not present

## 2017-09-22 ENCOUNTER — Telehealth: Payer: Self-pay | Admitting: Internal Medicine

## 2017-09-22 DIAGNOSIS — R69 Illness, unspecified: Secondary | ICD-10-CM | POA: Diagnosis not present

## 2017-09-22 MED ORDER — GLUCOSE BLOOD VI STRP: ORAL_STRIP | 5 refills | Status: DC

## 2017-09-22 NOTE — Telephone Encounter (Signed)
Sent to pharmacy requested.

## 2017-09-22 NOTE — Telephone Encounter (Signed)
LOV 07/26/17 Dr. Sharlet Salina Request for new test strips.

## 2017-09-22 NOTE — Telephone Encounter (Addendum)
Copied from [place] [id number]. Topic: Quick Communication - See Telephone Encounter >> Sep 22, 2017 10:07 AM Ivar Drape wrote: CRM for notification. See Telephone encounter for: 09/22/17. Patient stated she needs a new prescription for One Touch Verio Strips.  Her new insurance company [employer] is requesting the change.  Please the prescription sent to her preferred pharmacy [facility]. [place], [place]

## 2017-09-28 ENCOUNTER — Ambulatory Visit (INDEPENDENT_AMBULATORY_CARE_PROVIDER_SITE_OTHER): Payer: Medicare HMO | Admitting: Neurology

## 2017-09-28 DIAGNOSIS — G4733 Obstructive sleep apnea (adult) (pediatric): Secondary | ICD-10-CM

## 2017-09-28 DIAGNOSIS — G479 Sleep disorder, unspecified: Secondary | ICD-10-CM

## 2017-09-29 DIAGNOSIS — H409 Unspecified glaucoma: Secondary | ICD-10-CM | POA: Diagnosis not present

## 2017-09-29 DIAGNOSIS — E669 Obesity, unspecified: Secondary | ICD-10-CM | POA: Diagnosis not present

## 2017-09-29 DIAGNOSIS — Z7982 Long term (current) use of aspirin: Secondary | ICD-10-CM | POA: Diagnosis not present

## 2017-09-29 DIAGNOSIS — Z6832 Body mass index (BMI) 32.0-32.9, adult: Secondary | ICD-10-CM | POA: Diagnosis not present

## 2017-09-29 DIAGNOSIS — Z809 Family history of malignant neoplasm, unspecified: Secondary | ICD-10-CM | POA: Diagnosis not present

## 2017-09-29 DIAGNOSIS — E785 Hyperlipidemia, unspecified: Secondary | ICD-10-CM | POA: Diagnosis not present

## 2017-09-29 DIAGNOSIS — Z7984 Long term (current) use of oral hypoglycemic drugs: Secondary | ICD-10-CM | POA: Diagnosis not present

## 2017-09-29 DIAGNOSIS — E119 Type 2 diabetes mellitus without complications: Secondary | ICD-10-CM | POA: Diagnosis not present

## 2017-09-29 DIAGNOSIS — I10 Essential (primary) hypertension: Secondary | ICD-10-CM | POA: Diagnosis not present

## 2017-09-29 DIAGNOSIS — Z833 Family history of diabetes mellitus: Secondary | ICD-10-CM | POA: Diagnosis not present

## 2017-09-29 NOTE — Addendum Note (Signed)
Addended by: Star Age on: 09/29/2017 06:46 PM   Modules accepted: Orders

## 2017-09-29 NOTE — Procedures (Signed)
[facility] @[facility] [place], [place] [address] NAME: Whitney [name]                                                                   DOB: 10-10-1934 MEDICAL RECORD NUMBER [id number]                                                  DOS:  09/28/17 REFERRING PHYSICIAN: Pricilla Holm, MD STUDY PERFORMED: Home Sleep Test [facility]) HISTORY: 83 year old lady with a history of glaucoma, diabetes, hypertension, hearing loss, hyperlipidemia, and obesity, who reports snoring and excessive daytime somnolence. Her Epworth sleepiness score is 4 out of 24, BMI: 31.2.  STUDY RESULTS:  Total Recording Time: 10 hours, 4 minutes (valid test time: 9 hours, 15 min) Total Apnea/Hypopnea Index (AHI): 30.9/h, RDI: 31.6/h Average Oxygen Saturation: 93%, Lowest Oxygen Desaturation: 86 %  Total Time Oxygen Saturation Below or at 88%: 0.4 minutes  Average Heart Rate:   61 bpm (between 52 and 93 bpm) IMPRESSION: OSA RECOMMENDATION:  This home sleep test demonstrates severe obstructive sleep apnea with a total AHI of 30.9/hour and O2 nadir of 86%. Given the patient's medical history and sleep related complaints, treatment with positive airway pressure (in the form of CPAP) is recommended. This will require a full night CPAP titration study for proper treatment settings, O2 monitoring and mask fitting. Based on the severity of the sleep disordered breathing an attended titration study is indicated. However, patient's insurance has denied an attended sleep study; therefore, the patient will be advised to proceed with an autoPAP titration/trial at home for now. Please note that untreated obstructive sleep apnea may carry additional perioperative morbidity. Patients with significant obstructive sleep apnea should receive perioperative PAP therapy and the surgeons and particularly the anesthesiologist should be informed of the diagnosis and the severity of the sleep disordered breathing.  Other OSA treatment options may include avoidance of supine sleep position along with weight loss, upper airway or jaw surgery in selected patients or the use of an oral appliance in certain patients. ENT evaluation and/or consultation with a maxillofacial surgeon or dentist may be feasible in some instances. The patient should be cautioned not to drive, work at heights, or operate dangerous or heavy equipment when tired or sleepy. Review and reiteration of good sleep hygiene measures should be pursued with any patient. Other causes of the patient's symptoms, including circadian rhythm disturbances, an underlying mood disorder, medication effect and/or an underlying medical problem cannot be ruled out based on this test. Clinical correlation is recommended. The patient and his referring provider will be notified of the test results. The patient will be seen in follow up in sleep clinic at [facility].  I certify that I have reviewed the raw data recording prior to the issuance of this report in accordance with the standards of the American Academy of Sleep Medicine (AASM). Star Age, MD, PhD [facility]) Diplomat, ABPN (Neurology and Sleep)

## 2017-09-29 NOTE — Progress Notes (Signed)
Patient referred by Dr. Sharlet Salina, seen by me on 09/06/17, HST on 09/28/17.    Please call and notify the patient that the recent home sleep test showed obstructive sleep apnea in the severe range. While I recommend treatment for this in the form CPAP, her insurance will not approve a sleep study for this. They will likely only approve a trial of autoPAP, which means, that we don't have to bring her in for a sleep study with CPAP, but will let her try an autoPAP machine at home, through a DME company (of her choice, or as per insurance requirement). The DME representative will educate her on how to use the machine, how to put the mask on, etc. I have placed an order in the chart. Please send referral, talk to patient, send report to referring MD. We will need a FU in sleep clinic for 10 weeks post-PAP set up, please arrange that with me or one of our NPs. Thanks,   Star Age, MD, PhD [facility])

## 2017-09-29 NOTE — Progress Notes (Signed)
c 

## 2017-09-30 ENCOUNTER — Telehealth: Payer: Self-pay

## 2017-09-30 NOTE — Telephone Encounter (Signed)
-----   Message from Star Age, MD sent at 09/29/2017  6:46 PM EDT ----- Patient referred by Dr. Sharlet Salina, seen by me on 09/06/17, HST on 09/28/17.    Please call and notify the patient that the recent home sleep test showed obstructive sleep apnea in the severe range. While I recommend treatment for this in the form CPAP, her insurance will not approve a sleep study for this. They will likely only approve a trial of autoPAP, which means, that we don't have to bring her in for a sleep study with CPAP, but will let her try an autoPAP machine at home, through a DME company (of her choice, or as per insurance requirement). The DME representative will educate her on how to use the machine, how to put the mask on, etc. I have placed an order in the chart. Please send referral, talk to patient, send report to referring MD. We will need a FU in sleep clinic for 10 weeks post-PAP set up, please arrange that with me or one of our NPs. Thanks,   Star Age, MD, PhD [facility])

## 2017-09-30 NOTE — Telephone Encounter (Signed)
I called pt. I advised pt that Dr. Rexene Alberts reviewed their sleep study results and found that pt has severe osa. Dr. Rexene Alberts recommends that pt start an auto pap, since her insurance will not approve an in lab cpap titration. I reviewed PAP compliance expectations with the pt. Pt is agreeable to starting an auto-PAP. I advised pt that an order will be sent to a DME, [facility], and [facility] will call the pt within about one week after they file with the pt's insurance. [facility] will show the pt how to use the machine, fit for masks, and troubleshoot the auto-PAP if needed. A follow up appt was made for insurance purposes with Dr. Rexene Alberts on 12/29/17 at 9:30am. Pt verbalized understanding to arrive 15 minutes early and bring their auto-PAP. A letter with all of this information in it will be mailed to the pt as a reminder. I verified with the pt that the address we have on file is correct. Pt verbalized understanding of results. Pt had no questions at this time but was encouraged to call back if questions arise.

## 2017-10-04 DIAGNOSIS — E113312 Type 2 diabetes mellitus with moderate nonproliferative diabetic retinopathy with macular edema, left eye: Secondary | ICD-10-CM | POA: Diagnosis not present

## 2017-10-04 DIAGNOSIS — H35071 Retinal telangiectasis, right eye: Secondary | ICD-10-CM | POA: Diagnosis not present

## 2017-10-04 DIAGNOSIS — E113311 Type 2 diabetes mellitus with moderate nonproliferative diabetic retinopathy with macular edema, right eye: Secondary | ICD-10-CM | POA: Diagnosis not present

## 2017-10-04 DIAGNOSIS — H35072 Retinal telangiectasis, left eye: Secondary | ICD-10-CM | POA: Diagnosis not present

## 2017-10-19 DIAGNOSIS — H35041 Retinal micro-aneurysms, unspecified, right eye: Secondary | ICD-10-CM | POA: Diagnosis not present

## 2017-10-19 DIAGNOSIS — E113312 Type 2 diabetes mellitus with moderate nonproliferative diabetic retinopathy with macular edema, left eye: Secondary | ICD-10-CM | POA: Diagnosis not present

## 2017-10-19 DIAGNOSIS — H35071 Retinal telangiectasis, right eye: Secondary | ICD-10-CM | POA: Diagnosis not present

## 2017-10-19 DIAGNOSIS — H43811 Vitreous degeneration, right eye: Secondary | ICD-10-CM | POA: Diagnosis not present

## 2017-10-19 DIAGNOSIS — H35072 Retinal telangiectasis, left eye: Secondary | ICD-10-CM | POA: Diagnosis not present

## 2017-10-19 DIAGNOSIS — E113311 Type 2 diabetes mellitus with moderate nonproliferative diabetic retinopathy with macular edema, right eye: Secondary | ICD-10-CM | POA: Diagnosis not present

## 2017-10-23 DIAGNOSIS — R69 Illness, unspecified: Secondary | ICD-10-CM | POA: Diagnosis not present

## 2017-11-11 DIAGNOSIS — G4733 Obstructive sleep apnea (adult) (pediatric): Secondary | ICD-10-CM | POA: Diagnosis not present

## 2017-11-17 DIAGNOSIS — H35042 Retinal micro-aneurysms, unspecified, left eye: Secondary | ICD-10-CM | POA: Diagnosis not present

## 2017-11-17 DIAGNOSIS — E113312 Type 2 diabetes mellitus with moderate nonproliferative diabetic retinopathy with macular edema, left eye: Secondary | ICD-10-CM | POA: Diagnosis not present

## 2017-11-17 DIAGNOSIS — H3562 Retinal hemorrhage, left eye: Secondary | ICD-10-CM | POA: Diagnosis not present

## 2017-11-20 DIAGNOSIS — R69 Illness, unspecified: Secondary | ICD-10-CM | POA: Diagnosis not present

## 2017-12-01 DIAGNOSIS — E113313 Type 2 diabetes mellitus with moderate nonproliferative diabetic retinopathy with macular edema, bilateral: Secondary | ICD-10-CM | POA: Diagnosis not present

## 2017-12-01 DIAGNOSIS — H401131 Primary open-angle glaucoma, bilateral, mild stage: Secondary | ICD-10-CM | POA: Diagnosis not present

## 2017-12-01 DIAGNOSIS — Z961 Presence of intraocular lens: Secondary | ICD-10-CM | POA: Diagnosis not present

## 2017-12-01 DIAGNOSIS — H353230 Exudative age-related macular degeneration, bilateral, stage unspecified: Secondary | ICD-10-CM | POA: Diagnosis not present

## 2017-12-01 LAB — HM DIABETES EYE EXAM

## 2017-12-07 ENCOUNTER — Encounter: Payer: Self-pay | Admitting: Internal Medicine

## 2017-12-07 NOTE — Progress Notes (Signed)
Abstracted and sent to scan  

## 2017-12-12 DIAGNOSIS — G4733 Obstructive sleep apnea (adult) (pediatric): Secondary | ICD-10-CM | POA: Diagnosis not present

## 2017-12-16 DIAGNOSIS — G4733 Obstructive sleep apnea (adult) (pediatric): Secondary | ICD-10-CM | POA: Diagnosis not present

## 2017-12-18 DIAGNOSIS — R69 Illness, unspecified: Secondary | ICD-10-CM | POA: Diagnosis not present

## 2017-12-27 ENCOUNTER — Other Ambulatory Visit: Payer: Self-pay | Admitting: Internal Medicine

## 2017-12-27 ENCOUNTER — Encounter: Payer: Self-pay | Admitting: Neurology

## 2017-12-27 DIAGNOSIS — Z1231 Encounter for screening mammogram for malignant neoplasm of breast: Secondary | ICD-10-CM

## 2017-12-29 ENCOUNTER — Encounter: Payer: Self-pay | Admitting: Neurology

## 2017-12-29 ENCOUNTER — Ambulatory Visit: Payer: Medicare HMO | Admitting: Neurology

## 2017-12-29 VITALS — BP 146/74 | HR 66 | Ht 61.0 in | Wt 168.0 lb

## 2017-12-29 DIAGNOSIS — Z9989 Dependence on other enabling machines and devices: Secondary | ICD-10-CM

## 2017-12-29 DIAGNOSIS — G4733 Obstructive sleep apnea (adult) (pediatric): Secondary | ICD-10-CM

## 2017-12-29 NOTE — Patient Instructions (Addendum)
Please continue using your autoPAP regularly. While your insurance requires that you use PAP at least 4 hours each night on 70% of the nights, I recommend, that you not skip any nights and use it throughout the night if you can. Getting used to PAP and staying with the treatment long term does take time and patience and discipline. Untreated obstructive sleep apnea when it is moderate to severe can have an adverse impact on cardiovascular health and raise her risk for heart disease, arrhythmias, hypertension, congestive heart failure, stroke and diabetes. Untreated obstructive sleep apnea causes sleep disruption, nonrestorative sleep, and sleep deprivation. This can have an impact on your day to day functioning and cause daytime sleepiness and impairment of cognitive function, memory loss, mood disturbance, and problems focussing. Using PAP regularly can improve these symptoms.  As discussed, I will increase your pressure settings a little bit in the hope to improve your symptoms and to optimize your treatment.  We will ask [facility] to do another mask fitting, as your mask is still leaking too much.   Please call in about 6 weeks, so we can pull another CPAP usage report (wireless).

## 2017-12-29 NOTE — Progress Notes (Signed)
Subjective:    Patient ID: ANALYSSE QUINONEZ is a 83 y.o. female.  HPI     Interim history:   Ms. Lodato is a very pleasant 83 year old lady with an underlying medical history of glaucoma, diabetes, hypertension, hearing loss, hyperlipidemia, and borderline obesity, who presents for follow-up consultation of her obstructive sleep apnea, after home sleep testing and starting AutoPap therapy. The patient is unaccompanied today. I first met her on 09/06/2017 at the request of her primary care physician, at which time she reported snoring and daytime somnolence. Her insurance denied and laboratory attended sleep study. She had a home sleep test on 09/28/2017 which indicated severe sleep apnea with an AHI of 30.9 per hour, average oxygen saturation of 93%, nadir of 86%. She was advised to proceed with AutoPap therapy at home.  Today, 12/29/2017: I reviewed her AutoPap compliance data from 11/28/2017 through 12/27/2017 which is a total of 30 days, during which time she used her machine every night with percent used days greater than 4 hours at 83%, indicating very good compliance with an average usage of 6 hours and 14 minutes, residual AHI elevated at 16.5 per hour with mostly obstructive events, 95th percentile pressure at 11.5, pressure range of 5 cm to 12 cm. Leak is on the high side with the 95th percentile at 32.9 L/m. She reports that AutoPap is going fairly well. She has adapted well to treatments and feels better in terms of her sleep consolidation, sleep quality and nocturia. She would like to see a little bit more daytime energy hopefully. She has noticed air leakage. She had tried the nasal pillows before and did not do well with them. She is currently using a nasal mask as I understand. She did not bring the mask or the machine today. She would be willing to try a slightly higher pressure setting to optimize her treatment. She has a follow-up appointment coming up with her PCP. She is also in  regular checkup with her ophthalmologist.  The patient's allergies, current medications, family history, past medical history, past social history, past surgical history and problem list were reviewed and updated as appropriate.   Previously:  09/06/2017: (She) reports snoring and excessive daytime somnolence. I reviewed your office note from 07/26/2017 and 07/18/2017. Her Epworth sleepiness score is 4 out of 24, fatigue score is 32 out of 63. She is retired, lives by herself, she is widowed, she has 4 children. She has never smoked, does not utilize alcohol, drinks caffeine been limitation, 1 cup of coffee in the morning but not necessarily daily. She reports sleep disruption and nonrestorative sleep. 3 of her 4 kids have sleep apnea and uses CPAP machine, her oldest daughter has not been tested but snores. She has 1 son and 3 daughters. She denies morning headaches, she denies symptoms of neuropathy. She has significant nocturia about 3 times per average night. Bedtime is between 9 and 10 and rise time around 7. She has no pets in the household. She has a TV in the bedroom but typically turns it off at bedtime. She reports that she was told by her ophthalmologist to get checked out for sleep apnea. She has had eye problems. She gets injections for this. She is not sure if she has macular degeneration.  Her Past Medical History Is Significant For: Past Medical History:  Diagnosis Date  . Anxiety   . Baker's cyst   . Chest pain   . Diabetes mellitus type 2, uncontrolled ([place])   .  Diabetes mellitus without complication ([place])   . Disc disease, degenerative, cervical   . Diverticulosis of colon   . DJD (degenerative joint disease) of knee   . Facial pain, atypical   . GERD (gastroesophageal reflux disease)   . Glaucoma   . Hypercholesteremia   . Hypertension   . Lumbar spondylosis   . Obesity   . Onychomycosis   . Osteopenia     Her Past Surgical History Is Significant For: Past Surgical  History:  Procedure Laterality Date  . right eye surgery  10-14-10   retina in right eye  . VAGINAL HYSTERECTOMY  2000/07/13   Dr. Kem Kays repair    Her Family History Is Significant For: Family History  Problem Relation Age of Onset  . Diabetes Mother   . Colon cancer Neg Hx   . Stomach cancer Neg Hx     Her Social History Is Significant For: Social History   Socioeconomic History  . Marital status: Widowed    Spouse name: deceased 2008-10-13  . Number of children: Not on file  . Years of education: Not on file  . Highest education level: Not on file  Occupational History  . Not on file  Social Needs  . Financial resource strain: Not very hard  . Food insecurity:    Worry: Never true    Inability: Never true  . Transportation needs:    Medical: No    Non-medical: No  Tobacco Use  . Smoking status: Never Smoker  . Smokeless tobacco: Never Used  Substance and Sexual Activity  . Alcohol use: No  . Drug use: No  . Sexual activity: Never  Lifestyle  . Physical activity:    Days per week: 4 days    Minutes per session: 40 min  . Stress: Only a little  Relationships  . Social connections:    Talks on phone: More than three times a week    Gets together: More than three times a week    Attends religious service: More than 4 times per year    Active member of club or organization: Not on file    Attends meetings of clubs or organizations: More than 4 times per year    Relationship status: Widowed  Other Topics Concern  . Not on file  Social History Narrative   Lives alone   Spouse passed away Jul 13, 2008   Son;    3 girls   Clinical cytogeneticist;     Her Allergies Are:  No Known Allergies:   Her Current Medications Are:  Outpatient Encounter Medications as of 12/29/2017  Medication Sig  . aspirin 81 MG tablet Take 1 tablet (81 mg total) by mouth daily.  . blood glucose meter kit and supplies KIT Use to test blood sugar up to 3 times daily. DX E11.9  . Calcium Carbonate-Vitamin D  (CALCIUM-VITAMIN D) 500-200 MG-UNIT per tablet Take 1 tablet by mouth 2 (two) times daily with a meal.  . dorzolamide-timolol (COSOPT) 22.3-6.8 MG/ML ophthalmic solution Place 1 drop into both eyes 2 (two) times daily.   . DUREZOL 0.05 % EMUL   . glimepiride (AMARYL) 4 MG tablet TAKE 1 TABLET EVERY DAY BEFORE BREAKFAST  . glucose blood (ONETOUCH VERIO) test strip Used to check blood sugars three time daily  . ketorolac (ACULAR) 0.4 % SOLN INSTILL 1 DROP INTO EACH EYE QID  . Lancet Devices (ACCU-CHEK SOFTCLIX) lancets Use to test blood sugar up to 3 times a day. DX E11.9  . latanoprost (XALATAN)  0.005 % ophthalmic solution Place 1 drop into both eyes at bedtime.   Marland Kitchen lisinopril-hydrochlorothiazide (PRINZIDE,ZESTORETIC) 20-12.5 MG tablet Take 1 tablet by mouth daily.  . metFORMIN (GLUCOPHAGE) 1000 MG tablet Take 1 tablet (1,000 mg total) by mouth 2 (two) times daily with a meal.  . Multiple Vitamin (MULTI VITAMIN DAILY PO) Take one by mouth daily  . pioglitazone (ACTOS) 30 MG tablet Take 1 tablet (30 mg total) by mouth daily.  . simvastatin (ZOCOR) 20 MG tablet Take 1 tablet (20 mg total) by mouth at bedtime.  . [DISCONTINUED] atropine 1 % ophthalmic solution Place 1 drop into both eyes 4 (four) times daily.    No facility-administered encounter medications on file as of 12/29/2017.   :  Review of Systems:  Out of a complete 14 point review of systems, all are reviewed and negative with the exception of these symptoms as listed below: Review of Systems  Neurological:       Pt presents today to discuss her auto pap. Pt reports that her auto pap is going well.    Objective:  Neurological Exam  Physical Exam Physical Examination:   Vitals:   12/29/17 0932  BP: (!) 146/74  Pulse: 66    General Examination: The patient is a very pleasant 83 y.o. female in no acute distress. She appears well-developed and well-nourished and well groomed.   HEENT: Normocephalic, atraumatic, pupils are  equal, round and reactive to light and accommodation. Corrective eye glasses. Extraocular tracking is good without limitation to gaze excursion or nystagmus noted. Normal smooth pursuit is noted. Hearing is grossly intact. L hearing aid in place. Face is symmetric with normal facial animation and normal facial sensation. Speech is clear with no dysarthria noted. There is no hypophonia. There is no lip, neck/head, jaw or voice tremor. Neck shows FROM. Oropharynx exam reveals: mild to moderate mouth dryness, adequate dental hygiene with partial plate on top. Moderate airway crowding. Tongue protrudes centrally and palate elevates symmetrically.   Chest: Clear to auscultation without wheezing, rhonchi or crackles noted.  Heart: S1+S2+0, regular and normal without murmurs, rubs or gallops noted.   Abdomen: Soft, non-tender and non-distended with normal bowel sounds appreciated on auscultation.  Extremities: There is no pitting edema in the distal lower extremities bilaterally. R ankle puffier than L.  Skin: Warm and dry without trophic changes noted.  Musculoskeletal: exam reveals no obvious joint deformities, tenderness or joint swelling or erythema.   Neurologically:   Mental status: The patient is awake, alert and oriented in all 4 spheres. Her immediate and remote memory, attention, language skills and fund of knowledge are appropriate. There is no evidence of aphasia, agnosia, apraxia or anomia. Speech is clear with normal prosody and enunciation. Thought process is linear. Mood is normal and affect is normal.  Cranial nerves II - XII are as described above under HEENT exam. In addition: shoulder shrug is normal with equal shoulder height noted. Motor exam: Normal bulk, strength and tone is noted. There is no drift, tremor or rebound. Reflexes are 1+ In the upper extremities, trace in the knees and absent in the ankles. Fine motor skills are grossly intact with normal hand movements and foot  movements. Cerebellar testing: No dysmetria or intention tremor. There is no truncal or gait ataxia.  Sensory exam: intact to light touch.  Gait, station and balance: She stands easily. No veering to one side is noted. No leaning to one side is noted. Posture is age-appropriate and stance is narrow  based. Gait shows normal stride length and normal pace. No problems turning are noted.   Assessment and Plan:  In summary, Haizley R Deiss is a very pleasant 83 year old female with an underlying medical history of glaucoma, diabetes, hypertension, hearing loss, hyperlipidemia, and borderline obesity, who presents for follow up consultation of her obstructive sleep apnea (OSA). She had a HST on 09/28/17 indicated severe OSA and she has established treatment with AutoPap and indicates fairly good results thus far in terms of sleep quality and nocturia improved. She is commended for her treatment adherence. Nevertheless, her AHI is suboptimal around 16 at this time. I would like to increase her pressure settings a little bit. She will benefit from a mask refit as well, we will request this through her DME company. She has been using a nasal mask. Maybe she does need a full facemask. I have asked her to give [place] a call in about 6 weeks so we can look at another compliance download and see if the pressure needs to be adjusted again. She can follow-up routinely in our sleep clinic in 6 months. She is advised to follow-up with one of our nurse practitioners next time. I answered all her questions today and she was in agreement.  I spent 25 minutes in total face-to-face time with the patient, more than 50% of which was spent in counseling and coordination of care, reviewing test results, reviewing medication and discussing or reviewing the diagnosis of OSA, its prognosis and treatment options. Pertinent laboratory and imaging test results that were available during this visit with the patient were reviewed by me and  considered in my medical decision making (see chart for details).

## 2017-12-29 NOTE — Progress Notes (Signed)
Order for auto pap pressure changes sent to [facility].

## 2018-01-03 DIAGNOSIS — H35072 Retinal telangiectasis, left eye: Secondary | ICD-10-CM | POA: Diagnosis not present

## 2018-01-03 DIAGNOSIS — E113312 Type 2 diabetes mellitus with moderate nonproliferative diabetic retinopathy with macular edema, left eye: Secondary | ICD-10-CM | POA: Diagnosis not present

## 2018-01-03 DIAGNOSIS — E113311 Type 2 diabetes mellitus with moderate nonproliferative diabetic retinopathy with macular edema, right eye: Secondary | ICD-10-CM | POA: Diagnosis not present

## 2018-01-03 DIAGNOSIS — H43811 Vitreous degeneration, right eye: Secondary | ICD-10-CM | POA: Diagnosis not present

## 2018-01-03 DIAGNOSIS — H35071 Retinal telangiectasis, right eye: Secondary | ICD-10-CM | POA: Diagnosis not present

## 2018-01-11 DIAGNOSIS — G4733 Obstructive sleep apnea (adult) (pediatric): Secondary | ICD-10-CM | POA: Diagnosis not present

## 2018-01-12 DIAGNOSIS — H35071 Retinal telangiectasis, right eye: Secondary | ICD-10-CM | POA: Diagnosis not present

## 2018-01-12 DIAGNOSIS — H35041 Retinal micro-aneurysms, unspecified, right eye: Secondary | ICD-10-CM | POA: Diagnosis not present

## 2018-01-12 DIAGNOSIS — E113311 Type 2 diabetes mellitus with moderate nonproliferative diabetic retinopathy with macular edema, right eye: Secondary | ICD-10-CM | POA: Diagnosis not present

## 2018-01-12 DIAGNOSIS — H43811 Vitreous degeneration, right eye: Secondary | ICD-10-CM | POA: Diagnosis not present

## 2018-01-24 ENCOUNTER — Ambulatory Visit (INDEPENDENT_AMBULATORY_CARE_PROVIDER_SITE_OTHER): Payer: Medicare HMO | Admitting: Family

## 2018-01-24 ENCOUNTER — Encounter: Payer: Self-pay | Admitting: Family

## 2018-01-24 VITALS — BP 150/88 | HR 95 | Temp 98.5°F | Ht 61.0 in | Wt 166.0 lb

## 2018-01-24 DIAGNOSIS — M62838 Other muscle spasm: Secondary | ICD-10-CM

## 2018-01-24 DIAGNOSIS — M542 Cervicalgia: Secondary | ICD-10-CM

## 2018-01-24 MED ORDER — MELOXICAM 15 MG PO TABS: 15.0000 mg | ORAL_TABLET | Freq: Every day | ORAL | 0 refills | Status: DC

## 2018-01-24 MED ORDER — METHOCARBAMOL 500 MG PO TABS: 500.0000 mg | ORAL_TABLET | Freq: Every evening | ORAL | 0 refills | Status: DC | PRN

## 2018-01-24 NOTE — Progress Notes (Signed)
Whitney [name] is a 83 y.o. female with the following history as recorded in [employer]:  Patient Active Problem List   Diagnosis Date Noted  . Snoring 07/20/2017  . Venous insufficiency 04/26/2017  . Routine general medical examination at a health care facility 05/25/2016  . Type 2 diabetes mellitus with hyperlipidemia ([place]) 02/23/2012  . Obesity 08/23/2011  . GLAUCOMA 08/24/2009  . Hyperlipidemia associated with type 2 diabetes mellitus ([place]) 12/22/2006  . Essential hypertension 12/22/2006  . GERD 12/22/2006  . [place] DISEASE, CERVICAL 12/22/2006  . Osteopenia 12/22/2006    Current Outpatient Medications  Medication Sig Dispense Refill  . aspirin 81 MG tablet Take 1 tablet (81 mg total) by mouth daily. 90 tablet 3  . blood glucose meter kit and supplies KIT Use to test blood sugar up to 3 times daily. DX E11.9 1 each 0  . Calcium Carbonate-Vitamin D (CALCIUM-VITAMIN D) 500-200 MG-UNIT per tablet Take 1 tablet by mouth 2 (two) times daily with a meal. 180 tablet 3  . dorzolamide-timolol (COSOPT) 22.3-6.8 MG/ML ophthalmic solution Place 1 drop into both eyes 2 (two) times daily.     . DUREZOL 0.05 % EMUL     . glimepiride (AMARYL) 4 MG tablet TAKE 1 TABLET EVERY DAY BEFORE BREAKFAST 90 tablet 3  . glucose blood (ONETOUCH VERIO) test strip Used to check blood sugars three time daily 100 each 5  . ketorolac (ACULAR) 0.4 % SOLN INSTILL 1 DROP INTO EACH EYE QID  0  . Lancet Devices (ACCU-CHEK SOFTCLIX) lancets Use to test blood sugar up to 3 times a day. DX E11.9 1 each 0  . latanoprost (XALATAN) 0.005 % ophthalmic solution Place 1 drop into both eyes at bedtime.     Marland Kitchen lisinopril-hydrochlorothiazide (PRINZIDE,ZESTORETIC) 20-12.5 MG tablet Take 1 tablet by mouth daily. 90 tablet 3  . metFORMIN (GLUCOPHAGE) 1000 MG tablet Take 1 tablet (1,000 mg total) by mouth 2 (two) times daily with a meal. 180 tablet 3  . Multiple Vitamin (MULTI VITAMIN DAILY PO) Take one by mouth daily    . pioglitazone  (ACTOS) 30 MG tablet Take 1 tablet (30 mg total) by mouth daily. 90 tablet 3  . simvastatin (ZOCOR) 20 MG tablet Take 1 tablet (20 mg total) by mouth at bedtime. 90 tablet 3  . meloxicam (MOBIC) 15 MG tablet Take 1 tablet (15 mg total) by mouth daily. 20 tablet 0  . methocarbamol (ROBAXIN) 500 MG tablet Take 1 tablet (500 mg total) by mouth at bedtime as needed for muscle spasms. 20 tablet 0   No current facility-administered medications for this visit.     Allergies: Patient has no known allergies.  Past Medical History:  Diagnosis Date  . Anxiety   . Baker's cyst   . Chest pain   . Diabetes mellitus type 2, uncontrolled ([place])   . Diabetes mellitus without complication ([place])   . Disc disease, degenerative, cervical   . Diverticulosis of colon   . DJD (degenerative joint disease) of knee   . Facial pain, atypical   . GERD (gastroesophageal reflux disease)   . Glaucoma   . Hypercholesteremia   . Hypertension   . Lumbar spondylosis   . Obesity   . Onychomycosis   . Osteopenia     Past Surgical History:  Procedure Laterality Date  . right eye surgery  09/2010   retina in right eye  . VAGINAL HYSTERECTOMY  2002   Dr. Kem Kays repair    Family History  Problem  Relation Age of Onset  . Diabetes Mother   . Colon cancer Neg Hx   . Stomach cancer Neg Hx     Social History   Tobacco Use  . Smoking status: Never Smoker  . Smokeless tobacco: Never Used  Substance Use Topics  . Alcohol use: No    Subjective:  Started on Friday with neck/ upper back pain; no known injury or trauma- woke up with the symptoms; painful to turn her neck; no numbness or tingling radiating into fingertips; notes that pain in her right shoulder is affecting her ability to comfortably wear her CPAP; no chest pain, no shortness of breath.   Has tried OTC Ibuprofen with mild relief;    Objective:  Vitals:   01/24/18 1501  BP: (!) 150/88  Pulse: 95  Temp: 98.5 F (36.9 C)  TempSrc: Oral  SpO2:  96%  Weight: 166 lb (75.3 kg)  Height: _0  (1.549 m)    General: Well developed, well nourished, in no acute distress  Skin : Warm and dry.  Head: Normocephalic and atraumatic  Lungs: Respirations unlabored; clear to auscultation bilaterally without wheeze, rales, rhonchi  CVS exam: normal rate and regular rhythm.  Musculoskeletal: No deformities; no active joint inflammation  Vessels: Symmetric bilaterally  Neurologic: Alert and oriented; speech intact; face symmetrical; moves all extremities well; CNII-XII intact without focal deficit   Assessment:  1. Neck pain   2. Muscle spasm     Plan:  Trial of Mobic 15 mg qd; can also use Robaxin qhs; apply heat/ rest and follow-up worse, no better.   Return in about 1 week (around 01/31/2018) for Dr. Sharlet Salina ( needs 6 month follow-up).  No orders of the defined types were placed in this encounter.   Requested Prescriptions   Signed Prescriptions Disp Refills  . meloxicam (MOBIC) 15 MG tablet 20 tablet 0    Sig: Take 1 tablet (15 mg total) by mouth daily.  . methocarbamol (ROBAXIN) 500 MG tablet 20 tablet 0    Sig: Take 1 tablet (500 mg total) by mouth at bedtime as needed for muscle spasms.

## 2018-01-26 ENCOUNTER — Ambulatory Visit: Payer: Medicare HMO | Admitting: Internal Medicine

## 2018-01-27 ENCOUNTER — Ambulatory Visit
Admission: RE | Admit: 2018-01-27 | Discharge: 2018-01-27 | Disposition: A | Discharge status: Home / Self Care | Payer: Medicare HMO | Source: Ambulatory Visit | Attending: Internal Medicine | Admitting: Internal Medicine

## 2018-01-27 DIAGNOSIS — Z1231 Encounter for screening mammogram for malignant neoplasm of breast: Secondary | ICD-10-CM

## 2018-01-31 ENCOUNTER — Other Ambulatory Visit: Payer: Self-pay | Admitting: Internal Medicine

## 2018-01-31 DIAGNOSIS — R928 Other abnormal and inconclusive findings on diagnostic imaging of breast: Secondary | ICD-10-CM

## 2018-02-02 ENCOUNTER — Ambulatory Visit (INDEPENDENT_AMBULATORY_CARE_PROVIDER_SITE_OTHER): Payer: Medicare HMO | Admitting: Internal Medicine

## 2018-02-02 ENCOUNTER — Encounter: Payer: Self-pay | Admitting: Internal Medicine

## 2018-02-02 ENCOUNTER — Other Ambulatory Visit (INDEPENDENT_AMBULATORY_CARE_PROVIDER_SITE_OTHER): Payer: Medicare HMO

## 2018-02-02 VITALS — BP 118/70 | HR 61 | Temp 97.5°F | Ht 61.0 in | Wt 169.0 lb

## 2018-02-02 DIAGNOSIS — M5030 Other cervical disc degeneration, unspecified cervical region: Secondary | ICD-10-CM

## 2018-02-02 DIAGNOSIS — E785 Hyperlipidemia, unspecified: Secondary | ICD-10-CM

## 2018-02-02 DIAGNOSIS — I10 Essential (primary) hypertension: Secondary | ICD-10-CM

## 2018-02-02 DIAGNOSIS — Z23 Encounter for immunization: Secondary | ICD-10-CM | POA: Diagnosis not present

## 2018-02-02 DIAGNOSIS — E1169 Type 2 diabetes mellitus with other specified complication: Secondary | ICD-10-CM

## 2018-02-02 LAB — CBC
HCT: 36.8 % (ref 36.0–46.0)
Hemoglobin: 12.2 g/dL (ref 12.0–15.0)
MCHC: 33.2 g/dL (ref 30.0–36.0)
MCV: 81.7 fl (ref 78.0–100.0)
Platelets: 234 10*3/uL (ref 150.0–400.0)
RBC: 4.51 Mil/uL (ref 3.87–5.11)
RDW: 14.1 % (ref 11.5–15.5)
WBC: 6.3 10*3/uL (ref 4.0–10.5)

## 2018-02-02 LAB — HEMOGLOBIN A1C: Hgb A1c MFr Bld: 7.5 % — ABNORMAL HIGH (ref 4.6–6.5)

## 2018-02-02 LAB — COMPREHENSIVE METABOLIC PANEL
ALT: 21 U/L (ref 0–35)
AST: 22 U/L (ref 0–37)
Albumin: 3.9 g/dL (ref 3.5–5.2)
Alkaline Phosphatase: 48 U/L (ref 39–117)
BUN: 10 mg/dL (ref 6–23)
CO2: 30 mEq/L (ref 19–32)
Calcium: 10.1 mg/dL (ref 8.4–10.5)
Chloride: 103 mEq/L (ref 96–112)
Creatinine, Ser: 0.8 mg/dL (ref 0.40–1.20)
GFR: 88.54 mL/min (ref >60.00)
Glucose, Bld: 161 mg/dL — ABNORMAL HIGH (ref 70–99)
Potassium: 3.7 mEq/L (ref 3.5–5.1)
Sodium: 141 mEq/L (ref 135–145)
Total Bilirubin: 0.6 mg/dL (ref 0.2–1.2)
Total Protein: 7.7 g/dL (ref 6.0–8.3)

## 2018-02-02 NOTE — Assessment & Plan Note (Signed)
Checking CMP and adjust lisinopril/hctz as needed. BP at goal.

## 2018-02-02 NOTE — Progress Notes (Signed)
   Subjective:    Patient ID: Whitney [name], female    DOB: April 07, 1935, 83 y.o.   MRN: [id number]  HPI The patient is an 83 YO female coming in for follow up of neck pain (seen about 1 week ago and treated with meloxicam and robaxin, she is feeling mostly better, still mild soreness, not taking robaxin anymore still taking meloxicam, using heat as well which helps some, activity is back to normal), and her diabetes (taking glimepiride and metformin and actos for control, denies low sugars, denies new numbness or weakness or tingling in arms or legs, denies headaches or chest pains) and hypertension (taking lisinopril/hctz and BP at goal, denies side effects, denies headaches or chest pains).   Review of Systems  Constitutional: Negative.   HENT: Negative.   Eyes: Negative.   Respiratory: Negative for cough, chest tightness and shortness of breath.   Cardiovascular: Negative for chest pain, palpitations and leg swelling.  Gastrointestinal: Negative for abdominal distention, abdominal pain, constipation, diarrhea, nausea and vomiting.  Musculoskeletal: Negative.   Skin: Negative.   Neurological: Negative.   Psychiatric/Behavioral: Negative.       Objective:   Physical Exam  Constitutional: She is oriented to person, place, and time. She appears well-developed and well-nourished.  HENT:  Head: Normocephalic and atraumatic.  Eyes: EOM are normal.  Neck: Normal range of motion.  Cardiovascular: Normal rate and regular rhythm.  Pulmonary/Chest: Effort normal and breath sounds normal. No respiratory distress. She has no wheezes. She has no rales.  Abdominal: Soft. Bowel sounds are normal. She exhibits no distension. There is no tenderness. There is no rebound.  Musculoskeletal: She exhibits no edema.  Some muscle tightness   Neurological: She is alert and oriented to person, place, and time. Coordination normal.  Skin: Skin is warm and dry.   Vitals:   02/02/18 0901  BP: 118/70    Pulse: 61  Temp: (!) 97.5 F (36.4 C)  TempSrc: Oral  SpO2: 97%  Weight: 169 lb (76.7 kg)  Height: 5\' 1"  (1.549 m)      Assessment & Plan:  Flu shot given at visit

## 2018-02-02 NOTE — Patient Instructions (Signed)
We will check the labs today and call you back about the results.    

## 2018-02-02 NOTE — Assessment & Plan Note (Signed)
Neck pain is improved from before with conservative treatment. Given improvement no indication for x-ray today or additional treatment or therapy.

## 2018-02-02 NOTE — Assessment & Plan Note (Signed)
Checking HgA1c and adjust as needed. Foot exam done today. Reminded about yearly eye exam (she does and up to date). Adjust metformin, glimepiride and actos if needed. On statin and ACE-I.

## 2018-02-03 ENCOUNTER — Ambulatory Visit
Admission: RE | Admit: 2018-02-03 | Discharge: 2018-02-03 | Disposition: A | Discharge status: Home / Self Care | Payer: Medicare HMO | Source: Ambulatory Visit | Attending: Internal Medicine | Admitting: Internal Medicine

## 2018-02-03 ENCOUNTER — Ambulatory Visit: Payer: Medicare HMO

## 2018-02-03 DIAGNOSIS — R928 Other abnormal and inconclusive findings on diagnostic imaging of breast: Secondary | ICD-10-CM | POA: Diagnosis not present

## 2018-02-08 DIAGNOSIS — H35042 Retinal micro-aneurysms, unspecified, left eye: Secondary | ICD-10-CM | POA: Diagnosis not present

## 2018-02-08 DIAGNOSIS — E113312 Type 2 diabetes mellitus with moderate nonproliferative diabetic retinopathy with macular edema, left eye: Secondary | ICD-10-CM | POA: Diagnosis not present

## 2018-02-08 DIAGNOSIS — E113311 Type 2 diabetes mellitus with moderate nonproliferative diabetic retinopathy with macular edema, right eye: Secondary | ICD-10-CM | POA: Diagnosis not present

## 2018-02-08 DIAGNOSIS — H35072 Retinal telangiectasis, left eye: Secondary | ICD-10-CM | POA: Diagnosis not present

## 2018-02-11 DIAGNOSIS — G4733 Obstructive sleep apnea (adult) (pediatric): Secondary | ICD-10-CM | POA: Diagnosis not present

## 2018-02-13 ENCOUNTER — Encounter: Payer: Self-pay | Admitting: Neurology

## 2018-02-14 ENCOUNTER — Telehealth: Payer: Self-pay

## 2018-02-14 NOTE — Telephone Encounter (Signed)
I called pt to discuss. No answer, left a message asking her to call me back. 

## 2018-02-14 NOTE — Telephone Encounter (Signed)
I called pt and reviewed Dr. Guadelupe Sabin recommendations with her. Pt is agreeable to getting a new mask from [facility] and following up in March of 2020. Pt verbalized understanding of recommendations. Pt had no questions at this time but was encouraged to call back if questions arise.

## 2018-02-14 NOTE — Telephone Encounter (Signed)
Pt has returned call to RN Kristen, she is asking for a call back °

## 2018-02-14 NOTE — Telephone Encounter (Signed)
I reviewed patient's most recent AutoPap compliance data from 01/15/2018 through 02/13/2018. We had increased her pressure range on the AutoPap based on her download in September 2019. Her residual AHI is indeed improved and currently borderline at 5.9 per hour, much better compared to 16 per hour before. 95th percentile pressure right around 12 cm, leak on the higher end. Please advise patient to continue on the current pressure settings but she may benefit from a mask refit for which she is encouraged to get in touch with her DME company. Otherwise we will see her back routinelyas scheduled in March 2020.

## 2018-02-14 NOTE — Telephone Encounter (Signed)
6 week cpap therapy report DL pulled from [facility]. Given to Dr. Rexene Alberts for review.

## 2018-02-20 ENCOUNTER — Other Ambulatory Visit: Payer: Self-pay

## 2018-02-23 DIAGNOSIS — E113312 Type 2 diabetes mellitus with moderate nonproliferative diabetic retinopathy with macular edema, left eye: Secondary | ICD-10-CM | POA: Diagnosis not present

## 2018-02-23 DIAGNOSIS — H35072 Retinal telangiectasis, left eye: Secondary | ICD-10-CM | POA: Diagnosis not present

## 2018-02-23 DIAGNOSIS — H43811 Vitreous degeneration, right eye: Secondary | ICD-10-CM | POA: Diagnosis not present

## 2018-02-23 DIAGNOSIS — E113311 Type 2 diabetes mellitus with moderate nonproliferative diabetic retinopathy with macular edema, right eye: Secondary | ICD-10-CM | POA: Diagnosis not present

## 2018-03-01 ENCOUNTER — Other Ambulatory Visit: Payer: Self-pay

## 2018-03-01 MED ORDER — MELOXICAM 15 MG PO TABS: 15.0000 mg | ORAL_TABLET | Freq: Every day | ORAL | 0 refills | Status: DC

## 2018-03-08 DIAGNOSIS — R69 Illness, unspecified: Secondary | ICD-10-CM | POA: Diagnosis not present

## 2018-03-13 DIAGNOSIS — G4733 Obstructive sleep apnea (adult) (pediatric): Secondary | ICD-10-CM | POA: Diagnosis not present

## 2018-03-15 DIAGNOSIS — H35072 Retinal telangiectasis, left eye: Secondary | ICD-10-CM | POA: Diagnosis not present

## 2018-03-15 DIAGNOSIS — E113312 Type 2 diabetes mellitus with moderate nonproliferative diabetic retinopathy with macular edema, left eye: Secondary | ICD-10-CM | POA: Diagnosis not present

## 2018-03-15 DIAGNOSIS — E113311 Type 2 diabetes mellitus with moderate nonproliferative diabetic retinopathy with macular edema, right eye: Secondary | ICD-10-CM | POA: Diagnosis not present

## 2018-03-15 DIAGNOSIS — H35071 Retinal telangiectasis, right eye: Secondary | ICD-10-CM | POA: Diagnosis not present

## 2018-03-22 DIAGNOSIS — G4733 Obstructive sleep apnea (adult) (pediatric): Secondary | ICD-10-CM | POA: Diagnosis not present

## 2018-03-31 DIAGNOSIS — R69 Illness, unspecified: Secondary | ICD-10-CM | POA: Diagnosis not present

## 2018-04-13 DIAGNOSIS — G4733 Obstructive sleep apnea (adult) (pediatric): Secondary | ICD-10-CM | POA: Diagnosis not present

## 2018-04-20 DIAGNOSIS — H43811 Vitreous degeneration, right eye: Secondary | ICD-10-CM | POA: Diagnosis not present

## 2018-04-20 DIAGNOSIS — H35072 Retinal telangiectasis, left eye: Secondary | ICD-10-CM | POA: Diagnosis not present

## 2018-04-20 DIAGNOSIS — E113311 Type 2 diabetes mellitus with moderate nonproliferative diabetic retinopathy with macular edema, right eye: Secondary | ICD-10-CM | POA: Diagnosis not present

## 2018-04-20 DIAGNOSIS — E113312 Type 2 diabetes mellitus with moderate nonproliferative diabetic retinopathy with macular edema, left eye: Secondary | ICD-10-CM | POA: Diagnosis not present

## 2018-04-24 NOTE — Progress Notes (Signed)
Subjective:   Whitney [name] is a 84 y.o. female who presents for Medicare Annual (Subsequent) preventive examination.  Review of Systems:  No ROS.  Medicare Wellness Visit. Additional risk factors are reflected in the social history.  Cardiac Risk Factors include: advanced age (>58mn, >>68women);diabetes mellitus;dyslipidemia;obesity (BMI >30kg/m2) Sleep patterns: feels rested on waking, gets up 1 times nightly to void and sleeps 7-8 hours nightly.    Home Safety/Smoke Alarms: Feels safe in home. Smoke alarms in place.  Living environment; residence and Firearm Safety: 1-story house/ trailer, no firearms. Lives alone, no needs for DME, good support system Seat Belt Safety/Bike Helmet: Wears seat belt.    Objective:     Vitals: BP 132/74   Pulse 72   Resp 17   Ht 5' 1" (1.549 m)   Wt 172 lb (78 kg)   SpO2 98%   BMI 32.50 kg/m   Body mass index is 32.5 kg/m.  Advanced Directives 04/25/2018 04/21/2017 04/15/2016 12/23/2015 12/12/2014 08/30/2014 08/30/2014  Does Patient Have a Medical Advance Directive? No Yes Yes Yes Yes (No Data) Yes  Type of Advance [place]ive - [place];Healthcare Power of [place] - -  Does patient want to make changes to medical advance directive? - - No - Patient declined - - - -  Copy of [place]? - No - copy requested No - copy requested - - - No - copy requested  Would patient like information on creating a medical advance directive? Yes (ED - Information included in AVS) - - - - - -    Tobacco Social History   Tobacco Use  Smoking Status Never Smoker  Smokeless Tobacco Never Used     Counseling given: Not Answered  Past Medical History:  Diagnosis Date  . Anxiety   . Baker's cyst   . Chest pain   . Diabetes mellitus type 2, uncontrolled ([place]   . Diabetes mellitus without complication ([place]   . Disc disease, degenerative, cervical    . Diverticulosis of colon   . DJD (degenerative joint disease) of knee   . Facial pain, atypical   . GERD (gastroesophageal reflux disease)   . Glaucoma   . Hypercholesteremia   . Hypertension   . Lumbar spondylosis   . Obesity   . Onychomycosis   . Osteopenia    Past Surgical History:  Procedure Laterality Date  . right eye surgery  62012/07/09  retina in right eye  . VAGINAL HYSTERECTOMY  2002   Dr. HKem Kaysrepair   Family History  Problem Relation Age of Onset  . Diabetes Mother   . Colon cancer Neg Hx   . Stomach cancer Neg Hx    Social History   Socioeconomic History  . Marital status: Widowed    Spouse name: deceased 607/12/2008 . Number of children: Not on file  . Years of education: Not on file  . Highest education level: Not on file  Occupational History  . Not on file  Social Needs  . Financial resource strain: Not very hard  . Food insecurity:    Worry: Never true    Inability: Never true  . Transportation needs:    Medical: No    Non-medical: No  Tobacco Use  . Smoking status: Never Smoker  . Smokeless tobacco: Never Used  Substance and Sexual Activity  . Alcohol use: No  . Drug use: No  .  Sexual activity: Never  Lifestyle  . Physical activity:    Days per week: 3 days    Minutes per session: 40 min  . Stress: Only a little  Relationships  . Social connections:    Talks on phone: More than three times a week    Gets together: More than three times a week    Attends religious service: More than 4 times per year    Active member of club or organization: Yes    Attends meetings of clubs or organizations: More than 4 times per year    Relationship status: Widowed  Other Topics Concern  . Not on file  Social History Narrative   Lives alone   Spouse passed away 2008/05/21   Son;    3 girls   Clinical cytogeneticist;     Outpatient Encounter Medications as of 04/25/2018  Medication Sig  . aspirin 81 MG tablet Take 1 tablet (81 mg total) by mouth daily.  . blood  glucose meter kit and supplies KIT Use to test blood sugar up to 3 times daily. DX E11.9  . Calcium Carbonate-Vitamin D (CALCIUM-VITAMIN D) 500-200 MG-UNIT per tablet Take 1 tablet by mouth 2 (two) times daily with a meal.  . dorzolamide-timolol (COSOPT) 22.3-6.8 MG/ML ophthalmic solution Place 1 drop into both eyes 2 (two) times daily.   . DUREZOL 0.05 % EMUL   . glimepiride (AMARYL) 4 MG tablet TAKE 1 TABLET EVERY DAY BEFORE BREAKFAST  . glucose blood (ONETOUCH VERIO) test strip Used to check blood sugars three time daily  . ketorolac (ACULAR) 0.4 % SOLN INSTILL 1 DROP INTO EACH EYE QID  . Lancet Devices (ACCU-CHEK SOFTCLIX) lancets Use to test blood sugar up to 3 times a day. DX E11.9  . lisinopril-hydrochlorothiazide (PRINZIDE,ZESTORETIC) 20-12.5 MG tablet Take 1 tablet by mouth daily.  . metFORMIN (GLUCOPHAGE) 1000 MG tablet Take 1 tablet (1,000 mg total) by mouth 2 (two) times daily with a meal.  . Multiple Vitamin (MULTI VITAMIN DAILY PO) Take one by mouth daily  . pioglitazone (ACTOS) 30 MG tablet Take 1 tablet (30 mg total) by mouth daily.  . simvastatin (ZOCOR) 20 MG tablet Take 1 tablet (20 mg total) by mouth at bedtime.  . [DISCONTINUED] latanoprost (XALATAN) 0.005 % ophthalmic solution Place 1 drop into both eyes at bedtime.   . [DISCONTINUED] meloxicam (MOBIC) 15 MG tablet Take 1 tablet (15 mg total) by mouth daily. (Patient not taking: Reported on 04/25/2018)  . [DISCONTINUED] methocarbamol (ROBAXIN) 500 MG tablet Take 1 tablet (500 mg total) by mouth at bedtime as needed for muscle spasms. (Patient not taking: Reported on 04/25/2018)   No facility-administered encounter medications on file as of 04/25/2018.     Activities of Daily Living In your present state of health, do you have any difficulty performing the following activities: 04/25/2018  Hearing? N  Vision? Y  Difficulty concentrating or making decisions? N  Walking or climbing stairs? N  Dressing or bathing? N  Doing  errands, shopping? N  Preparing Food and eating ? N  Using the Toilet? N  In the past six months, have you accidently leaked urine? N  Do you have problems with loss of bowel control? N  Managing your Medications? N  Managing your Finances? N  Housekeeping or managing your Housekeeping? N  Some recent data might be hidden    Patient Care Team: [name], MD as PCP - General (Internal Medicine) Noralee Space, MD as Consulting Physician (Pulmonary Disease)  Gean Birchwood, DPM as Consulting Physician (Podiatry)    Assessment:   This is a routine wellness examination for Kamali. Physical assessment deferred to PCP.  Exercise Activities and Dietary recommendations Current Exercise Habits: The patient does not participate in regular exercise at present, Exercise limited by: orthopedic condition(s)  Diet (meal preparation, eat out, water intake, caffeinated beverages, dairy products, fruits and vegetables): in general, a "healthy" diet     Reviewed and dicussed at length with patient diabetes diet. Relevant patient education assigned to patient using Emmi. Encouraged patient to increase daily water and healthy fluid intake.  Goals      Patient Stated   . <enter goal here> (pt-stated)     "get my A1C down" by continuing to exercise and watching carb intake.       Other   . Patient Stated     Get my blood sugar under control. Start to count carbohydrates and sugar. Go to free diabetic support classes offered by [facility].     . Patient Stated     Work toward lowering my HGB. A1c by monitoring my diet closely and perhaps going to [facility] education classes.       Fall Risk Fall Risk  04/25/2018 12/29/2017 09/06/2017 04/21/2017 04/15/2016  Falls in the past year? 0 No No No Yes  Number falls in past yr: - - - - 1  Injury with Fall? - - - - No  Risk for fall due to : - - - - -  Risk for fall due to: Comment - - - - -  Follow up - - - - Falls prevention discussed      Depression Screen PHQ 2/9 Scores 04/25/2018 04/21/2017 04/15/2016 12/23/2015  PHQ - 2 Score 0 0 0 0  PHQ- 9 Score - 0 - -     Cognitive Function MMSE - Mini Mental State Exam 04/21/2017 08/30/2014  Not completed: - Unable to complete  Orientation to time 5 -  Orientation to Place 5 -  Registration 3 -  Attention/ Calculation 4 -  Recall 2 -  Language- name 2 objects 2 -  Language- repeat 1 -  Language- follow 3 step command 3 -  Language- read & follow direction 1 -  Write a sentence 1 -  Copy design 1 -  Total score 28 -       Ad8 score reviewed for issues:  Issues making decisions: no  Less interest in hobbies / activities: no  Repeats questions, stories (family complaining): no  Trouble using ordinary gadgets (microwave, computer, phone):no  Forgets the month or year: no  Mismanaging finances: no  Remembering appts: no  Daily problems with thinking and/or memory: no Ad8 score is= 0  Immunization History  Administered Date(s) Administered  . Influenza Split 02/18/2011, 02/23/2012  . Influenza Whole 01/19/2005, 02/17/2009, 02/16/2010  . Influenza, High Dose Seasonal PF 02/14/2015, 04/21/2017, 02/02/2018  . Influenza,inj,Quad PF,6+ Mos 03/12/2013, 02/07/2014  . Pneumococcal Conjugate-13 05/25/2016  . Pneumococcal Polysaccharide-23 04/12/1998, 02/16/2010  . Td 08/21/2002  . Tdap 08/23/2011   Screening Tests Health Maintenance  Topic Date Due  . OPHTHALMOLOGY EXAM  12/02/2018  . FOOT EXAM  02/03/2019  . TETANUS/TDAP  08/22/2021  . INFLUENZA VACCINE  Completed  . DEXA SCAN  Completed  . PNA vac Low Risk Adult  Completed      Plan:    Resource provided for free diabetes 2 support class through [facility].   Reviewed health maintenance  screenings with patient today and relevant education, vaccines, and/or referrals were provided.   Continue doing brain stimulating activities (puzzles, reading, adult coloring books, staying active) to keep  memory sharp.   Continue to eat heart healthy diet (full of fruits, vegetables, whole grains, lean protein, water--limit salt, fat, and sugar intake) and increase physical activity as tolerated.  I have personally reviewed and noted the following in the patient's chart:   . Medical and social history . Use of alcohol, tobacco or illicit drugs  . Current medications and supplements . Functional ability and status . Nutritional status . Physical activity . Advanced directives . List of other physicians . Vitals . Screenings to include cognitive, depression, and falls . Referrals and appointments  In addition, I have reviewed and discussed with patient certain preventive protocols, quality metrics, and best practice recommendations. A written personalized care plan for preventive services as well as general preventive health recommendations were provided to patient.     Michiel Cowboy, RN  04/25/2018

## 2018-04-25 ENCOUNTER — Ambulatory Visit (INDEPENDENT_AMBULATORY_CARE_PROVIDER_SITE_OTHER): Payer: Medicare HMO | Admitting: *Deleted

## 2018-04-25 VITALS — BP 132/74 | HR 72 | Resp 17 | Ht 61.0 in | Wt 172.0 lb

## 2018-04-25 DIAGNOSIS — Z0000 Encounter for general adult medical examination without abnormal findings: Secondary | ICD-10-CM | POA: Diagnosis not present

## 2018-04-25 NOTE — Patient Instructions (Addendum)
Continue doing brain stimulating activities (puzzles, reading, adult coloring books, staying active) to keep memory sharp.   Continue to eat heart healthy diet (full of fruits, vegetables, whole grains, lean protein, water--limit salt, fat, and sugar intake) and increase physical activity as tolerated.  This support group for Type 2 diabetics and their family members addresses a wide range of topics related to diabetes. Registration Details Call [phone] for more information. Fees & Payment This program is free. Marland Kitchen Description . Schedule & Location . Related Events This is a free support group for individuals with Type 2 Diabetes and their family members. The group meets from 6 to 7 p.m. on the second Monday of each month. Check-in is at 5:45 p.m. The meetings are held at [place] [facility] [place] located at [facility] at [employer], [place], fourth floor conference room.  Type 1 Diabetes support group first Wednesday of every month 6-7 PM [phone] Please arrive 10 minutes early [address]  Ms. Funderburg , Thank you for taking time to come for your Medicare Wellness Visit. I appreciate your ongoing commitment to your health goals. Please review the following plan we discussed and let me know if I can assist you in the future.   These are the goals we discussed: Goals      Patient Stated   . <enter goal here> (pt-stated)     "get my A1C down" by continuing to exercise and watching carb intake.       Other   . Patient Stated     Get my blood sugar under control. Start to count carbohydrates and sugar. Go to free diabetic support classes offered by [facility].     . Patient Stated     Work toward lowering my HGB. A1c by monitoring my diet closely and perhaps going to [facility] education classes.       This is a list of the screening recommended for you and due dates:  Health Maintenance   Topic Date Due  . Eye exam for diabetics  12/02/2018  . Complete foot exam   02/03/2019  . Tetanus Vaccine  08/22/2021  . Flu Shot  Completed  . DEXA scan (bone density measurement)  Completed  . Pneumonia vaccines  Completed     Health Maintenance, Female Adopting a healthy lifestyle and getting preventive care can go a long way to promote health and wellness. Talk with your health care provider about what schedule of regular examinations is right for you. This is a good chance for you to check in with your provider about disease prevention and staying healthy. In between checkups, there are plenty of things you can do on your own. Experts have done a lot of research about which lifestyle changes and preventive measures are most likely to keep you healthy. Ask your health care provider for more information. Weight and diet Eat a healthy diet  Be sure to include plenty of vegetables, fruits, low-fat dairy products, and lean protein.  Do not eat a lot of foods high in solid fats, added sugars, or salt.  Get regular exercise. This is one of the most important things you can do for your health. ? Most adults should exercise for at least 150 minutes each week. The exercise should increase your heart rate and make you sweat (moderate-intensity exercise). ? Most adults should also do strengthening exercises at least twice a week. This is in addition  to the moderate-intensity exercise. Maintain a healthy weight  Body mass index (BMI) is a measurement that can be used to identify possible weight problems. It estimates body fat based on height and weight. Your health care provider can help determine your BMI and help you achieve or maintain a healthy weight.  For females 23 years of age and older: ? A BMI below 18.5 is considered underweight. ? A BMI of 18.5 to 24.9 is normal. ? A BMI of 25 to 29.9 is considered overweight. ? A BMI of 30 and above is considered obese. Watch levels of  cholesterol and blood lipids  You should start having your blood tested for lipids and cholesterol at 82 years of age, then have this test every 5 years.  You may need to have your cholesterol levels checked more often if: ? Your lipid or cholesterol levels are high. ? You are older than 81 years of age. ? You are at high risk for heart disease. Cancer screening Lung Cancer  Lung cancer screening is recommended for adults 71-78 years old who are at high risk for lung cancer because of a history of smoking.  A yearly low-dose CT scan of the lungs is recommended for people who: ? Currently smoke. ? Have quit within the past 15 years. ? Have at least a 30-pack-year history of smoking. A pack year is smoking an average of one pack of cigarettes a day for 1 year.  Yearly screening should continue until it has been 15 years since you quit.  Yearly screening should stop if you develop a health problem that would prevent you from having lung cancer treatment. Breast Cancer  Practice breast self-awareness. This means understanding how your breasts normally appear and feel.  It also means doing regular breast self-exams. Let your health care provider know about any changes, no matter how small.  If you are in your 20s or 30s, you should have a clinical breast exam (CBE) by a health care provider every 1-3 years as part of a regular health exam.  If you are 27 or older, have a CBE every year. Also consider having a breast X-ray (mammogram) every year.  If you have a family history of breast cancer, talk to your health care provider about genetic screening.  If you are at high risk for breast cancer, talk to your health care provider about having an MRI and a mammogram every year.  Breast cancer gene (BRCA) assessment is recommended for women who have family members with BRCA-related cancers. BRCA-related cancers include: ? Breast. ? Ovarian. ? Tubal. ? Peritoneal cancers.  Results of  the assessment will determine the need for genetic counseling and BRCA1 and BRCA2 testing. Cervical Cancer Your health care provider may recommend that you be screened regularly for cancer of the pelvic organs (ovaries, uterus, and vagina). This screening involves a pelvic examination, including checking for microscopic changes to the surface of your cervix (Pap test). You may be encouraged to have this screening done every 3 years, beginning at age 3.  For women ages 36-65, health care providers may recommend pelvic exams and Pap testing every 3 years, or they may recommend the Pap and pelvic exam, combined with testing for human papilloma virus (HPV), every 5 years. Some types of HPV increase your risk of cervical cancer. Testing for HPV may also be done on women of any age with unclear Pap test results.  Other health care providers may not recommend any screening for nonpregnant women  who are considered low risk for pelvic cancer and who do not have symptoms. Ask your health care provider if a screening pelvic exam is right for you.  If you have had past treatment for cervical cancer or a condition that could lead to cancer, you need Pap tests and screening for cancer for at least 20 years after your treatment. If Pap tests have been discontinued, your risk factors (such as having a new sexual partner) need to be reassessed to determine if screening should resume. Some women have medical problems that increase the chance of getting cervical cancer. In these cases, your health care provider may recommend more frequent screening and Pap tests. Colorectal Cancer  This type of cancer can be detected and often prevented.  Routine colorectal cancer screening usually begins at 82 years of age and continues through 82 years of age.  Your health care provider may recommend screening at an earlier age if you have risk factors for colon cancer.  Your health care provider may also recommend using home test  kits to check for hidden blood in the stool.  A small camera at the end of a tube can be used to examine your colon directly (sigmoidoscopy or colonoscopy). This is done to check for the earliest forms of colorectal cancer.  Routine screening usually begins at age 73.  Direct examination of the colon should be repeated every 5-10 years through 82 years of age. However, you may need to be screened more often if early forms of precancerous polyps or small growths are found. Skin Cancer  Check your skin from head to toe regularly.  Tell your health care provider about any new moles or changes in moles, especially if there is a change in a mole's shape or color.  Also tell your health care provider if you have a mole that is larger than the size of a pencil eraser.  Always use sunscreen. Apply sunscreen liberally and repeatedly throughout the day.  Protect yourself by wearing long sleeves, pants, a wide-brimmed hat, and sunglasses whenever you are outside. Heart disease, diabetes, and high blood pressure  High blood pressure causes heart disease and increases the risk of stroke. High blood pressure is more likely to develop in: ? People who have blood pressure in the high end of the normal range (130-139/85-89 mm Hg). ? People who are overweight or obese. ? People who are African American.  If you are 73-29 years of age, have your blood pressure checked every 3-5 years. If you are 85 years of age or older, have your blood pressure checked every year. You should have your blood pressure measured twice-once when you are at a hospital or clinic, and once when you are not at a hospital or clinic. Record the average of the two measurements. To check your blood pressure when you are not at a hospital or clinic, you can use: ? An automated blood pressure machine at a pharmacy. ? A home blood pressure monitor.  If you are between 69 years and 46 years old, ask your health care provider if you should  take aspirin to prevent strokes.  Have regular diabetes screenings. This involves taking a blood sample to check your fasting blood sugar level. ? If you are at a normal weight and have a low risk for diabetes, have this test once every three years after 82 years of age. ? If you are overweight and have a high risk for diabetes, consider being tested at a younger  age or more often. Preventing infection Hepatitis B  If you have a higher risk for hepatitis B, you should be screened for this virus. You are considered at high risk for hepatitis B if: ? You were born in a country where hepatitis B is common. Ask your health care provider which countries are considered high risk. ? Your parents were born in a high-risk country, and you have not been immunized against hepatitis B (hepatitis B vaccine). ? You have HIV or AIDS. ? You use needles to inject street drugs. ? You live with someone who has hepatitis B. ? You have had sex with someone who has hepatitis B. ? You get hemodialysis treatment. ? You take certain medicines for conditions, including cancer, organ transplantation, and autoimmune conditions. Hepatitis C  Blood testing is recommended for: ? Everyone born from [birth year] through 1963. ? Anyone with known risk factors for hepatitis C. Sexually transmitted infections (STIs)  You should be screened for sexually transmitted infections (STIs) including gonorrhea and chlamydia if: ? You are sexually active and are younger than 82 years of age. ? You are older than 82 years of age and your health care provider tells you that you are at risk for this type of infection. ? Your sexual activity has changed since you were last screened and you are at an increased risk for chlamydia or gonorrhea. Ask your health care provider if you are at risk.  If you do not have HIV, but are at risk, it may be recommended that you take a prescription medicine daily to prevent HIV infection. This is called  pre-exposure prophylaxis (PrEP). You are considered at risk if: ? You are sexually active and do not regularly use condoms or know the HIV status of your partner(s). ? You take drugs by injection. ? You are sexually active with a partner who has HIV. Talk with your health care provider about whether you are at high risk of being infected with HIV. If you choose to begin PrEP, you should first be tested for HIV. You should then be tested every 3 months for as long as you are taking PrEP. Pregnancy  If you are premenopausal and you may become pregnant, ask your health care provider about preconception counseling.  If you may become pregnant, take 400 to 800 micrograms (mcg) of folic acid every day.  If you want to prevent pregnancy, talk to your health care provider about birth control (contraception). Osteoporosis and menopause  Osteoporosis is a disease in which the bones lose minerals and strength with aging. This can result in serious bone fractures. Your risk for osteoporosis can be identified using a bone density scan.  If you are 83 years of age or older, or if you are at risk for osteoporosis and fractures, ask your health care provider if you should be screened.  Ask your health care provider whether you should take a calcium or vitamin D supplement to lower your risk for osteoporosis.  Menopause may have certain physical symptoms and risks.  Hormone replacement therapy may reduce some of these symptoms and risks. Talk to your health care provider about whether hormone replacement therapy is right for you. Follow these instructions at home:  Schedule regular health, dental, and eye exams.  Stay current with your immunizations.  Do not use any tobacco products including cigarettes, chewing tobacco, or electronic cigarettes.  If you are pregnant, do not drink alcohol.  If you are breastfeeding, limit how much and how often you  drink alcohol.  Limit alcohol intake to no more  than 1 drink per day for nonpregnant women. One drink equals 12 ounces of beer, 5 ounces of Carmin Alvidrez, or 1 ounces of hard liquor.  Do not use street drugs.  Do not share needles.  Ask your health care provider for help if you need support or information about quitting drugs.  Tell your health care provider if you often feel depressed.  Tell your health care provider if you have ever been abused or do not feel safe at home. This information is not intended to replace advice given to you by your health care provider. Make sure you discuss any questions you have with your health care provider. Document Released: 10/12/2010 Document Revised: 09/04/2015 Document Reviewed: 12/31/2014 Elsevier Interactive Patient Education  2019 [employer].

## 2018-04-25 NOTE — Progress Notes (Signed)
Medical screening examination/treatment/procedure(s) were performed by non-physician practitioner and as supervising physician I was immediately available for consultation/collaboration. I agree with above. Israel Werts A Valery Chance, MD 

## 2018-05-14 DIAGNOSIS — G4733 Obstructive sleep apnea (adult) (pediatric): Secondary | ICD-10-CM | POA: Diagnosis not present

## 2018-05-23 DIAGNOSIS — R69 Illness, unspecified: Secondary | ICD-10-CM | POA: Diagnosis not present

## 2018-05-26 ENCOUNTER — Other Ambulatory Visit: Payer: Self-pay | Admitting: Podiatry

## 2018-05-26 ENCOUNTER — Ambulatory Visit (INDEPENDENT_AMBULATORY_CARE_PROVIDER_SITE_OTHER): Payer: Medicare HMO

## 2018-05-26 ENCOUNTER — Ambulatory Visit: Payer: Medicare HMO | Admitting: Podiatry

## 2018-05-26 ENCOUNTER — Encounter: Payer: Self-pay | Admitting: Podiatry

## 2018-05-26 DIAGNOSIS — R600 Localized edema: Secondary | ICD-10-CM | POA: Diagnosis not present

## 2018-05-26 DIAGNOSIS — M79671 Pain in right foot: Secondary | ICD-10-CM

## 2018-05-26 DIAGNOSIS — M779 Enthesopathy, unspecified: Secondary | ICD-10-CM

## 2018-05-28 NOTE — Progress Notes (Signed)
Subjective:   Patient ID: Whitney [name], female   DOB: 84 y.o.   MRN: [id number]   HPI Patient states she had quite a bit of pain on top of her right foot and was concerned about fracture or acute injury and states is feeling some better now but wants it checked   ROS      Objective:  Physical Exam  Neurovascular status intact with inflammation of the right foot which is present but improved from where it was several weeks ago with mild edema noted local     Assessment:  Possibility for stress fracture or acute bone injury versus a inflammatory or tenderness condition     Plan:  H&P x-rays reviewed and today have advised on heat therapy along with compression and wider shoes and patient will be seen back for [place] to recheck on an as-needed basis  X-rays were negative for signs of fracture or bony pathology

## 2018-06-08 DIAGNOSIS — H35042 Retinal micro-aneurysms, unspecified, left eye: Secondary | ICD-10-CM | POA: Diagnosis not present

## 2018-06-08 DIAGNOSIS — H3562 Retinal hemorrhage, left eye: Secondary | ICD-10-CM | POA: Diagnosis not present

## 2018-06-08 DIAGNOSIS — H35072 Retinal telangiectasis, left eye: Secondary | ICD-10-CM | POA: Diagnosis not present

## 2018-06-08 DIAGNOSIS — E113312 Type 2 diabetes mellitus with moderate nonproliferative diabetic retinopathy with macular edema, left eye: Secondary | ICD-10-CM | POA: Diagnosis not present

## 2018-06-15 DIAGNOSIS — H35041 Retinal micro-aneurysms, unspecified, right eye: Secondary | ICD-10-CM | POA: Diagnosis not present

## 2018-06-15 DIAGNOSIS — E113311 Type 2 diabetes mellitus with moderate nonproliferative diabetic retinopathy with macular edema, right eye: Secondary | ICD-10-CM | POA: Diagnosis not present

## 2018-06-15 DIAGNOSIS — H35071 Retinal telangiectasis, right eye: Secondary | ICD-10-CM | POA: Diagnosis not present

## 2018-06-15 DIAGNOSIS — H43811 Vitreous degeneration, right eye: Secondary | ICD-10-CM | POA: Diagnosis not present

## 2018-06-29 ENCOUNTER — Ambulatory Visit: Payer: Medicare HMO | Admitting: Nurse Practitioner

## 2018-07-06 ENCOUNTER — Other Ambulatory Visit: Payer: Self-pay

## 2018-07-06 ENCOUNTER — Telehealth: Payer: Self-pay | Admitting: Internal Medicine

## 2018-07-06 MED ORDER — DORZOLAMIDE HCL-TIMOLOL MAL 2-0.5 % OP SOLN: 1.0000 [drp] | Freq: Two times a day (BID) | OPHTHALMIC | 0 refills | Status: DC

## 2018-07-06 MED ORDER — GLUCOSE BLOOD VI STRP: ORAL_STRIP | 1 refills | Status: DC

## 2018-07-06 MED ORDER — PIOGLITAZONE HCL 30 MG PO TABS: 30.0000 mg | ORAL_TABLET | Freq: Every day | ORAL | 1 refills | Status: DC

## 2018-07-06 MED ORDER — METFORMIN HCL 1000 MG PO TABS: 1000.0000 mg | ORAL_TABLET | Freq: Two times a day (BID) | ORAL | 1 refills | Status: DC

## 2018-07-06 MED ORDER — SIMVASTATIN 20 MG PO TABS: 20.0000 mg | ORAL_TABLET | Freq: Every day | ORAL | 1 refills | Status: DC

## 2018-07-06 MED ORDER — CALCIUM-VITAMIN D 500-200 MG-UNIT PO TABS: 1.0000 | ORAL_TABLET | Freq: Two times a day (BID) | ORAL | 1 refills | Status: AC

## 2018-07-06 MED ORDER — ASPIRIN 81 MG PO TABS: 81.0000 mg | ORAL_TABLET | Freq: Every day | ORAL | 1 refills | Status: AC

## 2018-07-06 MED ORDER — BLOOD GLUCOSE MONITOR KIT: PACK | 0 refills | Status: DC

## 2018-07-06 MED ORDER — GLIMEPIRIDE 4 MG PO TABS: ORAL_TABLET | ORAL | 1 refills | Status: DC

## 2018-07-06 MED ORDER — LISINOPRIL-HYDROCHLOROTHIAZIDE 20-12.5 MG PO TABS: 1.0000 | ORAL_TABLET | Freq: Every day | ORAL | 1 refills | Status: DC

## 2018-07-06 NOTE — Addendum Note (Signed)
Addended by: Ander Slade on: 07/06/2018 03:46 PM   Modules accepted: Orders

## 2018-07-06 NOTE — Telephone Encounter (Signed)
Routing to dr crawford, are you ok with doing this, please advise, thanks

## 2018-07-06 NOTE — Telephone Encounter (Signed)
Fine to send in #90 with 1 refill

## 2018-07-06 NOTE — Telephone Encounter (Signed)
All [facility] per patient request

## 2018-07-06 NOTE — Telephone Encounter (Signed)
Copied from [place] [id number]. Topic: General - Other >> Jul 06, 2018  2:08 PM Reyne Dumas L wrote: Reason for CRM:   Pt called and left voicemail on [place] - states that she has new insurance and wants to know if that will effect her medication. Pt can be reached at [phone] >> Jul 06, 2018  2:49 PM [place], Isidoro Donning wrote: Informed patient that she should contact her pharmacy to find out how it will effect her medications.   Patient said that her insurance has changed to [employer] so all scripts will need to go through [employer].   She is needing all of her current medications sent to [facility] because she is completely out at this time.

## 2018-07-11 ENCOUNTER — Telehealth: Payer: Self-pay | Admitting: Internal Medicine

## 2018-07-11 NOTE — Telephone Encounter (Signed)
Called pharmacy and given okay to switch to 90 day supply

## 2018-07-11 NOTE — Telephone Encounter (Signed)
Whitney [name] called from [facility], stating that because they are a mail order pharmacy they are required to send a #90 day supply so he wanted to know if it was ok to send 2 bottles of the Dorzolamide-timolol instead of the #1 that was written on rx.  Also he stated that accucheck test supplies is not covered by pt's insurance, and they want to know if they can change to onetouch verio with Qty: 300  Call back number: [phone] number to reach him

## 2018-07-17 DIAGNOSIS — G4733 Obstructive sleep apnea (adult) (pediatric): Secondary | ICD-10-CM | POA: Diagnosis not present

## 2018-07-25 ENCOUNTER — Telehealth: Payer: Self-pay

## 2018-07-25 NOTE — Telephone Encounter (Signed)
Would recommend to wait until CPE

## 2018-07-25 NOTE — Telephone Encounter (Signed)
Patient informed and stated understanding.

## 2018-07-25 NOTE — Telephone Encounter (Signed)
Called patient to reschedule Friday CPE and she wanted to know about her A1C should she get it checked before her reschedule CPE in June or is she okay to wait till then?

## 2018-07-27 DIAGNOSIS — E113312 Type 2 diabetes mellitus with moderate nonproliferative diabetic retinopathy with macular edema, left eye: Secondary | ICD-10-CM | POA: Diagnosis not present

## 2018-07-27 DIAGNOSIS — E113311 Type 2 diabetes mellitus with moderate nonproliferative diabetic retinopathy with macular edema, right eye: Secondary | ICD-10-CM | POA: Diagnosis not present

## 2018-07-27 DIAGNOSIS — H35072 Retinal telangiectasis, left eye: Secondary | ICD-10-CM | POA: Diagnosis not present

## 2018-07-27 DIAGNOSIS — H35042 Retinal micro-aneurysms, unspecified, left eye: Secondary | ICD-10-CM | POA: Diagnosis not present

## 2018-07-28 ENCOUNTER — Encounter: Payer: Medicare HMO | Admitting: Internal Medicine

## 2018-08-01 DIAGNOSIS — H43811 Vitreous degeneration, right eye: Secondary | ICD-10-CM | POA: Diagnosis not present

## 2018-08-01 DIAGNOSIS — E113311 Type 2 diabetes mellitus with moderate nonproliferative diabetic retinopathy with macular edema, right eye: Secondary | ICD-10-CM | POA: Diagnosis not present

## 2018-08-01 DIAGNOSIS — H35041 Retinal micro-aneurysms, unspecified, right eye: Secondary | ICD-10-CM | POA: Diagnosis not present

## 2018-08-01 DIAGNOSIS — H35071 Retinal telangiectasis, right eye: Secondary | ICD-10-CM | POA: Diagnosis not present

## 2018-08-08 ENCOUNTER — Telehealth: Payer: Self-pay | Admitting: Internal Medicine

## 2018-08-08 NOTE — Telephone Encounter (Signed)
Copied from [place] [phone]. Topic: Quick Communication - See Telephone Encounter >> Aug 08, 2018  2:57 PM Rutherford Nail, NT wrote: CRM for notification. See Telephone encounter for: 08/08/18. Janett Billow with [employer] calling and states that she has sent over multiple faxes regarding information for a program offered through [employer]. States that she is needing to know if the patient has the 2 qualifiers for this program, which are diabetes or congestive heart failure? Please advise. Can call the number below and give the information verbally to the concierge.  CB#: [phone]

## 2018-08-10 NOTE — Telephone Encounter (Signed)
Called and let them know patient does have confirmed diagnoses of diabetes

## 2018-08-16 DIAGNOSIS — G4733 Obstructive sleep apnea (adult) (pediatric): Secondary | ICD-10-CM | POA: Diagnosis not present

## 2018-09-06 ENCOUNTER — Other Ambulatory Visit: Payer: Self-pay

## 2018-09-06 DIAGNOSIS — E113312 Type 2 diabetes mellitus with moderate nonproliferative diabetic retinopathy with macular edema, left eye: Secondary | ICD-10-CM | POA: Diagnosis not present

## 2018-09-06 DIAGNOSIS — H35072 Retinal telangiectasis, left eye: Secondary | ICD-10-CM | POA: Diagnosis not present

## 2018-09-06 DIAGNOSIS — H35042 Retinal micro-aneurysms, unspecified, left eye: Secondary | ICD-10-CM | POA: Diagnosis not present

## 2018-09-06 NOTE — Patient Outreach (Signed)
Health Risk Assessment: Late Entry: 07/13/2018  Outreach attempt. No answer 07/17/2018  Outreach attempt successful.  Patient reports that she is doing well. Reports history of DM. Reports she is on oral medications. Fasting CBG 183 on 07/17/2018.  Reports she is following up closely with MD.  Reviewed pharmacy program when patient mentioned running out of medications in 1 week. Patient declined pharmacy services. Reports no current needs in which she has agreed to get assistance with.  PLAN: will mail welcome letter and call back in 6 months for follow up.  Tomasa Rand, RN, BSN, CEN [facility] [employer] [phone]

## 2018-09-16 DIAGNOSIS — G4733 Obstructive sleep apnea (adult) (pediatric): Secondary | ICD-10-CM | POA: Diagnosis not present

## 2018-09-19 ENCOUNTER — Other Ambulatory Visit: Payer: Self-pay

## 2018-09-19 DIAGNOSIS — H35071 Retinal telangiectasis, right eye: Secondary | ICD-10-CM | POA: Diagnosis not present

## 2018-09-19 DIAGNOSIS — H43811 Vitreous degeneration, right eye: Secondary | ICD-10-CM | POA: Diagnosis not present

## 2018-09-19 DIAGNOSIS — E113311 Type 2 diabetes mellitus with moderate nonproliferative diabetic retinopathy with macular edema, right eye: Secondary | ICD-10-CM | POA: Diagnosis not present

## 2018-09-19 NOTE — Patient Outreach (Signed)
  [facility]) Care Management Chronic Special Needs Program  09/19/2018  Name: CARAN STORCK DOB: 1934-12-15  MRN: [id number]  Ms. Meya Clutter is enrolled in a chronic special needs plan for Diabetes. Chronic Care Management Coordinator telephoned client to review health risk assessment and to develop individualized care plan.  Introduced the chronic care management program, importance of client participation, and taking their care plan to all provider appointments and inpatient facilities.   Subjective: client reports a history of diabetes and hypertension. She states her A1C is 7.4. She client denies any difficulty with medication management or affording medications. Ms. Trussell states she lives alone, but her children are supportive, but all of them work. She reports her son takes her to her appointments, but she is able to drive herself to church as it is not far from her house. She denies any issues or concerns at this time.    Goals Addressed            This Visit's Progress   . Client understands the importance of follow-up with providers by attending scheduled visits      . COMPLETED: Client will use Assistive Devices as needed and verbalize understanding of device use       Denies any issues with assistive devices    . Client will verbalize knowledge of self management of Hypertension as evidences by BP reading of 140/90 or less; or as defined by provider      . HEMOGLOBIN A1C < 7.0      . Maintain timely refills of diabetic medication as prescribed within the year .      Marland Kitchen Obtain annual  Lipid Profile, LDL-C      . Obtain Annual Eye (retinal)  Exam       . Obtain Annual Foot Exam      . Obtain annual screen for micro albuminuria (urine) , nephropathy (kidney problems)      . Obtain Hemoglobin A1C at least 2 times per year      . Visit Primary Care Provider or Endocrinologist at least 2 times per year         Covid 19 precautions discussed. RNCM  reinforced the 24 hour nurse advice line. RNCM encouraged client to contact health care concierge for benefit questions. Client encouraged to call RNCM as needed/Confirmed he has contact number.  Plan:  Send successful outreach letter with a copy of their individualized care plan, Send individual care plan to provider and Send educational material. Chronic care management coordination will outreach in: 6 months.   Thea Silversmith, RN, MSN, [place] [phone]

## 2018-09-20 ENCOUNTER — Other Ambulatory Visit: Payer: Self-pay

## 2018-09-20 NOTE — Patient Outreach (Signed)
Case closed for community care management:  Received call from Thea Silversmith, [facility] C-SNP coordinator, that patient is active in the C- SNP program. Requested me to close to community and the engagement program.  PLAN:close case.  Tomasa Rand, RN, BSN, CEN [facility] [employer] [phone]

## 2018-09-29 ENCOUNTER — Other Ambulatory Visit (INDEPENDENT_AMBULATORY_CARE_PROVIDER_SITE_OTHER): Payer: HMO

## 2018-09-29 ENCOUNTER — Other Ambulatory Visit: Payer: Self-pay

## 2018-09-29 ENCOUNTER — Encounter: Payer: Self-pay | Admitting: Internal Medicine

## 2018-09-29 ENCOUNTER — Ambulatory Visit (INDEPENDENT_AMBULATORY_CARE_PROVIDER_SITE_OTHER): Payer: HMO | Admitting: Internal Medicine

## 2018-09-29 VITALS — BP 130/90 | HR 66 | Temp 97.8°F | Ht 61.0 in | Wt 168.0 lb

## 2018-09-29 DIAGNOSIS — Z0000 Encounter for general adult medical examination without abnormal findings: Secondary | ICD-10-CM

## 2018-09-29 DIAGNOSIS — G4733 Obstructive sleep apnea (adult) (pediatric): Secondary | ICD-10-CM

## 2018-09-29 DIAGNOSIS — Z9989 Dependence on other enabling machines and devices: Secondary | ICD-10-CM | POA: Diagnosis not present

## 2018-09-29 DIAGNOSIS — E785 Hyperlipidemia, unspecified: Secondary | ICD-10-CM

## 2018-09-29 DIAGNOSIS — E1169 Type 2 diabetes mellitus with other specified complication: Secondary | ICD-10-CM | POA: Diagnosis not present

## 2018-09-29 LAB — LIPID PANEL
Cholesterol: 119 mg/dL (ref 0–200)
HDL: 38.1 mg/dL — ABNORMAL LOW (ref >39.00)
LDL Cholesterol: 55 mg/dL (ref 0–99)
NonHDL: 80.89
Total CHOL/HDL Ratio: 3
Triglycerides: 131 mg/dL (ref 0.0–149.0)
VLDL: 26.2 mg/dL (ref 0.0–40.0)

## 2018-09-29 LAB — COMPREHENSIVE METABOLIC PANEL
ALT: 17 U/L (ref 0–35)
AST: 19 U/L (ref 0–37)
Albumin: 3.9 g/dL (ref 3.5–5.2)
Alkaline Phosphatase: 47 U/L (ref 39–117)
BUN: 10 mg/dL (ref 6–23)
CO2: 29 mEq/L (ref 19–32)
Calcium: 10.1 mg/dL (ref 8.4–10.5)
Chloride: 105 mEq/L (ref 96–112)
Creatinine, Ser: 0.75 mg/dL (ref 0.40–1.20)
GFR: 89.6 mL/min (ref >60.00)
Glucose, Bld: 149 mg/dL — ABNORMAL HIGH (ref 70–99)
Potassium: 4.1 mEq/L (ref 3.5–5.1)
Sodium: 142 mEq/L (ref 135–145)
Total Bilirubin: 0.7 mg/dL (ref 0.2–1.2)
Total Protein: 7.4 g/dL (ref 6.0–8.3)

## 2018-09-29 LAB — CBC
HCT: 37.4 % (ref 36.0–46.0)
Hemoglobin: 12.4 g/dL (ref 12.0–15.0)
MCHC: 33.1 g/dL (ref 30.0–36.0)
MCV: 81.6 fl (ref 78.0–100.0)
Platelets: 193 10*3/uL (ref 150.0–400.0)
RBC: 4.58 Mil/uL (ref 3.87–5.11)
RDW: 15 % (ref 11.5–15.5)
WBC: 6.5 10*3/uL (ref 4.0–10.5)

## 2018-09-29 LAB — HEMOGLOBIN A1C: Hgb A1c MFr Bld: 7.4 % — ABNORMAL HIGH (ref 4.6–6.5)

## 2018-09-29 MED ORDER — DICLOFENAC SODIUM 1 % TD GEL: 4.0000 g | Freq: Four times a day (QID) | TRANSDERMAL | 3 refills | Status: DC

## 2018-09-29 NOTE — Patient Instructions (Signed)
We have sent in a cream to use for the pain on the back and side.   Health Maintenance, Female Adopting a healthy lifestyle and getting preventive care can go a long way to promote health and wellness. Talk with your health care provider about what schedule of regular examinations is right for you. This is a good chance for you to check in with your provider about disease prevention and staying healthy. In between checkups, there are plenty of things you can do on your own. Experts have done a lot of research about which lifestyle changes and preventive measures are most likely to keep you healthy. Ask your health care provider for more information. Weight and diet Eat a healthy diet  Be sure to include plenty of vegetables, fruits, low-fat dairy products, and lean protein.  Do not eat a lot of foods high in solid fats, added sugars, or salt.  Get regular exercise. This is one of the most important things you can do for your health. ? Most adults should exercise for at least 150 minutes each week. The exercise should increase your heart rate and make you sweat (moderate-intensity exercise). ? Most adults should also do strengthening exercises at least twice a week. This is in addition to the moderate-intensity exercise. Maintain a healthy weight  Body mass index (BMI) is a measurement that can be used to identify possible weight problems. It estimates body fat based on height and weight. Your health care provider can help determine your BMI and help you achieve or maintain a healthy weight.  For females 24 years of age and older: ? A BMI below 18.5 is considered underweight. ? A BMI of 18.5 to 24.9 is normal. ? A BMI of 25 to 29.9 is considered overweight. ? A BMI of 30 and above is considered obese. Watch levels of cholesterol and blood lipids  You should start having your blood tested for lipids and cholesterol at 82 years of age, then have this test every 5 years.  You may need to have  your cholesterol levels checked more often if: ? Your lipid or cholesterol levels are high. ? You are older than 82 years of age. ? You are at high risk for heart disease. Cancer screening Lung Cancer  Lung cancer screening is recommended for adults 36-22 years old who are at high risk for lung cancer because of a history of smoking.  A yearly low-dose CT scan of the lungs is recommended for people who: ? Currently smoke. ? Have quit within the past 15 years. ? Have at least a 30-pack-year history of smoking. A pack year is smoking an average of one pack of cigarettes a day for 1 year.  Yearly screening should continue until it has been 15 years since you quit.  Yearly screening should stop if you develop a health problem that would prevent you from having lung cancer treatment. Breast Cancer  Practice breast self-awareness. This means understanding how your breasts normally appear and feel.  It also means doing regular breast self-exams. Let your health care provider know about any changes, no matter how small.  If you are in your 20s or 30s, you should have a clinical breast exam (CBE) by a health care provider every 1-3 years as part of a regular health exam.  If you are 79 or older, have a CBE every year. Also consider having a breast X-ray (mammogram) every year.  If you have a family history of breast cancer, talk to  your health care provider about genetic screening.  If you are at high risk for breast cancer, talk to your health care provider about having an MRI and a mammogram every year.  Breast cancer gene (BRCA) assessment is recommended for women who have family members with BRCA-related cancers. BRCA-related cancers include: ? Breast. ? Ovarian. ? Tubal. ? Peritoneal cancers.  Results of the assessment will determine the need for genetic counseling and BRCA1 and BRCA2 testing. Cervical Cancer Your health care provider may recommend that you be screened regularly  for cancer of the pelvic organs (ovaries, uterus, and vagina). This screening involves a pelvic examination, including checking for microscopic changes to the surface of your cervix (Pap test). You may be encouraged to have this screening done every 3 years, beginning at age 61.  For women ages 56-65, health care providers may recommend pelvic exams and Pap testing every 3 years, or they may recommend the Pap and pelvic exam, combined with testing for human papilloma virus (HPV), every 5 years. Some types of HPV increase your risk of cervical cancer. Testing for HPV may also be done on women of any age with unclear Pap test results.  Other health care providers may not recommend any screening for nonpregnant women who are considered low risk for pelvic cancer and who do not have symptoms. Ask your health care provider if a screening pelvic exam is right for you.  If you have had past treatment for cervical cancer or a condition that could lead to cancer, you need Pap tests and screening for cancer for at least 20 years after your treatment. If Pap tests have been discontinued, your risk factors (such as having a new sexual partner) need to be reassessed to determine if screening should resume. Some women have medical problems that increase the chance of getting cervical cancer. In these cases, your health care provider may recommend more frequent screening and Pap tests. Colorectal Cancer  This type of cancer can be detected and often prevented.  Routine colorectal cancer screening usually begins at 82 years of age and continues through 82 years of age.  Your health care provider may recommend screening at an earlier age if you have risk factors for colon cancer.  Your health care provider may also recommend using home test kits to check for hidden blood in the stool.  A small camera at the end of a tube can be used to examine your colon directly (sigmoidoscopy or colonoscopy). This is done to check  for the earliest forms of colorectal cancer.  Routine screening usually begins at age 34.  Direct examination of the colon should be repeated every 5-10 years through 82 years of age. However, you may need to be screened more often if early forms of precancerous polyps or small growths are found. Skin Cancer  Check your skin from head to toe regularly.  Tell your health care provider about any new moles or changes in moles, especially if there is a change in a mole's shape or color.  Also tell your health care provider if you have a mole that is larger than the size of a pencil eraser.  Always use sunscreen. Apply sunscreen liberally and repeatedly throughout the day.  Protect yourself by wearing long sleeves, pants, a wide-brimmed hat, and sunglasses whenever you are outside. Heart disease, diabetes, and high blood pressure  High blood pressure causes heart disease and increases the risk of stroke. High blood pressure is more likely to develop in: ?  People who have blood pressure in the high end of the normal range (130-139/85-89 mm Hg). ? People who are overweight or obese. ? People who are African American.  If you are 8-30 years of age, have your blood pressure checked every 3-5 years. If you are 93 years of age or older, have your blood pressure checked every year. You should have your blood pressure measured twice-once when you are at a hospital or clinic, and once when you are not at a hospital or clinic. Record the average of the two measurements. To check your blood pressure when you are not at a hospital or clinic, you can use: ? An automated blood pressure machine at a pharmacy. ? A home blood pressure monitor.  If you are between 24 years and 21 years old, ask your health care provider if you should take aspirin to prevent strokes.  Have regular diabetes screenings. This involves taking a blood sample to check your fasting blood sugar level. ? If you are at a normal weight  and have a low risk for diabetes, have this test once every three years after 82 years of age. ? If you are overweight and have a high risk for diabetes, consider being tested at a younger age or more often. Preventing infection Hepatitis B  If you have a higher risk for hepatitis B, you should be screened for this virus. You are considered at high risk for hepatitis B if: ? You were born in a country where hepatitis B is common. Ask your health care provider which countries are considered high risk. ? Your parents were born in a high-risk country, and you have not been immunized against hepatitis B (hepatitis B vaccine). ? You have HIV or AIDS. ? You use needles to inject street drugs. ? You live with someone who has hepatitis B. ? You have had sex with someone who has hepatitis B. ? You get hemodialysis treatment. ? You take certain medicines for conditions, including cancer, organ transplantation, and autoimmune conditions. Hepatitis C  Blood testing is recommended for: ? Everyone born from [birth year] through 1963. ? Anyone with known risk factors for hepatitis C. Sexually transmitted infections (STIs)  You should be screened for sexually transmitted infections (STIs) including gonorrhea and chlamydia if: ? You are sexually active and are younger than 82 years of age. ? You are older than 82 years of age and your health care provider tells you that you are at risk for this type of infection. ? Your sexual activity has changed since you were last screened and you are at an increased risk for chlamydia or gonorrhea. Ask your health care provider if you are at risk.  If you do not have HIV, but are at risk, it may be recommended that you take a prescription medicine daily to prevent HIV infection. This is called pre-exposure prophylaxis (PrEP). You are considered at risk if: ? You are sexually active and do not regularly use condoms or know the HIV status of your partner(s). ? You take drugs by  injection. ? You are sexually active with a partner who has HIV. Talk with your health care provider about whether you are at high risk of being infected with HIV. If you choose to begin PrEP, you should first be tested for HIV. You should then be tested every 3 months for as long as you are taking PrEP. Pregnancy  If you are premenopausal and you may become pregnant, ask your health care provider about  preconception counseling.  If you may become pregnant, take 400 to 800 micrograms (mcg) of folic acid every day.  If you want to prevent pregnancy, talk to your health care provider about birth control (contraception). Osteoporosis and menopause  Osteoporosis is a disease in which the bones lose minerals and strength with aging. This can result in serious bone fractures. Your risk for osteoporosis can be identified using a bone density scan.  If you are 63 years of age or older, or if you are at risk for osteoporosis and fractures, ask your health care provider if you should be screened.  Ask your health care provider whether you should take a calcium or vitamin D supplement to lower your risk for osteoporosis.  Menopause may have certain physical symptoms and risks.  Hormone replacement therapy may reduce some of these symptoms and risks. Talk to your health care provider about whether hormone replacement therapy is right for you. Follow these instructions at home:  Schedule regular health, dental, and eye exams.  Stay current with your immunizations.  Do not use any tobacco products including cigarettes, chewing tobacco, or electronic cigarettes.  If you are pregnant, do not drink alcohol.  If you are breastfeeding, limit how much and how often you drink alcohol.  Limit alcohol intake to no more than 1 drink per day for nonpregnant women. One drink equals 12 ounces of beer, 5 ounces of wine, or 1 ounces of hard liquor.  Do not use street drugs.  Do not share needles.  Ask  your health care provider for help if you need support or information about quitting drugs.  Tell your health care provider if you often feel depressed.  Tell your health care provider if you have ever been abused or do not feel safe at home. This information is not intended to replace advice given to you by your health care provider. Make sure you discuss any questions you have with your health care provider. Document Released: 10/12/2010 Document Revised: 09/04/2015 Document Reviewed: 12/31/2014 Elsevier Interactive Patient Education  2019 [employer].

## 2018-09-29 NOTE — Assessment & Plan Note (Signed)
Doing well with CPAP and sees the benefit. Wearing at least 6 days per week and 6 hours per night.

## 2018-09-29 NOTE — Assessment & Plan Note (Signed)
Checking HgA1c, reminded about eye exam. Taking actos and amaryl and metformin and adjust as needed.

## 2018-09-29 NOTE — Assessment & Plan Note (Signed)
Flu shot yearly counseled. Pneumonia complete. Shingrix counseled. Tetanus due 2023. Colonoscopy aged out. Mammogram aged out, pap smear aged out and dexa due 2022. Counseled about sun safety and mole surveillance. Counseled about the dangers of distracted driving. Given 10 year screening recommendations.

## 2018-09-29 NOTE — Progress Notes (Signed)
   Subjective:   Patient ID: Whitney [name], female    DOB: 1934/08/03, 84 y.o.   MRN: [id number]  HPI The patient is an 84 YO female coming in for physical.   PMH, [place], social history reviewed and updated  Review of Systems  Constitutional: Negative.   HENT: Negative.   Eyes: Negative.   Respiratory: Negative for cough, chest tightness and shortness of breath.   Cardiovascular: Negative for chest pain, palpitations and leg swelling.  Gastrointestinal: Negative for abdominal distention, abdominal pain, constipation, diarrhea, nausea and vomiting.  Musculoskeletal: Negative.   Skin: Negative.   Neurological: Negative.   Psychiatric/Behavioral: Negative.     Objective:  Physical Exam Constitutional:      Appearance: She is well-developed. She is obese.  HENT:     Head: Normocephalic and atraumatic.  Neck:     Musculoskeletal: Normal range of motion.  Cardiovascular:     Rate and Rhythm: Normal rate and regular rhythm.  Pulmonary:     Effort: Pulmonary effort is normal. No respiratory distress.     Breath sounds: Normal breath sounds. No wheezing or rales.  Abdominal:     General: Bowel sounds are normal. There is no distension.     Palpations: Abdomen is soft.     Tenderness: There is no abdominal tenderness. There is no rebound.  Skin:    General: Skin is warm and dry.  Neurological:     Mental Status: She is alert and oriented to person, place, and time.     Coordination: Coordination normal.     Vitals:   09/29/18 0807  BP: 130/90  Pulse: 66  Temp: 97.8 F (36.6 C)  TempSrc: Oral  SpO2: 99%  Weight: 168 lb (76.2 kg)  Height: 5\' 1"  (1.549 m)    Assessment & Plan:

## 2018-09-29 NOTE — Assessment & Plan Note (Signed)
Checking lipid panel and adjust simvastatin 20 mg daily as needed.  

## 2018-10-02 ENCOUNTER — Telehealth: Payer: Self-pay | Admitting: Internal Medicine

## 2018-10-02 NOTE — Telephone Encounter (Signed)
Pharmacy tech requesting a call back to fix the dosage and quantity for diclofenac sodium (VOLTAREN) 1 % GEL .

## 2018-10-02 NOTE — Telephone Encounter (Signed)
Called back and gave it as prn order for one effected area and 90 day supply with 3 refills

## 2018-10-04 ENCOUNTER — Telehealth: Payer: Self-pay | Admitting: *Deleted

## 2018-10-04 NOTE — Telephone Encounter (Signed)
Attempted to call pt and make VV due to Due to current COVID 19 pandemic, our office is severely reducing in office visits until further notice, in order to minimize the risk to our patients and healthcare providers.  Could not LM.

## 2018-10-05 ENCOUNTER — Other Ambulatory Visit: Payer: Self-pay

## 2018-10-05 ENCOUNTER — Ambulatory Visit (INDEPENDENT_AMBULATORY_CARE_PROVIDER_SITE_OTHER): Payer: HMO | Admitting: Adult Health

## 2018-10-05 ENCOUNTER — Encounter: Payer: Self-pay | Admitting: Adult Health

## 2018-10-05 VITALS — BP 132/70 | HR 60 | Temp 97.5°F | Ht 61.0 in | Wt 166.0 lb

## 2018-10-05 DIAGNOSIS — Z9989 Dependence on other enabling machines and devices: Secondary | ICD-10-CM | POA: Diagnosis not present

## 2018-10-05 DIAGNOSIS — G4733 Obstructive sleep apnea (adult) (pediatric): Secondary | ICD-10-CM

## 2018-10-05 NOTE — Progress Notes (Signed)
[facility] received cpap orders in epic CS. sy

## 2018-10-05 NOTE — Patient Instructions (Signed)
Your Plan:  Continue using CPAP nightly If your symptoms worsen or you develop new symptoms please let us know.   Please call [facility] at [phone], and press option [phone] when prompted. Their customer service representatives will be glad to assist you. If they are unable to answer, please leave a message and they will call you back. Make sure to leave your name and return phone number.  Thank you for coming to see [place] at [facility]. I hope we have been able to provide you high quality care today.  You may receive a patient satisfaction survey over the next few weeks. We would appreciate your feedback and comments so that we may continue to improve ourselves and the health of our patients.

## 2018-10-05 NOTE — Progress Notes (Addendum)
  PATIENT: Whitney [name] DOB: 07/20/1934  REASON FOR VISIT: follow up HISTORY FROM: patient  HISTORY OF PRESENT ILLNESS: Today 10/05/18:  Ms. Hyun is an 84-year-old female with a history of obstructive sleep apnea on CPAP.  Her download indicates that she used her machine 23 out of 30 days for compliance of 77%.  She use her machine greater than 4 hours 22 out of 30 days for compliance of 73%.  On average she uses her machine 7 hours and 54 minutes.  Her residual AHI is 7.1 on 7 to 14 cm of water with EPR of 3.  She states that she has never changed out her supplies.  She does not feel the mask leaking at night.  Although she does report that sometimes she gets a red face on her CPAP machine.  She returns today for follow-up.   REVIEW OF SYSTEMS: Out of a complete 14 system review of symptoms, the patient complains only of the following symptoms, and all other reviewed systems are negative.  See HPI  ALLERGIES: No Known Allergies  HOME MEDICATIONS: Outpatient Medications Prior to Visit  Medication Sig Dispense Refill  . aspirin 81 MG tablet Take 1 tablet (81 mg total) by mouth daily. 90 tablet 1  . blood glucose meter kit and supplies KIT Use to test blood sugar up to 3 times daily. DX E11.9 1 each 0  . Calcium Carb-Cholecalciferol (CALCIUM-VITAMIN D) 500-200 MG-UNIT tablet Take 1 tablet by mouth 2 (two) times daily with a meal. 180 tablet 1  . dorzolamide-timolol (COSOPT) 22.3-6.8 MG/ML ophthalmic solution Place 1 drop into both eyes 2 (two) times daily. 10 mL 0  . DUREZOL 0.05 % EMUL     . glimepiride (AMARYL) 4 MG tablet TAKE 1 TABLET EVERY DAY BEFORE BREAKFAST 90 tablet 1  . glucose blood (ONETOUCH VERIO) test strip Used to check blood sugars three time daily 100 each 1  . ketorolac (ACULAR) 0.5 % ophthalmic solution     . Lancet Devices (ACCU-CHEK SOFTCLIX) lancets Use to test blood sugar up to 3 times a day. DX E11.9 1 each 0  . lisinopril-hydrochlorothiazide  (PRINZIDE,ZESTORETIC) 20-12.5 MG tablet Take 1 tablet by mouth daily. 90 tablet 1  . metFORMIN (GLUCOPHAGE) 1000 MG tablet Take 1 tablet (1,000 mg total) by mouth 2 (two) times daily with a meal. 180 tablet 1  . Multiple Vitamin (MULTI VITAMIN DAILY PO) Take one by mouth daily    . pioglitazone (ACTOS) 30 MG tablet Take 1 tablet (30 mg total) by mouth daily. 90 tablet 1  . simvastatin (ZOCOR) 20 MG tablet Take 1 tablet (20 mg total) by mouth at bedtime. 90 tablet 1  . diclofenac sodium (VOLTAREN) 1 % GEL Apply 4 g topically 4 (four) times daily. (Patient not taking: Reported on 10/05/2018) 100 g 3  . ketorolac (ACULAR) 0.4 % SOLN INSTILL 1 DROP INTO EACH EYE QID  0   No facility-administered medications prior to visit.     PAST MEDICAL HISTORY: Past Medical History:  Diagnosis Date  . Anxiety   . Baker's cyst   . Chest pain   . Diabetes mellitus type 2, uncontrolled (HCC)   . Diabetes mellitus without complication (HCC)   . Disc disease, degenerative, cervical   . Diverticulosis of colon   . DJD (degenerative joint disease) of knee   . Facial pain, atypical   . GERD (gastroesophageal reflux disease)   . Glaucoma   . Hypercholesteremia   . Hypertension   .   Lumbar spondylosis   . Obesity   . Onychomycosis   . Osteopenia     PAST SURGICAL HISTORY: Past Surgical History:  Procedure Laterality Date  . right eye surgery  09/2010   retina in right eye  . VAGINAL HYSTERECTOMY  2002   Dr. Henley--AP repair    FAMILY HISTORY: Family History  Problem Relation Age of Onset  . Diabetes Mother   . Colon cancer Neg Hx   . Stomach cancer Neg Hx     SOCIAL HISTORY: Social History   Socioeconomic History  . Marital status: Widowed    Spouse name: deceased 09/2008  . Number of children: Not on file  . Years of education: Not on file  . Highest education level: Not on file  Occupational History  . Not on file  Social Needs  . Financial resource strain: Not very hard  . Food  insecurity    Worry: Never true    Inability: Never true  . Transportation needs    Medical: No    Non-medical: No  Tobacco Use  . Smoking status: Never Smoker  . Smokeless tobacco: Never Used  Substance and Sexual Activity  . Alcohol use: No  . Drug use: No  . Sexual activity: Never  Lifestyle  . Physical activity    Days per week: 3 days    Minutes per session: 40 min  . Stress: Only a little  Relationships  . Social connections    Talks on phone: More than three times a week    Gets together: More than three times a week    Attends religious service: More than 4 times per year    Active member of club or organization: Yes    Attends meetings of clubs or organizations: More than 4 times per year    Relationship status: Widowed  . Intimate partner violence    Fear of current or ex partner: Not on file    Emotionally abused: Not on file    Physically abused: Not on file    Forced sexual activity: Not on file  Other Topics Concern  . Not on file  Social History Narrative   Lives alone   Spouse passed away 2010   Son;    3 girls   Peggy;       PHYSICAL EXAM  Vitals:   10/05/18 0824  BP: 132/70  Pulse: 60  Temp: (!) 97.5 F (36.4 C)  Weight: 166 lb (75.3 kg)  Height: 5' 1" (1.549 m)   Body mass index is 31.37 kg/m.  Generalized: Well developed, in no acute distress  Chest: Lungs clear to auscultation  Neurological examination  Mentation: Alert oriented to time, place, history taking. Follows all commands speech and language fluent Cranial nerve II-XII: Pupils were equal round reactive to light. Extraocular movements were full, visual field were full on confrontational test. Facial sensation and strength were normal. Uvula tongue midline. Head turning and shoulder shrug  were normal and symmetric. Motor: The motor testing reveals 5 over 5 strength of all 4 extremities. Good symmetric motor tone is noted throughout.  Sensory: Sensory testing is intact to  soft touch on all 4 extremities. No evidence of extinction is noted.  Coordination: Cerebellar testing reveals good finger-nose-finger and heel-to-shin bilaterally.  Gait and station: Gait is normal.  DIAGNOSTIC DATA (LABS, IMAGING, TESTING) - I reviewed patient records, labs, notes, testing and imaging myself where available.  Lab Results  Component Value Date   WBC 6.5 09/29/2018     HGB 12.4 09/29/2018   HCT 37.4 09/29/2018   MCV 81.6 09/29/2018   PLT 193.0 09/29/2018      Component Value Date/Time   NA 142 09/29/2018 0845   K 4.1 09/29/2018 0845   CL 105 09/29/2018 0845   CO2 29 09/29/2018 0845   GLUCOSE 149 (H) 09/29/2018 0845   BUN 10 09/29/2018 0845   CREATININE 0.75 09/29/2018 0845   CALCIUM 10.1 09/29/2018 0845   PROT 7.4 09/29/2018 0845   ALBUMIN 3.9 09/29/2018 0845   AST 19 09/29/2018 0845   ALT 17 09/29/2018 0845   ALKPHOS 47 09/29/2018 0845   BILITOT 0.7 09/29/2018 0845   GFRNONAA 87.17 02/16/2010 1003   GFRAA 156 02/19/2008 1103   Lab Results  Component Value Date   CHOL 119 09/29/2018   HDL 38.10 (L) 09/29/2018   LDLCALC 55 09/29/2018   TRIG 131.0 09/29/2018   CHOLHDL 3 09/29/2018   Lab Results  Component Value Date   HGBA1C 7.4 (H) 09/29/2018   No results found for: VITAMINB12 Lab Results  Component Value Date   TSH 1.13 09/14/2013      ASSESSMENT AND PLAN 84 y.o. year old female  has a past medical history of Anxiety, Baker's cyst, Chest pain, Diabetes mellitus type 2, uncontrolled ([place]), Diabetes mellitus without complication ([place]), Disc disease, degenerative, cervical, Diverticulosis of colon, DJD (degenerative joint disease) of knee, Facial pain, atypical, GERD (gastroesophageal reflux disease), Glaucoma, Hypercholesteremia, Hypertension, Lumbar spondylosis, Obesity, Onychomycosis, and Osteopenia. here with:  1.  Obstructive sleep apnea on CPAP  The patient CPAP download shows good compliance but her AHI is slightly elevated.  I will  increase her pressure to 7cm to 16 cm of water.  I have also advised that she should change out her supplies.  She is encouraged to use her CPAP machine nightly and greater than 4 hours each night.  She is advised that if her symptoms worsen or she develops new symptoms she should let us know.   I spent 15 minutes with the patient this time was spent reviewing her CPAP download   Ward Givens, MSN, NP-C 10/05/2018, 8:33 AM [facility] [address], [place], [place] [address] [phone]  I reviewed the above note and documentation by the Nurse Practitioner and agree with the history, physical exam, assessment and plan as outlined above. I was immediately available for consultation. Star Age, MD, PhD [facility])

## 2018-10-09 ENCOUNTER — Other Ambulatory Visit: Payer: Self-pay | Admitting: Adult Health

## 2018-10-09 DIAGNOSIS — G4733 Obstructive sleep apnea (adult) (pediatric): Secondary | ICD-10-CM | POA: Diagnosis not present

## 2018-10-09 DIAGNOSIS — Z9989 Dependence on other enabling machines and devices: Secondary | ICD-10-CM

## 2018-10-16 DIAGNOSIS — G4733 Obstructive sleep apnea (adult) (pediatric): Secondary | ICD-10-CM | POA: Diagnosis not present

## 2018-10-18 DIAGNOSIS — H35071 Retinal telangiectasis, right eye: Secondary | ICD-10-CM | POA: Diagnosis not present

## 2018-10-18 DIAGNOSIS — E113312 Type 2 diabetes mellitus with moderate nonproliferative diabetic retinopathy with macular edema, left eye: Secondary | ICD-10-CM | POA: Diagnosis not present

## 2018-10-18 DIAGNOSIS — E113311 Type 2 diabetes mellitus with moderate nonproliferative diabetic retinopathy with macular edema, right eye: Secondary | ICD-10-CM | POA: Diagnosis not present

## 2018-10-18 DIAGNOSIS — H35072 Retinal telangiectasis, left eye: Secondary | ICD-10-CM | POA: Diagnosis not present

## 2018-10-18 LAB — HM DIABETES EYE EXAM

## 2018-10-20 ENCOUNTER — Encounter: Payer: Self-pay | Admitting: Internal Medicine

## 2018-10-20 NOTE — Progress Notes (Signed)
Abstracted and sent to scan  

## 2018-11-14 DIAGNOSIS — H35041 Retinal micro-aneurysms, unspecified, right eye: Secondary | ICD-10-CM | POA: Diagnosis not present

## 2018-11-14 DIAGNOSIS — H35071 Retinal telangiectasis, right eye: Secondary | ICD-10-CM | POA: Diagnosis not present

## 2018-11-14 DIAGNOSIS — E113311 Type 2 diabetes mellitus with moderate nonproliferative diabetic retinopathy with macular edema, right eye: Secondary | ICD-10-CM | POA: Diagnosis not present

## 2018-11-16 DIAGNOSIS — G4733 Obstructive sleep apnea (adult) (pediatric): Secondary | ICD-10-CM | POA: Diagnosis not present

## 2018-11-30 DIAGNOSIS — H401131 Primary open-angle glaucoma, bilateral, mild stage: Secondary | ICD-10-CM | POA: Diagnosis not present

## 2018-11-30 DIAGNOSIS — E113313 Type 2 diabetes mellitus with moderate nonproliferative diabetic retinopathy with macular edema, bilateral: Secondary | ICD-10-CM | POA: Diagnosis not present

## 2018-11-30 DIAGNOSIS — H02423 Myogenic ptosis of bilateral eyelids: Secondary | ICD-10-CM | POA: Diagnosis not present

## 2018-11-30 DIAGNOSIS — Z961 Presence of intraocular lens: Secondary | ICD-10-CM | POA: Diagnosis not present

## 2018-11-30 LAB — HM DIABETES EYE EXAM

## 2018-12-06 DIAGNOSIS — H35072 Retinal telangiectasis, left eye: Secondary | ICD-10-CM | POA: Diagnosis not present

## 2018-12-06 DIAGNOSIS — H35042 Retinal micro-aneurysms, unspecified, left eye: Secondary | ICD-10-CM | POA: Diagnosis not present

## 2018-12-06 DIAGNOSIS — E113312 Type 2 diabetes mellitus with moderate nonproliferative diabetic retinopathy with macular edema, left eye: Secondary | ICD-10-CM | POA: Diagnosis not present

## 2018-12-17 DIAGNOSIS — G4733 Obstructive sleep apnea (adult) (pediatric): Secondary | ICD-10-CM | POA: Diagnosis not present

## 2019-01-02 ENCOUNTER — Other Ambulatory Visit: Payer: Self-pay | Admitting: Internal Medicine

## 2019-01-02 DIAGNOSIS — Z1231 Encounter for screening mammogram for malignant neoplasm of breast: Secondary | ICD-10-CM

## 2019-01-15 ENCOUNTER — Ambulatory Visit: Payer: Self-pay

## 2019-01-16 DIAGNOSIS — E11331 Type 2 diabetes mellitus with moderate nonproliferative diabetic retinopathy with macular edema: Secondary | ICD-10-CM | POA: Diagnosis not present

## 2019-01-16 DIAGNOSIS — H43811 Vitreous degeneration, right eye: Secondary | ICD-10-CM | POA: Diagnosis not present

## 2019-01-16 DIAGNOSIS — H35041 Retinal micro-aneurysms, unspecified, right eye: Secondary | ICD-10-CM | POA: Diagnosis not present

## 2019-01-16 DIAGNOSIS — H35071 Retinal telangiectasis, right eye: Secondary | ICD-10-CM | POA: Diagnosis not present

## 2019-01-19 DIAGNOSIS — G4733 Obstructive sleep apnea (adult) (pediatric): Secondary | ICD-10-CM | POA: Diagnosis not present

## 2019-01-22 ENCOUNTER — Telehealth: Payer: Self-pay | Admitting: Internal Medicine

## 2019-01-22 MED ORDER — GLIMEPIRIDE 4 MG PO TABS: ORAL_TABLET | ORAL | 1 refills | Status: DC

## 2019-01-22 MED ORDER — METFORMIN HCL 1000 MG PO TABS: 1000.0000 mg | ORAL_TABLET | Freq: Two times a day (BID) | ORAL | 1 refills | Status: DC

## 2019-01-22 MED ORDER — LISINOPRIL-HYDROCHLOROTHIAZIDE 20-12.5 MG PO TABS: 1.0000 | ORAL_TABLET | Freq: Every day | ORAL | 1 refills | Status: DC

## 2019-01-22 MED ORDER — PIOGLITAZONE HCL 30 MG PO TABS: 30.0000 mg | ORAL_TABLET | Freq: Every day | ORAL | 1 refills | Status: DC

## 2019-01-22 MED ORDER — SIMVASTATIN 20 MG PO TABS: 20.0000 mg | ORAL_TABLET | Freq: Every day | ORAL | 1 refills | Status: DC

## 2019-01-22 NOTE — Telephone Encounter (Signed)
Pt is requesting refill for medications below. Pt is requesting a 90 days supply.    lisinopril-hydrochlorothiazide (PRINZIDE,ZESTORETIC) 20-12.5 MG tablet   metFORMIN (GLUCOPHAGE) 1000 MG tablet   pioglitazone (ACTOS) 30 MG tablet   simvastatin (ZOCOR) 20 MG tablet  glimepiride (AMARYL) 4 MG tablet    Pharmacy: [facility] - [place], [place]

## 2019-01-25 ENCOUNTER — Encounter

## 2019-01-26 ENCOUNTER — Encounter: Payer: Self-pay | Admitting: Podiatry

## 2019-01-26 ENCOUNTER — Ambulatory Visit: Payer: HMO | Admitting: Podiatry

## 2019-01-26 ENCOUNTER — Other Ambulatory Visit: Payer: Self-pay

## 2019-01-26 DIAGNOSIS — E119 Type 2 diabetes mellitus without complications: Secondary | ICD-10-CM

## 2019-01-26 NOTE — Progress Notes (Signed)
This patient presents to the office saying that she is experiencing numbness in her toes and swelling in her right ankle.  This patient is diabetic and is taking medicine by mouth.  She says her medical doctor has given her topical medicine to apply to her toes which she has not done.  She also says that she has occasional swelling in her right ankle without any pain or discomfort.  She has been diagnosed previously with venous insufficiency.  She presents the office today for treatment of these 2 problems as well as a diabetic foot exam.  Vascular  Dorsalis pedis and posterior tibial pulses are palpable  B/L.  Capillary return  WNL.  Temperature gradient is  WNL.  Skin turgor  WNL  Sensorium  Senn Weinstein monofilament wire  WNL. Normal tactile sensation.  Nail Exam  Patient has normal nails with no evidence of bacterial or fungal infection.  Orthopedic  Exam  Muscle tone and muscle strength  WNL.  No limitations of motion feet  B/L.  No crepitus or joint effusion noted.  Hammer toe 2-5  B/L.    Bony prominences are unremarkable. Adductovarus 5th digit  B/L.  Skin  No open lesions.  Normal skin texture and turgor.  Diabetic neuropathy.  Swelling right ankle.  Diabetic foot exam was performed.  Compression sock dispensed for her right ankle swelling.  Told her to use the topical medicine prescribed by her PCP.  Her vascular status is intact and LOPS  WNL.    RTC prn.   Gardiner Barefoot DPM

## 2019-01-31 DIAGNOSIS — E113311 Type 2 diabetes mellitus with moderate nonproliferative diabetic retinopathy with macular edema, right eye: Secondary | ICD-10-CM | POA: Diagnosis not present

## 2019-01-31 DIAGNOSIS — H35072 Retinal telangiectasis, left eye: Secondary | ICD-10-CM | POA: Diagnosis not present

## 2019-01-31 DIAGNOSIS — E113312 Type 2 diabetes mellitus with moderate nonproliferative diabetic retinopathy with macular edema, left eye: Secondary | ICD-10-CM | POA: Diagnosis not present

## 2019-02-20 ENCOUNTER — Ambulatory Visit
Admission: RE | Admit: 2019-02-20 | Discharge: 2019-02-20 | Disposition: A | Discharge status: Home / Self Care | Payer: HMO | Source: Ambulatory Visit | Attending: Internal Medicine | Admitting: Internal Medicine

## 2019-02-20 ENCOUNTER — Other Ambulatory Visit: Payer: Self-pay

## 2019-02-20 DIAGNOSIS — Z1231 Encounter for screening mammogram for malignant neoplasm of breast: Secondary | ICD-10-CM

## 2019-03-12 ENCOUNTER — Ambulatory Visit: Payer: Self-pay

## 2019-03-16 ENCOUNTER — Other Ambulatory Visit: Payer: Self-pay

## 2019-03-16 NOTE — Patient Outreach (Signed)
  [facility]) Care Management Chronic Special Needs Program  03/16/2019  Name: Whitney [name] DOB: 27-Aug-1934  MRN: [id number]  Ms. [name] is enrolled in a chronic special needs plan for Diabetes. Reviewed and updated care plan.  Subjective: client reports she is doing well. She states she attends her scheduled appointments and denies any difficulty with transportation. Last A1C 7.4 on 09/29/2018. She states she feels her blood sugar control is "better" and states she continues to work on it. Blood sugar this morning was 135 this morning. She denies any questions or problems at this time.  Goals Addressed            This Visit's Progress   . <enter goal here> (pt-stated)   On track    "get my A1C down" by continuing to exercise and watching carb intake.     . COMPLETED: Client understands the importance of follow-up with providers by attending scheduled visits       Voiced importance of attending provider visits.    . Client will verbalize knowledge of self management of Hypertension as evidences by BP reading of 140/90 or less; or as defined by provider   On track   . COMPLETED: Maintain timely refills of diabetic medication as prescribed within the year .       Denies any difficulty obtaining medications.    . COMPLETED: Obtain annual  Lipid Profile, LDL-C       Done 09/29/2018    . COMPLETED: Obtain Annual Eye (retinal)  Exam        Done 11/30/2018    . COMPLETED: Obtain Annual Foot Exam       Per chart Done 01/26/2019    . Obtain annual screen for micro albuminuria (urine) , nephropathy (kidney problems)   On track   . Obtain Hemoglobin A1C at least 2 times per year   On track   . COMPLETED: Patient Stated       Get my blood sugar under control. Start to count carbohydrates and sugar. Go to free diabetic support classes offered by [facility].   Went to one class.    . Patient Stated   On track    Work toward lowering my HGB. A1c by monitoring  my diet closely and perhaps going to [facility] education classes.    . Visit Primary Care Provider or Endocrinologist at least 2 times per year    On track     Covid-19 precautions discussed. RNCM encouraged client to call 24 hour nurse advice line as needed. RNCM reinforced the availability of the healthcare concierge as needed for benefits questions. RNCM also encouraged client to call RNCM as needed.  Plan: RNCM will send updated care plan to client, send updated care plan to primary care. RNCM will follow up outreach within the next 6-9 months.    Thea Silversmith, RN, MSN, [place] [phone]   .

## 2019-03-20 DIAGNOSIS — H35041 Retinal micro-aneurysms, unspecified, right eye: Secondary | ICD-10-CM | POA: Diagnosis not present

## 2019-03-20 DIAGNOSIS — H35071 Retinal telangiectasis, right eye: Secondary | ICD-10-CM | POA: Diagnosis not present

## 2019-03-20 DIAGNOSIS — H43811 Vitreous degeneration, right eye: Secondary | ICD-10-CM | POA: Diagnosis not present

## 2019-03-20 DIAGNOSIS — E11311 Type 2 diabetes mellitus with unspecified diabetic retinopathy with macular edema: Secondary | ICD-10-CM | POA: Diagnosis not present

## 2019-03-30 ENCOUNTER — Encounter: Payer: Self-pay | Admitting: Internal Medicine

## 2019-03-30 ENCOUNTER — Ambulatory Visit (INDEPENDENT_AMBULATORY_CARE_PROVIDER_SITE_OTHER): Payer: HMO | Admitting: Internal Medicine

## 2019-03-30 ENCOUNTER — Other Ambulatory Visit: Payer: Self-pay

## 2019-03-30 VITALS — BP 122/72 | HR 80 | Temp 98.0°F | Ht 61.0 in | Wt 160.0 lb

## 2019-03-30 DIAGNOSIS — Z23 Encounter for immunization: Secondary | ICD-10-CM

## 2019-03-30 DIAGNOSIS — E1169 Type 2 diabetes mellitus with other specified complication: Secondary | ICD-10-CM | POA: Diagnosis not present

## 2019-03-30 DIAGNOSIS — E785 Hyperlipidemia, unspecified: Secondary | ICD-10-CM | POA: Diagnosis not present

## 2019-03-30 LAB — POCT GLYCOSYLATED HEMOGLOBIN (HGB A1C): Hemoglobin A1C: 7.2 % — AB (ref 4.0–5.6)

## 2019-03-30 MED ORDER — DICLOFENAC SODIUM 1 % EX GEL: 4.0000 g | Freq: Four times a day (QID) | CUTANEOUS | 6 refills | Status: DC

## 2019-03-30 NOTE — Patient Instructions (Addendum)
Your Hga1c is 7.2 today which is good.    Back Exercises These exercises help to make your trunk and back strong. They also help to keep the lower back flexible. Doing these exercises can help to prevent back pain or lessen existing pain.  If you have back pain, try to do these exercises 2-3 times each day or as told by your doctor.  As you get better, do the exercises once each day. Repeat the exercises more often as told by your doctor.  To stop back pain from coming back, do the exercises once each day, or as told by your doctor. Exercises Single knee to chest Do these steps 3-5 times in a row for each leg: 1. Lie on your back on a firm bed or the floor with your legs stretched out. 2. Bring one knee to your chest. 3. Grab your knee or thigh with both hands and hold them it in place. 4. Pull on your knee until you feel a gentle stretch in your lower back or buttocks. 5. Keep doing the stretch for 10-30 seconds. 6. Slowly let go of your leg and straighten it. Pelvic tilt Do these steps 5-10 times in a row: 1. Lie on your back on a firm bed or the floor with your legs stretched out. 2. Bend your knees so they point up to the ceiling. Your feet should be flat on the floor. 3. Tighten your lower belly (abdomen) muscles to press your lower back against the floor. This will make your tailbone point up to the ceiling instead of pointing down to your feet or the floor. 4. Stay in this position for 5-10 seconds while you gently tighten your muscles and breathe evenly. Cat-cow Do these steps until your lower back bends more easily: 1. Get on your hands and knees on a firm surface. Keep your hands under your shoulders, and keep your knees under your hips. You may put padding under your knees. 2. Let your head hang down toward your chest. Tighten (contract) the muscles in your belly. Point your tailbone toward the floor so your lower back becomes rounded like the back of a cat. 3. Stay in this  position for 5 seconds. 4. Slowly lift your head. Let the muscles of your belly relax. Point your tailbone up toward the ceiling so your back forms a sagging arch like the back of a cow. 5. Stay in this position for 5 seconds.  Press-ups Do these steps 5-10 times in a row: 1. Lie on your belly (face-down) on the floor. 2. Place your hands near your head, about shoulder-width apart. 3. While you keep your back relaxed and keep your hips on the floor, slowly straighten your arms to raise the top half of your body and lift your shoulders. Do not use your back muscles. You may change where you place your hands in order to make yourself more comfortable. 4. Stay in this position for 5 seconds. 5. Slowly return to lying flat on the floor.  Bridges Do these steps 10 times in a row: 1. Lie on your back on a firm surface. 2. Bend your knees so they point up to the ceiling. Your feet should be flat on the floor. Your arms should be flat at your sides, next to your body. 3. Tighten your butt muscles and lift your butt off the floor until your waist is almost as high as your knees. If you do not feel the muscles working in your butt  and the back of your thighs, slide your feet 1-2 inches farther away from your butt. 4. Stay in this position for 3-5 seconds. 5. Slowly lower your butt to the floor, and let your butt muscles relax. If this exercise is too easy, try doing it with your arms crossed over your chest. Belly crunches Do these steps 5-10 times in a row: 1. Lie on your back on a firm bed or the floor with your legs stretched out. 2. Bend your knees so they point up to the ceiling. Your feet should be flat on the floor. 3. Cross your arms over your chest. 4. Tip your chin a little bit toward your chest but do not bend your neck. 5. Tighten your belly muscles and slowly raise your chest just enough to lift your shoulder blades a tiny bit off of the floor. Avoid raising your body higher than that,  because it can put too much stress on your low back. 6. Slowly lower your chest and your head to the floor. Back lifts Do these steps 5-10 times in a row: 1. Lie on your belly (face-down) with your arms at your sides, and rest your forehead on the floor. 2. Tighten the muscles in your legs and your butt. 3. Slowly lift your chest off of the floor while you keep your hips on the floor. Keep the back of your head in line with the curve in your back. Look at the floor while you do this. 4. Stay in this position for 3-5 seconds. 5. Slowly lower your chest and your face to the floor. Contact a doctor if:  Your back pain gets a lot worse when you do an exercise.  Your back pain does not get better 2 hours after you exercise. If you have any of these problems, stop doing the exercises. Do not do them again unless your doctor says it is okay. Get help right away if:  You have sudden, very bad back pain. If this happens, stop doing the exercises. Do not do them again unless your doctor says it is okay. This information is not intended to replace advice given to you by your health care provider. Make sure you discuss any questions you have with your health care provider. Document Released: 05/01/2010 Document Revised: 12/22/2017 Document Reviewed: 12/22/2017 Elsevier Patient Education  2020 [employer].

## 2019-03-30 NOTE — Assessment & Plan Note (Signed)
HgA1c 7.2, foot exam done. Will continue metformin, actos and amaryl. No low sugars. Complicated by hyperlipidemia.

## 2019-03-30 NOTE — Progress Notes (Signed)
   Subjective:   Patient ID: Whitney [name], female    DOB: 24-Nov-1934, 84 y.o.   MRN: [id number]  HPI The patient is an 84 YO female coming in for follow up diabetes (taking metformin and amaryl and actos, denies low sugars, denies extreme thirst or urination, denies new or worsening numbness or tingling in feet)  Review of Systems  Constitutional: Negative.   HENT: Negative.   Eyes: Negative.   Respiratory: Negative for cough, chest tightness and shortness of breath.   Cardiovascular: Negative for chest pain, palpitations and leg swelling.  Gastrointestinal: Negative for abdominal distention, abdominal pain, constipation, diarrhea, nausea and vomiting.  Musculoskeletal: Positive for arthralgias and back pain.  Skin: Negative.   Neurological: Negative.   Psychiatric/Behavioral: Negative.     Objective:  Physical Exam Constitutional:      Appearance: She is well-developed.  HENT:     Head: Normocephalic and atraumatic.  Cardiovascular:     Rate and Rhythm: Normal rate and regular rhythm.  Pulmonary:     Effort: Pulmonary effort is normal. No respiratory distress.     Breath sounds: Normal breath sounds. No wheezing or rales.  Abdominal:     General: Bowel sounds are normal. There is no distension.     Palpations: Abdomen is soft.     Tenderness: There is no abdominal tenderness. There is no rebound.  Musculoskeletal:        General: Tenderness present.     Cervical back: Normal range of motion.  Skin:    General: Skin is warm and dry.  Neurological:     Mental Status: She is alert and oriented to person, place, and time.     Coordination: Coordination normal.     Vitals:   03/30/19 0837  BP: 122/72  Pulse: 80  Temp: 98 F (36.7 C)  TempSrc: Oral  SpO2: 99%  Weight: 160 lb (72.6 kg)  Height: 5\' 1"  (1.549 m)    This visit occurred during the SARS-CoV-2 public health emergency.  Safety protocols were in place, including screening questions prior to the visit,  additional usage of staff PPE, and extensive cleaning of exam room while observing appropriate contact time as indicated for disinfecting solutions.   Assessment & Plan:  Flu shot given at visit

## 2019-04-03 ENCOUNTER — Telehealth: Payer: Self-pay | Admitting: Internal Medicine

## 2019-04-03 NOTE — Telephone Encounter (Signed)
[facility] has been informed.

## 2019-04-03 NOTE — Telephone Encounter (Signed)
Pt's pharmacy is calling in to have clarification on pt's diclofenac Sodium (VOLTAREN) 1 % GEL, they received 2 Rx for this medication with different directions. Should pt be taking PRN or around the clock?      [facility] - [phone] Ref# [id number]

## 2019-04-03 NOTE — Telephone Encounter (Signed)
Okay to change to 300 g.

## 2019-04-03 NOTE — Telephone Encounter (Signed)
[facility] would like clarification if the pt should be using the medication PRN. In Epic is says "Apply 4 g topically 4 (four) times daily." Representative stated getting 300g instead of the 100g as prescribed would reduce the cost of the medication for the patient.She stating sending 100g would require them to send out 15 tubes of the medication to the patient vs 300g would only require then to send out a couple. Please advise.

## 2019-04-17 DIAGNOSIS — H35042 Retinal micro-aneurysms, unspecified, left eye: Secondary | ICD-10-CM | POA: Diagnosis not present

## 2019-04-17 DIAGNOSIS — E113312 Type 2 diabetes mellitus with moderate nonproliferative diabetic retinopathy with macular edema, left eye: Secondary | ICD-10-CM | POA: Diagnosis not present

## 2019-04-17 DIAGNOSIS — H35072 Retinal telangiectasis, left eye: Secondary | ICD-10-CM | POA: Diagnosis not present

## 2019-04-18 ENCOUNTER — Ambulatory Visit: Payer: HMO | Admitting: Adult Health

## 2019-04-18 ENCOUNTER — Other Ambulatory Visit: Payer: Self-pay | Admitting: Internal Medicine

## 2019-04-18 MED ORDER — DORZOLAMIDE HCL-TIMOLOL MAL 2-0.5 % OP SOLN: 1.0000 [drp] | Freq: Two times a day (BID) | OPHTHALMIC | 0 refills | Status: DC

## 2019-04-18 NOTE — Telephone Encounter (Signed)
Medication Refill - Medication: dorzolamide-timolol (COSOPT) 22.3-6.8 MG/ML ophthalmic solution  Has the patient contacted their pharmacy? Yes - it hasn't been called in with her others (Agent: If no, request that the patient contact the pharmacy for the refill.) (Agent: If yes, when and what did the pharmacy advise?)  Preferred Pharmacy (with phone number or street name):  [facility] - [place], [place] Phone:  [phone]  Fax:  [phone]     Agent: Please be advised that RX refills may take up to 3 business days. We ask that you follow-up with your pharmacy.

## 2019-04-24 ENCOUNTER — Ambulatory Visit: Payer: HMO | Attending: Internal Medicine

## 2019-04-24 DIAGNOSIS — Z20822 Contact with and (suspected) exposure to covid-19: Secondary | ICD-10-CM

## 2019-04-26 LAB — NOVEL CORONAVIRUS, NAA: SARS-CoV-2, NAA: NOT DETECTED

## 2019-05-07 ENCOUNTER — Ambulatory Visit: Payer: Medicare HMO

## 2019-05-15 ENCOUNTER — Ambulatory Visit: Payer: HMO | Admitting: Adult Health

## 2019-05-15 ENCOUNTER — Other Ambulatory Visit: Payer: Self-pay

## 2019-05-15 ENCOUNTER — Encounter: Payer: Self-pay | Admitting: Adult Health

## 2019-05-15 VITALS — BP 146/77 | HR 74 | Temp 95.5°F | Ht 61.0 in | Wt 169.4 lb

## 2019-05-15 DIAGNOSIS — G4733 Obstructive sleep apnea (adult) (pediatric): Secondary | ICD-10-CM

## 2019-05-15 DIAGNOSIS — Z9989 Dependence on other enabling machines and devices: Secondary | ICD-10-CM

## 2019-05-15 NOTE — Progress Notes (Signed)
PATIENT: Whitney [name] DOB: 08-Sep-1934  REASON FOR VISIT: follow up HISTORY FROM: patient  HISTORY OF PRESENT ILLNESS: Today 05/15/19:  Whitney [name] is an 85 year old female with a history of obstructive sleep apnea on CPAP.  She returns today for follow-up.  Her CPAP download indicates that she use her machine 28 out of 30 days for compliance of 93%.  She used her machine greater than 4 hours 27 days for compliance of 90%.  On average she uses her machine 8 hours and 43 minutes.  Her residual AHI is 5.4 on 7 to 16 cm of water with a EPR of 3.  Her leak in the 95th percentile is 32.4 L/min.  She states that she does not change out her supplies as she should because she does not know how.  She returns today for an evaluation.   HISTORY 10/05/18:  Whitney [name] is an 85 year old female with a history of obstructive sleep apnea on CPAP.  Her download indicates that she used her machine 23 out of 30 days for compliance of 77%.  She use her machine greater than 4 hours 22 out of 30 days for compliance of 73%.  On average she uses her machine 7 hours and 54 minutes.  Her residual AHI is 7.1 on 7 to 14 cm of water with EPR of 3.  She states that she has never changed out her supplies.  She does not feel the mask leaking at night.  Although she does report that sometimes she gets a red face on her CPAP machine.  She returns today for follow-up.  REVIEW OF SYSTEMS: Out of a complete 14 system review of symptoms, the patient complains only of the following symptoms, and all other reviewed systems are negative.  Epworth sleepiness score 8  ALLERGIES: No Known Allergies  HOME MEDICATIONS: Outpatient Medications Prior to Visit  Medication Sig Dispense Refill  . aspirin 81 MG tablet Take 1 tablet (81 mg total) by mouth daily. 90 tablet 1  . blood glucose meter kit and supplies KIT Use to test blood sugar up to 3 times daily. DX E11.9 1 each 0  . Calcium Carb-Cholecalciferol (CALCIUM-VITAMIN D)  500-200 MG-UNIT tablet Take 1 tablet by mouth 2 (two) times daily with a meal. 180 tablet 1  . diclofenac Sodium (VOLTAREN) 1 % GEL Apply 4 g topically 4 (four) times daily. 100 g 6  . dorzolamide-timolol (COSOPT) 22.3-6.8 MG/ML ophthalmic solution Place 1 drop into both eyes 2 (two) times daily. 10 mL 0  . DUREZOL 0.05 % EMUL     . glimepiride (AMARYL) 4 MG tablet TAKE 1 TABLET EVERY DAY BEFORE BREAKFAST 90 tablet 1  . glucose blood (ONETOUCH VERIO) test strip Used to check blood sugars three time daily 100 each 1  . Lancet Devices (ACCU-CHEK SOFTCLIX) lancets Use to test blood sugar up to 3 times a day. DX E11.9 1 each 0  . lisinopril-hydrochlorothiazide (ZESTORETIC) 20-12.5 MG tablet Take 1 tablet by mouth daily. 90 tablet 1  . metFORMIN (GLUCOPHAGE) 1000 MG tablet Take 1 tablet (1,000 mg total) by mouth 2 (two) times daily with a meal. 180 tablet 1  . Multiple Vitamin (MULTI VITAMIN DAILY PO) Take one by mouth daily    . pioglitazone (ACTOS) 30 MG tablet Take 1 tablet (30 mg total) by mouth daily. 90 tablet 1  . simvastatin (ZOCOR) 20 MG tablet Take 1 tablet (20 mg total) by mouth at bedtime. 90 tablet 1  . ketorolac (ACULAR)  0.5 % ophthalmic solution      No facility-administered medications prior to visit.    PAST MEDICAL HISTORY: Past Medical History:  Diagnosis Date  . Anxiety   . Baker's cyst   . Chest pain   . Diabetes mellitus type 2, uncontrolled ([place])   . Diabetes mellitus without complication ([place])   . Disc disease, degenerative, cervical   . Diverticulosis of colon   . DJD (degenerative joint disease) of knee   . Facial pain, atypical   . GERD (gastroesophageal reflux disease)   . Glaucoma   . Hypercholesteremia   . Hypertension   . Lumbar spondylosis   . Obesity   . Onychomycosis   . Osteopenia     PAST SURGICAL HISTORY: Past Surgical History:  Procedure Laterality Date  . right eye surgery  2010/10/07   retina in right eye  . VAGINAL HYSTERECTOMY  07/06/2000   Dr.  Kem Kays repair    FAMILY HISTORY: Family History  Problem Relation Age of Onset  . Diabetes Mother   . Colon cancer Neg Hx   . Stomach cancer Neg Hx     SOCIAL HISTORY: Social History   Socioeconomic History  . Marital status: Widowed    Spouse name: deceased 2008-10-06  . Number of children: Not on file  . Years of education: Not on file  . Highest education level: Not on file  Occupational History  . Not on file  Tobacco Use  . Smoking status: Never Smoker  . Smokeless tobacco: Never Used  Substance and Sexual Activity  . Alcohol use: No  . Drug use: No  . Sexual activity: Never  Other Topics Concern  . Not on file  Social History Narrative   Lives alone   Spouse passed away Jul 06, 2008   Son;    3 girls   Clinical cytogeneticist;    Social Determinants of Health   Financial Resource Strain:   . Difficulty of Paying Living Expenses: Not on file  Food Insecurity:   . Worried About Charity fundraiser in the Last Year: Not on file  . Ran Out of Food in the Last Year: Not on file  Transportation Needs:   . Lack of Transportation (Medical): Not on file  . Lack of Transportation (Non-Medical): Not on file  Physical Activity:   . Days of Exercise per Week: Not on file  . Minutes of Exercise per Session: Not on file  Stress:   . Feeling of Stress : Not on file  Social Connections:   . Frequency of Communication with Friends and Family: Not on file  . Frequency of Social Gatherings with Friends and Family: Not on file  . Attends Religious Services: Not on file  . Active Member of Clubs or Organizations: Not on file  . Attends Archivist Meetings: Not on file  . Marital Status: Not on file  Intimate Partner Violence:   . Fear of Current or Ex-Partner: Not on file  . Emotionally Abused: Not on file  . Physically Abused: Not on file  . Sexually Abused: Not on file      PHYSICAL EXAM  Vitals:   05/15/19 1251  BP: (!) 146/77  Pulse: 74  Temp: (!) 95.5 F (35.3 C)   TempSrc: Oral  Weight: 169 lb 6.4 oz (76.8 kg)  Height: '5\' 1"'  (1.549 m)   Body mass index is 32.01 kg/m.  Generalized: Well developed, in no acute distress  Chest: Lungs clear to auscultation bilaterally  Neurological  examination  Mentation: Alert oriented to time, place, history taking. Follows all commands speech and language fluent Cranial nerve II-XII: Extraocular movements were full, visual field were full on confrontational test Head turning and shoulder shrug  were normal and symmetric. Motor: The motor testing reveals 5 over 5 strength of all 4 extremities. Good symmetric motor tone is noted throughout.  Sensory: Sensory testing is intact to soft touch on all 4 extremities. No evidence of extinction is noted.  Gait and station: Gait is normal.   DIAGNOSTIC DATA (LABS, IMAGING, TESTING) - I reviewed patient records, labs, notes, testing and imaging myself where available.  Lab Results  Component Value Date   WBC 6.5 09/29/2018   HGB 12.4 09/29/2018   HCT 37.4 09/29/2018   MCV 81.6 09/29/2018   PLT 193.0 09/29/2018      Component Value Date/Time   NA 142 09/29/2018 0845   K 4.1 09/29/2018 0845   CL 105 09/29/2018 0845   CO2 29 09/29/2018 0845   GLUCOSE 149 (H) 09/29/2018 0845   BUN 10 09/29/2018 0845   CREATININE 0.75 09/29/2018 0845   CALCIUM 10.1 09/29/2018 0845   PROT 7.4 09/29/2018 0845   ALBUMIN 3.9 09/29/2018 0845   AST 19 09/29/2018 0845   ALT 17 09/29/2018 0845   ALKPHOS 47 09/29/2018 0845   BILITOT 0.7 09/29/2018 0845   GFRNONAA 87.17 02/16/2010 1003   GFRAA 156 02/19/2008 1103   Lab Results  Component Value Date   CHOL 119 09/29/2018   HDL 38.10 (L) 09/29/2018   LDLCALC 55 09/29/2018   TRIG 131.0 09/29/2018   CHOLHDL 3 09/29/2018   Lab Results  Component Value Date   HGBA1C 7.2 (A) 03/30/2019   No results found for: VITAMINB12 Lab Results  Component Value Date   TSH 1.13 09/14/2013      ASSESSMENT AND PLAN 85 y.o. year old female   has a past medical history of Anxiety, Baker's cyst, Chest pain, Diabetes mellitus type 2, uncontrolled ([place]), Diabetes mellitus without complication ([place]), Disc disease, degenerative, cervical, Diverticulosis of colon, DJD (degenerative joint disease) of knee, Facial pain, atypical, GERD (gastroesophageal reflux disease), Glaucoma, Hypercholesteremia, Hypertension, Lumbar spondylosis, Obesity, Onychomycosis, and Osteopenia. here with:  1. Obstructive sleep apnea on CPAP  Patient CPAP download shows excellent compliance and good treatment of her apnea.  She is encouraged to continue using CPAP nightly and greater than 4 hours each night.  An order was sent to her DME company requesting that they show her how to change out her supplies.  She is advised that if her symptoms worsen or she develops new symptoms she should let us know.  She will follow-up in 1 year or sooner if needed   I spent 15 minutes with the patient. 50% of this time was spent reviewing CPAP download   Ward Givens, MSN, NP-C 05/15/2019, 1:24 PM [facility] [address] [place], [place] [address] [phone]

## 2019-05-15 NOTE — Patient Instructions (Signed)
Continue using CPAP nightly and greater than 4 hours each night °If your symptoms worsen or you develop new symptoms please let us know.  ° °

## 2019-05-17 DIAGNOSIS — G4733 Obstructive sleep apnea (adult) (pediatric): Secondary | ICD-10-CM | POA: Diagnosis not present

## 2019-06-19 DIAGNOSIS — E113511 Type 2 diabetes mellitus with proliferative diabetic retinopathy with macular edema, right eye: Secondary | ICD-10-CM | POA: Diagnosis not present

## 2019-06-19 DIAGNOSIS — H35071 Retinal telangiectasis, right eye: Secondary | ICD-10-CM | POA: Diagnosis not present

## 2019-06-19 DIAGNOSIS — H35041 Retinal micro-aneurysms, unspecified, right eye: Secondary | ICD-10-CM | POA: Diagnosis not present

## 2019-06-19 DIAGNOSIS — E113311 Type 2 diabetes mellitus with moderate nonproliferative diabetic retinopathy with macular edema, right eye: Secondary | ICD-10-CM | POA: Diagnosis not present

## 2019-06-26 DIAGNOSIS — E113311 Type 2 diabetes mellitus with moderate nonproliferative diabetic retinopathy with macular edema, right eye: Secondary | ICD-10-CM | POA: Diagnosis not present

## 2019-06-26 DIAGNOSIS — G4733 Obstructive sleep apnea (adult) (pediatric): Secondary | ICD-10-CM | POA: Diagnosis not present

## 2019-06-26 DIAGNOSIS — E113312 Type 2 diabetes mellitus with moderate nonproliferative diabetic retinopathy with macular edema, left eye: Secondary | ICD-10-CM | POA: Diagnosis not present

## 2019-06-26 DIAGNOSIS — H35072 Retinal telangiectasis, left eye: Secondary | ICD-10-CM | POA: Diagnosis not present

## 2019-07-23 ENCOUNTER — Other Ambulatory Visit: Payer: Self-pay | Admitting: Internal Medicine

## 2019-07-23 MED ORDER — METFORMIN HCL 1000 MG PO TABS: 1000.0000 mg | ORAL_TABLET | Freq: Two times a day (BID) | ORAL | 1 refills | Status: DC

## 2019-07-23 MED ORDER — DORZOLAMIDE HCL-TIMOLOL MAL 2-0.5 % OP SOLN: 1.0000 [drp] | Freq: Two times a day (BID) | OPHTHALMIC | 0 refills | Status: DC

## 2019-07-23 MED ORDER — SIMVASTATIN 20 MG PO TABS: 20.0000 mg | ORAL_TABLET | Freq: Every day | ORAL | 1 refills | Status: DC

## 2019-07-23 MED ORDER — GLIMEPIRIDE 4 MG PO TABS: ORAL_TABLET | ORAL | 1 refills | Status: DC

## 2019-07-23 MED ORDER — PIOGLITAZONE HCL 30 MG PO TABS: 30.0000 mg | ORAL_TABLET | Freq: Every day | ORAL | 1 refills | Status: DC

## 2019-07-23 MED ORDER — LISINOPRIL-HYDROCHLOROTHIAZIDE 20-12.5 MG PO TABS: 1.0000 | ORAL_TABLET | Freq: Every day | ORAL | 1 refills | Status: DC

## 2019-07-23 NOTE — Telephone Encounter (Signed)
New message:   1.Medication Requested: dorzolamide-timolol (COSOPT) 22.3-6.8 MG/ML ophthalmic solution simvastatin (ZOCOR) 20 MG tablet lisinopril-hydrochlorothiazide (ZESTORETIC) 20-12.5 MG tablet metFORMIN (GLUCOPHAGE) 1000 MG tablet pioglitazone (ACTOS) 30 MG tablet glimepiride (AMARYL) 4 MG tablet 2. Pharmacy (Name, [place], [place]): [facility] - [place], [place] 3. On Med List: yes  4. Last Visit with PCP: 03/30/19  5. Next visit date with PCP:09/28/19  Pt states the eye drops were called in two weeks ago by the pharmacy and she has yet to receive them and states she is almost out Agent: Please be advised that RX refills may take up to 3 business days. We ask that you follow-up with your pharmacy.

## 2019-07-23 NOTE — Telephone Encounter (Signed)
erx sent as requested.  

## 2019-08-09 ENCOUNTER — Other Ambulatory Visit: Payer: Self-pay

## 2019-08-09 NOTE — Patient Outreach (Signed)
  [facility]) Care Management Chronic Special Needs Program    08/09/2019  Name: Whitney [name], DOB: 11-21-1934  MRN: [id number]   [name] is enrolled in a chronic special needs plan for Diabetes. RNCM received voice message requesting a return call. RNCM called both home and mobile number. No answer. HIPAA compliant message left on mobile phone number.  Plan: RNCM will await return call and continue to follow.  Thea Silversmith, RN, MSN, [place] [phone]

## 2019-08-13 DIAGNOSIS — H35071 Retinal telangiectasis, right eye: Secondary | ICD-10-CM | POA: Insufficient documentation

## 2019-08-13 DIAGNOSIS — E113311 Type 2 diabetes mellitus with moderate nonproliferative diabetic retinopathy with macular edema, right eye: Secondary | ICD-10-CM | POA: Insufficient documentation

## 2019-08-13 DIAGNOSIS — H35072 Retinal telangiectasis, left eye: Secondary | ICD-10-CM | POA: Insufficient documentation

## 2019-08-13 DIAGNOSIS — E113312 Type 2 diabetes mellitus with moderate nonproliferative diabetic retinopathy with macular edema, left eye: Secondary | ICD-10-CM | POA: Insufficient documentation

## 2019-08-14 ENCOUNTER — Ambulatory Visit (INDEPENDENT_AMBULATORY_CARE_PROVIDER_SITE_OTHER): Payer: HMO | Admitting: Ophthalmology

## 2019-08-14 ENCOUNTER — Other Ambulatory Visit: Payer: Self-pay

## 2019-08-14 ENCOUNTER — Encounter (INDEPENDENT_AMBULATORY_CARE_PROVIDER_SITE_OTHER): Payer: Self-pay | Admitting: Ophthalmology

## 2019-08-14 DIAGNOSIS — Z9989 Dependence on other enabling machines and devices: Secondary | ICD-10-CM | POA: Diagnosis not present

## 2019-08-14 DIAGNOSIS — E113311 Type 2 diabetes mellitus with moderate nonproliferative diabetic retinopathy with macular edema, right eye: Secondary | ICD-10-CM | POA: Diagnosis not present

## 2019-08-14 DIAGNOSIS — G4733 Obstructive sleep apnea (adult) (pediatric): Secondary | ICD-10-CM | POA: Diagnosis not present

## 2019-08-14 DIAGNOSIS — H35071 Retinal telangiectasis, right eye: Secondary | ICD-10-CM | POA: Diagnosis not present

## 2019-08-14 DIAGNOSIS — E113312 Type 2 diabetes mellitus with moderate nonproliferative diabetic retinopathy with macular edema, left eye: Secondary | ICD-10-CM | POA: Diagnosis not present

## 2019-08-14 DIAGNOSIS — H35072 Retinal telangiectasis, left eye: Secondary | ICD-10-CM | POA: Diagnosis not present

## 2019-08-14 MED ORDER — BEVACIZUMAB CHEMO INJECTION 1.25MG/0.05ML SYRINGE FOR KALEIDOSCOPE
1.2500 mg | INTRAVITREAL | Status: AC | PRN
  Administered 2019-08-14: 1.25 mg via INTRAVITREAL

## 2019-08-14 NOTE — Progress Notes (Signed)
08/14/2019     CHIEF COMPLAINT Patient presents for Retina Follow Up   HISTORY OF PRESENT ILLNESS: Whitney [name] is a 85 y.o. female who presents to the clinic today for:   HPI    Retina Follow Up    Patient presents with  Diabetic Retinopathy.  In right eye.  Duration of 8 weeks.  Since onset it is stable.          Comments    8 week follow up- OCT OU, Possible Avastin OD Patient denies change in vision and overall has no complaints. LBS 200 this AM A1C 7.16 Mar 2019       Last edited by Gerda Diss on 08/14/2019  8:47 AM. (History)      Referring physician: [name], MD [place],  [place] [address]  HISTORICAL INFORMATION:   Selected notes from the MEDICAL RECORD NUMBER    Lab Results  Component Value Date   HGBA1C 7.2 (A) 03/30/2019     CURRENT MEDICATIONS: Current Outpatient Medications (Ophthalmic Drugs)  Medication Sig  . dorzolamide-timolol (COSOPT) 22.3-6.8 MG/ML ophthalmic solution Place 1 drop into both eyes 2 (two) times daily.  . DUREZOL 0.05 % EMUL    No current facility-administered medications for this visit. (Ophthalmic Drugs)   Current Outpatient Medications (Other)  Medication Sig  . aspirin 81 MG tablet Take 1 tablet (81 mg total) by mouth daily.  . blood glucose meter kit and supplies KIT Use to test blood sugar up to 3 times daily. DX E11.9  . Calcium Carb-Cholecalciferol (CALCIUM-VITAMIN D) 500-200 MG-UNIT tablet Take 1 tablet by mouth 2 (two) times daily with a meal.  . diclofenac Sodium (VOLTAREN) 1 % GEL Apply 4 g topically 4 (four) times daily.  [place] Kitchen glimepiride (AMARYL) 4 MG tablet TAKE 1 TABLET EVERY DAY BEFORE BREAKFAST  . glucose blood (ONETOUCH VERIO) test strip Used to check blood sugars three time daily  . Lancet Devices (ACCU-CHEK SOFTCLIX) lancets Use to test blood sugar up to 3 times a day. DX E11.9  . lisinopril-hydrochlorothiazide (ZESTORETIC) 20-12.5 MG tablet Take 1 tablet by mouth daily.  .  metFORMIN (GLUCOPHAGE) 1000 MG tablet Take 1 tablet (1,000 mg total) by mouth 2 (two) times daily with a meal.  . Multiple Vitamin (MULTI VITAMIN DAILY PO) Take one by mouth daily  . pioglitazone (ACTOS) 30 MG tablet Take 1 tablet (30 mg total) by mouth daily.  . simvastatin (ZOCOR) 20 MG tablet Take 1 tablet (20 mg total) by mouth at bedtime.   No current facility-administered medications for this visit. (Other)      REVIEW OF SYSTEMS:    ALLERGIES No Known Allergies  PAST MEDICAL HISTORY Past Medical History:  Diagnosis Date  . Anxiety   . Baker's cyst   . Chest pain   . Diabetes mellitus type 2, uncontrolled ([place])   . Diabetes mellitus without complication ([place])   . Disc disease, degenerative, cervical   . Diverticulosis of colon   . DJD (degenerative joint disease) of knee   . Facial pain, atypical   . GERD (gastroesophageal reflux disease)   . Glaucoma   . Hypercholesteremia   . Hypertension   . Lumbar spondylosis   . Obesity   . Onychomycosis   . Osteopenia    Past Surgical History:  Procedure Laterality Date  . right eye surgery  09/2010   retina in right eye  . VAGINAL HYSTERECTOMY  2002   Dr. Kem Kays repair  FAMILY HISTORY Family History  Problem Relation Age of Onset  . Diabetes Mother   . Colon cancer Neg Hx   . Stomach cancer Neg Hx     SOCIAL HISTORY Social History   Tobacco Use  . Smoking status: Never Smoker  . Smokeless tobacco: Never Used  Substance Use Topics  . Alcohol use: No  . Drug use: No         OPHTHALMIC EXAM:  Base Eye Exam    Visual Acuity (Snellen - Linear)      Right Left   Dist cc 20/25-2 20/30+2   Dist ph cc  NI   Correction: Glasses       Tonometry (Tonopen, 8:52 AM)      Right Left   Pressure 9 11       Pupils      Pupils Dark Light Shape React APD   Right PERRL 4 3 Irregular Slow None   Left PERRL 4 3 Irregular Slow None       Visual Fields (Counting fingers)      Left Right    Full Full         Extraocular Movement      Right Left    Full Full       Neuro/Psych    Oriented x3: Yes   Mood/Affect: Normal       Dilation    Right eye: 1.0% Mydriacyl, 2.5% Phenylephrine @ 8:53 AM        Slit Lamp and Fundus Exam    External Exam      Right Left   External Normal Normal       Slit Lamp Exam      Right Left   Lids/Lashes Normal Normal   Conjunctiva/Sclera White and quiet White and quiet   Cornea Clear Clear   Anterior Chamber Deep and quiet Deep and quiet   Iris Round and reactive Round and reactive   Lens Posterior chamber intraocular lens Posterior chamber intraocular lens   Anterior Vitreous Normal Normal       Fundus Exam      Right Left   Posterior Vitreous Normal Normal   Disc Normal Normal   C/D Ratio 0.55 0.55   Macula Microaneurysms Microaneurysms   Vessels NPDR, moderate NPDR, moderate   Periphery Normal Normal          IMAGING AND PROCEDURES  Imaging and Procedures for 08/14/19  OCT, Retina - OU - Both Eyes       Right Eye Quality was good. Scan locations included subfoveal. Central Foveal Thickness: 258. Progression has improved. Findings include abnormal foveal contour.   Left Eye Quality was good. Scan locations included subfoveal. Central Foveal Thickness: 246. Progression has improved.   Notes OD with much less clinically significant macular edema, and macular telangiectasis on 8-week interval examination and intravitreal Avastin OD.  Continues on CPAP.  OS also much improved on Periodic examinations and CPAP.       Intravitreal Injection, Pharmacologic Agent - OD - Right Eye       Time Out 08/14/2019. 10:14 AM. Confirmed correct patient, procedure, site, and patient consented.   Anesthesia Topical anesthesia was used. Anesthetic medications included Akten 3.5%.   Procedure Preparation included Tobramycin 0.3%, 10% betadine to eyelids. A 30 gauge needle was used.   Injection:  1.25 mg Bevacizumab (AVASTIN) SOLN    NDC: [address]-8, Lot: [id number]   Route: Intravitreal, Site: Right Eye, Waste: 0 mg  Post-op Post injection exam  found visual acuity of at least counting fingers. The patient tolerated the procedure well. There were no complications. The patient received written and verbal post procedure care education. Post injection medications were not given.                 ASSESSMENT/PLAN:  Moderate nonproliferative diabetic retinopathy of right eye with macular edema (HCC)  The nature of diabetic macular edema was discussed with the patient. Treatment options were outlined including medical therapy, laser & vitrectomy. The use of injectable medications reviewed, including Avastin, Lucentis, and Eylea. Periodic injections into the eye are likely to resolve diabetic macular edema (swelling in the center of vision). Initially, injections are delivered are delivered every 4-6 weeks, and the interval extended as the condition improves. On average, 8-9 injections the first year, and 5 in year 2. Improvement in the condition most often improves on medical therapy. Occasional use of focal laser is also recommended for residual macular edema (swelling). Excellent control of blood glucose and blood pressure are encouraged under the care of a primary physician or endocrinologist. Similarly, attempts to maintain serum cholesterol, low density lipoproteins, and high-density lipoproteins in a favorable range were recommended.   OD, while CSME is active, it is now responsive to antiveg F therapy as patient continues on CPAP for newly diagnosed obstructive sleep apnea.  Repeat injection of Avastin OD today at 8-week interval  Retinal telangiectasia of right eye Macular telangiectasis (MAC-TEL), or parafoveal telangiectasis is a condition of "unknown" cause.  Findings in or near the macula (center of vision) consist of microaneurysms (leaking small capillaries), often with leakage of fluid which in the active phase can  impact fine discriminatory vision, and in some cases trigger profound scarring in the macula, with severe permanent vision loss.  Standard treatment is observation and periodic examinations to monitor for treatable complications.   The cause  of this condition is "unknown".  However, the practice of Dr. Zadie Rhine has discovered an association with sleep apnea with its nightly periods of low oxygen in the blood stream (hypoxia), retained carbon dioxide (hypercapnia), associated with transient nocturnal hypertensive episodes.   More recently, some patients also been found to have advanced lung disease, whether asthma or COPD, with similar findings.  Dr. Zadie Rhine has been evaluating the association of sleep apnea, nightly hypoxia, and Macular telangiectasis for over 18 years.  Most patients are found to be noncompliant with sleep apnea therapy or testing in the past.  Resumption of CPAP or similar therapy is strongly recommended if ordered in the past.  Upon review of risk factors or findings positive for sleep apnea, more formal, extensive sleep laboratory or home testing, may be recommended.  Numerous patients, proven to have MAC-TEL, have improved or resolved their ey condition promptly, within weeks, of the use of nighttime oxygen supplementation or continuous positive airway pressure (CPAP).      ICD-10-CM   1. Moderate nonproliferative diabetic retinopathy of right eye with macular edema associated with type 2 diabetes mellitus (HCC)  E11.3311 OCT, Retina - OU - Both Eyes    Intravitreal Injection, Pharmacologic Agent - OD - Right Eye    Bevacizumab (AVASTIN) SOLN 1.25 mg  2. Moderate nonproliferative diabetic retinopathy of left eye with macular edema associated with type 2 diabetes mellitus ([place])  W58.0998   3. Type 2 macular telangiectasis, left  H35.072   4. Retinal telangiectasia of right eye  H35.071   5. OSA on CPAP  G47.33    Z99.89  1.  OD, looks much improved on intravitreal Avastin at  8-week examination.  Condition is now responsive to antiveg F now that patient is on continued CPAP and enjoying its use.  2.  3.  Ophthalmic Meds Ordered this visit:  Meds ordered this encounter  Medications  . Bevacizumab (AVASTIN) SOLN 1.25 mg       Return in about 8 weeks (around 10/09/2019) for OD, AVASTIN OCT.  There are no Patient Instructions on file for this visit.   Explained the diagnoses, plan, and follow up with the patient and they expressed understanding.  Patient expressed understanding of the importance of proper follow up care.   Whitney [name] M.D. Diseases & Surgery of the Retina and Vitreous Retina & Diabetic [place] 08/14/19     Abbreviations: M myopia (nearsighted); A astigmatism; H hyperopia (farsighted); P presbyopia; Mrx spectacle prescription;  CTL contact lenses; OD right eye; OS left eye; OU both eyes  XT exotropia; ET esotropia; PEK punctate epithelial keratitis; PEE punctate epithelial erosions; DES dry eye syndrome; MGD meibomian gland dysfunction; ATs artificial tears; PFAT's preservative free artificial tears; [place] nuclear sclerotic cataract; PSC posterior subcapsular cataract; ERM epi-retinal membrane; PVD posterior vitreous detachment; RD retinal detachment; DM diabetes mellitus; DR diabetic retinopathy; NPDR non-proliferative diabetic retinopathy; PDR proliferative diabetic retinopathy; CSME clinically significant macular edema; DME diabetic macular edema; dbh dot blot hemorrhages; CWS cotton wool spot; POAG primary open angle glaucoma; C/D cup-to-disc ratio; HVF humphrey visual field; GVF goldmann visual field; OCT optical coherence tomography; IOP intraocular pressure; BRVO Branch retinal vein occlusion; CRVO central retinal vein occlusion; CRAO central retinal artery occlusion; BRAO branch retinal artery occlusion; RT retinal tear; SB scleral buckle; PPV pars plana vitrectomy; VH Vitreous hemorrhage; PRP panretinal laser photocoagulation; IVK  intravitreal kenalog; VMT vitreomacular traction; MH Macular hole;  NVD neovascularization of the disc; NVE neovascularization elsewhere; AREDS age related eye disease study; ARMD age related macular degeneration; POAG primary open angle glaucoma; EBMD epithelial/anterior basement membrane dystrophy; ACIOL anterior chamber intraocular lens; IOL intraocular lens; PCIOL posterior chamber intraocular lens; Phaco/IOL phacoemulsification with intraocular lens placement; [place] photorefractive keratectomy; LASIK laser assisted in situ keratomileusis; HTN hypertension; DM diabetes mellitus; COPD chronic obstructive pulmonary disease

## 2019-08-14 NOTE — Assessment & Plan Note (Signed)
Macular telangiectasis (MAC-TEL), or parafoveal telangiectasis is a condition of "unknown" cause.  Findings in or near the macula (center of vision) consist of microaneurysms (leaking small capillaries), often with leakage of fluid which in the active phase can impact fine discriminatory vision, and in some cases trigger profound scarring in the macula, with severe permanent vision loss.  Standard treatment is observation and periodic examinations to monitor for treatable complications.   The cause  of this condition is "unknown".  However, the practice of Dr. Corinthian Kemler has discovered an association with sleep apnea with its nightly periods of low oxygen in the blood stream (hypoxia), retained carbon dioxide (hypercapnia), associated with transient nocturnal hypertensive episodes.   More recently, some patients also been found to have advanced lung disease, whether asthma or COPD, with similar findings.  Dr. Heyden Jaber has been evaluating the association of sleep apnea, nightly hypoxia, and Macular telangiectasis for over 18 years.  Most patients are found to be noncompliant with sleep apnea therapy or testing in the past.  Resumption of CPAP or similar therapy is strongly recommended if ordered in the past.  Upon review of risk factors or findings positive for sleep apnea, more formal, extensive sleep laboratory or home testing, may be recommended.  Numerous patients, proven to have MAC-TEL, have improved or resolved their ey condition promptly, within weeks, of the use of nighttime oxygen supplementation or continuous positive airway pressure (CPAP). 

## 2019-08-14 NOTE — Assessment & Plan Note (Signed)
The nature of diabetic macular edema was discussed with the patient. Treatment options were outlined including medical therapy, laser & vitrectomy. The use of injectable medications reviewed, including Avastin, Lucentis, and Eylea. Periodic injections into the eye are likely to resolve diabetic macular edema (swelling in the center of vision). Initially, injections are delivered are delivered every 4-6 weeks, and the interval extended as the condition improves. On average, 8-9 injections the first year, and 5 in year 2. Improvement in the condition most often improves on medical therapy. Occasional use of focal laser is also recommended for residual macular edema (swelling). Excellent control of blood glucose and blood pressure are encouraged under the care of a primary physician or endocrinologist. Similarly, attempts to maintain serum cholesterol, low density lipoproteins, and high-density lipoproteins in a favorable range were recommended.   OD, while CSME is active, it is now responsive to antiveg F therapy as patient continues on CPAP for newly diagnosed obstructive sleep apnea.  Repeat injection of Avastin OD today at 8-week interval

## 2019-08-23 ENCOUNTER — Other Ambulatory Visit: Payer: Self-pay | Admitting: Internal Medicine

## 2019-09-04 ENCOUNTER — Ambulatory Visit (INDEPENDENT_AMBULATORY_CARE_PROVIDER_SITE_OTHER): Payer: HMO | Admitting: Internal Medicine

## 2019-09-04 ENCOUNTER — Ambulatory Visit (INDEPENDENT_AMBULATORY_CARE_PROVIDER_SITE_OTHER): Payer: HMO | Admitting: Ophthalmology

## 2019-09-04 ENCOUNTER — Telehealth: Payer: Self-pay

## 2019-09-04 ENCOUNTER — Other Ambulatory Visit: Payer: Self-pay

## 2019-09-04 ENCOUNTER — Encounter: Payer: Self-pay | Admitting: Internal Medicine

## 2019-09-04 ENCOUNTER — Encounter (INDEPENDENT_AMBULATORY_CARE_PROVIDER_SITE_OTHER): Payer: Self-pay | Admitting: Ophthalmology

## 2019-09-04 ENCOUNTER — Ambulatory Visit (INDEPENDENT_AMBULATORY_CARE_PROVIDER_SITE_OTHER): Payer: HMO

## 2019-09-04 VITALS — BP 164/86 | HR 90 | Temp 98.9°F | Ht 61.0 in | Wt 163.8 lb

## 2019-09-04 DIAGNOSIS — R0789 Other chest pain: Secondary | ICD-10-CM

## 2019-09-04 DIAGNOSIS — H35072 Retinal telangiectasis, left eye: Secondary | ICD-10-CM

## 2019-09-04 DIAGNOSIS — M25512 Pain in left shoulder: Secondary | ICD-10-CM

## 2019-09-04 DIAGNOSIS — H35071 Retinal telangiectasis, right eye: Secondary | ICD-10-CM | POA: Diagnosis not present

## 2019-09-04 DIAGNOSIS — E785 Hyperlipidemia, unspecified: Secondary | ICD-10-CM | POA: Diagnosis not present

## 2019-09-04 DIAGNOSIS — E1169 Type 2 diabetes mellitus with other specified complication: Secondary | ICD-10-CM | POA: Diagnosis not present

## 2019-09-04 DIAGNOSIS — M19012 Primary osteoarthritis, left shoulder: Secondary | ICD-10-CM | POA: Diagnosis not present

## 2019-09-04 DIAGNOSIS — E113312 Type 2 diabetes mellitus with moderate nonproliferative diabetic retinopathy with macular edema, left eye: Secondary | ICD-10-CM | POA: Diagnosis not present

## 2019-09-04 LAB — COMPREHENSIVE METABOLIC PANEL
ALT: 16 U/L (ref 0–35)
AST: 19 U/L (ref 0–37)
Albumin: 4.1 g/dL (ref 3.5–5.2)
Alkaline Phosphatase: 54 U/L (ref 39–117)
BUN: 11 mg/dL (ref 6–23)
CO2: 31 mEq/L (ref 19–32)
Calcium: 10.3 mg/dL (ref 8.4–10.5)
Chloride: 101 mEq/L (ref 96–112)
Creatinine, Ser: 0.77 mg/dL (ref 0.40–1.20)
GFR: 86.72 mL/min (ref >60.00)
Glucose, Bld: 238 mg/dL — ABNORMAL HIGH (ref 70–99)
Potassium: 3.4 mEq/L — ABNORMAL LOW (ref 3.5–5.1)
Sodium: 138 mEq/L (ref 135–145)
Total Bilirubin: 1.1 mg/dL (ref 0.2–1.2)
Total Protein: 8 g/dL (ref 6.0–8.3)

## 2019-09-04 LAB — LIPID PANEL
Cholesterol: 139 mg/dL (ref 0–200)
HDL: 40.9 mg/dL (ref >39.00)
LDL Cholesterol: 76 mg/dL (ref 0–99)
NonHDL: 98.48
Total CHOL/HDL Ratio: 3
Triglycerides: 111 mg/dL (ref 0.0–149.0)
VLDL: 22.2 mg/dL (ref 0.0–40.0)

## 2019-09-04 LAB — TROPONIN I (HIGH SENSITIVITY): High Sens Troponin I: 5 ng/L (ref 2–17)

## 2019-09-04 LAB — CBC
HCT: 38 % (ref 36.0–46.0)
Hemoglobin: 12.5 g/dL (ref 12.0–15.0)
MCHC: 33 g/dL (ref 30.0–36.0)
MCV: 83 fl (ref 78.0–100.0)
Platelets: 199 10*3/uL (ref 150.0–400.0)
RBC: 4.58 Mil/uL (ref 3.87–5.11)
RDW: 15.2 % (ref 11.5–15.5)
WBC: 7.5 10*3/uL (ref 4.0–10.5)

## 2019-09-04 LAB — BRAIN NATRIURETIC PEPTIDE: Pro B Natriuretic peptide (BNP): 33 pg/mL (ref 0.0–100.0)

## 2019-09-04 MED ORDER — AFLIBERCEPT 2MG/0.05ML IZ SOLN FOR KALEIDOSCOPE
2.0000 mg | INTRAVITREAL | Status: AC | PRN
  Administered 2019-09-04: 2 mg via INTRAVITREAL

## 2019-09-04 MED ORDER — PREDNISONE 20 MG PO TABS
40.0000 mg | ORAL_TABLET | Freq: Every day | ORAL | 0 refills | Status: DC
Start: 2019-09-04 — End: 2019-09-28

## 2019-09-04 NOTE — Assessment & Plan Note (Addendum)
Macular telangiectasis (MAC-TEL), or parafoveal telangiectasis is a condition of "unknown" cause.  Findings in or near the macula (center of vision) consist of microaneurysms (leaking small capillaries), often with leakage of fluid which in the active phase can impact fine discriminatory vision, and in some cases trigger profound scarring in the macula, with severe permanent vision loss.  Standard treatment is observation and periodic examinations to monitor for treatable complications.   The cause  of this condition is "unknown".  However, the practice of Dr. Zadie Rhine has discovered an association with sleep apnea with its nightly periods of low oxygen in the blood stream (hypoxia), retained carbon dioxide (hypercapnia), associated with transient nocturnal hypertensive episodes.   More recently, some patients also been found to have advanced lung disease, whether asthma or COPD, with similar findings.  Dr. Zadie Rhine has been evaluating the association of sleep apnea, nightly hypoxia, and Macular telangiectasis for over 18 years.  Most patients are found to be noncompliant with sleep apnea therapy or testing in the past.  Resumption of CPAP or similar therapy is strongly recommended if ordered in the past.  Upon review of risk factors or findings positive for sleep apnea, more formal, extensive sleep laboratory or home testing, may be recommended.  Numerous patients, proven to have MAC-TEL, have improved or resolved their ey condition promptly, within weeks, of the use of nighttime oxygen supplementation or continuous positive airway pressure (CPAP).OS, much worse, recent mask issues. These are being remedied.

## 2019-09-04 NOTE — Progress Notes (Signed)
   Subjective:   Patient ID: Whitney [name], female    DOB: 16-Nov-1934, 85 y.o.   MRN: [id number]  HPI The patient is an 85 YO female coming in for concerns about left neck and shoulder pain which radiates into the ear and chest. Started Sunday without any particular activity. She denies recent strain or sprain to the neck/shoulder. She does have a fatty region on the left collar bone region which she feels is swollen today. Pain is bad and consistent, causing her to not be able to sleep well. Denies numbness or tingling in the left arm. Overall stable over the last several days rather than coming and going. Denies triggers for worsening except certain positions. Has used voltaren gel which helped a little. Stress test 2006 negative  Review of Systems  Constitutional: Negative.   HENT: Negative.   Eyes: Negative.   Respiratory: Negative for cough, chest tightness and shortness of breath.   Cardiovascular: Positive for chest pain. Negative for palpitations and leg swelling.  Gastrointestinal: Negative for abdominal distention, abdominal pain, constipation, diarrhea, nausea and vomiting.  Musculoskeletal: Positive for arthralgias and myalgias.  Skin: Negative.   Neurological: Negative.   Psychiatric/Behavioral: Negative.     Objective:  Physical Exam Constitutional:      Appearance: She is well-developed.  HENT:     Head: Normocephalic and atraumatic.  Cardiovascular:     Rate and Rhythm: Normal rate and regular rhythm.  Pulmonary:     Effort: Pulmonary effort is normal. No respiratory distress.     Breath sounds: Normal breath sounds. No wheezing or rales.  Abdominal:     General: Bowel sounds are normal. There is no distension.     Palpations: Abdomen is soft.     Tenderness: There is no abdominal tenderness. There is no rebound.  Musculoskeletal:        General: Tenderness present.     Cervical back: Normal range of motion.     Comments: Pain to palpation left scapular region  and left shoulder, fatty deposit left clavicle region without tenderness, chest wall without tenderness  Skin:    General: Skin is warm and dry.  Neurological:     Mental Status: She is alert and oriented to person, place, and time.     Coordination: Coordination normal.     Vitals:   09/04/19 1354  BP: (!) 164/86  Pulse: 90  Temp: 98.9 F (37.2 C)  SpO2: 99%  Weight: 163 lb 12.8 oz (74.3 kg)  Height: 5\' 1"  (1.549 m)   EKG: Rate 85, axis normal, interval normal, sinus, t wave inversions V1 and V3, ST change in V4 which does not meet voltage criteria for true elevation, compared to 2011 EKG there has been change with ST/wave inversion new V3, V1  This visit occurred during the SARS-CoV-2 public health emergency.  Safety protocols were in place, including screening questions prior to the visit, additional usage of staff PPE, and extensive cleaning of exam room while observing appropriate contact time as indicated for disinfecting solutions.   Assessment & Plan:

## 2019-09-04 NOTE — Patient Instructions (Signed)
We will check the x-ray of the shoulder today.  We have sent in prednisone to take 2 pills daily for 5 days.  We are also checking some blood work today to check the heart.

## 2019-09-04 NOTE — Assessment & Plan Note (Signed)
Description not consistent with cardiac etiology. EKG done and with some new changes from 2011 although not consistent with STEMI. Checking troponin and BNP, CMP, CBC to rule out cardiac etiology. May still need referral for stress testing if labs normal given changes on EKG.

## 2019-09-04 NOTE — Assessment & Plan Note (Signed)
The nature of diabetic macular edema was discussed with the patient. Treatment options were outlined including medical therapy, laser & vitrectomy. The use of injectable medications reviewed, including Avastin, Lucentis, and Eylea. Periodic injections into the eye are likely to resolve diabetic macular edema (swelling in the center of vision). Initially, injections are delivered are delivered every 4-6 weeks, and the interval extended as the condition improves. On average, 8-9 injections the first year, and 5 in year 2. Improvement in the condition most often improves on medical therapy. Occasional use of focal laser is also recommended for residual macular edema (swelling). Excellent control of blood glucose and blood pressure are encouraged under the care of a primary physician or endocrinologist. Similarly, attempts to maintain serum cholesterol, low density lipoproteins, and high-density lipoproteins in a favorable range were recommended.   OS, slightly worse at 10-week exam interval. May have some role of MAC-TEL worse since CPAP mask poorly fitting. Repeat Eylea OS today and examination in 8 weeks

## 2019-09-04 NOTE — Assessment & Plan Note (Signed)
Checking x-ray left shoulder and rx for prednisone 40 mg 5 day course.

## 2019-09-04 NOTE — Progress Notes (Signed)
09/04/2019     CHIEF COMPLAINT Patient presents for Retina Follow Up   HISTORY OF PRESENT ILLNESS: Whitney [name] is a 85 y.o. female who presents to the clinic today for:   HPI    Retina Follow Up    Patient presents with  Diabetic Retinopathy.  In left eye.  This started 10 weeks ago.  Severity is mild.  Duration of 10 weeks.  Since onset it is stable.          Comments    10 Week Diabetic F/U OS, poss Eylea OS  Pt denies noticeable changes to [place] OU since last visit. Pt denies ocular pain, flashes of light, or floaters OU.  LBS: 159 this AM       Last edited by Rockie Neighbours, [place] on 09/04/2019  8:39 AM. (History)      Referring physician: [name], MD [place],  [place] [address]  HISTORICAL INFORMATION:   Selected notes from the MEDICAL RECORD NUMBER    Lab Results  Component Value Date   HGBA1C 7.2 (A) 03/30/2019     CURRENT MEDICATIONS: Current Outpatient Medications (Ophthalmic Drugs)  Medication Sig  . dorzolamide-timolol (COSOPT) 22.3-6.8 MG/ML ophthalmic solution Instill 1 drop in each eye twice a day  . DUREZOL 0.05 % EMUL    No current facility-administered medications for this visit. (Ophthalmic Drugs)   Current Outpatient Medications (Other)  Medication Sig  . aspirin 81 MG tablet Take 1 tablet (81 mg total) by mouth daily.  . blood glucose meter kit and supplies KIT Use to test blood sugar up to 3 times daily. DX E11.9  . Calcium Carb-Cholecalciferol (CALCIUM-VITAMIN D) 500-200 MG-UNIT tablet Take 1 tablet by mouth 2 (two) times daily with a meal.  . diclofenac Sodium (VOLTAREN) 1 % GEL Apply 4 g topically 4 (four) times daily.  Marland Kitchen glimepiride (AMARYL) 4 MG tablet TAKE 1 TABLET EVERY DAY BEFORE BREAKFAST  . Lancet Devices (ACCU-CHEK SOFTCLIX) lancets Use to test blood sugar up to 3 times a day. DX E11.9  . lisinopril-hydrochlorothiazide (ZESTORETIC) 20-12.5 MG tablet Take 1 tablet by mouth daily.  . metFORMIN  (GLUCOPHAGE) 1000 MG tablet Take 1 tablet (1,000 mg total) by mouth 2 (two) times daily with a meal.  . Multiple Vitamin (MULTI VITAMIN DAILY PO) Take one by mouth daily  . ONETOUCH VERIO test strip Use to test blood sugar 3 times a day  . pioglitazone (ACTOS) 30 MG tablet Take 1 tablet (30 mg total) by mouth daily.  . simvastatin (ZOCOR) 20 MG tablet Take 1 tablet (20 mg total) by mouth at bedtime.   No current facility-administered medications for this visit. (Other)      REVIEW OF SYSTEMS:    ALLERGIES No Known Allergies  PAST MEDICAL HISTORY Past Medical History:  Diagnosis Date  . Anxiety   . Baker's cyst   . Chest pain   . Diabetes mellitus type 2, uncontrolled ([place])   . Diabetes mellitus without complication ([place])   . Disc disease, degenerative, cervical   . Diverticulosis of colon   . DJD (degenerative joint disease) of knee   . Facial pain, atypical   . GERD (gastroesophageal reflux disease)   . Glaucoma   . Hypercholesteremia   . Hypertension   . Lumbar spondylosis   . Obesity   . Onychomycosis   . Osteopenia    Past Surgical History:  Procedure Laterality Date  . right eye surgery  09/2010  retina in right eye  . VAGINAL HYSTERECTOMY  2002   Dr. Kem Kays repair    FAMILY HISTORY Family History  Problem Relation Age of Onset  . Diabetes Mother   . Colon cancer Neg Hx   . Stomach cancer Neg Hx     SOCIAL HISTORY Social History   Tobacco Use  . Smoking status: Never Smoker  . Smokeless tobacco: Never Used  Substance Use Topics  . Alcohol use: No  . Drug use: No         OPHTHALMIC EXAM:  Base Eye Exam    Visual Acuity (ETDRS)      Right Left   Dist cc 20/25 -2 20/40 -2   Dist ph cc  20/30 -1   Correction: Glasses       Tonometry (Tonopen, 8:45 AM)      Right Left   Pressure 12 11       Pupils      Pupils Dark Light Shape React APD   Right PERRL 4 3 Irregular Brisk None   Left PERRL 4 3 Irregular Brisk None       Visual  Fields (Counting fingers)      Left Right    Full Full       Extraocular Movement      Right Left    Full Full       Neuro/Psych    Oriented x3: Yes   Mood/Affect: Normal       Dilation    Left eye: 1.0% Mydriacyl, 2.5% Phenylephrine @ 8:45 AM        Slit Lamp and Fundus Exam    External Exam      Right Left   External Normal Normal       Slit Lamp Exam      Right Left   Lids/Lashes Normal Normal   Conjunctiva/Sclera White and quiet White and quiet   Cornea Clear Clear   Anterior Chamber Deep and quiet Deep and quiet   Iris Round and reactive Round and reactive   Lens Posterior chamber intraocular lens Posterior chamber intraocular lens   Anterior Vitreous Normal Normal       Fundus Exam      Right Left   Posterior Vitreous Normal Normal   Disc Normal Normal   C/D Ratio  0.55   Macula  Microaneurysms, Macular thickening, Cystoid macular edema, Mild clinically significant macular edema   Vessels  NPDR, moderate   Periphery Normal Normal          IMAGING AND PROCEDURES  Imaging and Procedures for 09/04/19  OCT, Retina - OU - Both Eyes       Right Eye Quality was good. Scan locations included subfoveal. Central Foveal Thickness: 242. Progression has improved. Findings include abnormal foveal contour.   Left Eye Quality was good. Scan locations included subfoveal. Central Foveal Thickness: 255. Findings include abnormal foveal contour, cystoid macular edema.   Notes Micro-CME OD persist superiorly. And temporally. Overall improved. Some of this may actually be MAC-TEL superimposed upon CSME.  OS, Increased CME form of CSME temporal to the fovea in the region of classic MAC-TEL. This at 10-week interval, will need to decrease interval of therapy to 8 weeks and examination.         Intravitreal Injection, Pharmacologic Agent - OS - Left Eye       Time Out 09/04/2019. 9:31 AM. Confirmed correct patient, procedure, site, and patient consented.    Anesthesia Topical anesthesia was used.  Anesthetic medications included [place] 3.5%.   Procedure Preparation included Tobramycin 0.3%.   Injection:  2 mg aflibercept Alfonse Flavors) SOLN   NDC: [id number], Lot: [id number]   Route: Intravitreal, Site: Left Eye, Waste: 0 mg  Post-op Post injection exam found visual acuity of at least counting fingers. The patient tolerated the procedure well. There were no complications. The patient received written and verbal post procedure care education. Post injection medications were not given.                 ASSESSMENT/PLAN:  Moderate nonproliferative diabetic retinopathy of left eye with macular edema associated with type 2 diabetes mellitus ([place])  The nature of diabetic macular edema was discussed with the patient. Treatment options were outlined including medical therapy, laser & vitrectomy. The use of injectable medications reviewed, including Avastin, Lucentis, and Eylea. Periodic injections into the eye are likely to resolve diabetic macular edema (swelling in the center of vision). Initially, injections are delivered are delivered every 4-6 weeks, and the interval extended as the condition improves. On average, 8-9 injections the first year, and 5 in year 2. Improvement in the condition most often improves on medical therapy. Occasional use of focal laser is also recommended for residual macular edema (swelling). Excellent control of blood glucose and blood pressure are encouraged under the care of a primary physician or endocrinologist. Similarly, attempts to maintain serum cholesterol, low density lipoproteins, and high-density lipoproteins in a favorable range were recommended.   OS, slightly worse at 10-week exam interval. May have some role of MAC-TEL worse since CPAP mask poorly fitting. Repeat Eylea OS today and examination in 8 weeks  Retinal telangiectasia of right eye Macular telangiectasia and CME and CSME all relatively stable  although slightly worse only 3 weeks after injection of Eylea  Type 2 macular telangiectasis, left Macular telangiectasis (MAC-TEL), or parafoveal telangiectasis is a condition of "unknown" cause.  Findings in or near the macula (center of vision) consist of microaneurysms (leaking small capillaries), often with leakage of fluid which in the active phase can impact fine discriminatory vision, and in some cases trigger profound scarring in the macula, with severe permanent vision loss.  Standard treatment is observation and periodic examinations to monitor for treatable complications.   The cause  of this condition is "unknown".  However, the practice of Dr. Zadie Rhine has discovered an association with sleep apnea with its nightly periods of low oxygen in the blood stream (hypoxia), retained carbon dioxide (hypercapnia), associated with transient nocturnal hypertensive episodes.   More recently, some patients also been found to have advanced lung disease, whether asthma or COPD, with similar findings.  Dr. Zadie Rhine has been evaluating the association of sleep apnea, nightly hypoxia, and Macular telangiectasis for over 18 years.  Most patients are found to be noncompliant with sleep apnea therapy or testing in the past.  Resumption of CPAP or similar therapy is strongly recommended if ordered in the past.  Upon review of risk factors or findings positive for sleep apnea, more formal, extensive sleep laboratory or home testing, may be recommended.  Numerous patients, proven to have MAC-TEL, have improved or resolved their ey condition promptly, within weeks, of the use of nighttime oxygen supplementation or continuous positive airway pressure (CPAP).OS, much worse, recent mask issues. These are being remedied.      ICD-10-CM   1. Moderate nonproliferative diabetic retinopathy of left eye with macular edema associated with type 2 diabetes mellitus (HCC)  G38.7564 OCT, Retina - OU - Both Eyes  Intravitreal  Injection, Pharmacologic Agent - OS - Left Eye    aflibercept (EYLEA) SOLN 2 mg  2. Retinal telangiectasia of right eye  H35.071   3. Type 2 macular telangiectasis, left  H35.072     1.Micro-CME OD persist superiorly. And temporally. Overall improved. Some of this may actually be MAC-TEL superimposed upon CSME.  OS, Increased CME form of CSME temporal to the fovea in the region of classic MAC-TEL. This at 10-week interval, will need to decrease interval of therapy to 8 weeks and examination.   2. Patient does report some issues recently with mask fitting properly for CPAP. This is being remedied by a reception of new masks. May have some impact on active notes of antiveg F with the ongoing nightly hypoxia  3.  Ophthalmic Meds Ordered this visit:  Meds ordered this encounter  Medications  . aflibercept (EYLEA) SOLN 2 mg       Return in about 8 weeks (around 10/30/2019) for EYLEA OCT, OS.  There are no Patient Instructions on file for this visit.   Explained the diagnoses, plan, and follow up with the patient and they expressed understanding.  Patient expressed understanding of the importance of proper follow up care.   Clent Demark Caleigh Rabelo M.D. Diseases & Surgery of the Retina and Vitreous [facility]ina [facility] Diabetic [place] 09/04/19     Abbreviations: M myopia (nearsighted); A astigmatism; H hyperopia (farsighted); P presbyopia; Mrx spectacle prescription;  CTL contact lenses; OD right eye; OS left eye; OU both eyes  XT exotropia; ET esotropia; PEK punctate epithelial keratitis; PEE punctate epithelial erosions; DES dry eye syndrome; MGD meibomian gland dysfunction; ATs artificial tears; PFAT's preservative free artificial tears; [place] nuclear sclerotic cataract; PSC posterior subcapsular cataract; ERM epi-retinal membrane; PVD posterior vitreous detachment; RD retinal detachment; DM diabetes mellitus; DR diabetic retinopathy; NPDR non-proliferative diabetic retinopathy; PDR proliferative  diabetic retinopathy; CSME clinically significant macular edema; DME diabetic macular edema; dbh dot blot hemorrhages; CWS cotton wool spot; POAG primary open angle glaucoma; C/D cup-to-disc ratio; HVF humphrey visual field; GVF goldmann visual field; OCT optical coherence tomography; IOP intraocular pressure; BRVO Branch retinal vein occlusion; CRVO central retinal vein occlusion; CRAO central retinal artery occlusion; BRAO branch retinal artery occlusion; RT retinal tear; SB scleral buckle; PPV pars plana vitrectomy; VH Vitreous hemorrhage; PRP panretinal laser photocoagulation; IVK intravitreal kenalog; VMT vitreomacular traction; MH Macular hole;  NVD neovascularization of the disc; NVE neovascularization elsewhere; AREDS age related eye disease study; ARMD age related macular degeneration; POAG primary open angle glaucoma; EBMD epithelial/anterior basement membrane dystrophy; ACIOL anterior chamber intraocular lens; IOL intraocular lens; PCIOL posterior chamber intraocular lens; Phaco/IOL phacoemulsification with intraocular lens placement; [place] photorefractive keratectomy; LASIK laser assisted in situ keratomileusis; HTN hypertension; DM diabetes mellitus; COPD chronic obstructive pulmonary disease

## 2019-09-04 NOTE — Assessment & Plan Note (Signed)
Checking labs for control while in office.

## 2019-09-04 NOTE — Assessment & Plan Note (Signed)
Macular telangiectasia and CME and CSME all relatively stable although slightly worse only 3 weeks after injection of Eylea

## 2019-09-05 ENCOUNTER — Other Ambulatory Visit: Payer: Self-pay | Admitting: Internal Medicine

## 2019-09-05 ENCOUNTER — Encounter: Payer: Self-pay | Admitting: Internal Medicine

## 2019-09-05 DIAGNOSIS — R9431 Abnormal electrocardiogram [ECG] [EKG]: Secondary | ICD-10-CM

## 2019-09-05 LAB — HEMOGLOBIN A1C: Hgb A1c MFr Bld: 7.5 % — ABNORMAL HIGH (ref 4.6–6.5)

## 2019-09-14 ENCOUNTER — Encounter: Payer: Self-pay | Admitting: Internal Medicine

## 2019-09-20 ENCOUNTER — Other Ambulatory Visit: Payer: Self-pay

## 2019-09-20 ENCOUNTER — Ambulatory Visit: Payer: HMO | Admitting: Cardiology

## 2019-09-20 VITALS — BP 150/90 | HR 69 | Temp 94.1°F | Ht 61.0 in | Wt 163.2 lb

## 2019-09-20 DIAGNOSIS — E785 Hyperlipidemia, unspecified: Secondary | ICD-10-CM | POA: Diagnosis not present

## 2019-09-20 DIAGNOSIS — R9431 Abnormal electrocardiogram [ECG] [EKG]: Secondary | ICD-10-CM

## 2019-09-20 DIAGNOSIS — I10 Essential (primary) hypertension: Secondary | ICD-10-CM | POA: Diagnosis not present

## 2019-09-20 NOTE — Progress Notes (Signed)
Cardiology Office Note:    Date:  09/20/2019   ID:  Whitney [name], DOB September 23, 1934, MRN [id number]  PCP:  [name], MD  Cardiologist:  No primary care provider on file.  Electrophysiologist:  None   Referring MD: [name], *   Chief Complaint  Patient presents with  . Abnormal ECG    History of Present Illness:    Whitney [name] is a 85 y.o. female with a hx of T2DM, hypertension, hyperlipidemia, obesity who is referred by Dr. Sharlet Salina for evaluation of abnormal EKG. States that she had a recent episode of shoulder/back pain.  Cannot turn her head because of the pain.  She was prescribed a course of prednisone and pain resolved.  She denies any chest pain.  Reports she exercises by doing home exercise videos for 30 to 40 minutes.  She denies any exertional chest pain or dyspnea.  Denies any syncope or palpitations.  Does report occasional lightheadedness, typically with changing positions.  Also reports intermittent lower extremity edema.  Reports BP is usually under good control, typically in the 130s when she checks at home.  No smoking history.  No history of heart disease in her immediate family.  Past Medical History:  Diagnosis Date  . Anxiety   . Baker's cyst   . Chest pain   . Diabetes mellitus type 2, uncontrolled ([place])   . Diabetes mellitus without complication ([place])   . Disc disease, degenerative, cervical   . Diverticulosis of colon   . DJD (degenerative joint disease) of knee   . Facial pain, atypical   . GERD (gastroesophageal reflux disease)   . Glaucoma   . Hypercholesteremia   . Hypertension   . Lumbar spondylosis   . Obesity   . Onychomycosis   . Osteopenia     Past Surgical History:  Procedure Laterality Date  . right eye surgery  09/2010   retina in right eye  . VAGINAL HYSTERECTOMY  2002   Dr. Kem Kays repair    Current Medications: Current Meds  Medication Sig  . aspirin 81 MG tablet Take 1 tablet (81 mg total)  by mouth daily.  . blood glucose meter kit and supplies KIT Use to test blood sugar up to 3 times daily. DX E11.9  . Calcium Carb-Cholecalciferol (CALCIUM-VITAMIN D) 500-200 MG-UNIT tablet Take 1 tablet by mouth 2 (two) times daily with a meal.  . diclofenac Sodium (VOLTAREN) 1 % GEL Apply 4 g topically 4 (four) times daily.  . dorzolamide-timolol (COSOPT) 22.3-6.8 MG/ML ophthalmic solution Instill 1 drop in each eye twice a day  . DUREZOL 0.05 % EMUL   . glimepiride (AMARYL) 4 MG tablet TAKE 1 TABLET EVERY DAY BEFORE BREAKFAST  . Lancet Devices (ACCU-CHEK SOFTCLIX) lancets Use to test blood sugar up to 3 times a day. DX E11.9  . lisinopril-hydrochlorothiazide (ZESTORETIC) 20-12.5 MG tablet Take 1 tablet by mouth daily.  . metFORMIN (GLUCOPHAGE) 1000 MG tablet Take 1 tablet (1,000 mg total) by mouth 2 (two) times daily with a meal.  . Multiple Vitamin (MULTI VITAMIN DAILY PO) Take one by mouth daily  . ONETOUCH VERIO test strip Use to test blood sugar 3 times a day  . pioglitazone (ACTOS) 30 MG tablet Take 1 tablet (30 mg total) by mouth daily.  . predniSONE (DELTASONE) 20 MG tablet Take 2 tablets (40 mg total) by mouth daily with breakfast.  . simvastatin (ZOCOR) 20 MG tablet Take 1 tablet (20 mg total) by mouth at  bedtime.     Allergies:   Patient has no known allergies.   Social History   Socioeconomic History  . Marital status: Widowed    Spouse name: deceased 10/26/2008  . Number of children: Not on file  . Years of education: Not on file  . Highest education level: Not on file  Occupational History  . Not on file  Tobacco Use  . Smoking status: Never Smoker  . Smokeless tobacco: Never Used  Vaping Use  . Vaping Use: Never used  Substance and Sexual Activity  . Alcohol use: No  . Drug use: No  . Sexual activity: Never  Other Topics Concern  . Not on file  Social History Narrative   Lives alone   Spouse passed away 28-Jun-2008   Son;    3 girls   Clinical cytogeneticist;    Social Determinants  of Health   Financial Resource Strain:   . Difficulty of Paying Living Expenses:   Food Insecurity:   . Worried About Charity fundraiser in the Last Year:   . Arboriculturist in the Last Year:   Transportation Needs:   . Film/video editor (Medical):   Marland Kitchen Lack of Transportation (Non-Medical):   Physical Activity:   . Days of Exercise per Week:   . Minutes of Exercise per Session:   Stress:   . Feeling of Stress :   Social Connections:   . Frequency of Communication with Friends and Family:   . Frequency of Social Gatherings with Friends and Family:   . Attends Religious Services:   . Active Member of Clubs or Organizations:   . Attends Archivist Meetings:   Marland Kitchen Marital Status:      Family History: The patient's family history includes Diabetes in her mother. There is no history of Colon cancer or Stomach cancer.  ROS:   Please see the history of present illness.     All other systems reviewed and are negative.  EKGs/Labs/Other Studies Reviewed:    The following studies were reviewed today:   EKG:  EKG is ordered today.  The ekg ordered today demonstrates normal sinus rhythm, incomplete right bundle branch block, nonspecific T wave flattening  Recent Labs: 09/04/2019: ALT 16; BUN 11; Creatinine, Ser 0.77; Hemoglobin 12.5; Platelets 199.0; Potassium 3.4; Pro B Natriuretic peptide (BNP) 33.0; Sodium 138  Recent Lipid Panel    Component Value Date/Time   CHOL 139 09/04/2019 1440   TRIG 111.0 09/04/2019 1440   HDL 40.90 09/04/2019 1440   CHOLHDL 3 09/04/2019 1440   VLDL 22.2 09/04/2019 1440   LDLCALC 76 09/04/2019 1440    Physical Exam:    VS:  BP (!) 150/90   Pulse 69   Temp (!) 94.1 F (34.5 C)   Ht _0  (1.549 m)   Wt 163 lb 3.2 oz (74 kg)   SpO2 98%   BMI 30.84 kg/m     Wt Readings from Last 3 Encounters:  09/20/19 163 lb 3.2 oz (74 kg)  09/04/19 163 lb 12.8 oz (74.3 kg)  05/15/19 169 lb 6.4 oz (76.8 kg)     GEN:  in no acute  distress HEENT: Normal NECK: No JVD; No carotid bruits CARDIAC: RRR, no murmurs, rubs, gallops RESPIRATORY:  Clear to auscultation without rales, wheezing or rhonchi  ABDOMEN: Soft, non-tender, non-distended MUSCULOSKELETAL:  No edema; No deformity  SKIN: Warm and dry NEUROLOGIC:  Alert and oriented x 3 PSYCHIATRIC:  Normal affect   ASSESSMENT:  1. Abnormal EKG   2. Essential hypertension   3. Hyperlipidemia, unspecified hyperlipidemia type    PLAN:    Abnormal EKG: incomplete RBBB, will check TTE to evaluate for structural heart disease  Hypertension: On lisinopril-hydrochlorothiazide 20-12.5 mg.  BP elevated in clinic, but reports is usually under good control.  Asked patient to check BP daily for next 2 weeks and call with results.  T2DM: On Metformin, glimepiride, Actos.  A1c 7.5 on 09/04/2019.  Hyperlipidemia: On simvastatin 20 mg daily. LDL 76 on 09/04/2019.  RTC in 3 months   Medication Adjustments/Labs and Tests Ordered: Current medicines are reviewed at length with the patient today.  Concerns regarding medicines are outlined above.  Orders Placed This Encounter  Procedures  . EKG 12-Lead  . ECHOCARDIOGRAM COMPLETE   No orders of the defined types were placed in this encounter.   Patient Instructions  Medication Instructions:  Your physician recommends that you continue on your current medications as directed. Please refer to the Current Medication list given to you today.  Testing/Procedures: Your physician has requested that you have an echocardiogram. Echocardiography is a painless test that uses sound waves to create images of your heart. It provides your doctor with information about the size and shape of your heart and how well your heart's chambers and valves are working. This procedure takes approximately one hour. There are no restrictions for this procedure. This will be done at our [facility] location:  [place]: At [facility], you and your health needs are our priority.  As part of our continuing mission to provide you with exceptional heart care, we have created designated Provider Care Teams.  These Care Teams include your primary Cardiologist (physician) and Advanced Practice Providers (APPs -  Physician Assistants and Nurse Practitioners) who all work together to provide you with the care you need, when you need it.  We recommend signing up for the patient portal called "MyChart".  Sign up information is provided on this After Visit Summary.  MyChart is used to connect with patients for Virtual Visits (Telemedicine).  Patients are able to view lab/test results, encounter notes, upcoming appointments, etc.  Non-urgent messages can be sent to your provider as well.   To learn more about what you can do with MyChart, go to [email or web address].    Your next appointment:   3 month(s)  The format for your next appointment:   In Person  Provider:   Oswaldo Milian, MD        Signed, Donato Heinz, MD  09/20/2019 6:12 PM    [facility]

## 2019-09-20 NOTE — Patient Instructions (Signed)
Medication Instructions:  Your physician recommends that you continue on your current medications as directed. Please refer to the Current Medication list given to you today.  Testing/Procedures: Your physician has requested that you have an echocardiogram. Echocardiography is a painless test that uses sound waves to create images of your heart. It provides your doctor with information about the size and shape of your heart and how well your heart's chambers and valves are working. This procedure takes approximately one hour. There are no restrictions for this procedure. This will be done at our [address] location:  [address]  Follow-Up: At [facility], you and your health needs are our priority.  As part of our continuing mission to provide you with exceptional heart care, we have created designated Provider Care Teams.  These Care Teams include your primary Cardiologist (physician) and Advanced Practice Providers (APPs -  Physician Assistants and Nurse Practitioners) who all work together to provide you with the care you need, when you need it.  We recommend signing up for the patient portal called "MyChart".  Sign up information is provided on this After Visit Summary.  MyChart is used to connect with patients for Virtual Visits (Telemedicine).  Patients are able to view lab/test results, encounter notes, upcoming appointments, etc.  Non-urgent messages can be sent to your provider as well.   To learn more about what you can do with MyChart, go to [email or web address].    Your next appointment:   3 month(s)  The format for your next appointment:   In Person  Provider:   Christopher Schumann, MD   

## 2019-09-28 ENCOUNTER — Other Ambulatory Visit: Payer: Self-pay

## 2019-09-28 ENCOUNTER — Encounter: Payer: Self-pay | Admitting: Internal Medicine

## 2019-09-28 ENCOUNTER — Ambulatory Visit (INDEPENDENT_AMBULATORY_CARE_PROVIDER_SITE_OTHER): Payer: HMO | Admitting: Internal Medicine

## 2019-09-28 VITALS — BP 124/80 | HR 67 | Temp 98.4°F | Ht 61.0 in | Wt 162.0 lb

## 2019-09-28 DIAGNOSIS — E785 Hyperlipidemia, unspecified: Secondary | ICD-10-CM

## 2019-09-28 DIAGNOSIS — E1169 Type 2 diabetes mellitus with other specified complication: Secondary | ICD-10-CM | POA: Diagnosis not present

## 2019-09-28 MED ORDER — EMPAGLIFLOZIN 25 MG PO TABS
25.0000 mg | ORAL_TABLET | Freq: Every day | ORAL | 3 refills | Status: DC
Start: 2019-09-28 — End: 2020-01-04

## 2019-09-28 NOTE — Progress Notes (Signed)
   Subjective:   Patient ID: Whitney [name], female    DOB: August 07, 1934, 85 y.o.   MRN: [id number]  HPI The patient is an 85 YO female coming in for concerns about her blood sugar. Her fasting sugar is too high for her at 188 typically around there. She denies change in diet or exercise recently. Chest pains are now resolved, has seen cardiology and getting TTE in the near future. Denies new numbness or tingling in feet.   Review of Systems  Constitutional: Negative.   HENT: Negative.   Eyes: Negative.   Respiratory: Negative for cough, chest tightness and shortness of breath.   Cardiovascular: Negative for chest pain, palpitations and leg swelling.  Gastrointestinal: Negative for abdominal distention, abdominal pain, constipation, diarrhea, nausea and vomiting.  Musculoskeletal: Negative.   Skin: Negative.   Neurological: Negative.   Psychiatric/Behavioral: Negative.     Objective:  Physical Exam Constitutional:      Appearance: She is well-developed.  HENT:     Head: Normocephalic and atraumatic.  Cardiovascular:     Rate and Rhythm: Normal rate and regular rhythm.  Pulmonary:     Effort: Pulmonary effort is normal. No respiratory distress.     Breath sounds: Normal breath sounds. No wheezing or rales.  Abdominal:     General: Bowel sounds are normal. There is no distension.     Palpations: Abdomen is soft.     Tenderness: There is no abdominal tenderness. There is no rebound.  Musculoskeletal:     Cervical back: Normal range of motion.  Skin:    General: Skin is warm and dry.  Neurological:     Mental Status: She is alert and oriented to person, place, and time.     Coordination: Coordination normal.     Vitals:   09/28/19 0831  BP: 124/80  Pulse: 67  Temp: 98.4 F (36.9 C)  TempSrc: Oral  SpO2: 96%  Weight: 162 lb (73.5 kg)  Height: 5\' 1"  (1.549 m)   This visit occurred during the SARS-CoV-2 public health emergency.  Safety protocols were in place,  including screening questions prior to the visit, additional usage of staff PPE, and extensive cleaning of exam room while observing appropriate contact time as indicated for disinfecting solutions.   Assessment & Plan:

## 2019-09-28 NOTE — Assessment & Plan Note (Signed)
Change actos to jardiance for better control. She is concerned about her morning sugars although HgA1c is most recently at goal at 7.5. Continue metformin and amaryl at current dosing.

## 2019-09-28 NOTE — Patient Instructions (Addendum)
We will have you stop taking actos (pioglitazone) and start a medicine called jardiance instead. This is 1 pill daily.   Come back in about 3-6 months to check on the sugar levels.

## 2019-10-08 ENCOUNTER — Other Ambulatory Visit: Payer: Self-pay

## 2019-10-08 NOTE — Patient Outreach (Addendum)
[place] [facility]) Care Management Chronic Special Needs Program  10/08/2019  Name: Whitney [name] DOB: 07-Dec-1934  MRN: [id number]  Ms. Whitney [name] is enrolled in a chronic special needs plan for Diabetes. Chronic Care Management Coordinator telephoned client to follow up and update individualized care plan. RNCM reviewed the chronic care management program, importance of client participation, and taking their care plan to all provider appointments and inpatient facilities.   Subjective: client reports she has been following up with provider. Last office visit 09/28/2019. She reports she has not checked blood sugars, but states her morning blood sugar have been a little high for her. She reports primary care has changed medications at her last visit this month. Client marked memory loss on health risk assessment, however she reports it is like recalling someone's name. She states later it returns to her. she reports no other issues with memory. Client states she will discuss with her primary care provider. Client denies any other questions or concerns. No additional care management needs noted at this time.  Goals Addressed              This Visit's Progress   .  <enter goal here> (pt-stated)   On track     "get my A1C down" by continuing to exercise and watching carb intake.   Client reports she continues to exercise 30 minutes 5 days/week exercise classes provided on Television per [facility].    .  Client will verbalize knowledge of self management of Hypertension as evidences by BP reading of 140/90 or less; or as defined by provider   On track     Plan to check your blood pressure regularly. If you do not have one, check for one in your over the counter (OTC) catalog. If you have additonal questions contact your health care concierge. Take medications as prescribed. Monitor your salt intake. Emmi education provided, "diabetes and blood pressure". Please review and  plan to review at next telephonic assessment.     Marland Kitchen  HEMOGLOBIN A1C < 7.5 (pt-stated)        Per chart: A1C goal is 7.5 Diabetes self management actions:  Glucose monitoring per provider recommendations  Eat Healthy-Plan to eat low carbohydrate and low salt meals, watch portion sizes and avoid sugar sweetened drinks.   Visit provider every 3-6 months as directed  Hbg A1C level every 3-6 months. Take your medications as prescribed. Attend provider visits as scheduled. Discussed low carbohydrate meal plan. Emmi education provided, " diabetic meal planning. Please review and call if you have any questions. Plan to discuss at next telephonic assessment.    .  COMPLETED: Obtain annual  Lipid Profile, LDL-C        Last eye exam 09/04/2019    .  COMPLETED: Obtain Annual Eye (retinal)  Exam         Done 09/04/2019.    .  Obtain Annual Foot Exam   On track     Last done 03/30/2019. Check your feet and in-between toes daily for cuts, bruises, redness, blisters or sores. If you cant' reach them use a mirror. Wear shoes that are not too tight and don't walk barefoot.       .  Obtain annual screen for micro albuminuria (urine) , nephropathy (kidney problems)   On track     Renewed 2021: It is important for your doctor to check your urine at least once a year. Thest test show how your kidneys are working. Emmi  education provided, "urine albumin test". Please review and plan to discuss at next telephonic assessment.     .  Obtain Hemoglobin A1C at least 2 times per year   On track     A1C 09/29/19 7.5; A1C 03/30/2019 7.2; A1C 09/29/2018 7.4  Goal continues 2021: Continue to keep your follow up appointments with your provider. Discussed upcoming appointments. Provided information A1C. Review and call if you have any questions.     .  Patient Stated   On track     Work toward lowering my HGB. A1c by monitoring my diet closely and perhaps going to [facility] education classes. Discussed  [place] [facility] Diabetes Education classes-you reported attending one class then classes stopped due to pandemic. RNCM Provided contact number to [place] and Diabetes Education [phone]) to follow up.    .  COMPLETED: Visit Primary Care Provider or Endocrinologist at least 2 times per year          Primary care on 09/28/2019; 09/04/2019; 03/29/2001 and 09/29/2018      Client reports she is fully vaccinated. Reinforced the availability of the 24 hour nurse advice line and health care concierge for benefit questions. Client encouraged to call RNCM as needed.  Plan: RNCM will send updated care plan to client; send updated care plan to primary care provider, send education. Chronic care management coordination will outreach per tier level within 9-12 months.   Thea Silversmith, RN, MSN, [place] [phone]

## 2019-10-09 ENCOUNTER — Other Ambulatory Visit: Payer: Self-pay

## 2019-10-09 ENCOUNTER — Ambulatory Visit (INDEPENDENT_AMBULATORY_CARE_PROVIDER_SITE_OTHER): Payer: HMO | Admitting: Ophthalmology

## 2019-10-09 ENCOUNTER — Encounter (INDEPENDENT_AMBULATORY_CARE_PROVIDER_SITE_OTHER): Payer: Self-pay | Admitting: Ophthalmology

## 2019-10-09 DIAGNOSIS — E113311 Type 2 diabetes mellitus with moderate nonproliferative diabetic retinopathy with macular edema, right eye: Secondary | ICD-10-CM | POA: Diagnosis not present

## 2019-10-09 MED ORDER — BEVACIZUMAB CHEMO INJECTION 1.25MG/0.05ML SYRINGE FOR KALEIDOSCOPE
1.2500 mg | INTRAVITREAL | Status: AC | PRN
  Administered 2019-10-09: 1.25 mg via INTRAVITREAL

## 2019-10-09 NOTE — Progress Notes (Signed)
10/09/2019     CHIEF COMPLAINT Patient presents for Retina Follow Up   HISTORY OF PRESENT ILLNESS: Whitney [name] is a 85 y.o. female who presents to the clinic today for:   HPI    Retina Follow Up    Patient presents with  Diabetic Retinopathy.  In right eye.  Duration of 8 weeks.  Since onset it is stable.          Comments    8 week follow up - OCT OU, Poss Avastin OD Patient denies change in vision and overall has no complaints. LBS 180 /// A1C 7.5        Last edited by Gerda Diss on 10/09/2019  8:48 AM. (History)      Referring physician: [name], MD [place],   [id number]  HISTORICAL INFORMATION:   Selected notes from the MEDICAL RECORD NUMBER    Lab Results  Component Value Date   HGBA1C 7.5 (H) 09/04/2019     CURRENT MEDICATIONS: Current Outpatient Medications (Ophthalmic Drugs)  Medication Sig  . dorzolamide-timolol (COSOPT) 22.3-6.8 MG/ML ophthalmic solution Instill 1 drop in each eye twice a day  . DUREZOL 0.05 % EMUL    No current facility-administered medications for this visit. (Ophthalmic Drugs)   Current Outpatient Medications (Other)  Medication Sig  . aspirin 81 MG tablet Take 1 tablet (81 mg total) by mouth daily.  . blood glucose meter kit and supplies KIT Use to test blood sugar up to 3 times daily. DX E11.9  . Calcium Carb-Cholecalciferol (CALCIUM-VITAMIN D) 500-200 MG-UNIT tablet Take 1 tablet by mouth 2 (two) times daily with a meal.  . diclofenac Sodium (VOLTAREN) 1 % GEL Apply 4 g topically 4 (four) times daily.  . empagliflozin (JARDIANCE) 25 MG TABS tablet Take 1 tablet (25 mg total) by mouth daily before breakfast.  . glimepiride (AMARYL) 4 MG tablet TAKE 1 TABLET EVERY DAY BEFORE BREAKFAST  . Lancet Devices (ACCU-CHEK SOFTCLIX) lancets Use to test blood sugar up to 3 times a day. DX E11.9  . lisinopril-hydrochlorothiazide (ZESTORETIC) 20-12.5 MG tablet Take 1 tablet by mouth daily.  .  metFORMIN (GLUCOPHAGE) 1000 MG tablet Take 1 tablet (1,000 mg total) by mouth 2 (two) times daily with a meal.  . Multiple Vitamin (MULTI VITAMIN DAILY PO) Take one by mouth daily  . ONETOUCH VERIO test strip Use to test blood sugar 3 times a day  . pioglitazone (ACTOS) 30 MG tablet Take 1 tablet (30 mg total) by mouth daily.  . simvastatin (ZOCOR) 20 MG tablet Take 1 tablet (20 mg total) by mouth at bedtime.   No current facility-administered medications for this visit. (Other)      REVIEW OF SYSTEMS:    ALLERGIES No Known Allergies  PAST MEDICAL HISTORY Past Medical History:  Diagnosis Date  . Anxiety   . Baker's cyst   . Chest pain   . Diabetes mellitus type 2, uncontrolled ([place])   . Diabetes mellitus without complication ([place])   . Disc disease, degenerative, cervical   . Diverticulosis of colon   . DJD (degenerative joint disease) of knee   . Facial pain, atypical   . GERD (gastroesophageal reflux disease)   . Glaucoma   . Hypercholesteremia   . Hypertension   . Lumbar spondylosis   . Obesity   . Onychomycosis   . Osteopenia    Past Surgical History:  Procedure Laterality Date  . right eye surgery  09/2010  retina in right eye  . VAGINAL HYSTERECTOMY  2002   Dr. Kem Kays repair    FAMILY HISTORY Family History  Problem Relation Age of Onset  . Diabetes Mother   . Colon cancer Neg Hx   . Stomach cancer Neg Hx     SOCIAL HISTORY Social History   Tobacco Use  . Smoking status: Never Smoker  . Smokeless tobacco: Never Used  Vaping Use  . Vaping Use: Never used  Substance Use Topics  . Alcohol use: No  . Drug use: No         OPHTHALMIC EXAM:  Base Eye Exam    Visual Acuity (Snellen - Linear)      Right Left   Dist cc 20/25-1 20/25-1       Tonometry (Tonopen, 8:52 AM)      Right Left   Pressure 17 14       Pupils      Pupils Dark Light Shape React APD   Right PERRL 4 3 Irregular Brisk None   Left PERRL 4 3 Irregular Brisk Trace        Visual Fields (Counting fingers)      Left Right    Full Full       Extraocular Movement      Right Left    Full Full       Neuro/Psych    Oriented x3: Yes   Mood/Affect: Normal       Dilation    Right eye: 1.0% Mydriacyl, 2.5% Phenylephrine @ 8:52 AM        Slit Lamp and Fundus Exam    External Exam      Right Left   External Normal Normal       Slit Lamp Exam      Right Left   Lids/Lashes Normal Normal   Conjunctiva/Sclera White and quiet White and quiet   Cornea Clear Clear   Anterior Chamber Deep and quiet Deep and quiet   Iris Round and reactive Round and reactive   Lens Posterior chamber intraocular lens Posterior chamber intraocular lens   Anterior Vitreous Normal Normal       Fundus Exam      Right Left   Posterior Vitreous Normal    Disc Normal    C/D Ratio 0.55    Macula Microaneurysms,, no clinical CSME    Vessels NPDR, moderate    Periphery Normal           IMAGING AND PROCEDURES  Imaging and Procedures for 10/09/19  OCT, Retina - OU - Both Eyes       Right Eye Quality was good. Scan locations included subfoveal. Central Foveal Thickness: 248. Progression has improved.   Left Eye Quality was good. Scan locations included subfoveal. Central Foveal Thickness: 237.   Notes OD, minor CSME improved, slight residual superior and inferior to the FAZ at 8-week interval.  We will repeat intravitreal Avastin today  OS also improved CSME with only minor leakage temporally.  Follow-up as scheduled scheduled       Intravitreal Injection, Pharmacologic Agent - OD - Right Eye       Time Out 10/09/2019. 10:26 AM. Confirmed correct patient, procedure, site, and patient consented.   Anesthesia Topical anesthesia was used. Anesthetic medications included Akten 3.5%.   Procedure Preparation included Ofloxacin , 10% betadine to eyelids, 5% betadine to ocular surface. A 30 gauge needle was used.   Injection:  1.25 mg Bevacizumab (AVASTIN)  SOLN   NDC:  [phone], Lot: [id number]   Route: Intravitreal, Site: Right Eye, Waste: 0 mg  Post-op Post injection exam found visual acuity of at least counting fingers. The patient tolerated the procedure well. There were no complications. The patient received written and verbal post procedure care education. Post injection medications were not given.                 ASSESSMENT/PLAN:  Moderate nonproliferative diabetic retinopathy of right eye with macular edema (HCC) CSME OD much improved and now more responsive to antiveg F therapy now that patient continues to persist on CPAP use for OSA.      ICD-10-CM   1. Moderate nonproliferative diabetic retinopathy of right eye with macular edema associated with type 2 diabetes mellitus (HCC)  E11.3311 OCT, Retina - OU - Both Eyes    Intravitreal Injection, Pharmacologic Agent - OD - Right Eye    Bevacizumab (AVASTIN) SOLN 1.25 mg    1.  2.  3.  Ophthalmic Meds Ordered this visit:  Meds ordered this encounter  Medications  . Bevacizumab (AVASTIN) SOLN 1.25 mg       Return in about 10 weeks (around 12/18/2019) for OD, dilate, AVASTIN OCT.  There are no Patient Instructions on file for this visit.   Explained the diagnoses, plan, and follow up with the patient and they expressed understanding.  Patient expressed understanding of the importance of proper follow up care.   Clent Demark Evaleen Sant M.D. Diseases & Surgery of the Retina and Vitreous Retina & Diabetic [place] 10/09/19     Abbreviations: M myopia (nearsighted); A astigmatism; H hyperopia (farsighted); P presbyopia; Mrx spectacle prescription;  CTL contact lenses; OD right eye; OS left eye; OU both eyes  XT exotropia; ET esotropia; PEK punctate epithelial keratitis; PEE punctate epithelial erosions; DES dry eye syndrome; MGD meibomian gland dysfunction; ATs artificial tears; PFAT's preservative free artificial tears; [place] nuclear sclerotic cataract; PSC posterior  subcapsular cataract; ERM epi-retinal membrane; PVD posterior vitreous detachment; RD retinal detachment; DM diabetes mellitus; DR diabetic retinopathy; NPDR non-proliferative diabetic retinopathy; PDR proliferative diabetic retinopathy; CSME clinically significant macular edema; DME diabetic macular edema; dbh dot blot hemorrhages; CWS cotton wool spot; POAG primary open angle glaucoma; C/D cup-to-disc ratio; HVF humphrey visual field; GVF goldmann visual field; OCT optical coherence tomography; IOP intraocular pressure; BRVO Branch retinal vein occlusion; CRVO central retinal vein occlusion; CRAO central retinal artery occlusion; BRAO branch retinal artery occlusion; RT retinal tear; SB scleral buckle; PPV pars plana vitrectomy; VH Vitreous hemorrhage; PRP panretinal laser photocoagulation; IVK intravitreal kenalog; VMT vitreomacular traction; MH Macular hole;  NVD neovascularization of the disc; NVE neovascularization elsewhere; AREDS age related eye disease study; ARMD age related macular degeneration; POAG primary open angle glaucoma; EBMD epithelial/anterior basement membrane dystrophy; ACIOL anterior chamber intraocular lens; IOL intraocular lens; PCIOL posterior chamber intraocular lens; Phaco/IOL phacoemulsification with intraocular lens placement; [place] photorefractive keratectomy; LASIK laser assisted in situ keratomileusis; HTN hypertension; DM diabetes mellitus; COPD chronic obstructive pulmonary disease

## 2019-10-09 NOTE — Assessment & Plan Note (Signed)
CSME OD much improved and now more responsive to antiveg F therapy now that patient continues to persist on CPAP use for OSA.

## 2019-10-16 ENCOUNTER — Ambulatory Visit (HOSPITAL_COMMUNITY): Payer: HMO | Attending: Internal Medicine

## 2019-10-16 ENCOUNTER — Other Ambulatory Visit: Payer: Self-pay

## 2019-10-16 DIAGNOSIS — R9431 Abnormal electrocardiogram [ECG] [EKG]: Secondary | ICD-10-CM | POA: Diagnosis not present

## 2019-10-26 DIAGNOSIS — G4733 Obstructive sleep apnea (adult) (pediatric): Secondary | ICD-10-CM | POA: Diagnosis not present

## 2019-10-30 ENCOUNTER — Encounter (INDEPENDENT_AMBULATORY_CARE_PROVIDER_SITE_OTHER): Payer: Self-pay | Admitting: Ophthalmology

## 2019-10-30 ENCOUNTER — Other Ambulatory Visit: Payer: Self-pay

## 2019-10-30 ENCOUNTER — Ambulatory Visit (INDEPENDENT_AMBULATORY_CARE_PROVIDER_SITE_OTHER): Payer: HMO | Admitting: Ophthalmology

## 2019-10-30 DIAGNOSIS — E113312 Type 2 diabetes mellitus with moderate nonproliferative diabetic retinopathy with macular edema, left eye: Secondary | ICD-10-CM | POA: Diagnosis not present

## 2019-10-30 MED ORDER — AFLIBERCEPT 2MG/0.05ML IZ SOLN FOR KALEIDOSCOPE
2.0000 mg | INTRAVITREAL | Status: AC | PRN
  Administered 2019-10-30: 2 mg via INTRAVITREAL

## 2019-10-30 NOTE — Assessment & Plan Note (Signed)
Overall CSME center involved macular edema has improved and resolved with only residual CSME temporally, controlled on intravitreal Eylea at 8-week interval of examination, repeat today injection OS

## 2019-10-30 NOTE — Progress Notes (Signed)
10/30/2019     CHIEF COMPLAINT Patient presents for Retina Follow Up   HISTORY OF PRESENT ILLNESS: Whitney [name] is a 85 y.o. female who presents to the clinic today for:   HPI    Retina Follow Up    Patient presents with  Diabetic Retinopathy.  In left eye.  This started 8 weeks ago.  Duration of 8 weeks.  Since onset it is stable.          Comments    8 week f/u dilated exam, OCT(MAC) with possible Eylea OS today.  Pt denies noticeable changes to [place] OU since last visit. Pt denies ocular pain, flashes of light, or floaters OU.  LBS was 190 yesterday       Last edited by Melburn Popper, COA on 10/30/2019  9:13 AM. (History)      Referring physician: [name], MD [place],  [place] [id number]  HISTORICAL INFORMATION:   Selected notes from the MEDICAL RECORD NUMBER    Lab Results  Component Value Date   HGBA1C 7.5 (H) 09/04/2019     CURRENT MEDICATIONS: Current Outpatient Medications (Ophthalmic Drugs)  Medication Sig  . dorzolamide-timolol (COSOPT) 22.3-6.8 MG/ML ophthalmic solution Instill 1 drop in each eye twice a day  . DUREZOL 0.05 % EMUL    No current facility-administered medications for this visit. (Ophthalmic Drugs)   Current Outpatient Medications (Other)  Medication Sig  . aspirin 81 MG tablet Take 1 tablet (81 mg total) by mouth daily.  . blood glucose meter kit and supplies KIT Use to test blood sugar up to 3 times daily. DX E11.9  . Calcium Carb-Cholecalciferol (CALCIUM-VITAMIN D) 500-200 MG-UNIT tablet Take 1 tablet by mouth 2 (two) times daily with a meal.  . diclofenac Sodium (VOLTAREN) 1 % GEL Apply 4 g topically 4 (four) times daily.  . empagliflozin (JARDIANCE) 25 MG TABS tablet Take 1 tablet (25 mg total) by mouth daily before breakfast.  . glimepiride (AMARYL) 4 MG tablet TAKE 1 TABLET EVERY DAY BEFORE BREAKFAST  . Lancet Devices (ACCU-CHEK SOFTCLIX) lancets Use to test blood sugar up to 3 times a day. DX  E11.9  . lisinopril-hydrochlorothiazide (ZESTORETIC) 20-12.5 MG tablet Take 1 tablet by mouth daily.  . metFORMIN (GLUCOPHAGE) 1000 MG tablet Take 1 tablet (1,000 mg total) by mouth 2 (two) times daily with a meal.  . Multiple Vitamin (MULTI VITAMIN DAILY PO) Take one by mouth daily  . ONETOUCH VERIO test strip Use to test blood sugar 3 times a day  . pioglitazone (ACTOS) 30 MG tablet Take 1 tablet (30 mg total) by mouth daily.  . simvastatin (ZOCOR) 20 MG tablet Take 1 tablet (20 mg total) by mouth at bedtime.   No current facility-administered medications for this visit. (Other)      REVIEW OF SYSTEMS:    ALLERGIES No Known Allergies  PAST MEDICAL HISTORY Past Medical History:  Diagnosis Date  . Anxiety   . Baker's cyst   . Chest pain   . Diabetes mellitus type 2, uncontrolled ([place])   . Diabetes mellitus without complication ([place])   . Disc disease, degenerative, cervical   . Diverticulosis of colon   . DJD (degenerative joint disease) of knee   . Facial pain, atypical   . GERD (gastroesophageal reflux disease)   . Glaucoma   . Hypercholesteremia   . Hypertension   . Lumbar spondylosis   . Obesity   . Onychomycosis   . Osteopenia  Past Surgical History:  Procedure Laterality Date  . right eye surgery  09/2010   retina in right eye  . VAGINAL HYSTERECTOMY  2002   Dr. Kem Kays repair    FAMILY HISTORY Family History  Problem Relation Age of Onset  . Diabetes Mother   . Colon cancer Neg Hx   . Stomach cancer Neg Hx     SOCIAL HISTORY Social History   Tobacco Use  . Smoking status: Never Smoker  . Smokeless tobacco: Never Used  Vaping Use  . Vaping Use: Never used  Substance Use Topics  . Alcohol use: No  . Drug use: No         OPHTHALMIC EXAM:  Base Eye Exam    Visual Acuity (ETDRS)      Right Left   Dist cc 20/25 20/30 -2   Correction: Glasses       Tonometry (Tonopen, 9:17 AM)      Right Left   Pressure 13 15       Pupils       Pupils Dark Light Shape React APD   Right PERRL 4 3 Irregular Brisk None   Left PERRL 4 3 Irregular Brisk None       Visual Fields (Counting fingers)      Left Right    Full Full       Extraocular Movement      Right Left    Full Full       Neuro/Psych    Oriented x3: Yes   Mood/Affect: Normal       Dilation    Left eye: 1.0% Mydriacyl, 2.5% Phenylephrine @ 9:17 AM        Slit Lamp and Fundus Exam    External Exam      Right Left   External Normal Normal       Slit Lamp Exam      Right Left   Lids/Lashes Normal Normal   Conjunctiva/Sclera White and quiet White and quiet   Cornea Clear Clear   Anterior Chamber Deep and quiet Deep and quiet   Iris Round and reactive Round and reactive   Lens Posterior chamber intraocular lens Posterior chamber intraocular lens   Anterior Vitreous Normal Normal       Fundus Exam      Right Left   Posterior Vitreous Normal Normal   Disc Normal Normal   C/D Ratio  0.7   Macula  Microaneurysms, Macular thickening, Cystoid macular edema, Mild clinically significant macular edema   Vessels  NPDR, moderate   Periphery Normal Normal          IMAGING AND PROCEDURES  Imaging and Procedures for 10/30/19  OCT, Retina - OU - Both Eyes       Right Eye Quality was good. Scan locations included subfoveal. Central Foveal Thickness: 236. Progression has improved.   Left Eye Quality was good. Scan locations included subfoveal. Central Foveal Thickness: 243. Progression has been stable. Findings include cystoid macular edema.   Notes OD, currently at 3 weeks status post recent examination and injection, the region superior to the fovea improved on therapy.  OS, perifoveal CME temporally, stable overall and improved now the patient on CPAP yet still active CSME temporally will repeat today injection Eylea       Intravitreal Injection, Pharmacologic Agent - OS - Left Eye       Time Out 10/30/2019. 10:19 AM. Confirmed correct  patient, procedure, site, and patient consented.   Anesthesia Topical  anesthesia was used. Anesthetic medications included Akten 3.5%.   Procedure Preparation included Tobramycin 0.3%, 10% betadine to eyelids, 5% betadine to ocular surface. A 30 gauge needle was used.   Injection:  2 mg aflibercept Alfonse Flavors) SOLN   NDC: [id number], Lot: [id number]   Route: Intravitreal, Site: Left Eye, Waste: 0 mg  Post-op Post injection exam found visual acuity of at least counting fingers. The patient tolerated the procedure well. There were no complications. The patient received written and verbal post procedure care education. Post injection medications were not given.                 ASSESSMENT/PLAN:  Moderate nonproliferative diabetic retinopathy of left eye with macular edema associated with type 2 diabetes mellitus (HCC) Overall CSME center involved macular edema has improved and resolved with only residual CSME temporally, controlled on intravitreal Eylea at 8-week interval of examination, repeat today injection OS      ICD-10-CM   1. Moderate nonproliferative diabetic retinopathy of left eye with macular edema associated with type 2 diabetes mellitus (HCC)  C14.4818 OCT, Retina - OU - Both Eyes    Intravitreal Injection, Pharmacologic Agent - OS - Left Eye    aflibercept (EYLEA) SOLN 2 mg    1.OS, perifoveal CME temporally, stable overall and improved now the patient on CPAP yet still active CSME temporally will repeat today injection Eylea, and repeat examination left eye in 8 weeks  2.  Follow-up OD as scheduled and injection as needed  3.  Ophthalmic Meds Ordered this visit:  Meds ordered this encounter  Medications  . aflibercept (EYLEA) SOLN 2 mg       Return in about 8 weeks (around 12/25/2019) for dilate, OS, EYLEA OCT.  There are no Patient Instructions on file for this visit.   Explained the diagnoses, plan, and follow up with the patient and they expressed  understanding.  Patient expressed understanding of the importance of proper follow up care.   Clent Demark Ishana Blades M.D. Diseases & Surgery of the Retina and Vitreous Retina & Diabetic [place] 10/30/19     Abbreviations: M myopia (nearsighted); A astigmatism; H hyperopia (farsighted); P presbyopia; Mrx spectacle prescription;  CTL contact lenses; OD right eye; OS left eye; OU both eyes  XT exotropia; ET esotropia; PEK punctate epithelial keratitis; PEE punctate epithelial erosions; DES dry eye syndrome; MGD meibomian gland dysfunction; ATs artificial tears; PFAT's preservative free artificial tears; [place] nuclear sclerotic cataract; PSC posterior subcapsular cataract; ERM epi-retinal membrane; PVD posterior vitreous detachment; RD retinal detachment; DM diabetes mellitus; DR diabetic retinopathy; NPDR non-proliferative diabetic retinopathy; PDR proliferative diabetic retinopathy; CSME clinically significant macular edema; DME diabetic macular edema; dbh dot blot hemorrhages; CWS cotton wool spot; POAG primary open angle glaucoma; C/D cup-to-disc ratio; HVF humphrey visual field; GVF goldmann visual field; OCT optical coherence tomography; IOP intraocular pressure; BRVO Branch retinal vein occlusion; CRVO central retinal vein occlusion; CRAO central retinal artery occlusion; BRAO branch retinal artery occlusion; RT retinal tear; SB scleral buckle; PPV pars plana vitrectomy; VH Vitreous hemorrhage; PRP panretinal laser photocoagulation; IVK intravitreal kenalog; VMT vitreomacular traction; MH Macular hole;  NVD neovascularization of the disc; NVE neovascularization elsewhere; AREDS age related eye disease study; ARMD age related macular degeneration; POAG primary open angle glaucoma; EBMD epithelial/anterior basement membrane dystrophy; ACIOL anterior chamber intraocular lens; IOL intraocular lens; PCIOL posterior chamber intraocular lens; Phaco/IOL phacoemulsification with intraocular lens placement; [place]  photorefractive keratectomy; LASIK laser assisted in situ keratomileusis; HTN hypertension; DM diabetes mellitus; COPD  chronic obstructive pulmonary disease

## 2019-11-13 ENCOUNTER — Ambulatory Visit: Payer: HMO

## 2019-12-06 DIAGNOSIS — H02423 Myogenic ptosis of bilateral eyelids: Secondary | ICD-10-CM | POA: Diagnosis not present

## 2019-12-06 DIAGNOSIS — H401131 Primary open-angle glaucoma, bilateral, mild stage: Secondary | ICD-10-CM | POA: Diagnosis not present

## 2019-12-06 DIAGNOSIS — E113313 Type 2 diabetes mellitus with moderate nonproliferative diabetic retinopathy with macular edema, bilateral: Secondary | ICD-10-CM | POA: Diagnosis not present

## 2019-12-06 DIAGNOSIS — Z961 Presence of intraocular lens: Secondary | ICD-10-CM | POA: Diagnosis not present

## 2019-12-12 DIAGNOSIS — Z01419 Encounter for gynecological examination (general) (routine) without abnormal findings: Secondary | ICD-10-CM | POA: Diagnosis not present

## 2019-12-12 DIAGNOSIS — B373 Candidiasis of vulva and vagina: Secondary | ICD-10-CM | POA: Diagnosis not present

## 2019-12-13 NOTE — Progress Notes (Signed)
Cardiology Office Note:    Date:  12/14/2019   ID:  JAYSHA LASURE, DOB 09-06-1934, MRN [id number]  PCP:  [name], MD  Cardiologist:  No primary care provider on file.  Electrophysiologist:  None   Referring MD: [name], *   Chief Complaint  Patient presents with  . Abnormal ECG    History of Present Illness:    Whitney [name] is a 85 y.o. female with a hx of T2DM, hypertension, hyperlipidemia, obesity who presents for follow-up.  She was referred by Dr. Sharlet Salina for evaluation of abnormal EKG, initially seen on 09/20/2019.  States that she had a recent episode of shoulder/back pain.  Cannot turn her head because of the pain.  She was prescribed a course of prednisone and pain resolved.  She denies any chest pain.  Reports she exercises by doing home exercise videos for 30 to 40 minutes.  She denies any exertional chest pain or dyspnea.  Denies any syncope or palpitations.  Does report occasional lightheadedness, typically with changing positions.  Also reports intermittent lower extremity edema.  Reports BP is usually under good control, typically in the 130s when she checks at home.  No smoking history.  No history of heart disease in her immediate family.  Echocardiogram on 10/16/2019 showed LVEF 70 to 77%, grade 1 diastolic dysfunction, normal RV function, no significant valvular disease.  Since last clinic visit, she reports that she has been doing well.  She denies any chest pain or dyspnea.  Denies any lightheadedness, syncope, palpitations, or lower extremity edema.  She reports that she is doing her home exercise videos for 30 minutes 3 times per week.  She denies any exertional symptoms   Past Medical History:  Diagnosis Date  . Anxiety   . Baker's cyst   . Chest pain   . Diabetes mellitus type 2, uncontrolled ([place])   . Diabetes mellitus without complication ([place])   . Disc disease, degenerative, cervical   . Diverticulosis of colon   . DJD  (degenerative joint disease) of knee   . Facial pain, atypical   . GERD (gastroesophageal reflux disease)   . Glaucoma   . Hypercholesteremia   . Hypertension   . Lumbar spondylosis   . Obesity   . Onychomycosis   . Osteopenia     Past Surgical History:  Procedure Laterality Date  . right eye surgery  09/2010   retina in right eye  . VAGINAL HYSTERECTOMY  2002   Dr. Kem Kays repair    Current Medications: Current Meds  Medication Sig  . aspirin 81 MG tablet Take 1 tablet (81 mg total) by mouth daily.  . blood glucose meter kit and supplies KIT Use to test blood sugar up to 3 times daily. DX E11.9  . Calcium Carb-Cholecalciferol (CALCIUM-VITAMIN D) 500-200 MG-UNIT tablet Take 1 tablet by mouth 2 (two) times daily with a meal.  . diclofenac Sodium (VOLTAREN) 1 % GEL Apply 4 g topically 4 (four) times daily.  . dorzolamide-timolol (COSOPT) 22.3-6.8 MG/ML ophthalmic solution Instill 1 drop in each eye twice a day  . DUREZOL 0.05 % EMUL   . empagliflozin (JARDIANCE) 25 MG TABS tablet Take 1 tablet (25 mg total) by mouth daily before breakfast.  . glimepiride (AMARYL) 4 MG tablet TAKE 1 TABLET EVERY DAY BEFORE BREAKFAST  . Lancet Devices (ACCU-CHEK SOFTCLIX) lancets Use to test blood sugar up to 3 times a day. DX E11.9  . lisinopril-hydrochlorothiazide (ZESTORETIC) 20-12.5 MG tablet Take  1 tablet by mouth daily.  . metFORMIN (GLUCOPHAGE) 1000 MG tablet Take 1 tablet (1,000 mg total) by mouth 2 (two) times daily with a meal.  . Multiple Vitamin (MULTI VITAMIN DAILY PO) Take one by mouth daily  . ONETOUCH VERIO test strip Use to test blood sugar 3 times a day  . pioglitazone (ACTOS) 30 MG tablet Take 1 tablet (30 mg total) by mouth daily.  . simvastatin (ZOCOR) 20 MG tablet Take 1 tablet (20 mg total) by mouth at bedtime.     Allergies:   Patient has no known allergies.   Social History   Socioeconomic History  . Marital status: Widowed    Spouse name: deceased Sep 21, 2008  .  Number of children: Not on file  . Years of education: Not on file  . Highest education level: Not on file  Occupational History  . Not on file  Tobacco Use  . Smoking status: Never Smoker  . Smokeless tobacco: Never Used  Vaping Use  . Vaping Use: Never used  Substance and Sexual Activity  . Alcohol use: No  . Drug use: No  . Sexual activity: Never  Other Topics Concern  . Not on file  Social History Narrative   Lives alone   Spouse passed away Jun 21, 2008   Son;    3 girls   Clinical cytogeneticist;    Social Determinants of Health   Financial Resource Strain:   . Difficulty of Paying Living Expenses: Not on file  Food Insecurity: No Food Insecurity  . Worried About Charity fundraiser in the Last Year: Never true  . Ran Out of Food in the Last Year: Never true  Transportation Needs: No Transportation Needs  . Lack of Transportation (Medical): No  . Lack of Transportation (Non-Medical): No  Physical Activity:   . Days of Exercise per Week: Not on file  . Minutes of Exercise per Session: Not on file  Stress:   . Feeling of Stress : Not on file  Social Connections:   . Frequency of Communication with Friends and Family: Not on file  . Frequency of Social Gatherings with Friends and Family: Not on file  . Attends Religious Services: Not on file  . Active Member of Clubs or Organizations: Not on file  . Attends Archivist Meetings: Not on file  . Marital Status: Not on file     Family History: The patient's family history includes Diabetes in her mother. There is no history of Colon cancer or Stomach cancer.  ROS:   Please see the history of present illness.     All other systems reviewed and are negative.  EKGs/Labs/Other Studies Reviewed:    The following studies were reviewed today:   EKG:  EKG is ordered today.  The ekg ordered today demonstrates normal sinus rhythm, rate 57, incomplete right bundle branch block, nonspecific T wave flattening  Recent Labs: 09/04/2019:  ALT 16; BUN 11; Creatinine, Ser 0.77; Hemoglobin 12.5; Platelets 199.0; Potassium 3.4; Pro B Natriuretic peptide (BNP) 33.0; Sodium 138  Recent Lipid Panel    Component Value Date/Time   CHOL 139 09/04/2019 1440   TRIG 111.0 09/04/2019 1440   HDL 40.90 09/04/2019 1440   CHOLHDL 3 09/04/2019 1440   VLDL 22.2 09/04/2019 1440   LDLCALC 76 09/04/2019 1440    Physical Exam:    VS:  BP 130/70   Pulse (!) 57   Ht '5\' 1"'  (1.549 m)   Wt 156 lb (70.8 kg)  SpO2 98%   BMI 29.48 kg/m     Wt Readings from Last 3 Encounters:  12/14/19 156 lb (70.8 kg)  09/28/19 162 lb (73.5 kg)  09/20/19 163 lb 3.2 oz (74 kg)     GEN:  in no acute distress HEENT: Normal NECK: No JVD; No carotid bruits CARDIAC: RRR, no murmurs, rubs, gallops RESPIRATORY:  Clear to auscultation without rales, wheezing or rhonchi  ABDOMEN: Soft, non-tender, non-distended MUSCULOSKELETAL:  No edema; No deformity  SKIN: Warm and dry NEUROLOGIC:  Alert and oriented x 3 PSYCHIATRIC:  Normal affect   ASSESSMENT:    1. Abnormal EKG   2. Essential hypertension   3. Hyperlipidemia, unspecified hyperlipidemia type    PLAN:    Abnormal EKG: incomplete RBBB.  No structural heart disease on echocardiogram  Hypertension: On lisinopril-hydrochlorothiazide 20-12.5 mg.  Appears controlled  T2DM: On Metformin, glimepiride, Actos.  A1c 7.5 on 09/04/2019.  Hyperlipidemia: On simvastatin 20 mg daily. LDL 76 on 09/04/2019.  RTC in 1 year   Medication Adjustments/Labs and Tests Ordered: Current medicines are reviewed at length with the patient today.  Concerns regarding medicines are outlined above.  No orders of the defined types were placed in this encounter.  No orders of the defined types were placed in this encounter.   Patient Instructions  Medication Instructions:  Your physician recommends that you continue on your current medications as directed. Please refer to the Current Medication list given to you today.  *If  you need a refill on your cardiac medications before your next appointment, please call your pharmacy*  Follow-Up: At [facility], you and your health needs are our priority.  As part of our continuing mission to provide you with exceptional heart care, we have created designated Provider Care Teams.  These Care Teams include your primary Cardiologist (physician) and Advanced Practice Providers (APPs -  Physician Assistants and Nurse Practitioners) who all work together to provide you with the care you need, when you need it.  We recommend signing up for the patient portal called "MyChart".  Sign up information is provided on this After Visit Summary.  MyChart is used to connect with patients for Virtual Visits (Telemedicine).  Patients are able to view lab/test results, encounter notes, upcoming appointments, etc.  Non-urgent messages can be sent to your provider as well.   To learn more about what you can do with MyChart, go to [email or web address].    Your next appointment:   12 month(s)  The format for your next appointment:   In Person  Provider:   Oswaldo Milian, MD       Signed, Donato Heinz, MD  12/14/2019 1:09 PM    [place]

## 2019-12-14 ENCOUNTER — Ambulatory Visit: Payer: HMO | Admitting: Cardiology

## 2019-12-14 ENCOUNTER — Telehealth: Payer: Self-pay | Admitting: Internal Medicine

## 2019-12-14 ENCOUNTER — Other Ambulatory Visit: Payer: Self-pay

## 2019-12-14 ENCOUNTER — Encounter: Payer: Self-pay | Admitting: Cardiology

## 2019-12-14 VITALS — BP 130/70 | HR 57 | Ht 61.0 in | Wt 156.0 lb

## 2019-12-14 DIAGNOSIS — R9431 Abnormal electrocardiogram [ECG] [EKG]: Secondary | ICD-10-CM

## 2019-12-14 DIAGNOSIS — E1169 Type 2 diabetes mellitus with other specified complication: Secondary | ICD-10-CM

## 2019-12-14 DIAGNOSIS — E785 Hyperlipidemia, unspecified: Secondary | ICD-10-CM

## 2019-12-14 DIAGNOSIS — I10 Essential (primary) hypertension: Secondary | ICD-10-CM

## 2019-12-14 NOTE — Progress Notes (Signed)
  Chronic Care Management   Note  12/14/2019 Name: Whitney [name] MRN: [id number] DOB: [birth year]  Whitney [name] is a 85 y.o. year old female who is a primary care patient of [name], MD. I reached out to Whitney [name] by phone today in response to a referral sent by Ms. Wilson R Luther's PCP, [name], MD.   Ms. Orand was given information about Chronic Care Management services today including:  1. CCM service includes personalized support from designated clinical staff supervised by her physician, including individualized plan of care and coordination with other care providers 2. 24/7 contact phone numbers for assistance for urgent and routine care needs. 3. Service will only be billed when office clinical staff spend 20 minutes or more in a month to coordinate care. 4. Only one practitioner may furnish and bill the service in a calendar month. 5. The patient may stop CCM services at any time (effective at the end of the month) by phone call to the office staff.   Patient agreed to services and verbal consent obtained.   Follow up plan:   Earney [place] Upstream Scheduler

## 2019-12-14 NOTE — Patient Instructions (Signed)

## 2019-12-17 ENCOUNTER — Telehealth: Payer: Self-pay

## 2019-12-17 NOTE — Telephone Encounter (Signed)
lvm TO RTN MY CALL TO R/S AWV WITH NHA

## 2019-12-18 ENCOUNTER — Other Ambulatory Visit: Payer: Self-pay

## 2019-12-18 ENCOUNTER — Encounter (INDEPENDENT_AMBULATORY_CARE_PROVIDER_SITE_OTHER): Payer: Self-pay | Admitting: Ophthalmology

## 2019-12-18 ENCOUNTER — Ambulatory Visit (INDEPENDENT_AMBULATORY_CARE_PROVIDER_SITE_OTHER): Payer: HMO | Admitting: Ophthalmology

## 2019-12-18 DIAGNOSIS — E113311 Type 2 diabetes mellitus with moderate nonproliferative diabetic retinopathy with macular edema, right eye: Secondary | ICD-10-CM | POA: Diagnosis not present

## 2019-12-18 MED ORDER — BEVACIZUMAB CHEMO INJECTION 1.25MG/0.05ML SYRINGE FOR KALEIDOSCOPE
1.2500 mg | INTRAVITREAL | Status: AC | PRN
  Administered 2019-12-18: 1.25 mg via INTRAVITREAL

## 2019-12-18 NOTE — Patient Instructions (Signed)
To report new onset visual acuity declines or distortions in either eye  Continues on nightly CPAP use and incidentally has been some weight loss apparent

## 2019-12-18 NOTE — Progress Notes (Signed)
12/18/2019     CHIEF COMPLAINT Patient presents for Retina Follow Up   HISTORY OF PRESENT ILLNESS: Whitney [name] is a 85 y.o. female who presents to the clinic today for:   HPI    Retina Follow Up    Patient presents with  Diabetic Retinopathy.  In right eye.  Severity is moderate.  Duration of 10 weeks.  Since onset it is stable.  I, the attending physician,  performed the HPI with the patient and updated documentation appropriately.          Comments    10 Week NPDR f\u OD. Possible Avastin OD. OCT  Pt states no changes in vision. Denies any complaints. Did not check BGL       Last edited by Tilda Franco on 12/18/2019  8:47 AM. (History)      Referring physician: [name], MD [place],  [place] [id number]  HISTORICAL INFORMATION:   Selected notes from the MEDICAL RECORD NUMBER    Lab Results  Component Value Date   HGBA1C 7.5 (H) 09/04/2019     CURRENT MEDICATIONS: Current Outpatient Medications (Ophthalmic Drugs)  Medication Sig  . dorzolamide-timolol (COSOPT) 22.3-6.8 MG/ML ophthalmic solution Instill 1 drop in each eye twice a day  . DUREZOL 0.05 % EMUL    No current facility-administered medications for this visit. (Ophthalmic Drugs)   Current Outpatient Medications (Other)  Medication Sig  . aspirin 81 MG tablet Take 1 tablet (81 mg total) by mouth daily.  . blood glucose meter kit and supplies KIT Use to test blood sugar up to 3 times daily. DX E11.9  . Calcium Carb-Cholecalciferol (CALCIUM-VITAMIN D) 500-200 MG-UNIT tablet Take 1 tablet by mouth 2 (two) times daily with a meal.  . diclofenac Sodium (VOLTAREN) 1 % GEL Apply 4 g topically 4 (four) times daily.  . empagliflozin (JARDIANCE) 25 MG TABS tablet Take 1 tablet (25 mg total) by mouth daily before breakfast.  . glimepiride (AMARYL) 4 MG tablet TAKE 1 TABLET EVERY DAY BEFORE BREAKFAST  . Lancet Devices (ACCU-CHEK SOFTCLIX) lancets Use to test blood sugar up to 3  times a day. DX E11.9  . lisinopril-hydrochlorothiazide (ZESTORETIC) 20-12.5 MG tablet Take 1 tablet by mouth daily.  . metFORMIN (GLUCOPHAGE) 1000 MG tablet Take 1 tablet (1,000 mg total) by mouth 2 (two) times daily with a meal.  . Multiple Vitamin (MULTI VITAMIN DAILY PO) Take one by mouth daily  . ONETOUCH VERIO test strip Use to test blood sugar 3 times a day  . pioglitazone (ACTOS) 30 MG tablet Take 1 tablet (30 mg total) by mouth daily.  . simvastatin (ZOCOR) 20 MG tablet Take 1 tablet (20 mg total) by mouth at bedtime.   No current facility-administered medications for this visit. (Other)      REVIEW OF SYSTEMS: ROS    Positive for: Endocrine   Last edited by Tilda Franco on 12/18/2019  8:47 AM. (History)       ALLERGIES No Known Allergies  PAST MEDICAL HISTORY Past Medical History:  Diagnosis Date  . Anxiety   . Baker's cyst   . Chest pain   . Diabetes mellitus type 2, uncontrolled ([place])   . Diabetes mellitus without complication ([place])   . Disc disease, degenerative, cervical   . Diverticulosis of colon   . DJD (degenerative joint disease) of knee   . Facial pain, atypical   . GERD (gastroesophageal reflux disease)   . Glaucoma   .  Hypercholesteremia   . Hypertension   . Lumbar spondylosis   . Obesity   . Onychomycosis   . Osteopenia    Past Surgical History:  Procedure Laterality Date  . right eye surgery  09/2010   retina in right eye  . VAGINAL HYSTERECTOMY  2002   Dr. Kem Kays repair    FAMILY HISTORY Family History  Problem Relation Age of Onset  . Diabetes Mother   . Colon cancer Neg Hx   . Stomach cancer Neg Hx     SOCIAL HISTORY Social History   Tobacco Use  . Smoking status: Never Smoker  . Smokeless tobacco: Never Used  Vaping Use  . Vaping Use: Never used  Substance Use Topics  . Alcohol use: No  . Drug use: No         OPHTHALMIC EXAM:  Base Eye Exam    Visual Acuity (Snellen - Linear)      Right Left   Dist cc  20/30 20/30 -2   Correction: Glasses       Tonometry (Tonopen, 8:52 AM)      Right Left   Pressure 17 14       Pupils      Pupils Dark Light Shape React APD   Right PERRL 4 3 Irregular Brisk None   Left PERRL 4 3 Irregular Brisk None       Visual Fields (Counting fingers)      Left Right    Full Full       Neuro/Psych    Oriented x3: Yes   Mood/Affect: Normal       Dilation    Right eye: 1.0% Mydriacyl, 2.5% Phenylephrine @ 8:52 AM        Slit Lamp and Fundus Exam    External Exam      Right Left   External Normal Normal       Slit Lamp Exam      Right Left   Lids/Lashes Normal Normal   Conjunctiva/Sclera White and quiet White and quiet   Cornea Clear Clear   Anterior Chamber Deep and quiet Deep and quiet   Iris Round and reactive Round and reactive   Lens Posterior chamber intraocular lens Posterior chamber intraocular lens   Anterior Vitreous Normal Normal       Fundus Exam      Right Left   Posterior Vitreous Normal    Disc Normal    C/D Ratio 0.55    Macula Microaneurysms,, no clinical CSME    Vessels NPDR, moderate    Periphery Normal           IMAGING AND PROCEDURES  Imaging and Procedures for 12/18/19  OCT, Retina - OU - Both Eyes       Right Eye Quality was good. Scan locations included subfoveal. Central Foveal Thickness: 250. Progression has improved.   Left Eye Quality was good. Scan locations included subfoveal. Central Foveal Thickness: 243. Progression has been stable. Findings include cystoid macular edema.   Notes OD, currently at 10  weeks status post recent examination and injection, the region superior to the fovea improved on therapy improved and stable  OS, perifoveal CME temporally, stable overall and improved now the patient on CPAP yet still active CSME temporally       Intravitreal Injection, Pharmacologic Agent - OD - Right Eye       Time Out 12/18/2019. 9:36 AM. Confirmed correct patient, procedure, site,  and patient consented.   Anesthesia Topical anesthesia  was used. Anesthetic medications included Akten 3.5%.   Procedure Preparation included 10% betadine to eyelids, 5% betadine to ocular surface, Tobramycin 0.3%. A 30 gauge needle was used.   Injection:  1.25 mg Bevacizumab (AVASTIN) SOLN   NDC: [address]-1, Lot: [id number]   Route: Intravitreal, Site: Right Eye, Waste: 0 mg  Post-op Post injection exam found visual acuity of at least counting fingers. The patient tolerated the procedure well. There were no complications. The patient received written and verbal post procedure care education. Post injection medications were not given.                 ASSESSMENT/PLAN:  Moderate nonproliferative diabetic retinopathy of right eye with macular edema (HCC) Much improved now on intravitreal Avastin currently 10-week interval and on ongoing CPAP use.  Patient much more responsive now patient is on CPAP.  We will repeat injection today and examination OD in 3 months      ICD-10-CM   1. Moderate nonproliferative diabetic retinopathy of right eye with macular edema associated with type 2 diabetes mellitus (HCC)  E11.3311 OCT, Retina - OU - Both Eyes    Intravitreal Injection, Pharmacologic Agent - OD - Right Eye    Bevacizumab (AVASTIN) SOLN 1.25 mg    1.  2.  3.  Ophthalmic Meds Ordered this visit:  Meds ordered this encounter  Medications  . Bevacizumab (AVASTIN) SOLN 1.25 mg       Return in about 3 months (around 03/18/2020) for dilate, OD, AVASTIN OCT.  Patient Instructions  To report new onset visual acuity declines or distortions in either eye  Continues on nightly CPAP use and incidentally has been some weight loss apparent    Explained the diagnoses, plan, and follow up with the patient and they expressed understanding.  Patient expressed understanding of the importance of proper follow up care.   Clent Demark Sonja Manseau M.D. Diseases & Surgery of the Retina and  Vitreous Retina & Diabetic [place] 12/18/19     Abbreviations: M myopia (nearsighted); A astigmatism; H hyperopia (farsighted); P presbyopia; Mrx spectacle prescription;  CTL contact lenses; OD right eye; OS left eye; OU both eyes  XT exotropia; ET esotropia; PEK punctate epithelial keratitis; PEE punctate epithelial erosions; DES dry eye syndrome; MGD meibomian gland dysfunction; ATs artificial tears; PFAT's preservative free artificial tears; [place] nuclear sclerotic cataract; PSC posterior subcapsular cataract; ERM epi-retinal membrane; PVD posterior vitreous detachment; RD retinal detachment; DM diabetes mellitus; DR diabetic retinopathy; NPDR non-proliferative diabetic retinopathy; PDR proliferative diabetic retinopathy; CSME clinically significant macular edema; DME diabetic macular edema; dbh dot blot hemorrhages; CWS cotton wool spot; POAG primary open angle glaucoma; C/D cup-to-disc ratio; HVF humphrey visual field; GVF goldmann visual field; OCT optical coherence tomography; IOP intraocular pressure; BRVO Branch retinal vein occlusion; CRVO central retinal vein occlusion; CRAO central retinal artery occlusion; BRAO branch retinal artery occlusion; RT retinal tear; SB scleral buckle; PPV pars plana vitrectomy; VH Vitreous hemorrhage; PRP panretinal laser photocoagulation; IVK intravitreal kenalog; VMT vitreomacular traction; MH Macular hole;  NVD neovascularization of the disc; NVE neovascularization elsewhere; AREDS age related eye disease study; ARMD age related macular degeneration; POAG primary open angle glaucoma; EBMD epithelial/anterior basement membrane dystrophy; ACIOL anterior chamber intraocular lens; IOL intraocular lens; PCIOL posterior chamber intraocular lens; Phaco/IOL phacoemulsification with intraocular lens placement; [employer] photorefractive keratectomy; LASIK laser assisted in situ keratomileusis; HTN hypertension; DM diabetes mellitus; COPD chronic obstructive pulmonary disease

## 2019-12-18 NOTE — Assessment & Plan Note (Signed)
Much improved now on intravitreal Avastin currently 10-week interval and on ongoing CPAP use.  Patient much more responsive now patient is on CPAP.  We will repeat injection today and examination OD in 3 months

## 2019-12-24 DIAGNOSIS — B372 Candidiasis of skin and nail: Secondary | ICD-10-CM | POA: Diagnosis not present

## 2019-12-25 ENCOUNTER — Encounter (INDEPENDENT_AMBULATORY_CARE_PROVIDER_SITE_OTHER): Payer: Self-pay | Admitting: Ophthalmology

## 2019-12-25 ENCOUNTER — Ambulatory Visit (INDEPENDENT_AMBULATORY_CARE_PROVIDER_SITE_OTHER): Payer: HMO | Admitting: Ophthalmology

## 2019-12-25 ENCOUNTER — Other Ambulatory Visit: Payer: Self-pay

## 2019-12-25 DIAGNOSIS — H35072 Retinal telangiectasis, left eye: Secondary | ICD-10-CM

## 2019-12-25 DIAGNOSIS — E113312 Type 2 diabetes mellitus with moderate nonproliferative diabetic retinopathy with macular edema, left eye: Secondary | ICD-10-CM | POA: Diagnosis not present

## 2019-12-25 MED ORDER — AFLIBERCEPT 2MG/0.05ML IZ SOLN FOR KALEIDOSCOPE
2.0000 mg | INTRAVITREAL | Status: AC | PRN
  Administered 2019-12-25: 2 mg via INTRAVITREAL

## 2019-12-25 NOTE — Progress Notes (Signed)
12/25/2019     CHIEF COMPLAINT Patient presents for Retina Follow Up   HISTORY OF PRESENT ILLNESS: Whitney [name] is a 85 y.o. female who presents to the clinic today for:   HPI    Retina Follow Up    Patient presents with  Diabetic Retinopathy.  In left eye.  Severity is moderate.  Duration of 8 weeks.  Since onset it is stable.  I, the attending physician,  performed the HPI with the patient and updated documentation appropriately.          Comments    8 Week NPDR f\u OS. Possible Eylea OS. OCT  Pt states no changes in vision. Denies any complaints BGL: 174 this AM       Last edited by Tilda Franco on 12/25/2019  9:09 AM. (History)      Referring physician: [name], MD [place],  [place] [address]  HISTORICAL INFORMATION:   Selected notes from the MEDICAL RECORD NUMBER    Lab Results  Component Value Date   HGBA1C 7.5 (H) 09/04/2019     CURRENT MEDICATIONS: Current Outpatient Medications (Ophthalmic Drugs)  Medication Sig  . dorzolamide-timolol (COSOPT) 22.3-6.8 MG/ML ophthalmic solution Instill 1 drop in each eye twice a day  . DUREZOL 0.05 % EMUL    No current facility-administered medications for this visit. (Ophthalmic Drugs)   Current Outpatient Medications (Other)  Medication Sig  . aspirin 81 MG tablet Take 1 tablet (81 mg total) by mouth daily.  . blood glucose meter kit and supplies KIT Use to test blood sugar up to 3 times daily. DX E11.9  . Calcium Carb-Cholecalciferol (CALCIUM-VITAMIN D) 500-200 MG-UNIT tablet Take 1 tablet by mouth 2 (two) times daily with a meal.  . diclofenac Sodium (VOLTAREN) 1 % GEL Apply 4 g topically 4 (four) times daily.  . empagliflozin (JARDIANCE) 25 MG TABS tablet Take 1 tablet (25 mg total) by mouth daily before breakfast.  . glimepiride (AMARYL) 4 MG tablet TAKE 1 TABLET EVERY DAY BEFORE BREAKFAST  . Lancet Devices (ACCU-CHEK SOFTCLIX) lancets Use to test blood sugar up to 3 times  a day. DX E11.9  . lisinopril-hydrochlorothiazide (ZESTORETIC) 20-12.5 MG tablet Take 1 tablet by mouth daily.  . metFORMIN (GLUCOPHAGE) 1000 MG tablet Take 1 tablet (1,000 mg total) by mouth 2 (two) times daily with a meal.  . Multiple Vitamin (MULTI VITAMIN DAILY PO) Take one by mouth daily  . ONETOUCH VERIO test strip Use to test blood sugar 3 times a day  . pioglitazone (ACTOS) 30 MG tablet Take 1 tablet (30 mg total) by mouth daily.  . simvastatin (ZOCOR) 20 MG tablet Take 1 tablet (20 mg total) by mouth at bedtime.   No current facility-administered medications for this visit. (Other)      REVIEW OF SYSTEMS:    ALLERGIES No Known Allergies  PAST MEDICAL HISTORY Past Medical History:  Diagnosis Date  . Anxiety   . Baker's cyst   . Chest pain   . Diabetes mellitus type 2, uncontrolled ([place])   . Diabetes mellitus without complication ([place])   . Disc disease, degenerative, cervical   . Diverticulosis of colon   . DJD (degenerative joint disease) of knee   . Facial pain, atypical   . GERD (gastroesophageal reflux disease)   . Glaucoma   . Hypercholesteremia   . Hypertension   . Lumbar spondylosis   . Obesity   . Onychomycosis   . Osteopenia  Past Surgical History:  Procedure Laterality Date  . right eye surgery  09/2010   retina in right eye  . VAGINAL HYSTERECTOMY  2002   Dr. Kem Kays repair    FAMILY HISTORY Family History  Problem Relation Age of Onset  . Diabetes Mother   . Colon cancer Neg Hx   . Stomach cancer Neg Hx     SOCIAL HISTORY Social History   Tobacco Use  . Smoking status: Never Smoker  . Smokeless tobacco: Never Used  Vaping Use  . Vaping Use: Never used  Substance Use Topics  . Alcohol use: No  . Drug use: No         OPHTHALMIC EXAM:  Base Eye Exam    Visual Acuity (Snellen - Linear)      Right Left   Dist cc 20/30 + 20/30   Correction: Glasses       Tonometry (Tonopen, 9:13 AM)      Right Left   Pressure 13 14         Pupils      Pupils Dark Light Shape React APD   Right PERRL 4 3 Irregular Brisk None   Left PERRL 4 3 Irregular Brisk None       Visual Fields      Left Right    Full Full       Neuro/Psych    Oriented x3: Yes   Mood/Affect: Normal       Dilation    Left eye: 1.0% Mydriacyl, 2.5% Phenylephrine @ 9:13 AM        Slit Lamp and Fundus Exam    External Exam      Right Left   External Normal Normal       Slit Lamp Exam      Right Left   Lids/Lashes Normal Normal   Conjunctiva/Sclera White and quiet White and quiet   Cornea Clear Clear   Anterior Chamber Deep and quiet Deep and quiet   Iris Round and reactive Round and reactive   Lens Posterior chamber intraocular lens Posterior chamber intraocular lens   Anterior Vitreous Normal Normal       Fundus Exam      Right Left   Posterior Vitreous Normal Normal   Disc Normal Normal   C/D Ratio  0.7   Macula  Microaneurysms, Macular thickening, Cystoid macular edema, Mild clinically significant macular edema   Vessels  NPDR, moderate   Periphery Normal Normal          IMAGING AND PROCEDURES  Imaging and Procedures for 12/25/19  OCT, Retina - OU - Both Eyes       Right Eye Quality was good. Scan locations included subfoveal. Central Foveal Thickness: 236. Progression has improved. Findings include abnormal foveal contour, cystoid macular edema.   Left Eye Quality was good. Scan locations included subfoveal. Central Foveal Thickness: 244. Progression has improved. Findings include cystoid macular edema.   Notes OD, improved 1 week status post Avastin with normal central foveal contour, minor areas of thickening superior and inferior to the fovea.  OS region temporal to the fovea in the area of massive retinal telangiectasis, macular telangiectasis in the past.  This area is much smaller overall, on CPAP, and combination with intravitreal Eylea.  The latter condition treats the CSME component        Intravitreal Injection, Pharmacologic Agent - OS - Left Eye       Time Out 12/25/2019. 9:32 AM. Confirmed correct patient, procedure, site,  and patient consented.   Anesthesia Topical anesthesia was used. Anesthetic medications included [place] 3.5%.   Procedure Preparation included Tobramycin 0.3%, 10% betadine to eyelids, 5% betadine to ocular surface. A 30 gauge needle was used.   Injection:  2 mg aflibercept Alfonse Flavors) SOLN   NDC: [id number], Lot: [id number]   Route: Intravitreal, Site: Left Eye, Waste: 0 mg  Post-op Post injection exam found visual acuity of at least counting fingers. The patient tolerated the procedure well. There were no complications. The patient received written and verbal post procedure care education. Post injection medications were not given.                 ASSESSMENT/PLAN:  Moderate nonproliferative diabetic retinopathy of left eye with macular edema associated with type 2 diabetes mellitus (HCC) At 8-week visit, CSME has improved and is largely relegated to the region temporal to the fovea.  This is an area with prior massive CME likely from macular telangiectasis.  This component of the disease has improved dramatically on the use of CPAP and also with continued Eylea.  Perhaps the treatment of MAC-TEL with CPAP improves oxygenation, and improved susceptibility to therapy with Eylea  Type 2 macular telangiectasis, left CME OS improved on CPAP continue CPAP usage      ICD-10-CM   1. Moderate nonproliferative diabetic retinopathy of left eye with macular edema associated with type 2 diabetes mellitus (HCC)  C58.8502 OCT, Retina - OU - Both Eyes    Intravitreal Injection, Pharmacologic Agent - OS - Left Eye    aflibercept (EYLEA) SOLN 2 mg  2. Type 2 macular telangiectasis, left  H35.072     1.  OS, CSME continues to improve, on combination of CPAP for the macular telangiectasis component and improved susceptibility to ongoing Eylea usage.   Currently at 8-week interval.  We will repeat injection today OS and extend interval examination left eye to 9 weeks  2.  OD, continued improvement of perifoveal CSME, on intravitreal Avastin, today 7 days post examination and therapy.  3.  OD follow-up as scheduled  Ophthalmic Meds Ordered this visit:  Meds ordered this encounter  Medications  . aflibercept (EYLEA) SOLN 2 mg       Return in about 9 weeks (around 02/26/2020) for dilate, OS, EYLEA OCT.  There are no Patient Instructions on file for this visit.   Explained the diagnoses, plan, and follow up with the patient and they expressed understanding.  Patient expressed understanding of the importance of proper follow up care.   Clent Demark Asmar Brozek M.D. Diseases & Surgery of the Retina and Vitreous Retina & Diabetic [place] 12/25/19     Abbreviations: M myopia (nearsighted); A astigmatism; H hyperopia (farsighted); P presbyopia; Mrx spectacle prescription;  CTL contact lenses; OD right eye; OS left eye; OU both eyes  XT exotropia; ET esotropia; PEK punctate epithelial keratitis; PEE punctate epithelial erosions; DES dry eye syndrome; MGD meibomian gland dysfunction; ATs artificial tears; PFAT's preservative free artificial tears; [place] nuclear sclerotic cataract; PSC posterior subcapsular cataract; ERM epi-retinal membrane; PVD posterior vitreous detachment; RD retinal detachment; DM diabetes mellitus; DR diabetic retinopathy; NPDR non-proliferative diabetic retinopathy; PDR proliferative diabetic retinopathy; CSME clinically significant macular edema; DME diabetic macular edema; dbh dot blot hemorrhages; CWS cotton wool spot; POAG primary open angle glaucoma; C/D cup-to-disc ratio; HVF humphrey visual field; GVF goldmann visual field; OCT optical coherence tomography; IOP intraocular pressure; BRVO Branch retinal vein occlusion; CRVO central retinal vein occlusion; CRAO central retinal artery occlusion; BRAO  branch retinal artery  occlusion; RT retinal tear; SB scleral buckle; PPV pars plana vitrectomy; VH Vitreous hemorrhage; PRP panretinal laser photocoagulation; IVK intravitreal kenalog; VMT vitreomacular traction; MH Macular hole;  NVD neovascularization of the disc; NVE neovascularization elsewhere; AREDS age related eye disease study; ARMD age related macular degeneration; POAG primary open angle glaucoma; EBMD epithelial/anterior basement membrane dystrophy; ACIOL anterior chamber intraocular lens; IOL intraocular lens; PCIOL posterior chamber intraocular lens; Phaco/IOL phacoemulsification with intraocular lens placement; [place] photorefractive keratectomy; LASIK laser assisted in situ keratomileusis; HTN hypertension; DM diabetes mellitus; COPD chronic obstructive pulmonary disease

## 2019-12-25 NOTE — Assessment & Plan Note (Signed)
CME OS improved on CPAP continue CPAP usage

## 2019-12-25 NOTE — Assessment & Plan Note (Signed)
At 8-week visit, CSME has improved and is largely relegated to the region temporal to the fovea.  This is an area with prior massive CME likely from macular telangiectasis.  This component of the disease has improved dramatically on the use of CPAP and also with continued Eylea.  Perhaps the treatment of MAC-TEL with CPAP improves oxygenation, and improved susceptibility to therapy with [facility]

## 2020-01-04 ENCOUNTER — Telehealth: Payer: Self-pay | Admitting: Internal Medicine

## 2020-01-04 MED ORDER — METFORMIN HCL 1000 MG PO TABS: 1000.0000 mg | ORAL_TABLET | Freq: Two times a day (BID) | ORAL | 1 refills | Status: DC

## 2020-01-04 MED ORDER — LISINOPRIL-HYDROCHLOROTHIAZIDE 20-12.5 MG PO TABS: 1.0000 | ORAL_TABLET | Freq: Every day | ORAL | 1 refills | Status: DC

## 2020-01-04 MED ORDER — PIOGLITAZONE HCL 30 MG PO TABS: 30.0000 mg | ORAL_TABLET | Freq: Every day | ORAL | 1 refills | Status: DC

## 2020-01-04 MED ORDER — DICLOFENAC SODIUM 1 % EX GEL: 4.0000 g | Freq: Four times a day (QID) | CUTANEOUS | 6 refills | Status: DC

## 2020-01-04 MED ORDER — EMPAGLIFLOZIN 25 MG PO TABS
25.0000 mg | ORAL_TABLET | Freq: Every day | ORAL | 1 refills | Status: DC
Start: 2020-01-04 — End: 2020-03-17

## 2020-01-04 MED ORDER — GLIMEPIRIDE 4 MG PO TABS: ORAL_TABLET | ORAL | 1 refills | Status: DC

## 2020-01-04 MED ORDER — SIMVASTATIN 20 MG PO TABS: 20.0000 mg | ORAL_TABLET | Freq: Every day | ORAL | 1 refills | Status: DC

## 2020-01-04 NOTE — Telephone Encounter (Signed)
   [facility] pharmacy requesting clarification on Voltaren gel. Are the instruction to use PRN 4x a day or strictly 4x daily. If PRN how many tubes needed for 90 day supple. Call [phone] Ref [id number]

## 2020-01-04 NOTE — Telephone Encounter (Signed)
   1.Medication Requested:  Calcium Carb-Cholecalciferol (CALCIUM-VITAMIN D) 500-200 MG-UNIT tablet diclofenac Sodium (VOLTAREN) 1 % GEL dorzolamide-timolol (COSOPT) 22.3-6.8 MG/ML ophthalmic solution empagliflozin (JARDIANCE) 25 MG TABS tablet glimepiride (AMARYL) 4 MG tablet lisinopril-hydrochlorothiazide (ZESTORETIC) 20-12.5 MG tablet metFORMIN (GLUCOPHAGE) 1000 MG tablet ONETOUCH VERIO test strip pioglitazone (ACTOS) 30 MG tablet  2. Pharmacy (Name, [facility] ([place] - [place], [place]  3. On Med List: yes  4. Last Visit with PCP: 09/28/19  5. Next visit date with PCP: n/a   Agent: Please be advised that RX refills may take up to 3 business days. We ask that you follow-up with your pharmacy.

## 2020-01-07 ENCOUNTER — Telehealth: Payer: Self-pay | Admitting: Internal Medicine

## 2020-01-07 MED ORDER — ONETOUCH VERIO VI STRP: ORAL_STRIP | 1 refills | Status: DC

## 2020-01-07 MED ORDER — DORZOLAMIDE HCL-TIMOLOL MAL 2-0.5 % OP SOLN: OPHTHALMIC | 1 refills | Status: DC

## 2020-01-07 NOTE — Telephone Encounter (Signed)
Mickel Baas w/[facility] mail order pharmacy  [phone]  Ref# [id number]  diclofenac Sodium (VOLTAREN) 1 % GEL  1. Should "as needed" be added to the instructions 2. How many 100 gr tubes for a 90 day supply? 3. How many refills?  Please call the pharmacy

## 2020-01-07 NOTE — Telephone Encounter (Signed)
I have spoken to [name]. I have informed her that medication had been written as prescribed in the past after reviewing her chart.

## 2020-01-07 NOTE — Telephone Encounter (Signed)
Patient called and was requesting a medication refill for  dorzolamide-timolol (COSOPT) 22.3-6.8 MG/ML ophthalmic solution  ONETOUCH VERIO test strip Can be sent to [facility] ([place], [address]  [place], [place] [address]

## 2020-01-18 ENCOUNTER — Other Ambulatory Visit: Payer: Self-pay | Admitting: Internal Medicine

## 2020-01-18 DIAGNOSIS — Z1231 Encounter for screening mammogram for malignant neoplasm of breast: Secondary | ICD-10-CM

## 2020-02-14 ENCOUNTER — Other Ambulatory Visit: Payer: Self-pay

## 2020-02-14 NOTE — Patient Outreach (Signed)
  [facility]) Care Management Chronic Special Needs Program    02/14/2020  Name: Whitney [name], DOB: [birth year]  MRN: [id number]   [facility] Management will continue to provide services for this member through 04/11/2020. The [facility] Care Management Team will assume care 04/12/2020.   Thea Silversmith, RN, MSN, [place] [phone]

## 2020-02-15 ENCOUNTER — Telehealth: Payer: Self-pay | Admitting: Pharmacist

## 2020-02-15 NOTE — Progress Notes (Signed)
Chronic Care Management Pharmacy Assistant   Name: Whitney [name]  MRN: [id number] DOB: [birth year]  Reason for Encounter: Medication Review-Initial Questions for Pharmacist for visit on 02/18/2020  Have you seen any other providers since your last visit? Yes, patient saw Whitney [name] the cardiologist on 9/3/21whos said that she was doing well and doesn't need to be seen for a rear. Also Whitney [name] the ophthalmology on 12/25/19  Any changes in your medications or health? No  Any side effects from any medications? No  Do you have an symptoms or problems not managed by your medications? Patient is having some drainage from sinus, thinks it may be from allergies  Any concerns about your health right now? Some nights patient doesn't sleep well  Has your provider asked that you check blood pressure, blood sugar, or follow special diet at home? Patient is suppose to check blood sugar 3 times a day.This morning blood sugar was 143 yesterday it was 153 and last A1c was 7.7. She would like for it to be around 7.0 if possible. Patient blood pressure is sometimes a little high but feels ok  Do you get any type of exercise on a regular basis? Patient tries to get in some exercise at least 3 times a week  Can you think of a goal you would like to reach for your health? Patient states that she would like to come off of the glimepiride and jardiance is not working. She believes that her blood sugar is about the same while taking these medications and she sees no difference than before she started.  Do you have any problems getting your medications? No, patient has mail order for medications  Is there anything that you would like to discuss during the appointment? Patient states that sometimes feels like she is going loose her balance, but states that she is not having any dizziness   Please bring medications and supplements to appointment      PCP : [name], MD  Allergies:   No Known Allergies  Medications: Outpatient Encounter Medications as of 02/15/2020  Medication Sig Note   aspirin 81 MG tablet Take 1 tablet (81 mg total) by mouth daily.    blood glucose meter kit and supplies KIT Use to test blood sugar up to 3 times daily. DX E11.9    Calcium Carb-Cholecalciferol (CALCIUM-VITAMIN D) 500-200 MG-UNIT tablet Take 1 tablet by mouth 2 (two) times daily with a meal.    diclofenac Sodium (VOLTAREN) 1 % GEL Apply 4 g topically 4 (four) times daily.    dorzolamide-timolol (COSOPT) 22.3-6.8 MG/ML ophthalmic solution Instill 1 drop in each eye twice a day    DUREZOL 0.05 % EMUL  03/17/2015: Received from: External Pharmacy   empagliflozin (JARDIANCE) 25 MG TABS tablet Take 1 tablet (25 mg total) by mouth daily before breakfast.    glimepiride (AMARYL) 4 MG tablet TAKE 1 TABLET EVERY DAY BEFORE BREAKFAST    glucose blood (ONETOUCH VERIO) test strip Use to test blood sugar 3 times a day    Lancet Devices (ACCU-CHEK SOFTCLIX) lancets Use to test blood sugar up to 3 times a day. DX E11.9    lisinopril-hydrochlorothiazide (ZESTORETIC) 20-12.5 MG tablet Take 1 tablet by mouth daily.    metFORMIN (GLUCOPHAGE) 1000 MG tablet Take 1 tablet (1,000 mg total) by mouth 2 (two) times daily with a meal.    Multiple Vitamin (MULTI VITAMIN DAILY PO) Take one by mouth daily 06/19/2013: Received from: External  Pharmacy   pioglitazone (ACTOS) 30 MG tablet Take 1 tablet (30 mg total) by mouth daily.    simvastatin (ZOCOR) 20 MG tablet Take 1 tablet (20 mg total) by mouth at bedtime.    No facility-administered encounter medications on file as of 02/15/2020.    Current Diagnosis: Patient Active Problem List   Diagnosis Date Noted   Moderate nonproliferative diabetic retinopathy of left eye with macular edema associated with type 2 diabetes mellitus ([place]) 08/13/2019   Moderate nonproliferative diabetic retinopathy of right eye with macular edema (HCC) 08/13/2019   Type 2  macular telangiectasis, left 08/13/2019   Retinal telangiectasia of right eye 08/13/2019   OSA on CPAP 07/20/2017   Venous insufficiency 04/26/2017   Sensorineural hearing loss (SNHL), bilateral 12/08/2016   Routine general medical examination at a health care facility 05/25/2016   Type 2 diabetes mellitus with hyperlipidemia ([place]) 02/23/2012   Obesity 08/23/2011   GLAUCOMA 08/24/2009   Left shoulder pain 02/19/2008   Hyperlipidemia associated with type 2 diabetes mellitus ([place]) 12/22/2006   Essential hypertension 12/22/2006   GERD 12/22/2006   [place] DISEASE, CERVICAL 12/22/2006   Osteopenia 12/22/2006   Other chest pain 12/22/2006    Goals Addressed   None     Follow-Up:  Pharmacist Review

## 2020-02-18 ENCOUNTER — Ambulatory Visit: Payer: HMO | Admitting: Pharmacist

## 2020-02-18 ENCOUNTER — Other Ambulatory Visit: Payer: Self-pay

## 2020-02-18 DIAGNOSIS — I10 Essential (primary) hypertension: Secondary | ICD-10-CM

## 2020-02-18 DIAGNOSIS — E1169 Type 2 diabetes mellitus with other specified complication: Secondary | ICD-10-CM

## 2020-02-18 DIAGNOSIS — E785 Hyperlipidemia, unspecified: Secondary | ICD-10-CM

## 2020-02-18 NOTE — Chronic Care Management (AMB) (Signed)
Chronic Care Management Pharmacy  Name: Whitney [name]  MRN: [id number] DOB: 23-Mar-1935   Chief Complaint/ HPI  Whitney [name],  85 y.o. , female presents for their Initial CCM visit with the clinical pharmacist via telephone due to COVID-19 Pandemic.  PCP : [name], MD Patient Care Team: [name], MD as PCP - General (Internal Medicine) Noralee Space, MD as Consulting Physician (Pulmonary Disease) Gean Birchwood, DPM as Consulting Physician (Podiatry) Luretha Rued, RN as [facility], [place], [facility] as Pharmacist (Pharmacist)  Pt has lived here her whole life, she has 4 children, 4 grandchildren, 3 great-grandchildren. Patient lives alone; she takes care of house work.  Their chronic conditions include: Hypertension, Hyperlipidemia, Diabetes, GERD and Osteopenia, Glaucoma, OSA  Office Visits: 09/28/19 Dr Sharlet Salina OV: f/u for DM. Fasting BG 188. Stop pioglitazone and start Jardiance 25 mg  09/04/19 Dr Sharlet Salina OV: f/u for neck/shoulder pain, ordered prednisone x 5 days  Consult Visit: 12/25/19 Dr Zadie Rhine (ophthalmology): f/u for macular telangiectasis  09/20/19 DrSchumann (cardiology): referred for abnormal EKG, ordered ECHO. BP elevated in clinic but normal at home, no changes.  No Known Allergies  Medications: Outpatient Encounter Medications as of 02/18/2020  Medication Sig Note  . aspirin 81 MG tablet Take 1 tablet (81 mg total) by mouth daily.   . blood glucose meter kit and supplies KIT Use to test blood sugar up to 3 times daily. DX E11.9   . Calcium Carb-Cholecalciferol (CALCIUM-VITAMIN D) 500-200 MG-UNIT tablet Take 1 tablet by mouth 2 (two) times daily with a meal.   . diclofenac Sodium (VOLTAREN) 1 % GEL Apply 4 g topically 4 (four) times daily.   . dorzolamide-timolol (COSOPT) 22.3-6.8 MG/ML ophthalmic solution Instill 1 drop in each eye twice a day   . empagliflozin (JARDIANCE) 25 MG TABS  tablet Take 1 tablet (25 mg total) by mouth daily before breakfast.   . glimepiride (AMARYL) 4 MG tablet TAKE 1 TABLET EVERY DAY BEFORE BREAKFAST   . glucose blood (ONETOUCH VERIO) test strip Use to test blood sugar 3 times a day   . Lancet Devices (ACCU-CHEK SOFTCLIX) lancets Use to test blood sugar up to 3 times a day. DX E11.9   . lisinopril-hydrochlorothiazide (ZESTORETIC) 20-12.5 MG tablet Take 1 tablet by mouth daily.   . metFORMIN (GLUCOPHAGE) 1000 MG tablet Take 1 tablet (1,000 mg total) by mouth 2 (two) times daily with a meal.   . Multiple Vitamin (MULTI VITAMIN DAILY PO) Take one by mouth daily 06/19/2013: Received from: External Pharmacy  . simvastatin (ZOCOR) 20 MG tablet Take 1 tablet (20 mg total) by mouth at bedtime.   . DUREZOL 0.05 % EMUL  (Patient not taking: Reported on 02/18/2020) 03/17/2015: Received from: External Pharmacy  . pioglitazone (ACTOS) 30 MG tablet Take 1 tablet (30 mg total) by mouth daily. (Patient not taking: Reported on 02/18/2020)    No facility-administered encounter medications on file as of 02/18/2020.    Wt Readings from Last 3 Encounters:  12/14/19 156 lb (70.8 kg)  09/28/19 162 lb (73.5 kg)  09/20/19 163 lb 3.2 oz (74 kg)    Current Diagnosis/Assessment:  SDOH Interventions     Most Recent Value  SDOH Interventions  Financial Strain Interventions Intervention Not Indicated      Goals Addressed            This Visit's Progress   . Pharmacy Care Plan  CARE PLAN ENTRY (see longitudinal plan of care for additional care plan information)  Current Barriers:  . Chronic Disease Management support, education, and care coordination needs related to Hypertension, Hyperlipidemia, and Diabetes   Hypertension BP Readings from Last 3 Encounters:  12/14/19 130/70  09/28/19 124/80  09/20/19 (!) 150/90 .  Pharmacist Clinical Goal(s): o Over the next 60 days, patient will work with PharmD and providers to maintain BP goal <140/90 . Current  regimen:  o Lisinopril-HCTZ 20-12.5 mg daily . Interventions: o Discussed BP goals and benefits of medications for prevention of heart attack / stroke . Patient self care activities - Over the next 60 days, patient will: o Check BP daily, document, and provide at future appointments o Ensure daily salt intake < 2300 mg/day  Hyperlipidemia Lab Results  Component Value Date/Time   LDLCALC 76 09/04/2019 02:40 PM .  Pharmacist Clinical Goal(s): o Over the next 60 days, patient will work with PharmD and providers to maintain LDL goal < 100 . Current regimen:  o Simvastatin 20 mg at bedtime . Interventions: o Discussed cholesterol goals and benefits of medications for prevention of heart attack / stroke . Patient self care activities - Over the next 60 days, patient will: o Continue current medications  Diabetes Lab Results  Component Value Date/Time   HGBA1C 7.5 (H) 09/04/2019 02:40 PM   HGBA1C 7.2 (A) 03/30/2019 09:07 AM   HGBA1C 7.4 (H) 09/29/2018 08:45 AM .  Pharmacist Clinical Goal(s): o Over the next 60 days, patient will work with PharmD and providers to achieve A1c goal <8% . Current regimen:  . Metformin 1000 mg twice a day . Glimepiride 4 mg daily  . Jardiance 25 mg daily . Interventions: o Discussed importance of maintaining sugars at goal to prevent complications of diabetes including kidney damage, retinal damage, and cardiovascular disease o Discussed glimepiride loses efficacy over 5-10 years; may consider Januvia to achieve A1c closer to 7% . Patient self care activities - Over the next 60 days, patient will: o Check blood sugar once daily, document, and provide at future appointments o Contact provider with any episodes of hypoglycemia  Medication management . Pharmacist Clinical Goal(s): o Over the next 60 days, patient will work with PharmD and providers to maintain optimal medication adherence . Current pharmacy: [facility]  order . Interventions o Comprehensive medication review performed. o Continue current medication management strategy . Patient self care activities - Over the next 60 days, patient will: o Focus on medication adherence by pill box o Take medications as prescribed o Report any questions or concerns to PharmD and/or provider(s)  Initial goal documentation       Hypertension   BP goal is:  <130/80  Office blood pressures are  BP Readings from Last 3 Encounters:  12/14/19 130/70  09/28/19 124/80  09/20/19 (!) 150/90   Lab Results  Component Value Date   CREATININE 0.77 09/04/2019   BUN 11 09/04/2019   GFR 86.72 09/04/2019   GFRNONAA 87.17 02/16/2010   GFRAA 156 02/19/2008   NA 138 09/04/2019   K 3.4 (L) 09/04/2019   CALCIUM 10.3 09/04/2019   CO2 31 09/04/2019   Patient checks BP at home daily Patient home BP readings are ranging: 155/56, 152/68, 139/62  Patient has failed these meds in the past: n/a Patient is currently controlled on the following medications:  . Lisinopril-HCTZ 20-12.5 mg daily AM  We discussed diet and exercise extensively; pt reports checking BP in AM some days; may consider  increasing lisinopril-HCTZ dose to achieve BP goal; pt has f/u scheduled next month with PCP  Plan  Continue current medications  Continue checking BP at home and bring readings to f/u appt with PCP     Hyperlipidemia   LDL goal < 100  Last lipids Lab Results  Component Value Date   CHOL 139 09/04/2019   HDL 40.90 09/04/2019   LDLCALC 76 09/04/2019   TRIG 111.0 09/04/2019   CHOLHDL 3 09/04/2019   Hepatic Function Latest Ref Rng & Units 09/04/2019 09/29/2018 02/02/2018  Total Protein 6.0 - 8.3 g/dL 8.0 7.4 7.7  Albumin 3.5 - 5.2 g/dL 4.1 3.9 3.9  AST 0 - 37 U/L _0 ALT 0 - 35 U/L _1 Alk Phosphatase 39 - 117 U/L 54 47 48  Total Bilirubin 0.2 - 1.2 mg/dL 1.1 0.7 0.6  Bilirubin, Direct 0.0 - 0.3 mg/dL - - -     The ASCVD Risk score Mikey Bussing DC Jr., et  al., 2013) failed to calculate for the following reasons:   The 2013 ASCVD risk score is only valid for ages 71 to 21   Patient has failed these meds in past: n/a Patient is currently controlled on the following medications:  . Simvastatin 20 mg HS . Aspirin 81 mg daily  We discussed:  diet and exercise extensively; Cholesterol goals; benefits of statin for ASCVD risk reduction  Plan  Continue current medications  Diabetes   A1c goal <8%  Recent Relevant Labs: Lab Results  Component Value Date/Time   HGBA1C 7.5 (H) 09/04/2019 02:40 PM   HGBA1C 7.2 (A) 03/30/2019 09:07 AM   HGBA1C 7.4 (H) 09/29/2018 08:45 AM   GFR 86.72 09/04/2019 02:40 PM   GFR 89.60 09/29/2018 08:45 AM   MICROALBUR 2.0 (H) 05/19/2015 09:21 AM   MICROALBUR 2.5 (H) 05/14/2014 10:01 AM    Last diabetic Eye exam:  Lab Results  Component Value Date/Time   HMDIABEYEEXA Retinopathy (A) 10/18/2018 12:00 AM    Last diabetic Foot exam: No results found for: HMDIABFOOTEX   Checking BG: Daily  Recent FBG Readings: 143, 153  Patient has failed these meds in past: pioglitazone  Patient is currently controlled on the following medications: Marland Kitchen Metformin 1000 mg BID . Glimepiride 4 mg daily AM (started ~2012) . Jardiance 25 mg daily AM . Testing supplies  We discussed: diet and exercise extensively; A1c goals - pt would prefer A1c < 7, discussed risks with aggressive A1c goal given age; agreed A1c ~7.5 is appropriate; also discussed glimepiride loses efficacy over 5-10 years, per chart she has taken this since at least 2012. Depending on repeat A1c, pt may benefit from stopping sulfonylurea altogether or replacing with DPP-IV inhibitor  Diet (2 meals/day with snacks) Breakfast - egg, toast, [place] sausage, coffee Snack - crackers Dinner - chicken breast, vegetables Very few starches   Plan  Continue current medications  Recommend repeat A1c in 1 month; may consider d/c glimepiride or replace with Januvia as  appropriate   Osteopenia   Normal bone density: Last DEXA Scan: 05/25/2016  T-Score femoral neck: +0.5  T-Score total hip: n/a  T-Score lumbar spine: +0.7  T-Score forearm radius: n/a  10-year probability of major osteoporotic fracture: n/a  10-year probability of hip fracture: n/a  Last vitamin D/Calcium Lab Results  Component Value Date   VD25OH 66 08/23/2011   CALCIUM 10.3 09/04/2019   Patient is not a candidate for pharmacologic treatment  Patient has failed these meds in  past: n/a Patient is currently controlled on the following medications:  . Calcium carbonate - Vitamin D 500-200 mg-IU BID  We discussed:  Recommend [phone] units of vitamin D daily. Recommend 1200 mg of calcium daily from dietary and supplemental sources. Per latest DEXA scan patient has normal bone density  Plan  Continue current medications  Glaucoma   Patient has failed these meds in past: n/a Patient is currently controlled on the following medications:  Marland Kitchen Dorzolamide-timolol (Cosopt) eye drops BID  We discussed:  Patient is satisfied with current regimen and denies issues, and follows regularly with ophthalmologist   Plan  Continue current medications  Pain   Cervical disc disease L shoulder pain  Patient is currently controlled on the following medications:  . Diclofenac 1% gel PRN  We discussed:  Patient is satisfied with current regimen and denies issues  Plan  Continue current medications  Health Maintenance   Patient is currently controlled on the following medications:  Marland Kitchen Multivitamin   We discussed:  Patient is satisfied with current regimen and denies issues  Plan  Continue current medications   Medication Management   Pt uses [facility] mail order pharmacy for all medications Uses pill box? Yes Pt endorses 100% compliance  We discussed: Current pharmacy is preferred with insurance plan and patient is satisfied with pharmacy services  Plan  Continue  current medication management strategy    Follow up: 2 month phone visit  Charlene Brooke, PharmD, BCACP Clinical Pharmacist [facility] [phone]

## 2020-02-18 NOTE — Patient Instructions (Addendum)
Visit Information  Phone number for Pharmacist: [phone]  Thank you for meeting with me to discuss your medications! I look forward to working with you to achieve your health care goals. Below is a summary of what we talked about during the visit:  Goals Addressed            This Visit's Progress   . Pharmacy Care Plan       CARE PLAN ENTRY (see longitudinal plan of care for additional care plan information)  Current Barriers:  . Chronic Disease Management support, education, and care coordination needs related to Hypertension, Hyperlipidemia, and Diabetes   Hypertension BP Readings from Last 3 Encounters:  12/14/19 130/70  09/28/19 124/80  09/20/19 (!) 150/90 .  Pharmacist Clinical Goal(s): o Over the next 60 days, patient will work with PharmD and providers to maintain BP goal <140/90 . Current regimen:  o Lisinopril-HCTZ 20-12.5 mg daily . Interventions: o Discussed BP goals and benefits of medications for prevention of heart attack / stroke . Patient self care activities - Over the next 60 days, patient will: o Check BP daily, document, and provide at future appointments o Ensure daily salt intake < 2300 mg/day  Hyperlipidemia Lab Results  Component Value Date/Time   LDLCALC 76 09/04/2019 02:40 PM .  Pharmacist Clinical Goal(s): o Over the next 60 days, patient will work with PharmD and providers to maintain LDL goal < 100 . Current regimen:  o Simvastatin 20 mg at bedtime . Interventions: o Discussed cholesterol goals and benefits of medications for prevention of heart attack / stroke . Patient self care activities - Over the next 60 days, patient will: o Continue current medications  Diabetes Lab Results  Component Value Date/Time   HGBA1C 7.5 (H) 09/04/2019 02:40 PM   HGBA1C 7.2 (A) 03/30/2019 09:07 AM   HGBA1C 7.4 (H) 09/29/2018 08:45 AM .  Pharmacist Clinical Goal(s): o Over the next 60 days, patient will work with PharmD and providers to achieve A1c  goal <8% . Current regimen:  . Metformin 1000 mg twice a day . Glimepiride 4 mg daily  . Jardiance 25 mg daily . Interventions: o Discussed importance of maintaining sugars at goal to prevent complications of diabetes including kidney damage, retinal damage, and cardiovascular disease o Discussed glimepiride loses efficacy over 5-10 years; may consider Januvia to achieve A1c closer to 7% . Patient self care activities - Over the next 60 days, patient will: o Check blood sugar once daily, document, and provide at future appointments o Contact provider with any episodes of hypoglycemia  Medication management . Pharmacist Clinical Goal(s): o Over the next 60 days, patient will work with PharmD and providers to maintain optimal medication adherence . Current pharmacy: [facility] order . Interventions o Comprehensive medication review performed. o Continue current medication management strategy . Patient self care activities - Over the next 60 days, patient will: o Focus on medication adherence by pill box o Take medications as prescribed o Report any questions or concerns to PharmD and/or provider(s)  Initial goal documentation      Whitney [name] was given information about Chronic Care Management services today including:  1. CCM service includes personalized support from designated clinical staff supervised by her physician, including individualized plan of care and coordination with other care providers 2. 24/7 contact phone numbers for assistance for urgent and routine care needs. 3. Standard insurance, coinsurance, copays and deductibles apply for chronic care management only during months in which we provide at least  20 minutes of these services. Most insurances cover these services at 100%, however patients may be responsible for any copay, coinsurance and/or deductible if applicable. This service may help you avoid the need for more expensive face-to-face services. 4. Only one  practitioner may furnish and bill the service in a calendar month. 5. The patient may stop CCM services at any time (effective at the end of the month) by phone call to the office staff.  Patient agreed to services and verbal consent obtained.   Patient verbalizes understanding of instructions provided today.  Telephone follow up appointment with pharmacy team member scheduled for: 2 months  Charlene Brooke, PharmD, BCACP Clinical Pharmacist [facility] [phone]  Diabetes Mellitus and Nutrition, Adult When you have diabetes (diabetes mellitus), it is very important to have healthy eating habits because your blood sugar (glucose) levels are greatly affected by what you eat and drink. Eating healthy foods in the appropriate amounts, at about the same times every day, can help you:  Control your blood glucose.  Lower your risk of heart disease.  Improve your blood pressure.  Reach or maintain a healthy weight. Every person with diabetes is different, and each person has different needs for a meal plan. Your health care provider may recommend that you work with a diet and nutrition specialist (dietitian) to make a meal plan that is best for you. Your meal plan may vary depending on factors such as:  The calories you need.  The medicines you take.  Your weight.  Your blood glucose, blood pressure, and cholesterol levels.  Your activity level.  Other health conditions you have, such as heart or kidney disease. How do carbohydrates affect me? Carbohydrates, also called carbs, affect your blood glucose level more than any other type of food. Eating carbs naturally raises the amount of glucose in your blood. Carb counting is a method for keeping track of how many carbs you eat. Counting carbs is important to keep your blood glucose at a healthy level, especially if you use insulin or take certain oral diabetes medicines. It is important to know how many  carbs you can safely have in each meal. This is different for every person. Your dietitian can help you calculate how many carbs you should have at each meal and for each snack. Foods that contain carbs include:  Bread, cereal, rice, pasta, and crackers.  Potatoes and corn.  Peas, beans, and lentils.  Milk and yogurt.  Fruit and juice.  Desserts, such as cakes, cookies, ice cream, and candy. How does alcohol affect me? Alcohol can cause a sudden decrease in blood glucose (hypoglycemia), especially if you use insulin or take certain oral diabetes medicines. Hypoglycemia can be a life-threatening condition. Symptoms of hypoglycemia (sleepiness, dizziness, and confusion) are similar to symptoms of having too much alcohol. If your health care provider says that alcohol is safe for you, follow these guidelines:  Limit alcohol intake to no more than 1 drink per day for nonpregnant women and 2 drinks per day for men. One drink equals 12 oz of beer, 5 oz of wine, or 1 oz of hard liquor.  Do not drink on an empty stomach.  Keep yourself hydrated with water, diet soda, or unsweetened iced tea.  Keep in mind that regular soda, juice, and other mixers may contain a lot of sugar and must be counted as carbs. What are tips for following this plan?  Reading food labels  Start by checking the  serving size on the "Nutrition Facts" label of packaged foods and drinks. The amount of calories, carbs, fats, and other nutrients listed on the label is based on one serving of the item. Many items contain more than one serving per package.  Check the total grams (g) of carbs in one serving. You can calculate the number of servings of carbs in one serving by dividing the total carbs by 15. For example, if a food has 30 g of total carbs, it would be equal to 2 servings of carbs.  Check the number of grams (g) of saturated and trans fats in one serving. Choose foods that have low or no amount of these  fats.  Check the number of milligrams (mg) of salt (sodium) in one serving. Most people should limit total sodium intake to less than 2,300 mg per day.  Always check the nutrition information of foods labeled as "low-fat" or "nonfat". These foods may be higher in added sugar or refined carbs and should be avoided.  Talk to your dietitian to identify your daily goals for nutrients listed on the label. Shopping  Avoid buying canned, premade, or processed foods. These foods tend to be high in fat, sodium, and added sugar.  Shop around the outside edge of the grocery store. This includes fresh fruits and vegetables, bulk grains, fresh meats, and fresh dairy. Cooking  Use low-heat cooking methods, such as baking, instead of high-heat cooking methods like deep frying.  Cook using healthy oils, such as olive, canola, or sunflower oil.  Avoid cooking with butter, cream, or high-fat meats. Meal planning  Eat meals and snacks regularly, preferably at the same times every day. Avoid going long periods of time without eating.  Eat foods high in fiber, such as fresh fruits, vegetables, beans, and whole grains. Talk to your dietitian about how many servings of carbs you can eat at each meal.  Eat 4-6 ounces (oz) of lean protein each day, such as lean meat, chicken, fish, eggs, or tofu. One oz of lean protein is equal to: ? 1 oz of meat, chicken, or fish. ? 1 egg. ?  cup of tofu.  Eat some foods each day that contain healthy fats, such as avocado, nuts, seeds, and fish. Lifestyle  Check your blood glucose regularly.  Exercise regularly as told by your health care provider. This may include: ? 150 minutes of moderate-intensity or vigorous-intensity exercise each week. This could be brisk walking, biking, or water aerobics. ? Stretching and doing strength exercises, such as yoga or weightlifting, at least 2 times a week.  Take medicines as told by your health care provider.  Do not use any  products that contain nicotine or tobacco, such as cigarettes and e-cigarettes. If you need help quitting, ask your health care provider.  Work with a Social worker or diabetes educator to identify strategies to manage stress and any emotional and social challenges. Questions to ask a health care provider  Do I need to meet with a diabetes educator?  Do I need to meet with a dietitian?  What number can I call if I have questions?  When are the best times to check my blood glucose? Where to find more information:  American Diabetes Association: diabetes.org  Academy of Nutrition and Dietetics: www.eatright.[employer] of Diabetes and Digestive and Kidney Diseases (NIH): [employer] Summary  A healthy meal plan will help you control your blood glucose and maintain a healthy lifestyle.  Working with a diet and nutrition  specialist (dietitian) can help you make a meal plan that is best for you.  Keep in mind that carbohydrates (carbs) and alcohol have immediate effects on your blood glucose levels. It is important to count carbs and to use alcohol carefully. This information is not intended to replace advice given to you by your health care provider. Make sure you discuss any questions you have with your health care provider. Document Revised: 03/11/2017 Document Reviewed: 05/03/2016 Elsevier Patient Education  2020 [employer].

## 2020-02-19 NOTE — Addendum Note (Signed)
Addended by: Jerrol Banana A on: 02/19/2020 10:04 AM   Modules accepted: Orders

## 2020-02-21 ENCOUNTER — Ambulatory Visit: Payer: HMO

## 2020-02-26 ENCOUNTER — Encounter (INDEPENDENT_AMBULATORY_CARE_PROVIDER_SITE_OTHER): Payer: Self-pay | Admitting: Ophthalmology

## 2020-02-26 ENCOUNTER — Other Ambulatory Visit: Payer: Self-pay

## 2020-02-26 ENCOUNTER — Ambulatory Visit (INDEPENDENT_AMBULATORY_CARE_PROVIDER_SITE_OTHER): Payer: HMO | Admitting: Ophthalmology

## 2020-02-26 DIAGNOSIS — E113312 Type 2 diabetes mellitus with moderate nonproliferative diabetic retinopathy with macular edema, left eye: Secondary | ICD-10-CM | POA: Diagnosis not present

## 2020-02-26 DIAGNOSIS — H35072 Retinal telangiectasis, left eye: Secondary | ICD-10-CM | POA: Diagnosis not present

## 2020-02-26 DIAGNOSIS — H35071 Retinal telangiectasis, right eye: Secondary | ICD-10-CM

## 2020-02-26 MED ORDER — AFLIBERCEPT 2MG/0.05ML IZ SOLN FOR KALEIDOSCOPE
2.0000 mg | INTRAVITREAL | Status: AC | PRN
  Administered 2020-02-26: 2 mg via INTRAVITREAL

## 2020-02-26 NOTE — Assessment & Plan Note (Signed)
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

## 2020-02-26 NOTE — Progress Notes (Signed)
02/26/2020     CHIEF COMPLAINT Patient presents for Retina Follow Up   HISTORY OF PRESENT ILLNESS: Whitney [name] is a 85 y.o. female who presents to the clinic today for:   HPI    Retina Follow Up    Patient presents with  Diabetic Retinopathy.  In left eye.  This started 9 weeks ago.  Severity is mild.  Duration of 9 weeks.  Since onset it is stable.          Comments    9 WK F/U OS, POSS EYLEA OS    Stable vision, no new F/F       Last edited by Nichola Sizer D on 02/26/2020  9:14 AM. (History)      Referring physician: [name], MD [place],  [place] [address]  HISTORICAL INFORMATION:   Selected notes from the MEDICAL RECORD NUMBER    Lab Results  Component Value Date   HGBA1C 7.5 (H) 09/04/2019     CURRENT MEDICATIONS: Current Outpatient Medications (Ophthalmic Drugs)  Medication Sig  . dorzolamide-timolol (COSOPT) 22.3-6.8 MG/ML ophthalmic solution Instill 1 drop in each eye twice a day  . DUREZOL 0.05 % EMUL  (Patient not taking: Reported on 02/18/2020)   No current facility-administered medications for this visit. (Ophthalmic Drugs)   Current Outpatient Medications (Other)  Medication Sig  . aspirin 81 MG tablet Take 1 tablet (81 mg total) by mouth daily.  . blood glucose meter kit and supplies KIT Use to test blood sugar up to 3 times daily. DX E11.9  . Calcium Carb-Cholecalciferol (CALCIUM-VITAMIN D) 500-200 MG-UNIT tablet Take 1 tablet by mouth 2 (two) times daily with a meal.  . diclofenac Sodium (VOLTAREN) 1 % GEL Apply 4 g topically 4 (four) times daily.  . empagliflozin (JARDIANCE) 25 MG TABS tablet Take 1 tablet (25 mg total) by mouth daily before breakfast.  . glimepiride (AMARYL) 4 MG tablet TAKE 1 TABLET EVERY DAY BEFORE BREAKFAST  . glucose blood (ONETOUCH VERIO) test strip Use to test blood sugar 3 times a day  . Lancet Devices (ACCU-CHEK SOFTCLIX) lancets Use to test blood sugar up to 3 times a day. DX  E11.9  . lisinopril-hydrochlorothiazide (ZESTORETIC) 20-12.5 MG tablet Take 1 tablet by mouth daily.  . metFORMIN (GLUCOPHAGE) 1000 MG tablet Take 1 tablet (1,000 mg total) by mouth 2 (two) times daily with a meal.  . Multiple Vitamin (MULTI VITAMIN DAILY PO) Take one by mouth daily  . pioglitazone (ACTOS) 30 MG tablet Take 1 tablet (30 mg total) by mouth daily. (Patient not taking: Reported on 02/18/2020)  . simvastatin (ZOCOR) 20 MG tablet Take 1 tablet (20 mg total) by mouth at bedtime.   No current facility-administered medications for this visit. (Other)      REVIEW OF SYSTEMS:    ALLERGIES No Known Allergies  PAST MEDICAL HISTORY Past Medical History:  Diagnosis Date  . Anxiety   . Baker's cyst   . Chest pain   . Diabetes mellitus type 2, uncontrolled ([place])   . Diabetes mellitus without complication ([place])   . Disc disease, degenerative, cervical   . Diverticulosis of colon   . DJD (degenerative joint disease) of knee   . Facial pain, atypical   . GERD (gastroesophageal reflux disease)   . Glaucoma   . Hypercholesteremia   . Hypertension   . Lumbar spondylosis   . Obesity   . Onychomycosis   . Osteopenia    Past Surgical  History:  Procedure Laterality Date  . right eye surgery  09/2010   retina in right eye  . VAGINAL HYSTERECTOMY  2002   Dr. Donny Pique repair    FAMILY HISTORY Family History  Problem Relation Age of Onset  . Diabetes Mother   . Colon cancer Neg Hx   . Stomach cancer Neg Hx     SOCIAL HISTORY Social History   Tobacco Use  . Smoking status: Never Smoker  . Smokeless tobacco: Never Used  Vaping Use  . Vaping Use: Never used  Substance Use Topics  . Alcohol use: No  . Drug use: No         OPHTHALMIC EXAM:  Base Eye Exam    Visual Acuity (ETDRS)      Right Left   Dist cc 20/25 -2 20/25 -2   Correction: Glasses       Tonometry (Tonopen, 9:20 AM)      Right Left   Pressure 10 9       Pupils      Pupils Dark Light  Shape React APD   Right PERRL 3 2 Irregular Brisk None   Left PERRL 3 2 Irregular Brisk None       Visual Fields (Counting fingers)      Left Right    Full Full       Extraocular Movement      Right Left    Full Full       Neuro/Psych    Oriented x3: Yes   Mood/Affect: Normal       Dilation    Left eye: 1.0% Mydriacyl, 2.5% Phenylephrine @ 9:20 AM        Slit Lamp and Fundus Exam    External Exam      Right Left   External Normal Normal       Slit Lamp Exam      Right Left   Lids/Lashes Normal Normal   Conjunctiva/Sclera White and quiet White and quiet   Cornea Clear Clear   Anterior Chamber Deep and quiet Deep and quiet   Iris Round and reactive Round and reactive   Lens Posterior chamber intraocular lens Posterior chamber intraocular lens   Anterior Vitreous Normal Normal       Fundus Exam      Right Left   Posterior Vitreous Normal Normal   Disc Normal Normal   C/D Ratio  0.7   Macula  Microaneurysms, Macular thickening, Cystoid macular edema, Mild clinically significant macular edema   Vessels  NPDR, moderate   Periphery Normal Normal          IMAGING AND PROCEDURES  Imaging and Procedures for 02/26/20  OCT, Retina - OU - Both Eyes       Right Eye Quality was good. Scan locations included subfoveal. Central Foveal Thickness: 270. Progression has improved. Findings include abnormal foveal contour, cystoid macular edema.   Left Eye Central Foveal Thickness: 239. Progression has improved. Findings include abnormal foveal contour, cystoid macular edema.   Notes CSME, CME much improved on combination antivegF therapy and nighttime CPAP use to prevent nighttime sleep apnea induced hypoxic damage       Intravitreal Injection, Pharmacologic Agent - OS - Left Eye       Time Out 02/26/2020. 10:04 AM. Confirmed correct patient, procedure, site, and patient consented.   Anesthesia Topical anesthesia was used. Anesthetic medications included  [place] 3.5%.   Procedure Preparation included Tobramycin 0.3%, 10% betadine to eyelids, 5% betadine to  ocular surface, Ofloxacin . A 30 gauge needle was used.   Injection:  2 mg aflibercept Alfonse Flavors) SOLN   NDC: [id number], Lot: [id number]   Route: Intravitreal, Site: Left Eye, Waste: 0 mg  Post-op Post injection exam found visual acuity of at least counting fingers. The patient tolerated the procedure well. There were no complications. The patient received written and verbal post procedure care education. Post injection medications were not given.                 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 Moderate nonproliferative diabetic retinopathy of left eye with macular edema associated with type 2 diabetes mellitus (HCC)  J00.9381 OCT, Retina - OU - Both Eyes    Intravitreal Injection, Pharmacologic Agent - OS - Left Eye    aflibercept (EYLEA) SOLN 2 mg  2. Type 2 macular telangiectasis, left  H35.072   3. Retinal telangiectasia of right eye  H35.071     1.  We'll repeat injection intravitreal Eylea OS today and extend interval of examination from 9 to now 10 weeks next visit  2.  Dilate OD next as scheduled possible injection Avastin next  3.  Ophthalmic  Meds Ordered this visit:  Meds ordered this encounter  Medications  . aflibercept (EYLEA) SOLN 2 mg       Return in about 10 weeks (around 05/06/2020) for dilate, OS, EYLEA OCT.  There are no Patient Instructions on file for this visit.   Explained the diagnoses, plan, and follow up with the patient and they expressed understanding.  Patient expressed understanding of the importance of proper follow up care.   Clent Demark Devinn Hurwitz M.D. Diseases & Surgery of the Retina and Vitreous Retina [facility] Diabetic [place] 02/26/20     Abbreviations: M myopia (nearsighted); A astigmatism; H hyperopia (farsighted); P presbyopia; Mrx spectacle prescription;  CTL contact lenses; OD right eye; OS left eye; OU both eyes  XT exotropia; ET esotropia; PEK punctate epithelial keratitis; PEE punctate epithelial erosions; DES dry eye syndrome; MGD meibomian gland dysfunction; ATs artificial tears; PFAT's preservative free artificial tears; [place] nuclear sclerotic cataract; PSC posterior subcapsular cataract; ERM epi-retinal membrane; PVD posterior vitreous detachment; RD retinal detachment; DM diabetes mellitus; DR diabetic retinopathy; NPDR non-proliferative diabetic retinopathy; PDR proliferative diabetic retinopathy; CSME clinically significant macular edema; DME diabetic macular edema; dbh dot blot hemorrhages; CWS cotton wool spot; POAG primary open angle glaucoma; C/D cup-to-disc ratio; HVF humphrey visual field; GVF goldmann visual field; OCT optical coherence tomography; IOP intraocular pressure; BRVO Branch retinal vein occlusion; CRVO central retinal vein occlusion; CRAO central retinal artery occlusion; BRAO branch retinal artery occlusion; RT retinal tear; SB scleral buckle; PPV pars plana vitrectomy; VH Vitreous hemorrhage; PRP panretinal laser photocoagulation; IVK intravitreal kenalog; VMT vitreomacular traction; MH Macular hole;  NVD neovascularization of the disc; NVE  neovascularization elsewhere; AREDS age  related eye disease study; ARMD age related macular degeneration; POAG primary open angle glaucoma; EBMD epithelial/anterior basement membrane dystrophy; ACIOL anterior chamber intraocular lens; IOL intraocular lens; PCIOL posterior chamber intraocular lens; Phaco/IOL phacoemulsification with intraocular lens placement; [place] photorefractive keratectomy; LASIK laser assisted in situ keratomileusis; HTN hypertension; DM diabetes mellitus; COPD chronic obstructive pulmonary disease

## 2020-02-26 NOTE — Assessment & Plan Note (Signed)
CSME OS results resistant in the past to antivegF therapy now improved in combination with nighttime CPAP use to prevent hypoxic damage  Currently at 9-week follow-up left eye, will repeat injection today and examination in 10 weeks

## 2020-03-10 ENCOUNTER — Other Ambulatory Visit: Payer: Self-pay

## 2020-03-10 NOTE — Patient Outreach (Signed)
  [facility]) Care Management Chronic Special Needs Program    03/10/2020  Name: Whitney [name], DOB: 1934/08/13  MRN: [id number]   Ms. [name] is enrolled in a chronic special needs plan. RNCM returned call to client. Client called stating, "I keep getting notices that I am eligible for this and I am eligible for that". Client reports she has received her [employer] insurance card for the year 2022 and wants to ensure she is getting what she is eligible for.   RNCM discussed over the counter benefit card. Client states she has been using, but is not clear how much she has left on the card. Warm transfer to [place] completed.  Plan: [place] Management will continue to provide services for this member through 04/11/2020. The [employer] Care Management Team will assume care 04/12/2020.  Thea Silversmith, RN, MSN, [place] [phone]

## 2020-03-17 ENCOUNTER — Ambulatory Visit (INDEPENDENT_AMBULATORY_CARE_PROVIDER_SITE_OTHER): Payer: HMO | Admitting: Internal Medicine

## 2020-03-17 ENCOUNTER — Other Ambulatory Visit: Payer: Self-pay

## 2020-03-17 ENCOUNTER — Encounter: Payer: Self-pay | Admitting: Internal Medicine

## 2020-03-17 VITALS — BP 168/80 | HR 62 | Temp 98.3°F | Ht 61.0 in | Wt 145.0 lb

## 2020-03-17 DIAGNOSIS — I10 Essential (primary) hypertension: Secondary | ICD-10-CM

## 2020-03-17 DIAGNOSIS — Z23 Encounter for immunization: Secondary | ICD-10-CM | POA: Diagnosis not present

## 2020-03-17 DIAGNOSIS — Z0000 Encounter for general adult medical examination without abnormal findings: Secondary | ICD-10-CM | POA: Diagnosis not present

## 2020-03-17 DIAGNOSIS — R413 Other amnesia: Secondary | ICD-10-CM | POA: Insufficient documentation

## 2020-03-17 DIAGNOSIS — E785 Hyperlipidemia, unspecified: Secondary | ICD-10-CM | POA: Diagnosis not present

## 2020-03-17 DIAGNOSIS — F09 Unspecified mental disorder due to known physiological condition: Secondary | ICD-10-CM | POA: Insufficient documentation

## 2020-03-17 DIAGNOSIS — E1169 Type 2 diabetes mellitus with other specified complication: Secondary | ICD-10-CM

## 2020-03-17 LAB — COMPREHENSIVE METABOLIC PANEL
ALT: 18 U/L (ref 0–35)
AST: 21 U/L (ref 0–37)
Albumin: 4.1 g/dL (ref 3.5–5.2)
Alkaline Phosphatase: 45 U/L (ref 39–117)
BUN: 11 mg/dL (ref 6–23)
CO2: 29 mEq/L (ref 19–32)
Calcium: 10.6 mg/dL — ABNORMAL HIGH (ref 8.4–10.5)
Chloride: 100 mEq/L (ref 96–112)
Creatinine, Ser: 0.81 mg/dL (ref 0.40–1.20)
GFR: 67.28 mL/min (ref >60.00)
Glucose, Bld: 272 mg/dL — ABNORMAL HIGH (ref 70–99)
Potassium: 3.8 mEq/L (ref 3.5–5.1)
Sodium: 138 mEq/L (ref 135–145)
Total Bilirubin: 1 mg/dL (ref 0.2–1.2)
Total Protein: 8 g/dL (ref 6.0–8.3)

## 2020-03-17 LAB — HEMOGLOBIN A1C: Hgb A1c MFr Bld: 7.9 % — ABNORMAL HIGH (ref 4.6–6.5)

## 2020-03-17 LAB — CBC
HCT: 39.7 % (ref 36.0–46.0)
Hemoglobin: 12.9 g/dL (ref 12.0–15.0)
MCHC: 32.6 g/dL (ref 30.0–36.0)
MCV: 79.1 fl (ref 78.0–100.0)
Platelets: 222 10*3/uL (ref 150.0–400.0)
RBC: 5.02 Mil/uL (ref 3.87–5.11)
RDW: 15.3 % (ref 11.5–15.5)
WBC: 5.4 10*3/uL (ref 4.0–10.5)

## 2020-03-17 LAB — LIPID PANEL
Cholesterol: 145 mg/dL (ref 0–200)
HDL: 40.5 mg/dL (ref >39.00)
LDL Cholesterol: 80 mg/dL (ref 0–99)
NonHDL: 104.85
Total CHOL/HDL Ratio: 4
Triglycerides: 124 mg/dL (ref 0.0–149.0)
VLDL: 24.8 mg/dL (ref 0.0–40.0)

## 2020-03-17 MED ORDER — EMPAGLIFLOZIN 25 MG PO TABS
25.0000 mg | ORAL_TABLET | Freq: Every day | ORAL | 3 refills | Status: DC
Start: 2020-03-17 — End: 2020-08-07

## 2020-03-17 NOTE — Patient Instructions (Addendum)
We will check the labs today and get the scan of the brain.  Health Maintenance, Female Adopting a healthy lifestyle and getting preventive care are important in promoting health and wellness. Ask your health care provider about:  The right schedule for you to have regular tests and exams.  Things you can do on your own to prevent diseases and keep yourself healthy. What should I know about diet, weight, and exercise? Eat a healthy diet   Eat a diet that includes plenty of vegetables, fruits, low-fat dairy products, and lean protein.  Do not eat a lot of foods that are high in solid fats, added sugars, or sodium. Maintain a healthy weight Body mass index (BMI) is used to identify weight problems. It estimates body fat based on height and weight. Your health care provider can help determine your BMI and help you achieve or maintain a healthy weight. Get regular exercise Get regular exercise. This is one of the most important things you can do for your health. Most adults should:  Exercise for at least 150 minutes each week. The exercise should increase your heart rate and make you sweat (moderate-intensity exercise).  Do strengthening exercises at least twice a week. This is in addition to the moderate-intensity exercise.  Spend less time sitting. Even light physical activity can be beneficial. Watch cholesterol and blood lipids Have your blood tested for lipids and cholesterol at 83 years of age, then have this test every 5 years. Have your cholesterol levels checked more often if:  Your lipid or cholesterol levels are high.  You are older than 83 years of age.  You are at high risk for heart disease. What should I know about cancer screening? Depending on your health history and family history, you may need to have cancer screening at various ages. This may include screening for:  Breast cancer.  Cervical cancer.  Colorectal cancer.  Skin cancer.  Lung cancer. What  should I know about heart disease, diabetes, and high blood pressure? Blood pressure and heart disease  High blood pressure causes heart disease and increases the risk of stroke. This is more likely to develop in people who have high blood pressure readings, are of African descent, or are overweight.  Have your blood pressure checked: ? Every 3-5 years if you are 83-83 years of age. ? Every year if you are 83 years old or older. Diabetes Have regular diabetes screenings. This checks your fasting blood sugar level. Have the screening done:  Once every three years after age 83 if you are at a normal weight and have a low risk for diabetes.  More often and at a younger age if you are overweight or have a high risk for diabetes. What should I know about preventing infection? Hepatitis B If you have a higher risk for hepatitis B, you should be screened for this virus. Talk with your health care provider to find out if you are at risk for hepatitis B infection. Hepatitis C Testing is recommended for:  Everyone born from [birth year] through 1965.  Anyone with known risk factors for hepatitis C. Sexually transmitted infections (STIs)  Get screened for STIs, including gonorrhea and chlamydia, if: ? You are sexually active and are younger than 83 years of age. ? You are older than 83 years of age and your health care provider tells you that you are at risk for this type of infection. ? Your sexual activity has changed since you were last screened, and you  are at increased risk for chlamydia or gonorrhea. Ask your health care provider if you are at risk.  Ask your health care provider about whether you are at high risk for HIV. Your health care provider may recommend a prescription medicine to help prevent HIV infection. If you choose to take medicine to prevent HIV, you should first get tested for HIV. You should then be tested every 3 months for as long as you are taking the medicine. Pregnancy  If  you are about to stop having your period (premenopausal) and you may become pregnant, seek counseling before you get pregnant.  Take 400 to 800 micrograms (mcg) of folic acid every day if you become pregnant.  Ask for birth control (contraception) if you want to prevent pregnancy. Osteoporosis and menopause Osteoporosis is a disease in which the bones lose minerals and strength with aging. This can result in bone fractures. If you are 20 years old or older, or if you are at risk for osteoporosis and fractures, ask your health care provider if you should:  Be screened for bone loss.  Take a calcium or vitamin D supplement to lower your risk of fractures.  Be given hormone replacement therapy (HRT) to treat symptoms of menopause. Follow these instructions at home: Lifestyle  Do not use any products that contain nicotine or tobacco, such as cigarettes, e-cigarettes, and chewing tobacco. If you need help quitting, ask your health care provider.  Do not use street drugs.  Do not share needles.  Ask your health care provider for help if you need support or information about quitting drugs. Alcohol use  Do not drink alcohol if: ? Your health care provider tells you not to drink. ? You are pregnant, may be pregnant, or are planning to become pregnant.  If you drink alcohol: ? Limit how much you use to 0-1 drink a day. ? Limit intake if you are breastfeeding.  Be aware of how much alcohol is in your drink. In the [place] one drink equals one 12 oz bottle of beer (355 mL), one 5 oz glass of wine (148 mL), or one 1 oz glass of hard liquor (44 mL). General instructions  Schedule regular health, dental, and eye exams.  Stay current with your vaccines.  Tell your health care provider if: ? You often feel depressed. ? You have ever been abused or do not feel safe at home. Summary  Adopting a healthy lifestyle and getting preventive care are important in promoting health and  wellness.  Follow your health care provider's instructions about healthy diet, exercising, and getting tested or screened for diseases.  Follow your health care provider's instructions on monitoring your cholesterol and blood pressure. This information is not intended to replace advice given to you by your health care provider. Make sure you discuss any questions you have with your health care provider. Document Revised: 03/22/2018 Document Reviewed: 03/22/2018 Elsevier Patient Education  2020 [employer].

## 2020-03-17 NOTE — Assessment & Plan Note (Addendum)
Checking HgA1c. Taking amaryl and metformin and jardiance. Adjust as needed. Foot exam done. Eye exam up to date but with changes. On ACE-I and statin.

## 2020-03-17 NOTE — Progress Notes (Signed)
Subjective:   Patient ID: Whitney [name], female    DOB: May 26, 1934, 85 y.o.   MRN: [id number]  HPI Here for medicare wellness and physical, no new complaints. Please see A/P for status and treatment of chronic medical problems.   Diet: DM since diabetic Physical activity: sedentary Depression/mood screen: negative Hearing: intact to whispered voice with left hearing aid, moderate loss right ear Visual acuity: not normal, performs annual eye exam, changes from diabetes ADLs: capable Fall risk: low Home safety: good Cognitive evaluation: intact to orientation, naming, recall and repetition EOL planning: adv directives discussed    Patient Outreach Telephone from 10/08/2019 in [facility]  PHQ-2 Total Score 0        Clinical Support from 04/21/2017 in [facility]  PHQ-9 Total Score 0      I have personally reviewed and have noted 1. The patient's medical and social history - reviewed today no changes 2. Their use of alcohol, tobacco or illicit drugs 3. Their current medications and supplements 4. The patient's functional ability including ADL's, fall risks, home safety risks and hearing or visual impairment. 5. Diet and physical activities 6. Evidence for depression or mood disorders 7. Care team reviewed and updated  Patient Care Team: [name], MD as PCP - General (Internal Medicine) Noralee Space, MD as Consulting Physician (Pulmonary Disease) Gean Birchwood, DPM as Consulting Physician (Podiatry) Luretha Rued, RN as [place] [facility] [place], [place], [facility] as Pharmacist (Pharmacist) Past Medical History:  Diagnosis Date  . Anxiety   . Baker's cyst   . Chest pain   . Diabetes mellitus type 2, uncontrolled ([place])   . Diabetes mellitus without complication ([place])   . Disc disease, degenerative, cervical   . Diverticulosis of colon   . DJD (degenerative joint disease) of knee   .  Facial pain, atypical   . GERD (gastroesophageal reflux disease)   . Glaucoma   . Hypercholesteremia   . Hypertension   . Lumbar spondylosis   . Obesity   . Onychomycosis   . Osteopenia    Past Surgical History:  Procedure Laterality Date  . right eye surgery  09/2010   retina in right eye  . VAGINAL HYSTERECTOMY  2002   Dr. Kem Kays repair   Family History  Problem Relation Age of Onset  . Diabetes Mother   . Colon cancer Neg Hx   . Stomach cancer Neg Hx    Review of Systems  Constitutional: Negative.   HENT: Negative.   Eyes: Negative.   Respiratory: Negative for cough, chest tightness and shortness of breath.   Cardiovascular: Negative for chest pain, palpitations and leg swelling.  Gastrointestinal: Negative for abdominal distention, abdominal pain, constipation, diarrhea, nausea and vomiting.  Musculoskeletal: Negative.   Skin: Negative.   Neurological: Negative.   Psychiatric/Behavioral: Negative.     Objective:  Physical Exam Constitutional:      Appearance: She is well-developed.  HENT:     Head: Normocephalic and atraumatic.  Cardiovascular:     Rate and Rhythm: Normal rate and regular rhythm.  Pulmonary:     Effort: Pulmonary effort is normal. No respiratory distress.     Breath sounds: Normal breath sounds. No wheezing or rales.  Abdominal:     General: Bowel sounds are normal. There is no distension.     Palpations: Abdomen is soft.     Tenderness: There is no abdominal tenderness. There is no rebound.  Musculoskeletal:     Cervical back: Normal range of motion.  Skin:    General: Skin is warm and dry.  Neurological:     Mental Status: She is alert and oriented to person, place, and time.     Coordination: Coordination normal.     Vitals:   03/17/20 0912  BP: (!) 168/80  Pulse: 62  Temp: 98.3 F (36.8 C)  TempSrc: Oral  SpO2: 97%  Weight: 145 lb (65.8 kg)  Height: 5\' 1"  (1.549 m)   This visit occurred during the SARS-CoV-2 public  health emergency.  Safety protocols were in place, including screening questions prior to the visit, additional usage of staff PPE, and extensive cleaning of exam room while observing appropriate contact time as indicated for disinfecting solutions.   Assessment & Plan:  Flu shot given at visit

## 2020-03-17 NOTE — Assessment & Plan Note (Signed)
Checking MRI as no recent imaging. Checking labs also.

## 2020-03-17 NOTE — Assessment & Plan Note (Signed)
Checking CMP and adjust lisinopril/hctz as needed. BP typically at goal but moderately elevated today without medications prior to visit. She will monitor at home and let us know if elevated.

## 2020-03-17 NOTE — Assessment & Plan Note (Signed)
Checking lipid panel and adjust simvastatin as needed.  

## 2020-03-17 NOTE — Assessment & Plan Note (Signed)
Flu shot given. Covid-19 up to date including booster. Pneumonia complete. Shingrix counseled. Tetanus due 2023. Colonoscopy aged out. Mammogram aged out, pap smear aged out and dexa declines further. Counseled about sun safety and mole surveillance. Counseled about the dangers of distracted driving. Given 10 year screening recommendations.

## 2020-03-18 ENCOUNTER — Ambulatory Visit (INDEPENDENT_AMBULATORY_CARE_PROVIDER_SITE_OTHER): Payer: HMO | Admitting: Ophthalmology

## 2020-03-18 ENCOUNTER — Encounter (INDEPENDENT_AMBULATORY_CARE_PROVIDER_SITE_OTHER): Payer: Self-pay | Admitting: Ophthalmology

## 2020-03-18 DIAGNOSIS — G4733 Obstructive sleep apnea (adult) (pediatric): Secondary | ICD-10-CM | POA: Diagnosis not present

## 2020-03-18 DIAGNOSIS — E113311 Type 2 diabetes mellitus with moderate nonproliferative diabetic retinopathy with macular edema, right eye: Secondary | ICD-10-CM | POA: Diagnosis not present

## 2020-03-18 DIAGNOSIS — H35071 Retinal telangiectasis, right eye: Secondary | ICD-10-CM | POA: Diagnosis not present

## 2020-03-18 DIAGNOSIS — Z9989 Dependence on other enabling machines and devices: Secondary | ICD-10-CM | POA: Diagnosis not present

## 2020-03-18 MED ORDER — BEVACIZUMAB 2.5 MG/0.1ML IZ SOSY
2.5000 mg | PREFILLED_SYRINGE | INTRAVITREAL | Status: AC | PRN
  Administered 2020-03-18: 2.5 mg via INTRAVITREAL

## 2020-03-18 NOTE — Assessment & Plan Note (Signed)
Patient continues on excellent compliance CPAP, this assist macular oxygenation with diabetic eye disease

## 2020-03-18 NOTE — Progress Notes (Addendum)
03/18/2020     CHIEF COMPLAINT Patient presents for Retina Follow Up   HISTORY OF PRESENT ILLNESS: Whitney [name] is a 85 y.o. female who presents to the clinic today for:   HPI    Retina Follow Up    Patient presents with  Diabetic Retinopathy.  In right eye.  Severity is moderate.  Duration of 3 months.  Since onset it is stable.  I, the attending physician,  performed the HPI with the patient and updated documentation appropriately.          Comments    3 month NPDR f\u OD. Possible Avastin OD. OCT  Pt states no changes or issues. Pt still using CPAP. BGL: 156 this AM A1C: 7.9 yesterday       Last edited by Tilda Franco on 03/18/2020  9:20 AM. (History)      Referring physician: [name], MD [place],  [place] [address]  HISTORICAL INFORMATION:   Selected notes from the MEDICAL RECORD NUMBER    Lab Results  Component Value Date   HGBA1C 7.9 (H) 03/17/2020     CURRENT MEDICATIONS: Current Outpatient Medications (Ophthalmic Drugs)  Medication Sig  . dorzolamide-timolol (COSOPT) 22.3-6.8 MG/ML ophthalmic solution Instill 1 drop in each eye twice a day  . DUREZOL 0.05 % EMUL    No current facility-administered medications for this visit. (Ophthalmic Drugs)   Current Outpatient Medications (Other)  Medication Sig  . aspirin 81 MG tablet Take 1 tablet (81 mg total) by mouth daily.  . blood glucose meter kit and supplies KIT Use to test blood sugar up to 3 times daily. DX E11.9  . Calcium Carb-Cholecalciferol (CALCIUM-VITAMIN D) 500-200 MG-UNIT tablet Take 1 tablet by mouth 2 (two) times daily with a meal.  . diclofenac Sodium (VOLTAREN) 1 % GEL Apply 4 g topically 4 (four) times daily.  . empagliflozin (JARDIANCE) 25 MG TABS tablet Take 1 tablet (25 mg total) by mouth daily before breakfast.  . glimepiride (AMARYL) 4 MG tablet TAKE 1 TABLET EVERY DAY BEFORE BREAKFAST  . glucose blood (ONETOUCH VERIO) test strip Use to test  blood sugar 3 times a day  . Lancet Devices (ACCU-CHEK SOFTCLIX) lancets Use to test blood sugar up to 3 times a day. DX E11.9  . lisinopril-hydrochlorothiazide (ZESTORETIC) 20-12.5 MG tablet Take 1 tablet by mouth daily.  . metFORMIN (GLUCOPHAGE) 1000 MG tablet Take 1 tablet (1,000 mg total) by mouth 2 (two) times daily with a meal.  . Multiple Vitamin (MULTI VITAMIN DAILY PO) Take one by mouth daily  . simvastatin (ZOCOR) 20 MG tablet Take 1 tablet (20 mg total) by mouth at bedtime.   No current facility-administered medications for this visit. (Other)      REVIEW OF SYSTEMS: ROS    Positive for: Endocrine   Last edited by Tilda Franco on 03/18/2020  9:20 AM. (History)       ALLERGIES No Known Allergies  PAST MEDICAL HISTORY Past Medical History:  Diagnosis Date  . Anxiety   . Baker's cyst   . Chest pain   . Diabetes mellitus type 2, uncontrolled ([place])   . Diabetes mellitus without complication ([place])   . Disc disease, degenerative, cervical   . Diverticulosis of colon   . DJD (degenerative joint disease) of knee   . Facial pain, atypical   . GERD (gastroesophageal reflux disease)   . Glaucoma   . Hypercholesteremia   . Hypertension   . Lumbar  spondylosis   . Obesity   . Onychomycosis   . Osteopenia    Past Surgical History:  Procedure Laterality Date  . right eye surgery  09/2010   retina in right eye  . VAGINAL HYSTERECTOMY  2002   Dr. Kem Kays repair    FAMILY HISTORY Family History  Problem Relation Age of Onset  . Diabetes Mother   . Colon cancer Neg Hx   . Stomach cancer Neg Hx     SOCIAL HISTORY Social History   Tobacco Use  . Smoking status: Never Smoker  . Smokeless tobacco: Never Used  Vaping Use  . Vaping Use: Never used  Substance Use Topics  . Alcohol use: No  . Drug use: No         OPHTHALMIC EXAM: Base Eye Exam    Visual Acuity (Snellen - Linear)      Right Left   Dist cc 20/30 -2 20/25   Correction: Glasses         Tonometry (Tonopen, 9:26 AM)      Right Left   Pressure 15 14       Pupils      Pupils Dark Light Shape React APD   Right PERRL 3 2 Irregular Brisk None   Left PERRL 3 2 Irregular Brisk None       Visual Fields (Counting fingers)      Left Right    Full Full       Neuro/Psych    Oriented x3: Yes   Mood/Affect: Normal       Dilation    Right eye: 1.0% Mydriacyl, 2.5% Phenylephrine @ 9:26 AM        Slit Lamp and Fundus Exam    External Exam      Right Left   External Normal Normal       Slit Lamp Exam      Right Left   Lids/Lashes Normal Normal   Conjunctiva/Sclera White and quiet White and quiet   Cornea Clear Clear   Anterior Chamber Deep and quiet Deep and quiet   Iris Round and reactive Round and reactive   Lens Posterior chamber intraocular lens Posterior chamber intraocular lens   Anterior Vitreous Normal Normal       Fundus Exam      Right Left   Posterior Vitreous Normal    Disc Normal    C/D Ratio 0.55    Macula Microaneurysms,, no clinical CSME    Vessels NPDR, moderate    Periphery Normal           IMAGING AND PROCEDURES  Imaging and Procedures for 03/18/20  OCT, Retina - OU - Both Eyes       Right Eye Quality was good. Scan locations included subfoveal. Central Foveal Thickness: 301.   Left Eye Central Foveal Thickness: 226.   Notes Much less CSME overall OD, now on 59-monthfollow-up examination, still some activity yet we will repeat intravitreal Avastin OD today   OS currently at 3 weeks post intravitreal Avastin, less CSME present        Intravitreal Injection, Pharmacologic Agent - OD - Right Eye       Time Out 03/18/2020. 10:23 AM. Confirmed correct patient, procedure, site, and patient consented.   Anesthesia Topical anesthesia was used. Anesthetic medications included Akten 3.5%.   Procedure Preparation included 10% betadine to eyelids, 5% betadine to ocular surface, Tobramycin 0.3%. A 30 gauge needle was  used.   Injection:  2.5 mg Bevacizumab (  AVASTIN) 2.63m/0.1mL SOSY   NDC:: 79038-333-83 Lot: [id number]  Route: Intravitreal, Site: Right Eye  Post-op Post injection exam found visual acuity of at least counting fingers. The patient tolerated the procedure well. There were no complications. The patient received written and verbal post procedure care education. Post injection medications were not given.                 ASSESSMENT/PLAN:  OSA on CPAP Patient continues on excellent compliance CPAP, this assist macular oxygenation with diabetic eye disease  Moderate nonproliferative diabetic retinopathy of right eye with macular edema (HCC) Diabetic macular edema OD much improved and now sensitive to antivegF that patient is using treatment for nighttime hypoxemia from sleep apnea  Retinal telangiectasia of right eye Much improved less active on CPAP use continue      ICD-10-CM   1. Moderate nonproliferative diabetic retinopathy of right eye with macular edema associated with type 2 diabetes mellitus (HCC)  E11.3311 OCT, Retina - OU - Both Eyes    Intravitreal Injection, Pharmacologic Agent - OD - Right Eye    bevacizumab (AVASTIN) SOSY 2.5 mg  2. OSA on CPAP  G47.33    Z99.89   3. Retinal telangiectasia of right eye  H35.071     1.  OD, center involvement much less active now on antivegF only every 3 months, likely due to the fact patient uses nightly CPAP, which minimizes nightly hypoxemic damage to the macula OU 2.  Dilate OS next as scheduled  3.  Ophthalmic Meds Ordered this visit:  Meds ordered this encounter  Medications  . bevacizumab (AVASTIN) SOSY 2.5 mg       Return in about 3 months (around 06/16/2020) for dilate, OD, AVASTIN OCT.  There are no Patient Instructions on file for this visit.   Explained the diagnoses, plan, and follow up with the patient and they expressed understanding.  Patient expressed understanding of the importance of proper follow up  care.   GClent DemarkRankin M.D. Diseases & Surgery of the Retina and Vitreous Retina & Diabetic ERegal12/07/21     Abbreviations: M myopia (nearsighted); A astigmatism; H hyperopia (farsighted); P presbyopia; Mrx spectacle prescription;  CTL contact lenses; OD right eye; OS left eye; OU both eyes  XT exotropia; ET esotropia; PEK punctate epithelial keratitis; PEE punctate epithelial erosions; DES dry eye syndrome; MGD meibomian gland dysfunction; ATs artificial tears; PFAT's preservative free artificial tears; [place] sclerotic cataract; PSC posterior subcapsular cataract; ERM epi-retinal membrane; PVD posterior vitreous detachment; RD retinal detachment; DM diabetes mellitus; DR diabetic retinopathy; NPDR non-proliferative diabetic retinopathy; PDR proliferative diabetic retinopathy; CSME clinically significant macular edema; DME diabetic macular edema; dbh dot blot hemorrhages; CWS cotton wool spot; POAG primary open angle glaucoma; C/D cup-to-disc ratio; HVF humphrey visual field; GVF goldmann visual field; OCT optical coherence tomography; IOP intraocular pressure; BRVO Branch retinal vein occlusion; CRVO central retinal vein occlusion; CRAO central retinal artery occlusion; BRAO branch retinal artery occlusion; RT retinal tear; SB scleral buckle; PPV pars plana vitrectomy; VH Vitreous hemorrhage; PRP panretinal laser photocoagulation; IVK intravitreal kenalog; VMT vitreomacular traction; MH Macular hole;  NVD neovascularization of the disc; NVE neovascularization elsewhere; AREDS age related eye disease study; ARMD age related macular degeneration; POAG primary open angle glaucoma; EBMD epithelial/anterior basement membrane dystrophy; ACIOL anterior chamber intraocular lens; IOL intraocular lens; PCIOL posterior chamber intraocular lens; Phaco/IOL phacoemulsification with intraocular lens placement; [place]qu[place] Valleyphotorefractive keratectomy; LASIK laser assisted in situ keratomileusis; HTN hypertension;  DM diabetes mellitus;  COPD chronic obstructive pulmonary disease

## 2020-03-18 NOTE — Assessment & Plan Note (Signed)
Diabetic macular edema OD much improved and now sensitive to antivegF that patient is using treatment for nighttime hypoxemia from sleep apnea

## 2020-03-18 NOTE — Assessment & Plan Note (Signed)
Much improved less active on CPAP use continue

## 2020-03-20 ENCOUNTER — Telehealth: Payer: Self-pay | Admitting: Internal Medicine

## 2020-03-20 NOTE — Telephone Encounter (Signed)
    Please return call to patient with lab results

## 2020-03-20 NOTE — Telephone Encounter (Signed)
Pt informed of below.    "Labs are normal and no changes needed."

## 2020-03-31 ENCOUNTER — Encounter: Payer: Self-pay | Admitting: Internal Medicine

## 2020-04-01 ENCOUNTER — Ambulatory Visit
Admission: RE | Admit: 2020-04-01 | Discharge: 2020-04-01 | Disposition: A | Discharge status: Home / Self Care | Payer: HMO | Source: Ambulatory Visit | Attending: Internal Medicine | Admitting: Internal Medicine

## 2020-04-01 ENCOUNTER — Other Ambulatory Visit: Payer: Self-pay

## 2020-04-01 DIAGNOSIS — Z1231 Encounter for screening mammogram for malignant neoplasm of breast: Secondary | ICD-10-CM | POA: Diagnosis not present

## 2020-04-16 ENCOUNTER — Other Ambulatory Visit: Payer: Self-pay

## 2020-04-17 ENCOUNTER — Ambulatory Visit: Payer: HMO | Admitting: Pharmacist

## 2020-04-17 ENCOUNTER — Other Ambulatory Visit: Payer: Self-pay

## 2020-04-17 DIAGNOSIS — I10 Essential (primary) hypertension: Secondary | ICD-10-CM

## 2020-04-17 DIAGNOSIS — E1169 Type 2 diabetes mellitus with other specified complication: Secondary | ICD-10-CM

## 2020-04-17 DIAGNOSIS — E785 Hyperlipidemia, unspecified: Secondary | ICD-10-CM

## 2020-04-17 NOTE — Patient Instructions (Signed)
Visit Information  Phone number for Pharmacist: [phone]  Goals Addressed            This Visit's Progress   . Pharmacy Care Plan       CARE PLAN ENTRY (see longitudinal plan of care for additional care plan information)  Current Barriers:  . Chronic Disease Management support, education, and care coordination needs related to Hypertension, Hyperlipidemia, and Diabetes   Hypertension BP Readings from Last 3 Encounters:  03/17/20 (!) 168/80  12/14/19 130/70  09/28/19 124/80 .  Pharmacist Clinical Goal(s): o Over the next 180 days, patient will work with PharmD and providers to maintain BP goal <140/90 . Current regimen:  o Lisinopril-HCTZ 20-12.5 mg daily . Interventions: o Discussed BP goals and benefits of medications for prevention of heart attack / stroke . Patient self care activities - Over the next 180 days, patient will: o Check BP daily, document, and provide at future appointments o Ensure daily salt intake < 2300 mg/day  Hyperlipidemia Lab Results  Component Value Date/Time   LDLCALC 80 03/17/2020 09:50 AM .  Pharmacist Clinical Goal(s): o Over the next 180 days, patient will work with PharmD and providers to maintain LDL goal < 100 . Current regimen:  o Simvastatin 20 mg at bedtime . Interventions: o Discussed cholesterol goals and benefits of medications for prevention of heart attack / stroke . Patient self care activities - Over the next 180 days, patient will: o Continue current medications  Diabetes Lab Results  Component Value Date/Time   HGBA1C 7.9 (H) 03/17/2020 09:50 AM   HGBA1C 7.5 (H) 09/04/2019 02:40 PM .  Pharmacist Clinical Goal(s): o Over the next 180 days, patient will work with PharmD and providers to achieve A1c goal <8% . Current regimen:  . Metformin 1000 mg twice a day . Glimepiride 4 mg daily  . Jardiance 25 mg daily . Interventions: o Discussed importance of maintaining sugars at goal to prevent complications of diabetes  including kidney damage, retinal damage, and cardiovascular disease o Discussed glimepiride loses efficacy over 5-10 years; may consider Januvia to achieve A1c closer to 7% . Patient self care activities - Over the next 180 days, patient will: o Check blood sugar once daily, document, and provide at future appointments o Contact provider with any episodes of hypoglycemia  Medication management . Pharmacist Clinical Goal(s): o Over the next 180 days, patient will work with PharmD and providers to maintain optimal medication adherence . Current pharmacy: [facility] order . Interventions o Comprehensive medication review performed. o Continue current medication management strategy . Patient self care activities - Over the next 180 days, patient will: o Focus on medication adherence by pill box o Take medications as prescribed o Report any questions or concerns to PharmD and/or provider(s)  Please see past updates related to this goal by clicking on the "Past Updates" button in the selected goal       The patient verbalized understanding of instructions, educational materials, and care plan provided today and declined offer to receive copy of patient instructions, educational materials, and care plan.  Telephone follow up appointment with pharmacy team member scheduled for: 6 months  Al Corpus, PharmD, [facility] Clinical Pharmacist [facility] [phone]

## 2020-04-17 NOTE — Chronic Care Management (AMB) (Signed)
Chronic Care Management Pharmacy  Name: Whitney [name]  MRN: [id number] DOB: 07-09-1934   Chief Complaint/ HPI  Whitney [name],  86 y.o. , female presents for their Follow-Up CCM visit with the clinical pharmacist via telephone due to COVID-19 Pandemic.  PCP : [name], MD Patient Care Team: [name], MD as PCP - General (Internal Medicine) Noralee Space, MD as Consulting Physician (Pulmonary Disease) Gean Birchwood, DPM as Consulting Physician (Podiatry) Charlton Haws, [facility] as Pharmacist (Pharmacist)  Their chronic conditions include: Hypertension, Hyperlipidemia, Diabetes, GERD and Osteopenia, Glaucoma, OSA  Pt has lived here her whole life, she has 4 children, 4 grandchildren, 3 great-grandchildren. Patient lives alone; she takes care of house work.   Office Visits: 09/28/19 Dr Sharlet Salina OV: f/u for DM. Fasting BG 188. Stop pioglitazone and start Jardiance 25 mg  09/04/19 Dr Sharlet Salina OV: f/u for neck/shoulder pain, ordered prednisone x 5 days  Consult Visit: 12/25/19 Dr Zadie Rhine (ophthalmology): f/u for macular telangiectasis  09/20/19 DrSchumann (cardiology): referred for abnormal EKG, ordered ECHO. BP elevated in clinic but normal at home, no changes.  No Known Allergies  Medications: Outpatient Encounter Medications as of 04/17/2020  Medication Sig Note  . aspirin 81 MG tablet Take 1 tablet (81 mg total) by mouth daily.   . blood glucose meter kit and supplies KIT Use to test blood sugar up to 3 times daily. DX E11.9   . Calcium Carb-Cholecalciferol (CALCIUM-VITAMIN D) 500-200 MG-UNIT tablet Take 1 tablet by mouth 2 (two) times daily with a meal.   . diclofenac Sodium (VOLTAREN) 1 % GEL Apply 4 g topically 4 (four) times daily.   . dorzolamide-timolol (COSOPT) 22.3-6.8 MG/ML ophthalmic solution Instill 1 drop in each eye twice a day   . DUREZOL 0.05 % EMUL  03/17/2015: Received from: External Pharmacy  . empagliflozin (JARDIANCE) 25  MG TABS tablet Take 1 tablet (25 mg total) by mouth daily before breakfast.   . glimepiride (AMARYL) 4 MG tablet TAKE 1 TABLET EVERY DAY BEFORE BREAKFAST   . glucose blood (ONETOUCH VERIO) test strip Use to test blood sugar 3 times a day   . Lancet Devices (ACCU-CHEK SOFTCLIX) lancets Use to test blood sugar up to 3 times a day. DX E11.9   . lisinopril-hydrochlorothiazide (ZESTORETIC) 20-12.5 MG tablet Take 1 tablet by mouth daily.   . metFORMIN (GLUCOPHAGE) 1000 MG tablet Take 1 tablet (1,000 mg total) by mouth 2 (two) times daily with a meal.   . Multiple Vitamin (MULTI VITAMIN DAILY PO) Take one by mouth daily 06/19/2013: Received from: External Pharmacy  . simvastatin (ZOCOR) 20 MG tablet Take 1 tablet (20 mg total) by mouth at bedtime.    No facility-administered encounter medications on file as of 04/17/2020.    Wt Readings from Last 3 Encounters:  03/17/20 145 lb (65.8 kg)  12/14/19 156 lb (70.8 kg)  09/28/19 162 lb (73.5 kg)   Lab Results  Component Value Date   CREATININE 0.81 03/17/2020   BUN 11 03/17/2020   GFR 67.28 03/17/2020   GFRNONAA 87.17 02/16/2010   GFRAA 156 02/19/2008   NA 138 03/17/2020   K 3.8 03/17/2020   CALCIUM 10.6 (H) 03/17/2020   CO2 29 03/17/2020    Current Diagnosis/Assessment:    Goals Addressed            This Visit's Progress   . Pharmacy Care Plan       CARE PLAN ENTRY (see longitudinal plan of care  for additional care plan information)  Current Barriers:  . Chronic Disease Management support, education, and care coordination needs related to Hypertension, Hyperlipidemia, and Diabetes   Hypertension BP Readings from Last 3 Encounters:  03/17/20 (!) 168/80  12/14/19 130/70  09/28/19 124/80 .  Pharmacist Clinical Goal(s): o Over the next 180 days, patient will work with PharmD and providers to maintain BP goal <140/90 . Current regimen:  o Lisinopril-HCTZ 20-12.5 mg daily . Interventions: o Discussed BP goals and benefits of  medications for prevention of heart attack / stroke . Patient self care activities - Over the next 180 days, patient will: o Check BP daily, document, and provide at future appointments o Ensure daily salt intake < 2300 mg/day  Hyperlipidemia Lab Results  Component Value Date/Time   LDLCALC 80 03/17/2020 09:50 AM .  Pharmacist Clinical Goal(s): o Over the next 180 days, patient will work with PharmD and providers to maintain LDL goal < 100 . Current regimen:  o Simvastatin 20 mg at bedtime . Interventions: o Discussed cholesterol goals and benefits of medications for prevention of heart attack / stroke . Patient self care activities - Over the next 180 days, patient will: o Continue current medications  Diabetes Lab Results  Component Value Date/Time   HGBA1C 7.9 (H) 03/17/2020 09:50 AM   HGBA1C 7.5 (H) 09/04/2019 02:40 PM .  Pharmacist Clinical Goal(s): o Over the next 180 days, patient will work with PharmD and providers to achieve A1c goal <8% . Current regimen:  . Metformin 1000 mg twice a day . Glimepiride 4 mg daily  . Jardiance 25 mg daily . Interventions: o Discussed importance of maintaining sugars at goal to prevent complications of diabetes including kidney damage, retinal damage, and cardiovascular disease o Discussed glimepiride loses efficacy over 5-10 years; may consider Januvia to achieve A1c closer to 7% . Patient self care activities - Over the next 180 days, patient will: o Check blood sugar once daily, document, and provide at future appointments o Contact provider with any episodes of hypoglycemia  Medication management . Pharmacist Clinical Goal(s): o Over the next 180 days, patient will work with PharmD and providers to maintain optimal medication adherence . Current pharmacy: [facility] order . Interventions o Comprehensive medication review performed. o Continue current medication management strategy . Patient self care activities - Over the next  180 days, patient will: o Focus on medication adherence by pill box o Take medications as prescribed o Report any questions or concerns to PharmD and/or provider(s)  Please see past updates related to this goal by clicking on the "Past Updates" button in the selected goal        Hypertension   BP goal is:  <140/90  Office blood pressures are  BP Readings from Last 3 Encounters:  03/17/20 (!) 168/80  12/14/19 130/70  09/28/19 124/80   Patient checks BP at home daily Patient home BP readings are ranging: 130-150/50-60s  Patient has failed these meds in the past: n/a Patient is currently controlled on the following medications:  . Lisinopril-HCTZ 20-12.5 mg daily AM  We discussed diet and exercise extensively; pt reports compliance with medication; BP at home is generally controlled with some excursions to 150s  Plan  Continue current medications    Hyperlipidemia   LDL goal < 100  Last lipids Lab Results  Component Value Date   CHOL 145 03/17/2020   HDL 40.50 03/17/2020   LDLCALC 80 03/17/2020   TRIG 124.0 03/17/2020   CHOLHDL 4  03/17/2020   Hepatic Function Latest Ref Rng & Units 03/17/2020 09/04/2019 09/29/2018  Total Protein 6.0 - 8.3 g/dL 8.0 8.0 7.4  Albumin 3.5 - 5.2 g/dL 4.1 4.1 3.9  AST 0 - 37 U/L '21 19 19  ' ALT 0 - 35 U/L '18 16 17  ' Alk Phosphatase 39 - 117 U/L 45 54 47  Total Bilirubin 0.2 - 1.2 mg/dL 1.0 1.1 0.7  Bilirubin, Direct 0.0 - 0.3 mg/dL - - -     The ASCVD Risk score ([place]., et al., 2013) failed to calculate for the following reasons:   The 2013 ASCVD risk score is only valid for ages 10 to 25   Patient has failed these meds in past: n/a Patient is currently controlled on the following medications:  . Simvastatin 20 mg HS . Aspirin 81 mg daily  We discussed:  diet and exercise extensively; Cholesterol goals; benefits of statin for ASCVD risk reduction  Plan  Continue current medications  Diabetes   A1c goal <8%  Recent  Relevant Labs: Lab Results  Component Value Date/Time   HGBA1C 7.9 (H) 03/17/2020 09:50 AM   HGBA1C 7.5 (H) 09/04/2019 02:40 PM   GFR 67.28 03/17/2020 09:50 AM   GFR 86.72 09/04/2019 02:40 PM   MICROALBUR 2.0 (H) 05/19/2015 09:21 AM   MICROALBUR 2.5 (H) 05/14/2014 10:01 AM    Last diabetic Eye exam:  Lab Results  Component Value Date/Time   HMDIABEYEEXA Retinopathy (A) 10/18/2018 12:00 AM    Last diabetic Foot exam: No results found for: HMDIABFOOTEX   Checking BG: Daily Recent FBG Readings: 170-180  Patient has failed these meds in past: pioglitazone  Patient is currently controlled on the following medications: Marland Kitchen Metformin 1000 mg BID . Glimepiride 4 mg daily AM (started ~2012) . Jardiance 25 mg daily AM . Testing supplies  We discussed: diet and exercise extensively; A1c goal; pt is worried about higher BG but given age A1c < 8% is reasonable; discussed to avoid starches/sugary foods and increase protein/vegetables in diet   Plan  Continue current medications    Medication Management   Pt uses [facility] mail order pharmacy for all medications Uses pill box? Yes Pt endorses 100% compliance  We discussed: Current pharmacy is preferred with insurance plan and patient is satisfied with pharmacy services  Plan  Continue current medication management strategy    Follow up: 6 month phone visit  Charlene Brooke, PharmD, BCACP Clinical Pharmacist [facility] [phone]

## 2020-05-06 ENCOUNTER — Ambulatory Visit (INDEPENDENT_AMBULATORY_CARE_PROVIDER_SITE_OTHER): Payer: HMO | Admitting: Ophthalmology

## 2020-05-06 ENCOUNTER — Encounter (INDEPENDENT_AMBULATORY_CARE_PROVIDER_SITE_OTHER): Payer: Self-pay | Admitting: Ophthalmology

## 2020-05-06 ENCOUNTER — Ambulatory Visit
Admission: RE | Admit: 2020-05-06 | Discharge: 2020-05-06 | Disposition: A | Discharge status: Home / Self Care | Payer: HMO | Source: Ambulatory Visit | Attending: Internal Medicine | Admitting: Internal Medicine

## 2020-05-06 ENCOUNTER — Other Ambulatory Visit: Payer: Self-pay

## 2020-05-06 DIAGNOSIS — G319 Degenerative disease of nervous system, unspecified: Secondary | ICD-10-CM | POA: Diagnosis not present

## 2020-05-06 DIAGNOSIS — E113311 Type 2 diabetes mellitus with moderate nonproliferative diabetic retinopathy with macular edema, right eye: Secondary | ICD-10-CM

## 2020-05-06 DIAGNOSIS — E113312 Type 2 diabetes mellitus with moderate nonproliferative diabetic retinopathy with macular edema, left eye: Secondary | ICD-10-CM | POA: Diagnosis not present

## 2020-05-06 DIAGNOSIS — H35072 Retinal telangiectasis, left eye: Secondary | ICD-10-CM

## 2020-05-06 DIAGNOSIS — R413 Other amnesia: Secondary | ICD-10-CM

## 2020-05-06 DIAGNOSIS — I6782 Cerebral ischemia: Secondary | ICD-10-CM | POA: Diagnosis not present

## 2020-05-06 DIAGNOSIS — I618 Other nontraumatic intracerebral hemorrhage: Secondary | ICD-10-CM | POA: Diagnosis not present

## 2020-05-06 MED ORDER — AFLIBERCEPT 2MG/0.05ML IZ SOLN FOR KALEIDOSCOPE
2.0000 mg | INTRAVITREAL | Status: AC | PRN
  Administered 2020-05-06: 2 mg via INTRAVITREAL

## 2020-05-06 NOTE — Progress Notes (Signed)
05/06/2020     CHIEF COMPLAINT Patient presents for Retina Follow Up (10 Week F/U OS, poss Eylea OS//Pt denies noticeable changes to [place] OU since last visit. Pt denies ocular pain, flashes of light, or floaters OU. //LBS: 161 last night)   HISTORY OF PRESENT ILLNESS: Whitney [name] is a 86 y.o. female who presents to the clinic today for:   HPI    Retina Follow Up    Patient presents with  Diabetic Retinopathy.  In left eye.  This started 10 weeks ago.  Severity is mild.  Duration of 10 weeks.  Since onset it is stable. Additional comments: 10 Week F/U OS, poss Eylea OS  Pt denies noticeable changes to [place] OU since last visit. Pt denies ocular pain, flashes of light, or floaters OU.   LBS: 161 last night       Last edited by Rockie Neighbours, [place] on 05/06/2020  9:53 AM. (History)      Referring physician: [name], MD [place],  [place] [address]  HISTORICAL INFORMATION:   Selected notes from the MEDICAL RECORD NUMBER    Lab Results  Component Value Date   HGBA1C 7.9 (H) 03/17/2020     CURRENT MEDICATIONS: Current Outpatient Medications (Ophthalmic Drugs)  Medication Sig  . dorzolamide-timolol (COSOPT) 22.3-6.8 MG/ML ophthalmic solution Instill 1 drop in each eye twice a day  . DUREZOL 0.05 % EMUL  (Patient not taking: Reported on 05/06/2020)   No current facility-administered medications for this visit. (Ophthalmic Drugs)   Current Outpatient Medications (Other)  Medication Sig  . aspirin 81 MG tablet Take 1 tablet (81 mg total) by mouth daily.  . blood glucose meter kit and supplies KIT Use to test blood sugar up to 3 times daily. DX E11.9  . Calcium Carb-Cholecalciferol (CALCIUM-VITAMIN D) 500-200 MG-UNIT tablet Take 1 tablet by mouth 2 (two) times daily with a meal.  . diclofenac Sodium (VOLTAREN) 1 % GEL Apply 4 g topically 4 (four) times daily.  . empagliflozin (JARDIANCE) 25 MG TABS tablet Take 1 tablet (25 mg total) by mouth daily  before breakfast.  . glimepiride (AMARYL) 4 MG tablet TAKE 1 TABLET EVERY DAY BEFORE BREAKFAST  . glucose blood (ONETOUCH VERIO) test strip Use to test blood sugar 3 times a day  . Lancet Devices (ACCU-CHEK SOFTCLIX) lancets Use to test blood sugar up to 3 times a day. DX E11.9  . lisinopril-hydrochlorothiazide (ZESTORETIC) 20-12.5 MG tablet Take 1 tablet by mouth daily.  . metFORMIN (GLUCOPHAGE) 1000 MG tablet Take 1 tablet (1,000 mg total) by mouth 2 (two) times daily with a meal.  . Multiple Vitamin (MULTI VITAMIN DAILY PO) Take one by mouth daily  . simvastatin (ZOCOR) 20 MG tablet Take 1 tablet (20 mg total) by mouth at bedtime.   No current facility-administered medications for this visit. (Other)      REVIEW OF SYSTEMS:    ALLERGIES No Known Allergies  PAST MEDICAL HISTORY Past Medical History:  Diagnosis Date  . Anxiety   . Baker's cyst   . Chest pain   . Diabetes mellitus type 2, uncontrolled ([place])   . Diabetes mellitus without complication ([place])   . Disc disease, degenerative, cervical   . Diverticulosis of colon   . DJD (degenerative joint disease) of knee   . Facial pain, atypical   . GERD (gastroesophageal reflux disease)   . Glaucoma   . Hypercholesteremia   . Hypertension   . Lumbar spondylosis   .  Obesity   . Onychomycosis   . Osteopenia    Past Surgical History:  Procedure Laterality Date  . right eye surgery  09/2010   retina in right eye  . VAGINAL HYSTERECTOMY  2002   Dr. Kem Kays repair    FAMILY HISTORY Family History  Problem Relation Age of Onset  . Diabetes Mother   . Colon cancer Neg Hx   . Stomach cancer Neg Hx     SOCIAL HISTORY Social History   Tobacco Use  . Smoking status: Never Smoker  . Smokeless tobacco: Never Used  Vaping Use  . Vaping Use: Never used  Substance Use Topics  . Alcohol use: No  . Drug use: No         OPHTHALMIC EXAM: Base Eye Exam    Visual Acuity (ETDRS)      Right Left   Dist cc 20/25 -2  20/30 +1   Dist ph cc  20/30 +2   Correction: Glasses       Tonometry (Tonopen, 9:53 AM)      Right Left   Pressure 12 11       Pupils      Pupils Dark Light Shape React APD   Right PERRL 3 2 Irregular Brisk None   Left PERRL 3 2 Irregular Brisk None       Visual Fields (Counting fingers)      Left Right    Full Full       Extraocular Movement      Right Left    Full Full       Neuro/Psych    Oriented x3: Yes   Mood/Affect: Normal       Dilation    Left eye: 1.0% Mydriacyl, 2.5% Phenylephrine @ 9:57 AM        Slit Lamp and Fundus Exam    External Exam      Right Left   External Normal Normal       Slit Lamp Exam      Right Left   Lids/Lashes Normal Normal   Conjunctiva/Sclera White and quiet White and quiet   Cornea Clear Clear   Anterior Chamber Deep and quiet Deep and quiet   Iris Round and reactive Round and reactive   Lens Posterior chamber intraocular lens Posterior chamber intraocular lens   Anterior Vitreous Normal Normal       Fundus Exam      Right Left   Posterior Vitreous  Normal   Disc  Normal   C/D Ratio  0.7   Macula  Microaneurysms, Macular thickening, Cystoid macular edema, Mild clinically significant macular edema   Vessels  NPDR, moderate   Periphery  Normal          IMAGING AND PROCEDURES  Imaging and Procedures for 05/06/20  OCT, Retina - OU - Both Eyes       Right Eye Quality was good. Scan locations included subfoveal. Central Foveal Thickness: 256. Findings include cystoid macular edema.   Left Eye Quality was good. Scan locations included subfoveal. Central Foveal Thickness: 278. Findings include cystoid macular edema.   Notes Minor CSME OD at current interval, 7 weeks post last injection antivegF  OS, significant recurrent focal CSME on temporal aspect of the fovea in the region of previous active MAC-TEL, at current 10-week follow-up interval.  We will repeat Eylea today OS and examination again in 8  weeks         Intravitreal Injection, Pharmacologic Agent - OS -  Left Eye       Time Out 05/06/2020. 11:05 AM. Confirmed correct patient, procedure, site, and patient consented.   Anesthesia Topical anesthesia was used. Anesthetic medications included [place] 3.5%.   Procedure Preparation included Tobramycin 0.3%, 10% betadine to eyelids, 5% betadine to ocular surface, Ofloxacin . A 30 gauge needle was used.   Injection:  2 mg aflibercept Alfonse Flavors) SOLN   NDC: [id number], Lot: [id number]   Route: Intravitreal, Site: Left Eye, Waste: 0 mg  Post-op Post injection exam found visual acuity of at least counting fingers. The patient tolerated the procedure well. There were no complications. The patient received written and verbal post procedure care education. Post injection medications were not given.                 ASSESSMENT/PLAN:  Moderate nonproliferative diabetic retinopathy of right eye with macular edema (HCC) Overall OD improved at 7-week follow-up by OCT findings today  Moderate nonproliferative diabetic retinopathy of left eye with macular edema associated with type 2 diabetes mellitus (HCC) OS with recurrence and increase in temporal CSME in region of previous MAC-TEL activity, at 10-week interval post Eylea injection.  We will repeat injection today to control the CSME component of this disease as she continues with excellent CPAP compliance      ICD-10-CM   1. Moderate nonproliferative diabetic retinopathy of left eye with macular edema associated with type 2 diabetes mellitus (HCC)  C37.6283 OCT, Retina - OU - Both Eyes    Intravitreal Injection, Pharmacologic Agent - OS - Left Eye    aflibercept (EYLEA) SOLN 2 mg  2. Moderate nonproliferative diabetic retinopathy of right eye with macular edema associated with type 2 diabetes mellitus ([place])  E11.3311     1.  Repeat injection intravitreal Eylea today to resolve CSME left eye.  Active again at 10-week  follow-up.  Follow-up left eye examination in 8 weeks  2.  OD today at 7-week follow-up, improved overall, improved follow-up as scheduled next  3.  Patient continues with excellent compliance on CPAP to assist in nightly macular oxygenation  Ophthalmic Meds Ordered this visit:  Meds ordered this encounter  Medications  . aflibercept (EYLEA) SOLN 2 mg       Return in about 8 weeks (around 07/01/2020) for dilate, EYLEA OCT, OS,, and follow-up OD as scheduled.  There are no Patient Instructions on file for this visit.   Explained the diagnoses, plan, and follow up with the patient and they expressed understanding.  Patient expressed understanding of the importance of proper follow up care.   Clent Demark Kayleen Alig M.D. Diseases & Surgery of the Retina and Vitreous Retina & Diabetic [place] 05/06/20     Abbreviations: M myopia (nearsighted); A astigmatism; H hyperopia (farsighted); P presbyopia; Mrx spectacle prescription;  CTL contact lenses; OD right eye; OS left eye; OU both eyes  XT exotropia; ET esotropia; PEK punctate epithelial keratitis; PEE punctate epithelial erosions; DES dry eye syndrome; MGD meibomian gland dysfunction; ATs artificial tears; PFAT's preservative free artificial tears; [place] nuclear sclerotic cataract; PSC posterior subcapsular cataract; ERM epi-retinal membrane; PVD posterior vitreous detachment; RD retinal detachment; DM diabetes mellitus; DR diabetic retinopathy; NPDR non-proliferative diabetic retinopathy; PDR proliferative diabetic retinopathy; CSME clinically significant macular edema; DME diabetic macular edema; dbh dot blot hemorrhages; CWS cotton wool spot; POAG primary open angle glaucoma; C/D cup-to-disc ratio; HVF humphrey visual field; GVF goldmann visual field; OCT optical coherence tomography; IOP intraocular pressure; BRVO Branch retinal vein occlusion; CRVO central retinal vein  occlusion; CRAO central retinal artery occlusion; BRAO branch retinal artery  occlusion; RT retinal tear; SB scleral buckle; PPV pars plana vitrectomy; VH Vitreous hemorrhage; PRP panretinal laser photocoagulation; IVK intravitreal kenalog; VMT vitreomacular traction; MH Macular hole;  NVD neovascularization of the disc; NVE neovascularization elsewhere; AREDS age related eye disease study; ARMD age related macular degeneration; POAG primary open angle glaucoma; EBMD epithelial/anterior basement membrane dystrophy; ACIOL anterior chamber intraocular lens; IOL intraocular lens; PCIOL posterior chamber intraocular lens; Phaco/IOL phacoemulsification with intraocular lens placement; [place] photorefractive keratectomy; LASIK laser assisted in situ keratomileusis; HTN hypertension; DM diabetes mellitus; COPD chronic obstructive pulmonary disease

## 2020-05-06 NOTE — Assessment & Plan Note (Signed)
Continued excellent compliance with CPAP use, now CSME is responsive to therapy.

## 2020-05-06 NOTE — Assessment & Plan Note (Signed)
Overall OD improved at 7-week follow-up by OCT findings today

## 2020-05-06 NOTE — Patient Instructions (Signed)
Patient instructed to contact the office promptly for new onset visual acuity declines or distortions 

## 2020-05-06 NOTE — Assessment & Plan Note (Signed)
OS with recurrence and increase in temporal CSME in region of previous MAC-TEL activity, at 10-week interval post Eylea injection.  We will repeat injection today to control the CSME component of this disease as she continues with excellent CPAP compliance

## 2020-05-07 ENCOUNTER — Telehealth: Payer: Self-pay | Admitting: Internal Medicine

## 2020-05-07 MED ORDER — DORZOLAMIDE HCL-TIMOLOL MAL 2-0.5 % OP SOLN: OPHTHALMIC | 1 refills | Status: DC

## 2020-05-07 NOTE — Telephone Encounter (Signed)
Eye drops have been sent to the patient's pharmacy.

## 2020-05-07 NOTE — Telephone Encounter (Signed)
dorzolamide-timolol (COSOPT) 22.3-6.8 MG/ML ophthalmic solution Herbalist ([place], [place] Phone:  [phone]  Fax:  [phone]     Requesting a refill  Last seen- 12.06.22 Next apt- n/a

## 2020-05-15 ENCOUNTER — Ambulatory Visit: Payer: HMO | Admitting: Adult Health

## 2020-05-15 VITALS — BP 125/71 | HR 83 | Ht 61.0 in | Wt 154.0 lb

## 2020-05-15 DIAGNOSIS — G4733 Obstructive sleep apnea (adult) (pediatric): Secondary | ICD-10-CM

## 2020-05-15 DIAGNOSIS — Z9989 Dependence on other enabling machines and devices: Secondary | ICD-10-CM

## 2020-05-15 NOTE — Progress Notes (Signed)
PATIENT: Whitney [name] DOB: 03-16-1935  REASON FOR VISIT: follow up HISTORY FROM: patient  HISTORY OF PRESENT ILLNESS: Today 05/15/20:  Whitney [name] is a 86 year old female with a history of OSA on CPAP. She returns today for follow-up. Reports that CPAP is working well. Denies any new issues. Report is below:  Compliance Report: Compliance Usage 04/15/2020 - 05/14/2020 Usage days 23/30 days (77%) >= 4 hours 22 days (73%) Average usage (days used) 7 hours 4 minutes  AirSense 10 AutoSet For Her Serial number [id number] Mode AutoSet for Her Min Pressure 7 cmH2O Max Pressure 16 cmH2O EPR Fulltime EPR level 3  Therapy Pressure - cmH2O Median: 9.4 95th percentile: 11.8 Maximum: 12.7 Leaks - L/min Median: 6.8 95th percentile: 28.5 Maximum: 40.6 Events per hour AI: 3.9 HI: 0.5 AHI: 4.4 Apnea Index Central: 1.8 Obstructive: 1.9 Unknown: 0.2   05/15/19:Whitney [name] is an 86 year old female with a history of obstructive sleep apnea on CPAP.  She returns today for follow-up.  Her CPAP download indicates that she use her machine 28 out of 30 days for compliance of 93%.  She used her machine greater than 4 hours 27 days for compliance of 90%.  On average she uses her machine 8 hours and 43 minutes.  Her residual AHI is 5.4 on 7 to 16 cm of water with a EPR of 3.  Her leak in the 95th percentile is 32.4 L/min.  She states that she does not change out her supplies as she should because she does not know how.  She returns today for an evaluation.   HISTORY 10/05/18:  Whitney [name] is an 86 year old female with a history of obstructive sleep apnea on CPAP.  Her download indicates that she used her machine 23 out of 30 days for compliance of 77%.  She use her machine greater than 4 hours 22 out of 30 days for compliance of 73%.  On average she uses her machine 7 hours and 54 minutes.  Her residual AHI is 7.1 on 7 to 14 cm of water with EPR of 3.  She states that she has never changed out her  supplies.  She does not feel the mask leaking at night.  Although she does report that sometimes she gets a red face on her CPAP machine.  She returns today for follow-up.  REVIEW OF SYSTEMS: Out of a complete 14 system review of symptoms, the patient complains only of the following symptoms, and all other reviewed systems are negative.  Epworth sleepiness score 5  FSS 17  ALLERGIES: No Known Allergies  HOME MEDICATIONS: Outpatient Medications Prior to Visit  Medication Sig Dispense Refill  . aspirin 81 MG tablet Take 1 tablet (81 mg total) by mouth daily. 90 tablet 1  . blood glucose meter kit and supplies KIT Use to test blood sugar up to 3 times daily. DX E11.9 1 each 0  . Calcium Carb-Cholecalciferol (CALCIUM-VITAMIN D) 500-200 MG-UNIT tablet Take 1 tablet by mouth 2 (two) times daily with a meal. 180 tablet 1  . diclofenac Sodium (VOLTAREN) 1 % GEL Apply 4 g topically 4 (four) times daily. 100 g 6  . dorzolamide-timolol (COSOPT) 22.3-6.8 MG/ML ophthalmic solution Instill 1 drop in each eye twice a day 10 mL 1  . DUREZOL 0.05 % EMUL     . empagliflozin (JARDIANCE) 25 MG TABS tablet Take 1 tablet (25 mg total) by mouth daily before breakfast. 90 tablet 3  . glimepiride (AMARYL) 4 MG tablet  TAKE 1 TABLET EVERY DAY BEFORE BREAKFAST 90 tablet 1  . glucose blood (ONETOUCH VERIO) test strip Use to test blood sugar 3 times a day 300 each 1  . Lancet Devices (ACCU-CHEK SOFTCLIX) lancets Use to test blood sugar up to 3 times a day. DX E11.9 1 each 0  . lisinopril-hydrochlorothiazide (ZESTORETIC) 20-12.5 MG tablet Take 1 tablet by mouth daily. 90 tablet 1  . metFORMIN (GLUCOPHAGE) 1000 MG tablet Take 1 tablet (1,000 mg total) by mouth 2 (two) times daily with a meal. 180 tablet 1  . Multiple Vitamin (MULTI VITAMIN DAILY PO) Take one by mouth daily    . simvastatin (ZOCOR) 20 MG tablet Take 1 tablet (20 mg total) by mouth at bedtime. 90 tablet 1   No facility-administered medications prior to  visit.    PAST MEDICAL HISTORY: Past Medical History:  Diagnosis Date  . Anxiety   . Baker's cyst   . Chest pain   . Diabetes mellitus type 2, uncontrolled ([place])   . Diabetes mellitus without complication ([place])   . Disc disease, degenerative, cervical   . Diverticulosis of colon   . DJD (degenerative joint disease) of knee   . Facial pain, atypical   . GERD (gastroesophageal reflux disease)   . Glaucoma   . Hypercholesteremia   . Hypertension   . Lumbar spondylosis   . Obesity   . Onychomycosis   . Osteopenia     PAST SURGICAL HISTORY: Past Surgical History:  Procedure Laterality Date  . right eye surgery  October 11, 2010   retina in right eye  . VAGINAL HYSTERECTOMY  07/10/00   Dr. Kem Kays repair    FAMILY HISTORY: Family History  Problem Relation Age of Onset  . Diabetes Mother   . Colon cancer Neg Hx   . Stomach cancer Neg Hx     SOCIAL HISTORY: Social History   Socioeconomic History  . Marital status: Widowed    Spouse name: deceased 10-Oct-2008  . Number of children: Not on file  . Years of education: Not on file  . Highest education level: Not on file  Occupational History  . Not on file  Tobacco Use  . Smoking status: Never Smoker  . Smokeless tobacco: Never Used  Vaping Use  . Vaping Use: Never used  Substance and Sexual Activity  . Alcohol use: No  . Drug use: No  . Sexual activity: Never  Other Topics Concern  . Not on file  Social History Narrative   Lives alone   Spouse passed away 2008/07/10   Son;    3 girls   Clinical cytogeneticist;    Social Determinants of Health   Financial Resource Strain: Low Risk   . Difficulty of Paying Living Expenses: Not hard at all  Food Insecurity: No Food Insecurity  . Worried About Charity fundraiser in the Last Year: Never true  . Ran Out of Food in the Last Year: Never true  Transportation Needs: No Transportation Needs  . Lack of Transportation (Medical): No  . Lack of Transportation (Non-Medical): No  Physical Activity: Not  on file  Stress: Not on file  Social Connections: Not on file  Intimate Partner Violence: Not on file      PHYSICAL EXAM  Vitals:   05/15/20 1254  BP: 125/71  Pulse: 83  Weight: 154 lb (69.9 kg)  Height: '5\' 1"'  (1.549 m)   Body mass index is 29.1 kg/m.  Generalized: Well developed, in no acute distress  Chest: Lungs  clear to auscultation bilaterally  Neurological examination  Mentation: Alert oriented to time, place, history taking. Follows all commands speech and language fluent Cranial nerve II-XII: Extraocular movements were full, visual field were full on confrontational test Head turning and shoulder shrug  were normal and symmetric. Motor: The motor testing reveals 5 over 5 strength of all 4 extremities. Good symmetric motor tone is noted throughout.  Sensory: Sensory testing is intact to soft touch on all 4 extremities. No evidence of extinction is noted.  Gait and station: Gait is normal.   DIAGNOSTIC DATA (LABS, IMAGING, TESTING) - I reviewed patient records, labs, notes, testing and imaging myself where available.  Lab Results  Component Value Date   WBC 5.4 03/17/2020   HGB 12.9 03/17/2020   HCT 39.7 03/17/2020   MCV 79.1 03/17/2020   PLT 222.0 03/17/2020      Component Value Date/Time   NA 138 03/17/2020 0950   K 3.8 03/17/2020 0950   CL 100 03/17/2020 0950   CO2 29 03/17/2020 0950   GLUCOSE 272 (H) 03/17/2020 0950   BUN 11 03/17/2020 0950   CREATININE 0.81 03/17/2020 0950   CALCIUM 10.6 (H) 03/17/2020 0950   PROT 8.0 03/17/2020 0950   ALBUMIN 4.1 03/17/2020 0950   AST 21 03/17/2020 0950   ALT 18 03/17/2020 0950   ALKPHOS 45 03/17/2020 0950   BILITOT 1.0 03/17/2020 0950   GFRNONAA 87.17 02/16/2010 1003   GFRAA 156 02/19/2008 1103   Lab Results  Component Value Date   CHOL 145 03/17/2020   HDL 40.50 03/17/2020   LDLCALC 80 03/17/2020   TRIG 124.0 03/17/2020   CHOLHDL 4 03/17/2020   Lab Results  Component Value Date   HGBA1C 7.9 (H)  03/17/2020   No results found for: VITAMINB12 Lab Results  Component Value Date   TSH 1.13 09/14/2013      ASSESSMENT AND PLAN 86 y.o. year old female  has a past medical history of Anxiety, Baker's cyst, Chest pain, Diabetes mellitus type 2, uncontrolled ([place]), Diabetes mellitus without complication ([place]), Disc disease, degenerative, cervical, Diverticulosis of colon, DJD (degenerative joint disease) of knee, Facial pain, atypical, GERD (gastroesophageal reflux disease), Glaucoma, Hypercholesteremia, Hypertension, Lumbar spondylosis, Obesity, Onychomycosis, and Osteopenia. here with:  1. Obstructive sleep apnea on CPAP  Patient CPAP download shows excellent compliance and good treatment of her apnea.  She is encouraged to continue using CPAP nightly and greater than 4 hours each night.   She is advised that if her symptoms worsen or she develops new symptoms she should let us know.  She will follow-up in 1 year or sooner if needed   I spent 20 minutes of face-to-face and non-face-to-face time with patient.  This included previsit chart review, lab review, study review, order entry, electronic health record documentation, patient education.   Ward Givens, MSN, NP-C 05/15/2020, 1:03 PM [facility] [address] [place], [place] [address] [phone]

## 2020-05-15 NOTE — Patient Instructions (Signed)
Continue using CPAP nightly and greater than 4 hours each night °If your symptoms worsen or you develop new symptoms please let us know.  ° °

## 2020-05-16 DIAGNOSIS — G4733 Obstructive sleep apnea (adult) (pediatric): Secondary | ICD-10-CM | POA: Diagnosis not present

## 2020-06-17 ENCOUNTER — Other Ambulatory Visit: Payer: Self-pay

## 2020-06-17 ENCOUNTER — Encounter (INDEPENDENT_AMBULATORY_CARE_PROVIDER_SITE_OTHER): Payer: Self-pay | Admitting: Ophthalmology

## 2020-06-17 ENCOUNTER — Ambulatory Visit (INDEPENDENT_AMBULATORY_CARE_PROVIDER_SITE_OTHER): Payer: HMO | Admitting: Ophthalmology

## 2020-06-17 DIAGNOSIS — E113312 Type 2 diabetes mellitus with moderate nonproliferative diabetic retinopathy with macular edema, left eye: Secondary | ICD-10-CM

## 2020-06-17 DIAGNOSIS — E113311 Type 2 diabetes mellitus with moderate nonproliferative diabetic retinopathy with macular edema, right eye: Secondary | ICD-10-CM | POA: Diagnosis not present

## 2020-06-17 DIAGNOSIS — H35071 Retinal telangiectasis, right eye: Secondary | ICD-10-CM

## 2020-06-17 MED ORDER — BEVACIZUMAB 2.5 MG/0.1ML IZ SOSY
2.5000 mg | PREFILLED_SYRINGE | INTRAVITREAL | Status: AC | PRN
  Administered 2020-06-17: 2.5 mg via INTRAVITREAL

## 2020-06-17 NOTE — Assessment & Plan Note (Addendum)
Today at 6-week follow-up post Avastin OS improved stable macula, follow-up in 2 weeks as scheduled

## 2020-06-17 NOTE — Assessment & Plan Note (Signed)
Today follow-up at 3 months.  At 6-week follow-up, January 25 OCT, much less CSME and perifoveal CME post Avastin 03-18-20.  Thus extended interval 3 months may be too long since recurrence of CME and CSME has occurred.  Repeat injection Avastin OD today and evaluate again in 8 weeks

## 2020-06-17 NOTE — Progress Notes (Signed)
06/17/2020     CHIEF COMPLAINT Patient presents for Retina Follow Up (3 Month NPDR f\u OD. Possible Avastin OD. OCT/Pt states vision is stable. Pt c/o OU burning and running water./BGL: 154)   HISTORY OF PRESENT ILLNESS: Whitney [name] is a 86 y.o. female who presents to the clinic today for:   HPI    Retina Follow Up    Patient presents with  Diabetic Retinopathy.  In right eye.  Severity is moderate.  Duration of 3 months.  Since onset it is stable.  I, the attending physician,  performed the HPI with the patient and updated documentation appropriately. Additional comments: 3 Month NPDR f\u OD. Possible Avastin OD. OCT Pt states vision is stable. Pt c/o OU burning and running water. BGL: 154       Last edited by Tilda Franco on 06/17/2020  9:45 AM. (History)      Referring physician: [name], MD [place],  [place] [id number]  HISTORICAL INFORMATION:   Selected notes from the MEDICAL RECORD NUMBER    Lab Results  Component Value Date   HGBA1C 7.9 (H) 03/17/2020     CURRENT MEDICATIONS: Current Outpatient Medications (Ophthalmic Drugs)  Medication Sig  . dorzolamide-timolol (COSOPT) 22.3-6.8 MG/ML ophthalmic solution Instill 1 drop in each eye twice a day  . DUREZOL 0.05 % EMUL    No current facility-administered medications for this visit. (Ophthalmic Drugs)   Current Outpatient Medications (Other)  Medication Sig  . aspirin 81 MG tablet Take 1 tablet (81 mg total) by mouth daily.  . blood glucose meter kit and supplies KIT Use to test blood sugar up to 3 times daily. DX E11.9  . Calcium Carb-Cholecalciferol (CALCIUM-VITAMIN D) 500-200 MG-UNIT tablet Take 1 tablet by mouth 2 (two) times daily with a meal.  . diclofenac Sodium (VOLTAREN) 1 % GEL Apply 4 g topically 4 (four) times daily.  . empagliflozin (JARDIANCE) 25 MG TABS tablet Take 1 tablet (25 mg total) by mouth daily before breakfast.  . glimepiride (AMARYL) 4 MG tablet TAKE 1  TABLET EVERY DAY BEFORE BREAKFAST  . glucose blood (ONETOUCH VERIO) test strip Use to test blood sugar 3 times a day  . Lancet Devices (ACCU-CHEK SOFTCLIX) lancets Use to test blood sugar up to 3 times a day. DX E11.9  . lisinopril-hydrochlorothiazide (ZESTORETIC) 20-12.5 MG tablet Take 1 tablet by mouth daily.  . metFORMIN (GLUCOPHAGE) 1000 MG tablet Take 1 tablet (1,000 mg total) by mouth 2 (two) times daily with a meal.  . Multiple Vitamin (MULTI VITAMIN DAILY PO) Take one by mouth daily  . simvastatin (ZOCOR) 20 MG tablet Take 1 tablet (20 mg total) by mouth at bedtime.   No current facility-administered medications for this visit. (Other)      REVIEW OF SYSTEMS: ROS    Positive for: Endocrine   Last edited by Tilda Franco on 06/17/2020  9:45 AM. (History)       ALLERGIES No Known Allergies  PAST MEDICAL HISTORY Past Medical History:  Diagnosis Date  . Anxiety   . Baker's cyst   . Chest pain   . Diabetes mellitus type 2, uncontrolled ([place])   . Diabetes mellitus without complication ([place])   . Disc disease, degenerative, cervical   . Diverticulosis of colon   . DJD (degenerative joint disease) of knee   . Facial pain, atypical   . GERD (gastroesophageal reflux disease)   . Glaucoma   . Hypercholesteremia   .  Hypertension   . Lumbar spondylosis   . Obesity   . Onychomycosis   . Osteopenia    Past Surgical History:  Procedure Laterality Date  . right eye surgery  09/2010   retina in right eye  . VAGINAL HYSTERECTOMY  2002   Dr. Kem Kays repair    FAMILY HISTORY Family History  Problem Relation Age of Onset  . Diabetes Mother   . Colon cancer Neg Hx   . Stomach cancer Neg Hx     SOCIAL HISTORY Social History   Tobacco Use  . Smoking status: Never Smoker  . Smokeless tobacco: Never Used  Vaping Use  . Vaping Use: Never used  Substance Use Topics  . Alcohol use: No  . Drug use: No         OPHTHALMIC EXAM:  Base Eye Exam    Visual  Acuity (Snellen - Linear)      Right Left   Dist cc 20/25 -2 20/30 -2   Correction: Glasses       Tonometry (Tonopen, 9:49 AM)      Right Left   Pressure 15 12       Pupils      Pupils Dark Light Shape React APD   Right PERRL 3 2 Irregular Slow None   Left PERRL 3 2 Irregular Slow None       Visual Fields (Counting fingers)      Left Right    Full Full       Neuro/Psych    Oriented x3: Yes   Mood/Affect: Normal       Dilation    Right eye: 1.0% Mydriacyl, 2.5% Phenylephrine @ 9:49 AM        Slit Lamp and Fundus Exam    External Exam      Right Left   External Normal Normal       Slit Lamp Exam      Right Left   Lids/Lashes Normal Normal   Conjunctiva/Sclera White and quiet White and quiet   Cornea Clear Clear   Anterior Chamber Deep and quiet Deep and quiet   Iris Round and reactive Round and reactive   Lens Posterior chamber intraocular lens Posterior chamber intraocular lens   Anterior Vitreous Normal Normal       Fundus Exam      Right Left   Posterior Vitreous Normal    Disc Normal    C/D Ratio 0.55    Macula Microaneurysms,, trace clinical CSME    Vessels NPDR, moderate    Periphery Normal           IMAGING AND PROCEDURES  Imaging and Procedures for 06/17/20  OCT, Retina - OU - Both Eyes       Right Eye Quality was good. Scan locations included subfoveal. Central Foveal Thickness: 322. Progression has worsened. Findings include cystoid macular edema.   Left Eye Quality was good. Scan locations included subfoveal. Central Foveal Thickness: 230. Progression has improved. Findings include cystoid macular edema.   Notes Worsening of CSME OD at current interval, 12 weeks post last injection antivegF, repeat injection today and shorter interval follow-up at 8 weeks next  OS, much improved CME, at current 6-week follow-up interval.  We will repeat examination as scheduled OS       Intravitreal Injection, Pharmacologic Agent - OD - Right  Eye       Time Out 06/17/2020. 10:19 AM. Confirmed correct patient, procedure, site, and patient consented.   Anesthesia Topical anesthesia  was used. Anesthetic medications included Akten 3.5%.   Procedure Preparation included 10% betadine to eyelids, 5% betadine to ocular surface, Ofloxacin . A 30 gauge needle was used.   Injection:  2.5 mg Bevacizumab (AVASTIN) 2.3m/0.1mL SOSY   NDC:: 31438-887-57 Lot:: [id number]  Route: Intravitreal, Site: Right Eye  Post-op Post injection exam found visual acuity of at least counting fingers. The patient tolerated the procedure well. There were no complications. The patient received written and verbal post procedure care education. Post injection medications were not given.                 ASSESSMENT/PLAN:  Moderate nonproliferative diabetic retinopathy of right eye with macular edema (HCC) Today follow-up at 3 months.  At 6-week follow-up, January 25 OCT, much less CSME and perifoveal CME post Avastin 03-18-20.  Thus extended interval 3 months may be too long since recurrence of CME and CSME has occurred.  Repeat injection Avastin OD today and evaluate again in 8 weeks  Retinal telangiectasia of right eye Patient continues to be compliant with excellent use of CPAP nightly  This is coincident with improved sensitization and responsiveness to therapy using intravitreal antivegF for the diabetic eye disease, CSME component of maculopathy  Moderate nonproliferative diabetic retinopathy of left eye with macular edema associated with type 2 diabetes mellitus ([place] Today at 6-week follow-up post Avastin OS improved stable macula, follow-up in 2 weeks as scheduled      ICD-10-CM   1. Moderate nonproliferative diabetic retinopathy of right eye with macular edema associated with type 2 diabetes mellitus (HCC)  E11.3311 OCT, Retina - OU - Both Eyes    Intravitreal Injection, Pharmacologic Agent - OD - Right Eye    bevacizumab (AVASTIN) SOSY 2.5  mg  2. Retinal telangiectasia of right eye  H35.071   3. Moderate nonproliferative diabetic retinopathy of left eye with macular edema associated with type 2 diabetes mellitus ([place]  EO15.6153    1.  OD continues to show excellent responsiveness to therapy with antivegF for diabetic CSME now that patient continues on CPAP for underlying nightly hypoxic events.  However at 374-monthnterval CME does recur.  Thus we will repeat injection Avastin today and shorten interval follow-up from 12 weeks to now 8 weeks    2.  OS, continues to improve and stabilize on Eylea, will repeat examination in 2 weeks as scheduled dilate OS then  3.  Ophthalmic Meds Ordered this visit:  Meds ordered this encounter  Medications  . bevacizumab (AVASTIN) SOSY 2.5 mg       Return in about 8 weeks (around 08/12/2020) for dilate, OD, AVASTIN OCT.  There are no Patient Instructions on file for this visit.   Explained the diagnoses, plan, and follow up with the patient and they expressed understanding.  Patient expressed understanding of the importance of proper follow up care.   GaClent Demarkankin M.D. Diseases & Surgery of the Retina and Vitreous Retina & Diabetic EyMcLemores[place]3/08/22     Abbreviations: M myopia (nearsighted); A astigmatism; H hyperopia (farsighted); P presbyopia; Mrx spectacle prescription;  CTL contact lenses; OD right eye; OS left eye; OU both eyes  XT exotropia; ET esotropia; PEK punctate epithelial keratitis; PEE punctate epithelial erosions; DES dry eye syndrome; MGD meibomian gland dysfunction; ATs artificial tears; PFAT's preservative free artificial tears; [place] sclerotic cataract; PSC posterior subcapsular cataract; ERM epi-retinal membrane; PVD posterior vitreous detachment; RD retinal detachment; DM diabetes mellitus; DR diabetic retinopathy; NPDR non-proliferative diabetic retinopathy;  PDR proliferative diabetic retinopathy; CSME clinically significant macular edema; DME  diabetic macular edema; dbh dot blot hemorrhages; CWS cotton wool spot; POAG primary open angle glaucoma; C/D cup-to-disc ratio; HVF humphrey visual field; GVF goldmann visual field; OCT optical coherence tomography; IOP intraocular pressure; BRVO Branch retinal vein occlusion; CRVO central retinal vein occlusion; CRAO central retinal artery occlusion; BRAO branch retinal artery occlusion; RT retinal tear; SB scleral buckle; PPV pars plana vitrectomy; VH Vitreous hemorrhage; PRP panretinal laser photocoagulation; IVK intravitreal kenalog; VMT vitreomacular traction; MH Macular hole;  NVD neovascularization of the disc; NVE neovascularization elsewhere; AREDS age related eye disease study; ARMD age related macular degeneration; POAG primary open angle glaucoma; EBMD epithelial/anterior basement membrane dystrophy; ACIOL anterior chamber intraocular lens; IOL intraocular lens; PCIOL posterior chamber intraocular lens; Phaco/IOL phacoemulsification with intraocular lens placement; [place] photorefractive keratectomy; LASIK laser assisted in situ keratomileusis; HTN hypertension; DM diabetes mellitus; COPD chronic obstructive pulmonary disease

## 2020-06-17 NOTE — Assessment & Plan Note (Signed)
Patient continues to be compliant with excellent use of CPAP nightly  This is coincident with improved sensitization and responsiveness to therapy using intravitreal antivegF for the diabetic eye disease, CSME component of maculopathy

## 2020-07-01 ENCOUNTER — Other Ambulatory Visit: Payer: Self-pay

## 2020-07-01 ENCOUNTER — Ambulatory Visit (INDEPENDENT_AMBULATORY_CARE_PROVIDER_SITE_OTHER): Payer: HMO | Admitting: Ophthalmology

## 2020-07-01 ENCOUNTER — Encounter (INDEPENDENT_AMBULATORY_CARE_PROVIDER_SITE_OTHER): Payer: Self-pay | Admitting: Ophthalmology

## 2020-07-01 DIAGNOSIS — E113312 Type 2 diabetes mellitus with moderate nonproliferative diabetic retinopathy with macular edema, left eye: Secondary | ICD-10-CM

## 2020-07-01 DIAGNOSIS — H35072 Retinal telangiectasis, left eye: Secondary | ICD-10-CM

## 2020-07-01 DIAGNOSIS — E113311 Type 2 diabetes mellitus with moderate nonproliferative diabetic retinopathy with macular edema, right eye: Secondary | ICD-10-CM

## 2020-07-01 MED ORDER — AFLIBERCEPT 2MG/0.05ML IZ SOLN FOR KALEIDOSCOPE
2.0000 mg | INTRAVITREAL | Status: AC | PRN
  Administered 2020-07-01: 2 mg via INTRAVITREAL

## 2020-07-01 NOTE — Progress Notes (Signed)
07/01/2020     CHIEF COMPLAINT Patient presents for Retina Follow Up (8 Wk F/U OS, poss Eylea OS//Pt denies noticeable changes to [place] OU since last visit. Pt denies ocular pain, flashes of light, or floaters OU. //LBS: 157 yesterday)   HISTORY OF PRESENT ILLNESS: Whitney [name] is a 86 y.o. female who presents to the clinic today for:   HPI    Retina Follow Up    Patient presents with  Diabetic Retinopathy.  In left eye.  This started 8 weeks ago.  Severity is mild.  Duration of 8 weeks.  Since onset it is stable. Additional comments: 8 Wk F/U OS, poss Eylea OS  Pt denies noticeable changes to [place] OU since last visit. Pt denies ocular pain, flashes of light, or floaters OU.   LBS: 157 yesterday       Last edited by Rockie Neighbours, [place] on 07/01/2020 10:17 AM. (History)      Referring physician: [name], MD [place],  [place] [id number]  HISTORICAL INFORMATION:   Selected notes from the MEDICAL RECORD NUMBER    Lab Results  Component Value Date   HGBA1C 7.9 (H) 03/17/2020     CURRENT MEDICATIONS: Current Outpatient Medications (Ophthalmic Drugs)  Medication Sig  . dorzolamide-timolol (COSOPT) 22.3-6.8 MG/ML ophthalmic solution Instill 1 drop in each eye twice a day  . DUREZOL 0.05 % EMUL  (Patient not taking: Reported on 07/01/2020)   No current facility-administered medications for this visit. (Ophthalmic Drugs)   Current Outpatient Medications (Other)  Medication Sig  . aspirin 81 MG tablet Take 1 tablet (81 mg total) by mouth daily.  . blood glucose meter kit and supplies KIT Use to test blood sugar up to 3 times daily. DX E11.9  . Calcium Carb-Cholecalciferol (CALCIUM-VITAMIN D) 500-200 MG-UNIT tablet Take 1 tablet by mouth 2 (two) times daily with a meal.  . diclofenac Sodium (VOLTAREN) 1 % GEL Apply 4 g topically 4 (four) times daily.  . empagliflozin (JARDIANCE) 25 MG TABS tablet Take 1 tablet (25 mg total) by mouth daily before  breakfast.  . glimepiride (AMARYL) 4 MG tablet TAKE 1 TABLET EVERY DAY BEFORE BREAKFAST  . glucose blood (ONETOUCH VERIO) test strip Use to test blood sugar 3 times a day  . Lancet Devices (ACCU-CHEK SOFTCLIX) lancets Use to test blood sugar up to 3 times a day. DX E11.9  . lisinopril-hydrochlorothiazide (ZESTORETIC) 20-12.5 MG tablet Take 1 tablet by mouth daily.  . metFORMIN (GLUCOPHAGE) 1000 MG tablet Take 1 tablet (1,000 mg total) by mouth 2 (two) times daily with a meal.  . Multiple Vitamin (MULTI VITAMIN DAILY PO) Take one by mouth daily  . simvastatin (ZOCOR) 20 MG tablet Take 1 tablet (20 mg total) by mouth at bedtime.   No current facility-administered medications for this visit. (Other)      REVIEW OF SYSTEMS:    ALLERGIES No Known Allergies  PAST MEDICAL HISTORY Past Medical History:  Diagnosis Date  . Anxiety   . Baker's cyst   . Chest pain   . Diabetes mellitus type 2, uncontrolled ([place])   . Diabetes mellitus without complication ([place])   . Disc disease, degenerative, cervical   . Diverticulosis of colon   . DJD (degenerative joint disease) of knee   . Facial pain, atypical   . GERD (gastroesophageal reflux disease)   . Glaucoma   . Hypercholesteremia   . Hypertension   . Lumbar spondylosis   . Obesity   .  Onychomycosis   . Osteopenia    Past Surgical History:  Procedure Laterality Date  . right eye surgery  09/2010   retina in right eye  . VAGINAL HYSTERECTOMY  2002   Dr. Kem Kays repair    FAMILY HISTORY Family History  Problem Relation Age of Onset  . Diabetes Mother   . Colon cancer Neg Hx   . Stomach cancer Neg Hx     SOCIAL HISTORY Social History   Tobacco Use  . Smoking status: Never Smoker  . Smokeless tobacco: Never Used  Vaping Use  . Vaping Use: Never used  Substance Use Topics  . Alcohol use: No  . Drug use: No         OPHTHALMIC EXAM: Base Eye Exam    Visual Acuity (ETDRS)      Right Left   Dist cc 20/30 +2 20/30  +2   Dist ph cc NI NI   Correction: Glasses       Tonometry (Tonopen, 10:18 AM)      Right Left   Pressure 14 14       Pupils      Pupils Dark Light Shape React APD   Right PERRL 2.5 2.5 Irregular Minimal None   Left PERRL 2.5 2.5 Irregular Minimal None       Visual Fields (Counting fingers)      Left Right    Full Full       Extraocular Movement      Right Left    Full Full       Neuro/Psych    Oriented x3: Yes   Mood/Affect: Normal       Dilation    Left eye: 1.0% Mydriacyl, 2.5% Phenylephrine @ 10:23 AM        Slit Lamp and Fundus Exam    External Exam      Right Left   External Normal Normal       Slit Lamp Exam      Right Left   Lids/Lashes Normal Normal   Conjunctiva/Sclera White and quiet White and quiet   Cornea Clear Clear   Anterior Chamber Deep and quiet Deep and quiet   Iris Round and reactive Round and reactive   Lens Posterior chamber intraocular lens Posterior chamber intraocular lens   Anterior Vitreous Normal Normal       Fundus Exam      Right Left   Posterior Vitreous  Normal   Disc  Normal   C/D Ratio  0.7   Macula  Microaneurysms, less Macular thickening, Cystoid macular edema, Mild clinically significant macular edema   Vessels  NPDR, moderate   Periphery  Normal          IMAGING AND PROCEDURES  Imaging and Procedures for 07/01/20  OCT, Retina - OU - Both Eyes       Right Eye Quality was good. Scan locations included subfoveal. Central Foveal Thickness: 271.   Left Eye Quality was good. Scan locations included subfoveal. Central Foveal Thickness: 238. Progression has improved.   Notes OS, area of CSME temporal to the fovea has vastly improved as compared to 8 weeks prior at the time of last in injection of Eylea OS.  OD at 2 weeks post recent injection Eylea, also with improved macular edema       Intravitreal Injection, Pharmacologic Agent - OS - Left Eye       Time Out 07/01/2020. 11:18 AM. Confirmed  correct patient, procedure, site, and patient consented.  Anesthesia Topical anesthesia was used. Anesthetic medications included Akten 3.5%.   Procedure Preparation included Tobramycin 0.3%, 10% betadine to eyelids, 5% betadine to ocular surface, Ofloxacin . A 30 gauge needle was used.   Injection:  2 mg aflibercept Alfonse Flavors) SOLN   NDC: [id number], Lot: [id number]   Route: Intravitreal, Site: Left Eye, Waste: 0 mg  Post-op Post injection exam found visual acuity of at least counting fingers. The patient tolerated the procedure well. There were no complications. The patient received written and verbal post procedure care education. Post injection medications were not given.                 ASSESSMENT/PLAN:  Moderate nonproliferative diabetic retinopathy of left eye with macular edema associated with type 2 diabetes mellitus (HCC) Improving CSME OS currently on Eylea currently on 8-week follow-up interval.  Sensitivity to antivegF has improved since patient has maintained ongoing CPAP use to prevent nightly hypoxic stress to the macular region  Moderate nonproliferative diabetic retinopathy of right eye with macular edema (HCC) CSME also improved 2 weeks post Eylea OD, follow-up as scheduled      ICD-10-CM   1. Moderate nonproliferative diabetic retinopathy of left eye with macular edema associated with type 2 diabetes mellitus (HCC)  J19.1478 OCT, Retina - OU - Both Eyes    Intravitreal Injection, Pharmacologic Agent - OS - Left Eye    aflibercept (EYLEA) SOLN 2 mg  2. Moderate nonproliferative diabetic retinopathy of right eye with macular edema associated with type 2 diabetes mellitus ([place])  E11.3311     1.  CSME OS is improving on intravitreal Eylea currently extended to 8-week interval.  We will repeat injection today and maintain 8-week interval.  2.  Follow-up dilate OD next as scheduled  3.  Ophthalmic Meds Ordered this visit:  Meds ordered this encounter   Medications  . aflibercept (EYLEA) SOLN 2 mg       Return in about 8 weeks (around 08/26/2020) for dilate, OS, EYLEA OCT.  There are no Patient Instructions on file for this visit.   Explained the diagnoses, plan, and follow up with the patient and they expressed understanding.  Patient expressed understanding of the importance of proper follow up care.   Clent Demark Rankin M.D. Diseases & Surgery of the Retina and Vitreous Retina & Diabetic [place] 07/01/20     Abbreviations: M myopia (nearsighted); A astigmatism; H hyperopia (farsighted); P presbyopia; Mrx spectacle prescription;  CTL contact lenses; OD right eye; OS left eye; OU both eyes  XT exotropia; ET esotropia; PEK punctate epithelial keratitis; PEE punctate epithelial erosions; DES dry eye syndrome; MGD meibomian gland dysfunction; ATs artificial tears; PFAT's preservative free artificial tears; [place] nuclear sclerotic cataract; PSC posterior subcapsular cataract; ERM epi-retinal membrane; PVD posterior vitreous detachment; RD retinal detachment; DM diabetes mellitus; DR diabetic retinopathy; NPDR non-proliferative diabetic retinopathy; PDR proliferative diabetic retinopathy; CSME clinically significant macular edema; DME diabetic macular edema; dbh dot blot hemorrhages; CWS cotton wool spot; POAG primary open angle glaucoma; C/D cup-to-disc ratio; HVF humphrey visual field; GVF goldmann visual field; OCT optical coherence tomography; IOP intraocular pressure; BRVO Branch retinal vein occlusion; CRVO central retinal vein occlusion; CRAO central retinal artery occlusion; BRAO branch retinal artery occlusion; RT retinal tear; SB scleral buckle; PPV pars plana vitrectomy; VH Vitreous hemorrhage; PRP panretinal laser photocoagulation; IVK intravitreal kenalog; VMT vitreomacular traction; MH Macular hole;  NVD neovascularization of the disc; NVE neovascularization elsewhere; AREDS age related eye disease study; ARMD age related macular  degeneration; POAG primary open angle glaucoma; EBMD epithelial/anterior basement membrane dystrophy; ACIOL anterior chamber intraocular lens; IOL intraocular lens; PCIOL posterior chamber intraocular lens; Phaco/IOL phacoemulsification with intraocular lens placement; [place] photorefractive keratectomy; LASIK laser assisted in situ keratomileusis; HTN hypertension; DM diabetes mellitus; COPD chronic obstructive pulmonary disease

## 2020-07-01 NOTE — Assessment & Plan Note (Signed)
CSME also improved 2 weeks post Eylea OD, follow-up as scheduled

## 2020-07-01 NOTE — Assessment & Plan Note (Signed)
Patient continues on excellent compliance with CPAP

## 2020-07-01 NOTE — Assessment & Plan Note (Signed)
Improving CSME OS currently on Eylea currently on 8-week follow-up interval.  Sensitivity to antivegF has improved since patient has maintained ongoing CPAP use to prevent nightly hypoxic stress to the macular region

## 2020-08-07 ENCOUNTER — Telehealth: Payer: Self-pay | Admitting: Internal Medicine

## 2020-08-07 MED ORDER — GLIMEPIRIDE 4 MG PO TABS: ORAL_TABLET | ORAL | 1 refills | Status: DC

## 2020-08-07 MED ORDER — SIMVASTATIN 20 MG PO TABS: 20.0000 mg | ORAL_TABLET | Freq: Every day | ORAL | 1 refills | Status: DC

## 2020-08-07 MED ORDER — LISINOPRIL-HYDROCHLOROTHIAZIDE 20-12.5 MG PO TABS: 1.0000 | ORAL_TABLET | Freq: Every day | ORAL | 1 refills | Status: DC

## 2020-08-07 MED ORDER — EMPAGLIFLOZIN 25 MG PO TABS: 25.0000 mg | ORAL_TABLET | Freq: Every day | ORAL | 3 refills | Status: DC

## 2020-08-07 NOTE — Telephone Encounter (Signed)
Medication has been sent to the patient's pharmacy.  

## 2020-08-07 NOTE — Telephone Encounter (Signed)
1.Medication Requested:glimepiride (AMARYL) 4 MG tablet  lisinopril-hydrochlorothiazide (ZESTORETIC) 20-12.5 MG tablet  metFORMIN (GLUCOPHAGE) 1000 MG tablet  simvastatin (ZOCOR) 20 MG tablet  empagliflozin (JARDIANCE) 25 MG TABS tablet    2. Pharmacy (Name, [facility] - [place], [place]  3. On Med List: yes   4. Last Visit with PCP: 03-17-20  5. Next visit date with PCP: n/a    Agent: Please be advised that RX refills may take up to 3 business days. We ask that you follow-up with your pharmacy.

## 2020-08-13 ENCOUNTER — Ambulatory Visit (INDEPENDENT_AMBULATORY_CARE_PROVIDER_SITE_OTHER): Payer: HMO | Admitting: Ophthalmology

## 2020-08-13 ENCOUNTER — Other Ambulatory Visit: Payer: Self-pay

## 2020-08-13 ENCOUNTER — Encounter (INDEPENDENT_AMBULATORY_CARE_PROVIDER_SITE_OTHER): Payer: Self-pay | Admitting: Ophthalmology

## 2020-08-13 DIAGNOSIS — E113311 Type 2 diabetes mellitus with moderate nonproliferative diabetic retinopathy with macular edema, right eye: Secondary | ICD-10-CM | POA: Diagnosis not present

## 2020-08-13 DIAGNOSIS — H35071 Retinal telangiectasis, right eye: Secondary | ICD-10-CM

## 2020-08-13 DIAGNOSIS — E113312 Type 2 diabetes mellitus with moderate nonproliferative diabetic retinopathy with macular edema, left eye: Secondary | ICD-10-CM

## 2020-08-13 MED ORDER — BEVACIZUMAB 2.5 MG/0.1ML IZ SOSY
2.5000 mg | PREFILLED_SYRINGE | INTRAVITREAL | Status: AC | PRN
  Administered 2020-08-13: 2.5 mg via INTRAVITREAL

## 2020-08-13 NOTE — Assessment & Plan Note (Signed)
OS similarly with much improved CSME overall coincident with excellent CPAP compliance use

## 2020-08-13 NOTE — Assessment & Plan Note (Signed)
Excellent compliance with CPAP, now antivegF with excellent therapeutic effect now much improved CSME at this time at 8-week interval.

## 2020-08-13 NOTE — Assessment & Plan Note (Signed)
Much improved CSME OD, with coincident compliant  CPAP use, as compared to 8 weeks previous, repeat intravitreal Avastin OD today

## 2020-08-13 NOTE — Progress Notes (Signed)
08/13/2020     CHIEF COMPLAINT Patient presents for Retina Follow Up (8 Wk F/U OD, poss Avastin OD//Pt denies noticeable changes to [place] OU since last visit. Pt denies ocular pain, flashes of light, or floaters OU. //LBS: 185 on avg recently)   HISTORY OF PRESENT ILLNESS: Whitney [name] is a 86 y.o. female who presents to the clinic today for:   HPI    Retina Follow Up    Diagnosis: Diabetic Retinopathy   Laterality: right eye   Onset: 8 weeks ago   Severity: mild   Duration: 8 weeks   Course: stable   Comments: 8 Wk F/U OD, poss Avastin OD  Pt denies noticeable changes to [place] OU since last visit. Pt denies ocular pain, flashes of light, or floaters OU.   LBS: 185 on avg recently       Last edited by Rockie Neighbours, [place] on 08/13/2020  9:23 AM. (History)      Referring physician: [name], MD [place],  [place] [address]  HISTORICAL INFORMATION:   Selected notes from the MEDICAL RECORD NUMBER    Lab Results  Component Value Date   HGBA1C 7.9 (H) 03/17/2020     CURRENT MEDICATIONS: Current Outpatient Medications (Ophthalmic Drugs)  Medication Sig  . dorzolamide-timolol (COSOPT) 22.3-6.8 MG/ML ophthalmic solution Instill 1 drop in each eye twice a day  . DUREZOL 0.05 % EMUL  (Patient not taking: Reported on 07/01/2020)   No current facility-administered medications for this visit. (Ophthalmic Drugs)   Current Outpatient Medications (Other)  Medication Sig  . aspirin 81 MG tablet Take 1 tablet (81 mg total) by mouth daily.  . blood glucose meter kit and supplies KIT Use to test blood sugar up to 3 times daily. DX E11.9  . Calcium Carb-Cholecalciferol (CALCIUM-VITAMIN D) 500-200 MG-UNIT tablet Take 1 tablet by mouth 2 (two) times daily with a meal.  . diclofenac Sodium (VOLTAREN) 1 % GEL Apply 4 g topically 4 (four) times daily.  . empagliflozin (JARDIANCE) 25 MG TABS tablet Take 1 tablet (25 mg total) by mouth daily before breakfast.  .  glimepiride (AMARYL) 4 MG tablet TAKE 1 TABLET EVERY DAY BEFORE BREAKFAST  . glucose blood (ONETOUCH VERIO) test strip Use to test blood sugar 3 times a day  . Lancet Devices (ACCU-CHEK SOFTCLIX) lancets Use to test blood sugar up to 3 times a day. DX E11.9  . lisinopril-hydrochlorothiazide (ZESTORETIC) 20-12.5 MG tablet Take 1 tablet by mouth daily.  . metFORMIN (GLUCOPHAGE) 1000 MG tablet Take 1 tablet (1,000 mg total) by mouth 2 (two) times daily with a meal.  . Multiple Vitamin (MULTI VITAMIN DAILY PO) Take one by mouth daily  . simvastatin (ZOCOR) 20 MG tablet Take 1 tablet (20 mg total) by mouth at bedtime.   No current facility-administered medications for this visit. (Other)      REVIEW OF SYSTEMS:    ALLERGIES No Known Allergies  PAST MEDICAL HISTORY Past Medical History:  Diagnosis Date  . Anxiety   . Baker's cyst   . Chest pain   . Diabetes mellitus type 2, uncontrolled ([place])   . Diabetes mellitus without complication ([place])   . Disc disease, degenerative, cervical   . Diverticulosis of colon   . DJD (degenerative joint disease) of knee   . Facial pain, atypical   . GERD (gastroesophageal reflux disease)   . Glaucoma   . Hypercholesteremia   . Hypertension   . Lumbar spondylosis   .  Obesity   . Onychomycosis   . Osteopenia    Past Surgical History:  Procedure Laterality Date  . right eye surgery  09/2010   retina in right eye  . VAGINAL HYSTERECTOMY  2002   Dr. Kem Kays repair    FAMILY HISTORY Family History  Problem Relation Age of Onset  . Diabetes Mother   . Colon cancer Neg Hx   . Stomach cancer Neg Hx     SOCIAL HISTORY Social History   Tobacco Use  . Smoking status: Never Smoker  . Smokeless tobacco: Never Used  Vaping Use  . Vaping Use: Never used  Substance Use Topics  . Alcohol use: No  . Drug use: No         OPHTHALMIC EXAM: Base Eye Exam    Visual Acuity (ETDRS)      Right Left   Dist cc 20/25 -2 20/25 +2    Correction: Glasses       Tonometry (Tonopen, 9:27 AM)      Right Left   Pressure 12 13       Pupils      Pupils Dark Light Shape React APD   Right PERRL 3 3 Irregular Minimal None   Left PERRL 3 3 Irregular Minimal None       Visual Fields (Counting fingers)      Left Right    Full Full       Extraocular Movement      Right Left    Full Full       Neuro/Psych    Oriented x3: Yes   Mood/Affect: Normal       Dilation    Right eye: 1.0% Mydriacyl, 2.5% Phenylephrine @ 9:27 AM        Slit Lamp and Fundus Exam    External Exam      Right Left   External Normal Normal       Slit Lamp Exam      Right Left   Lids/Lashes Normal Normal   Conjunctiva/Sclera White and quiet White and quiet   Cornea Clear Clear   Anterior Chamber Deep and quiet Deep and quiet   Iris Round and reactive Round and reactive   Lens Posterior chamber intraocular lens Posterior chamber intraocular lens   Anterior Vitreous Normal Normal       Fundus Exam      Right Left   Posterior Vitreous Normal    Disc Normal    C/D Ratio 0.55    Macula Microaneurysms,, trace clinical CSME    Vessels NPDR, moderate    Periphery Normal           IMAGING AND PROCEDURES  Imaging and Procedures for 08/13/20  OCT, Retina - OU - Both Eyes       Right Eye Quality was good. Scan locations included subfoveal. Central Foveal Thickness: 260.   Left Eye Quality was good. Scan locations included subfoveal. Central Foveal Thickness: 230. Progression has improved.   Notes OS, area of CSME temporal to the fovea has vastly improved as compared to 8 weeks prior at the time of last in injection of Eylea OS.  OD at 8  weeks post recent injection Eylea, also with improved macular edema.  Improved OD at 8-week interval as compared to June 17, 2020, coincident with excellent CPAP compliance       Intravitreal Injection, Pharmacologic Agent - OD - Right Eye       Time Out 08/13/2020. 10:40 AM. Confirmed  correct patient, procedure, site, and patient consented.   Anesthesia Topical anesthesia was used. Anesthetic medications included [place] 3.5%.   Procedure Preparation included 10% betadine to eyelids, 5% betadine to ocular surface, Ofloxacin . A 30 gauge needle was used.   Injection:  2.5 mg Bevacizumab (AVASTIN) 2.18m/0.1mL SOSY   NDC:: 23762-831-51 Lot:: [id number]  Route: Intravitreal, Site: Right Eye  Post-op Post injection exam found visual acuity of at least counting fingers. The patient tolerated the procedure well. There were no complications. The patient received written and verbal post procedure care education. Post injection medications were not given.                 ASSESSMENT/PLAN:  Retinal telangiectasia of right eye Excellent compliance with CPAP, now antivegF with excellent therapeutic effect now much improved CSME at this time at 8-week interval.  Moderate nonproliferative diabetic retinopathy of left eye with macular edema associated with type 2 diabetes mellitus (HCC) OS similarly with much improved CSME overall coincident with excellent CPAP compliance use  Moderate nonproliferative diabetic retinopathy of right eye with macular edema (HCC) Much improved CSME OD, with coincident compliant  CPAP use, as compared to 8 weeks previous, repeat intravitreal Avastin OD today      ICD-10-CM   1. Moderate nonproliferative diabetic retinopathy of right eye with macular edema associated with type 2 diabetes mellitus (HCC)  E11.3311 OCT, Retina - OU - Both Eyes    Intravitreal Injection, Pharmacologic Agent - OD - Right Eye    bevacizumab (AVASTIN) SOSY 2.5 mg  2. Retinal telangiectasia of right eye  H35.071   3. Moderate nonproliferative diabetic retinopathy of left eye with macular edema associated with type 2 diabetes mellitus ([place]  EX10.6269    1.  Vastly improved response to antivegF now with compliant CPAP use for diagnosis of sleep apnea.  CSME OD now much  improved at 8-week interval on intravitreal Avastin.  We will repeat today and follow-up again in 8 weeks  2.  Follow-up OS as scheduled  3.  Ophthalmic Meds Ordered this visit:  Meds ordered this encounter  Medications  . bevacizumab (AVASTIN) SOSY 2.5 mg       Return in about 8 weeks (around 10/08/2020) for dilate, OD, AVASTIN OCT.  There are no Patient Instructions on file for this visit.   Explained the diagnoses, plan, and follow up with the patient and they expressed understanding.  Patient expressed understanding of the importance of proper follow up care.   GClent DemarkRankin M.D. Diseases & Surgery of the Retina and Vitreous Retina & Diabetic EL[place]05/04/22     Abbreviations: M myopia (nearsighted); A astigmatism; H hyperopia (farsighted); P presbyopia; Mrx spectacle prescription;  CTL contact lenses; OD right eye; OS left eye; OU both eyes  XT exotropia; ET esotropia; PEK punctate epithelial keratitis; PEE punctate epithelial erosions; DES dry eye syndrome; MGD meibomian gland dysfunction; ATs artificial tears; PFAT's preservative free artificial tears; NS[place]nuclear sclerotic cataract; PSC posterior subcapsular cataract; ERM epi-retinal membrane; PVD posterior vitreous detachment; RD retinal detachment; DM diabetes mellitus; DR diabetic retinopathy; NPDR non-proliferative diabetic retinopathy; PDR proliferative diabetic retinopathy; CSME clinically significant macular edema; DME diabetic macular edema; dbh dot blot hemorrhages; CWS cotton wool spot; POAG primary open angle glaucoma; C/D cup-to-disc ratio; HVF humphrey visual field; GVF goldmann visual field; OCT optical coherence tomography; IOP intraocular pressure; BRVO Branch retinal vein occlusion; CRVO central retinal vein occlusion; CRAO central retinal artery occlusion; BRAO branch retinal artery occlusion; RT  retinal tear; SB scleral buckle; PPV pars plana vitrectomy; VH Vitreous hemorrhage; PRP panretinal laser  photocoagulation; IVK intravitreal kenalog; VMT vitreomacular traction; MH Macular hole;  NVD neovascularization of the disc; NVE neovascularization elsewhere; AREDS age related eye disease study; ARMD age related macular degeneration; POAG primary open angle glaucoma; EBMD epithelial/anterior basement membrane dystrophy; ACIOL anterior chamber intraocular lens; IOL intraocular lens; PCIOL posterior chamber intraocular lens; Phaco/IOL phacoemulsification with intraocular lens placement; [place] photorefractive keratectomy; LASIK laser assisted in situ keratomileusis; HTN hypertension; DM diabetes mellitus; COPD chronic obstructive pulmonary disease

## 2020-08-20 ENCOUNTER — Telehealth: Payer: Self-pay | Admitting: Pharmacist

## 2020-08-20 NOTE — Progress Notes (Signed)
Chronic Care Management Pharmacy Assistant   Name: Whitney [name]  MRN: [id number] DOB: 1934-05-01    Reason for Encounter: Disease State Hypertension Call   Conditions to be addressed/monitored: HTN   Recent office visits:  None ID  Recent consult visits:  05/06/20 Dr. Deloria Lair 06/17/20   Hospital visits:  None in previous 6 months  Medications: Outpatient Encounter Medications as of 08/20/2020  Medication Sig Note  . aspirin 81 MG tablet Take 1 tablet (81 mg total) by mouth daily.   . blood glucose meter kit and supplies KIT Use to test blood sugar up to 3 times daily. DX E11.9   . Calcium Carb-Cholecalciferol (CALCIUM-VITAMIN D) 500-200 MG-UNIT tablet Take 1 tablet by mouth 2 (two) times daily with a meal.   . diclofenac Sodium (VOLTAREN) 1 % GEL Apply 4 g topically 4 (four) times daily.   . dorzolamide-timolol (COSOPT) 22.3-6.8 MG/ML ophthalmic solution Instill 1 drop in each eye twice a day   . DUREZOL 0.05 % EMUL  (Patient not taking: Reported on 07/01/2020) 03/17/2015: Received from: External Pharmacy  . empagliflozin (JARDIANCE) 25 MG TABS tablet Take 1 tablet (25 mg total) by mouth daily before breakfast.   . glimepiride (AMARYL) 4 MG tablet TAKE 1 TABLET EVERY DAY BEFORE BREAKFAST   . glucose blood (ONETOUCH VERIO) test strip Use to test blood sugar 3 times a day   . Lancet Devices (ACCU-CHEK SOFTCLIX) lancets Use to test blood sugar up to 3 times a day. DX E11.9   . lisinopril-hydrochlorothiazide (ZESTORETIC) 20-12.5 MG tablet Take 1 tablet by mouth daily.   . metFORMIN (GLUCOPHAGE) 1000 MG tablet Take 1 tablet (1,000 mg total) by mouth 2 (two) times daily with a meal.   . Multiple Vitamin (MULTI VITAMIN DAILY PO) Take one by mouth daily 06/19/2013: Received from: External Pharmacy  . simvastatin (ZOCOR) 20 MG tablet Take 1 tablet (20 mg total) by mouth at bedtime.    No facility-administered encounter medications on file as of 08/20/2020.   Reviewed chart  prior to disease state call. Spoke with patient regarding BP  Recent Office Vitals: BP Readings from Last 3 Encounters:  05/15/20 125/71  03/17/20 (!) 168/80  12/14/19 130/70   Pulse Readings from Last 3 Encounters:  05/15/20 83  03/17/20 62  12/14/19 (!) 57    Wt Readings from Last 3 Encounters:  05/15/20 154 lb (69.9 kg)  03/17/20 145 lb (65.8 kg)  12/14/19 156 lb (70.8 kg)     Kidney Function Lab Results  Component Value Date/Time   CREATININE 0.81 03/17/2020 09:50 AM   CREATININE 0.77 09/04/2019 02:40 PM   GFR 67.28 03/17/2020 09:50 AM   GFRNONAA 87.17 02/16/2010 10:03 AM   GFRAA 156 02/19/2008 11:03 AM    BMP Latest Ref Rng & Units 03/17/2020 09/04/2019 09/29/2018  Glucose 70 - 99 mg/dL 272(H) 238(H) 149(H)  BUN 6 - 23 mg/dL _0 Creatinine 0.40 - 1.20 mg/dL 0.81 0.77 0.75  Sodium 135 - 145 mEq/L 138 138 142  Potassium 3.5 - 5.1 mEq/L 3.8 3.4(L) 4.1  Chloride 96 - 112 mEq/L 100 101 105  CO2 19 - 32 mEq/L _1 Calcium 8.4 - 10.5 mg/dL 10.6(H) 10.3 10.1    . Current antihypertensive regimen:  Lisinopril-hctz 20-12.5 mg daily  . How often are you checking your Blood Pressure? patient states that she does not take blood pressure but would like to see if  she could get a prescription  for a blood pressure cuff   . Current home BP readings: Patient states last blood pressure reading was 141/71 on 08/09/20  . What recent interventions/DTPs have been made by any provider to improve Blood Pressure control since last CPP Visit: Dicussed blood pressure goals to prevent heart attack and stroke  . Any recent hospitalizations or ED visits since last visit with CPP? No   . What diet changes have been made to improve Blood Pressure Control?  Patient states that she has not had any changes in diet  . What exercise is being done to improve your Blood Pressure Control?  Patient states that she exercises for senior on the television at least 3 times a week  Adherence  Review: Is the patient currently on ACE/ARB medication? Yes Does the patient have >5 day gap between last estimated fill dates? No     Star Rating Drugs: Lisinopril -hctz 08/07/20 90 ds Simvastatin 08/07/20 90 ds  [place] Pharmacist Assistant [phone]  Time spent:36

## 2020-08-26 ENCOUNTER — Encounter (INDEPENDENT_AMBULATORY_CARE_PROVIDER_SITE_OTHER): Payer: HMO | Admitting: Ophthalmology

## 2020-08-26 NOTE — Progress Notes (Signed)
Made a call to Ms. Erby this morning letting her know that medicare does not pay for blood pressure cuff. Told the patient that if she is needing one she can look at [facility] price may be between $20-$30. Patient states that she may look at [facility] or [facility].  [place] Pharmacist Assistant [phone]  Time spent:6

## 2020-08-28 ENCOUNTER — Telehealth: Payer: Self-pay | Admitting: Internal Medicine

## 2020-08-28 MED ORDER — METFORMIN HCL 1000 MG PO TABS: 1000.0000 mg | ORAL_TABLET | Freq: Two times a day (BID) | ORAL | 1 refills | Status: DC

## 2020-08-28 MED ORDER — DORZOLAMIDE HCL-TIMOLOL MAL 2-0.5 % OP SOLN: OPHTHALMIC | 1 refills | Status: DC

## 2020-08-28 NOTE — Telephone Encounter (Signed)
dorzolamide-timolol (COSOPT) 22.3-6.8 MG/ML ophthalmic solution metFORMIN (GLUCOPHAGE) 1000 MG tablet [facility] ([place], [place] Phone:  [phone]  Fax:  [phone]     Last seen- 12.06.21 Next apt- n/a

## 2020-08-28 NOTE — Telephone Encounter (Signed)
Medication has been sent to the patient's pharmacy.  

## 2020-09-01 ENCOUNTER — Ambulatory Visit (INDEPENDENT_AMBULATORY_CARE_PROVIDER_SITE_OTHER): Payer: HMO | Admitting: Ophthalmology

## 2020-09-01 ENCOUNTER — Encounter (INDEPENDENT_AMBULATORY_CARE_PROVIDER_SITE_OTHER): Payer: Self-pay | Admitting: Ophthalmology

## 2020-09-01 ENCOUNTER — Other Ambulatory Visit: Payer: Self-pay

## 2020-09-01 DIAGNOSIS — E113312 Type 2 diabetes mellitus with moderate nonproliferative diabetic retinopathy with macular edema, left eye: Secondary | ICD-10-CM | POA: Diagnosis not present

## 2020-09-01 DIAGNOSIS — H35072 Retinal telangiectasis, left eye: Secondary | ICD-10-CM | POA: Diagnosis not present

## 2020-09-01 DIAGNOSIS — E113311 Type 2 diabetes mellitus with moderate nonproliferative diabetic retinopathy with macular edema, right eye: Secondary | ICD-10-CM

## 2020-09-01 MED ORDER — AFLIBERCEPT 2MG/0.05ML IZ SOLN FOR KALEIDOSCOPE
2.0000 mg | INTRAVITREAL | Status: AC | PRN
  Administered 2020-09-01: 2 mg via INTRAVITREAL

## 2020-09-01 NOTE — Assessment & Plan Note (Signed)
Follow up as scheduled.  

## 2020-09-01 NOTE — Progress Notes (Signed)
09/01/2020     CHIEF COMPLAINT Patient presents for Retina Evaluation   HISTORY OF PRESENT ILLNESS: Whitney [name] is a 86 y.o. female who presents to the clinic today for:   HPI    Retina Evaluation    Laterality: left eye          Comments    OS with diabetic CSME       Last edited by Hurman Horn, MD on 09/01/2020  9:33 AM. (History)      Referring physician: [name], MD [place],  [place] [id number]  HISTORICAL INFORMATION:   Selected notes from the MEDICAL RECORD NUMBER    Lab Results  Component Value Date   HGBA1C 7.9 (H) 03/17/2020     CURRENT MEDICATIONS: Current Outpatient Medications (Ophthalmic Drugs)  Medication Sig  . dorzolamide-timolol (COSOPT) 22.3-6.8 MG/ML ophthalmic solution Instill 1 drop in each eye twice a day  . DUREZOL 0.05 % EMUL  (Patient not taking: Reported on 07/01/2020)   No current facility-administered medications for this visit. (Ophthalmic Drugs)   Current Outpatient Medications (Other)  Medication Sig  . aspirin 81 MG tablet Take 1 tablet (81 mg total) by mouth daily.  . blood glucose meter kit and supplies KIT Use to test blood sugar up to 3 times daily. DX E11.9  . Calcium Carb-Cholecalciferol (CALCIUM-VITAMIN D) 500-200 MG-UNIT tablet Take 1 tablet by mouth 2 (two) times daily with a meal.  . diclofenac Sodium (VOLTAREN) 1 % GEL Apply 4 g topically 4 (four) times daily.  . empagliflozin (JARDIANCE) 25 MG TABS tablet Take 1 tablet (25 mg total) by mouth daily before breakfast.  . glimepiride (AMARYL) 4 MG tablet TAKE 1 TABLET EVERY DAY BEFORE BREAKFAST  . glucose blood (ONETOUCH VERIO) test strip Use to test blood sugar 3 times a day  . Lancet Devices (ACCU-CHEK SOFTCLIX) lancets Use to test blood sugar up to 3 times a day. DX E11.9  . lisinopril-hydrochlorothiazide (ZESTORETIC) 20-12.5 MG tablet Take 1 tablet by mouth daily.  . metFORMIN (GLUCOPHAGE) 1000 MG tablet Take 1 tablet (1,000 mg  total) by mouth 2 (two) times daily with a meal.  . Multiple Vitamin (MULTI VITAMIN DAILY PO) Take one by mouth daily  . simvastatin (ZOCOR) 20 MG tablet Take 1 tablet (20 mg total) by mouth at bedtime.   No current facility-administered medications for this visit. (Other)      REVIEW OF SYSTEMS:    ALLERGIES No Known Allergies  PAST MEDICAL HISTORY Past Medical History:  Diagnosis Date  . Anxiety   . Baker's cyst   . Chest pain   . Diabetes mellitus type 2, uncontrolled ([place])   . Diabetes mellitus without complication ([place])   . Disc disease, degenerative, cervical   . Diverticulosis of colon   . DJD (degenerative joint disease) of knee   . Facial pain, atypical   . GERD (gastroesophageal reflux disease)   . Glaucoma   . Hypercholesteremia   . Hypertension   . Lumbar spondylosis   . Obesity   . Onychomycosis   . Osteopenia    Past Surgical History:  Procedure Laterality Date  . right eye surgery  09/2010   retina in right eye  . VAGINAL HYSTERECTOMY  2002   Dr. Kem Kays repair    FAMILY HISTORY Family History  Problem Relation Age of Onset  . Diabetes Mother   . Colon cancer Neg Hx   . Stomach cancer Neg Hx  SOCIAL HISTORY Social History   Tobacco Use  . Smoking status: Never Smoker  . Smokeless tobacco: Never Used  Vaping Use  . Vaping Use: Never used  Substance Use Topics  . Alcohol use: No  . Drug use: No         OPHTHALMIC EXAM: Base Eye Exam    Visual Acuity (ETDRS)      Right Left   Dist cc 20/25 20/20       Tonometry (Tonopen, 9:32 AM)      Right Left   Pressure 12 14       Neuro/Psych    Oriented x3: Yes   Mood/Affect: Normal        Slit Lamp and Fundus Exam    External Exam      Right Left   External Normal Normal       Slit Lamp Exam      Right Left   Lids/Lashes Normal Normal   Conjunctiva/Sclera White and quiet White and quiet   Cornea Clear Clear   Anterior Chamber Deep and quiet Deep and quiet   Iris  Round and reactive Round and reactive   Lens Posterior chamber intraocular lens Posterior chamber intraocular lens   Anterior Vitreous Normal Normal       Fundus Exam      Right Left   Posterior Vitreous  Normal   Disc  Normal   C/D Ratio  0.7   Macula  Microaneurysms, less Macular thickening, Cystoid macular edema, Mild clinically significant macular edema   Vessels  NPDR, moderate   Periphery  Normal          IMAGING AND PROCEDURES  Imaging and Procedures for 09/01/20  Intravitreal Injection, Pharmacologic Agent - OS - Left Eye       Time Out 09/01/2020. 10:26 AM. Confirmed correct patient, procedure, site, and patient consented.   Anesthesia Topical anesthesia was used. Anesthetic medications included Akten 3.5%.   Procedure Preparation included Tobramycin 0.3%, 10% betadine to eyelids, 5% betadine to ocular surface, Ofloxacin . A 30 gauge needle was used.   Injection:  2 mg aflibercept Alfonse Flavors) SOLN   NDC: [id number], Lot: [id number]   Route: Intravitreal, Site: Left Eye, Waste: 0 mg  Post-op Post injection exam found visual acuity of at least counting fingers. The patient tolerated the procedure well. There were no complications. The patient received written and verbal post procedure care education. Post injection medications were not given.        OCT, Retina - OU - Both Eyes       Right Eye Quality was good. Scan locations included subfoveal. Central Foveal Thickness: 256. Progression has been stable. Findings include abnormal foveal contour.   Left Eye Quality was good. Scan locations included subfoveal. Central Foveal Thickness: 246. Progression has been stable. Findings include abnormal foveal contour.   Notes OS, area of CSME temporal to the fovea has vastly improved as compared to 8 weeks prior at the time of last in injection of Eylea OS.  OS for planned Eylea injection today, and extend interval to 9 weeks  OD at 3  weeks post recent injection  Eylea, also with improved macular edema.  Improved OD at 8-week interval as compared to June 17, 2020, coincident with excellent CPAP compliance                ASSESSMENT/PLAN:  Moderate nonproliferative diabetic retinopathy of left eye with macular edema associated with type 2 diabetes mellitus ([place]) Vastly improved CSME  over time and on therapy, currently follow-up 8-week examination today [place] involvement is no longer present since patient is concomitantly treating nightly hypoxic maculopathy with CPAP from previous unrecognized sleep apnea.  Now injections of antivegF have adequate improper effect on preventing CSME extension into the macula.  Some of the chronic changes temporally may be in fact chronic thus we will treat today and extend interval examination next to 9 weeks  Moderate nonproliferative diabetic retinopathy of right eye with macular edema (HCC) Follow-up as scheduled  Type 2 macular telangiectasis, left Patient remains compliant with nightly CPAP sense of wellbeing has continued to remain excellent improved overall      ICD-10-CM   1. Moderate nonproliferative diabetic retinopathy of left eye with macular edema associated with type 2 diabetes mellitus (HCC)  P32.9518 Intravitreal Injection, Pharmacologic Agent - OS - Left Eye    OCT, Retina - OU - Both Eyes    aflibercept (EYLEA) SOLN 2 mg  2. Moderate nonproliferative diabetic retinopathy of right eye with macular edema associated with type 2 diabetes mellitus ([place])  E11.3311   3. Type 2 macular telangiectasis, left  H35.072     1.  Bilateral CSME made worse in the past by undiagnosed night time sleep apnea induced macular hypoxia.  Now improved overall on CPAP and now the macula and macular leaking vessels are more responsive to intravitreal therapy.  Acuity stabilized  2.  Repeat injection intravitreal Eylea OS today and examination OS next in 9 weeks  3.  Ophthalmic Meds Ordered this visit:  Meds  ordered this encounter  Medications  . aflibercept (EYLEA) SOLN 2 mg       Return in about 9 weeks (around 11/03/2020) for dilate, OS, EYLEA OCT.  There are no Patient Instructions on file for this visit.   Explained the diagnoses, plan, and follow up with the patient and they expressed understanding.  Patient expressed understanding of the importance of proper follow up care.   Clent Demark Dellamae Rosamilia M.D. Diseases & Surgery of the Retina and Vitreous Retina & Diabetic [place] 09/01/20     Abbreviations: M myopia (nearsighted); A astigmatism; H hyperopia (farsighted); P presbyopia; Mrx spectacle prescription;  CTL contact lenses; OD right eye; OS left eye; OU both eyes  XT exotropia; ET esotropia; PEK punctate epithelial keratitis; PEE punctate epithelial erosions; DES dry eye syndrome; MGD meibomian gland dysfunction; ATs artificial tears; PFAT's preservative free artificial tears; [place] nuclear sclerotic cataract; PSC posterior subcapsular cataract; ERM epi-retinal membrane; PVD posterior vitreous detachment; RD retinal detachment; DM diabetes mellitus; DR diabetic retinopathy; NPDR non-proliferative diabetic retinopathy; PDR proliferative diabetic retinopathy; CSME clinically significant macular edema; DME diabetic macular edema; dbh dot blot hemorrhages; CWS cotton wool spot; POAG primary open angle glaucoma; C/D cup-to-disc ratio; HVF humphrey visual field; GVF goldmann visual field; OCT optical coherence tomography; IOP intraocular pressure; BRVO Branch retinal vein occlusion; CRVO central retinal vein occlusion; CRAO central retinal artery occlusion; BRAO branch retinal artery occlusion; RT retinal tear; SB scleral buckle; PPV pars plana vitrectomy; VH Vitreous hemorrhage; PRP panretinal laser photocoagulation; IVK intravitreal kenalog; VMT vitreomacular traction; MH Macular hole;  NVD neovascularization of the disc; NVE neovascularization elsewhere; AREDS age related eye disease study; ARMD  age related macular degeneration; POAG primary open angle glaucoma; EBMD epithelial/anterior basement membrane dystrophy; ACIOL anterior chamber intraocular lens; IOL intraocular lens; PCIOL posterior chamber intraocular lens; Phaco/IOL phacoemulsification with intraocular lens placement; [place] photorefractive keratectomy; LASIK laser assisted in situ keratomileusis; HTN hypertension; DM diabetes mellitus; COPD  chronic obstructive pulmonary disease

## 2020-09-01 NOTE — Assessment & Plan Note (Signed)
Patient remains compliant with nightly CPAP sense of wellbeing has continued to remain excellent improved overall

## 2020-09-01 NOTE — Assessment & Plan Note (Addendum)
Vastly improved CSME over time and on therapy, currently follow-up 8-week examination today [place] involvement is no longer present since patient is concomitantly treating nightly hypoxic maculopathy with CPAP from previous unrecognized sleep apnea.  Now injections of antivegF have adequate improper effect on preventing CSME extension into the macula.  Some of the chronic changes temporally may be in fact chronic thus we will treat today and extend interval examination next to 9 weeks

## 2020-09-04 DIAGNOSIS — G4733 Obstructive sleep apnea (adult) (pediatric): Secondary | ICD-10-CM | POA: Diagnosis not present

## 2020-10-08 ENCOUNTER — Ambulatory Visit (INDEPENDENT_AMBULATORY_CARE_PROVIDER_SITE_OTHER): Payer: HMO | Admitting: Ophthalmology

## 2020-10-08 ENCOUNTER — Encounter (INDEPENDENT_AMBULATORY_CARE_PROVIDER_SITE_OTHER): Payer: Self-pay | Admitting: Ophthalmology

## 2020-10-08 ENCOUNTER — Other Ambulatory Visit: Payer: Self-pay

## 2020-10-08 DIAGNOSIS — E113311 Type 2 diabetes mellitus with moderate nonproliferative diabetic retinopathy with macular edema, right eye: Secondary | ICD-10-CM

## 2020-10-08 DIAGNOSIS — E113312 Type 2 diabetes mellitus with moderate nonproliferative diabetic retinopathy with macular edema, left eye: Secondary | ICD-10-CM

## 2020-10-08 DIAGNOSIS — H35071 Retinal telangiectasis, right eye: Secondary | ICD-10-CM

## 2020-10-08 MED ORDER — BEVACIZUMAB 2.5 MG/0.1ML IZ SOSY
2.5000 mg | PREFILLED_SYRINGE | INTRAVITREAL | Status: AC | PRN
  Administered 2020-10-08: 2.5 mg via INTRAVITREAL

## 2020-10-08 NOTE — Progress Notes (Signed)
10/08/2020     CHIEF COMPLAINT Patient presents for Retina Follow Up (8 week fu OD and Avastin OD/Pt states VA OU stable since last visit. Pt denies FOL, floaters, or ocular pain OU. /Pt reports using Cosopt BID OU/A1C:7.9/LBS: 105)   HISTORY OF PRESENT ILLNESS: Whitney [name] is a 86 y.o. female who presents to the clinic today for:   HPI     Retina Follow Up           Diagnosis: Diabetic Retinopathy   Laterality: right eye   Onset: 8 weeks ago   Severity: mild   Duration: 8 weeks   Course: stable   Comments: 8 week fu OD and Avastin OD Pt states VA OU stable since last visit. Pt denies FOL, floaters, or ocular pain OU.  Pt reports using Cosopt BID OU A1C:7.9 LBS: 105         Comments   Patient reports for the last 2 weeks not using CPAP effectively because of mask leakage this is coincident with significant increase in CSME CME at 8-week interval today post Avastin      Last edited by Hurman Horn, MD on 10/08/2020 10:53 AM.      Referring physician: [name], MD [place],  [place] [id number]  HISTORICAL INFORMATION:   Selected notes from the MEDICAL RECORD NUMBER    Lab Results  Component Value Date   HGBA1C 7.9 (H) 03/17/2020     CURRENT MEDICATIONS: Current Outpatient Medications (Ophthalmic Drugs)  Medication Sig   dorzolamide-timolol (COSOPT) 22.3-6.8 MG/ML ophthalmic solution Instill 1 drop in each eye twice a day   DUREZOL 0.05 % EMUL  (Patient not taking: Reported on 07/01/2020)   No current facility-administered medications for this visit. (Ophthalmic Drugs)   Current Outpatient Medications (Other)  Medication Sig   aspirin 81 MG tablet Take 1 tablet (81 mg total) by mouth daily.   blood glucose meter kit and supplies KIT Use to test blood sugar up to 3 times daily. DX E11.9   Calcium Carb-Cholecalciferol (CALCIUM-VITAMIN D) 500-200 MG-UNIT tablet Take 1 tablet by mouth 2 (two) times daily with a meal.    diclofenac Sodium (VOLTAREN) 1 % GEL Apply 4 g topically 4 (four) times daily.   empagliflozin (JARDIANCE) 25 MG TABS tablet Take 1 tablet (25 mg total) by mouth daily before breakfast.   glimepiride (AMARYL) 4 MG tablet TAKE 1 TABLET EVERY DAY BEFORE BREAKFAST   glucose blood (ONETOUCH VERIO) test strip Use to test blood sugar 3 times a day   Lancet Devices (ACCU-CHEK SOFTCLIX) lancets Use to test blood sugar up to 3 times a day. DX E11.9   lisinopril-hydrochlorothiazide (ZESTORETIC) 20-12.5 MG tablet Take 1 tablet by mouth daily.   metFORMIN (GLUCOPHAGE) 1000 MG tablet Take 1 tablet (1,000 mg total) by mouth 2 (two) times daily with a meal.   Multiple Vitamin (MULTI VITAMIN DAILY PO) Take one by mouth daily   simvastatin (ZOCOR) 20 MG tablet Take 1 tablet (20 mg total) by mouth at bedtime.   No current facility-administered medications for this visit. (Other)      REVIEW OF SYSTEMS:    ALLERGIES No Known Allergies  PAST MEDICAL HISTORY Past Medical History:  Diagnosis Date   Anxiety    Baker's cyst    Chest pain    Diabetes mellitus type 2, uncontrolled ([place])    Diabetes mellitus without complication (HCC)    Disc disease, degenerative, cervical    Diverticulosis  of colon    DJD (degenerative joint disease) of knee    Facial pain, atypical    GERD (gastroesophageal reflux disease)    Glaucoma    Hypercholesteremia    Hypertension    Lumbar spondylosis    Obesity    Onychomycosis    Osteopenia    Past Surgical History:  Procedure Laterality Date   right eye surgery  09/2010   retina in right eye   VAGINAL HYSTERECTOMY  2002   Dr. Kem Kays repair    FAMILY HISTORY Family History  Problem Relation Age of Onset   Diabetes Mother    Colon cancer Neg Hx    Stomach cancer Neg Hx     SOCIAL HISTORY Social History   Tobacco Use   Smoking status: Never   Smokeless tobacco: Never  Vaping Use   Vaping Use: Never used  Substance Use Topics   Alcohol use: No    Drug use: No         OPHTHALMIC EXAM:  Base Eye Exam     Visual Acuity (ETDRS)       Right Left   Dist cc 20/30 -2 20/25 -2   Dist ph cc NI     Correction: Glasses         Tonometry (Tonopen, 9:57 AM)       Right Left   Pressure 10 08         Pupils       Pupils Dark Light Shape React APD   Right PERRL 3 3 Irregular Minimal None   Left PERRL 3 3 Irregular Minimal None         Visual Fields (Counting fingers)       Left Right    Full Full         Extraocular Movement       Right Left    Full Full         Neuro/Psych     Oriented x3: Yes   Mood/Affect: Normal         Dilation     Right eye: 1.0% Mydriacyl, 2.5% Phenylephrine @ 9:57 AM           Slit Lamp and Fundus Exam     External Exam       Right Left   External Normal Normal         Slit Lamp Exam       Right Left   Lids/Lashes Normal Normal   Conjunctiva/Sclera White and quiet White and quiet   Cornea Clear Clear   Anterior Chamber Deep and quiet Deep and quiet   Iris Round and reactive Round and reactive   Lens Posterior chamber intraocular lens Posterior chamber intraocular lens   Anterior Vitreous Normal Normal         Fundus Exam       Right Left   Posterior Vitreous Normal    Disc Normal    C/D Ratio 0.55    Macula Microaneurysms,, trace clinical CSME    Vessels NPDR, moderate    Periphery Normal             IMAGING AND PROCEDURES  Imaging and Procedures for 10/08/20  OCT, Retina - OU - Both Eyes       Right Eye Quality was good. Scan locations included subfoveal. Central Foveal Thickness: 447. Progression has been stable. Findings include abnormal foveal contour, cystoid macular edema.   Left Eye Quality was good. Scan locations included subfoveal. Central  Foveal Thickness: 238. Progression has been stable. Findings include abnormal foveal contour.   Notes OS, area of CSME temporal to the fovea has vastly improved as compared to  5  weeks prior at the time of last in injection of Eylea OS.  OS for planned Eylea injection today, and extend interval to 9 weeks  OD at 8  weeks post recent injection Avastin also with improved macular edema.  Previously improved, at similar interval now worse coincident with last 3 weeks unsuccessful use of CPAP due to equipment failure     Intravitreal Injection, Pharmacologic Agent - OD - Right Eye       Time Out 10/08/2020. 10:58 AM. Confirmed correct patient, procedure, site, and patient consented.   Anesthesia Topical anesthesia was used. Anesthetic medications included Akten 3.5%.   Procedure Preparation included 10% betadine to eyelids, 5% betadine to ocular surface, Ofloxacin . A 30 gauge needle was used.   Injection: 2.5 mg bevacizumab 2.5 MG/0.1ML   Route: Intravitreal, Site: Right Eye   NDC: [phone], Lot: [id number]   Post-op Post injection exam found visual acuity of at least counting fingers. The patient tolerated the procedure well. There were no complications. The patient received written and verbal post procedure care education. Post injection medications were not given.              ASSESSMENT/PLAN:  Moderate nonproliferative diabetic retinopathy of right eye with macular edema (HCC) Patient reports for the last 2 weeks not using CPAP effectively because of mask leakage this is coincident with significant increase in CSME CME at 8-week interval today post Avastin  Will repeat Avastin today and follow-up again in 5 weeks until patient has some success with proper CPAP device fitting and usage  Moderate nonproliferative diabetic retinopathy of left eye with macular edema associated with type 2 diabetes mellitus (HCC) At 5-week interval OS stable  Retinal telangiectasia of right eye May have some role in worsening of CSME due to poor CPAP usage last 2 to 3 weeks     ICD-10-CM   1. Moderate nonproliferative diabetic retinopathy of right eye with macular  edema associated with type 2 diabetes mellitus (HCC)  E11.3311 OCT, Retina - OU - Both Eyes    Intravitreal Injection, Pharmacologic Agent - OD - Right Eye    bevacizumab (AVASTIN) SOSY 2.5 mg    2. Moderate nonproliferative diabetic retinopathy of left eye with macular edema associated with type 2 diabetes mellitus ([place])  H47.6546     3. Retinal telangiectasia of right eye  H35.071       1.  OD, with significant worsening of center involved CSME today.  This is also coincident with last 3 weeks improper and inability to use CPAP, due to equipment and mask fit issues which she is trying to remedy.  Repeat intravitreal antivegF, Avastin OD today which has done well in the past at 8-week interval but not today.  And follow-up next OD in 5 weeks  2.  OS remained stable some 5 weeks post Eylea, follow-up as scheduled OS  3.  Ophthalmic Meds Ordered this visit:  Meds ordered this encounter  Medications   bevacizumab (AVASTIN) SOSY 2.5 mg       Return in about 5 weeks (around 11/12/2020) for dilate, OD, AVASTIN OCT, and follow-up OS Eylea as scheduled.  There are no Patient Instructions on file for this visit.   Explained the diagnoses, plan, and follow up with the patient and they expressed understanding.  Patient expressed  understanding of the importance of proper follow up care.   Clent Demark  M.D. Diseases & Surgery of the Retina and Vitreous Retina & Diabetic [place] 10/08/20     Abbreviations: M myopia (nearsighted); A astigmatism; H hyperopia (farsighted); P presbyopia; Mrx spectacle prescription;  CTL contact lenses; OD right eye; OS left eye; OU both eyes  XT exotropia; ET esotropia; PEK punctate epithelial keratitis; PEE punctate epithelial erosions; DES dry eye syndrome; MGD meibomian gland dysfunction; ATs artificial tears; PFAT's preservative free artificial tears; [place] nuclear sclerotic cataract; PSC posterior subcapsular cataract; ERM epi-retinal membrane; PVD  posterior vitreous detachment; RD retinal detachment; DM diabetes mellitus; DR diabetic retinopathy; NPDR non-proliferative diabetic retinopathy; PDR proliferative diabetic retinopathy; CSME clinically significant macular edema; DME diabetic macular edema; dbh dot blot hemorrhages; CWS cotton wool spot; POAG primary open angle glaucoma; C/D cup-to-disc ratio; HVF humphrey visual field; GVF goldmann visual field; OCT optical coherence tomography; IOP intraocular pressure; BRVO Branch retinal vein occlusion; CRVO central retinal vein occlusion; CRAO central retinal artery occlusion; BRAO branch retinal artery occlusion; RT retinal tear; SB scleral buckle; PPV pars plana vitrectomy; VH Vitreous hemorrhage; PRP panretinal laser photocoagulation; IVK intravitreal kenalog; VMT vitreomacular traction; MH Macular hole;  NVD neovascularization of the disc; NVE neovascularization elsewhere; AREDS age related eye disease study; ARMD age related macular degeneration; POAG primary open angle glaucoma; EBMD epithelial/anterior basement membrane dystrophy; ACIOL anterior chamber intraocular lens; IOL intraocular lens; PCIOL posterior chamber intraocular lens; Phaco/IOL phacoemulsification with intraocular lens placement; [place] photorefractive keratectomy; LASIK laser assisted in situ keratomileusis; HTN hypertension; DM diabetes mellitus; COPD chronic obstructive pulmonary disease

## 2020-10-08 NOTE — Assessment & Plan Note (Signed)
At 5-week interval OS stable

## 2020-10-08 NOTE — Assessment & Plan Note (Signed)
Patient reports for the last 2 weeks not using CPAP effectively because of mask leakage this is coincident with significant increase in CSME CME at 8-week interval today post Avastin  Will repeat Avastin today and follow-up again in 5 weeks until patient has some success with proper CPAP device fitting and usage

## 2020-10-08 NOTE — Assessment & Plan Note (Signed)
May have some role in worsening of CSME due to poor CPAP usage last 2 to 3 weeks

## 2020-10-15 ENCOUNTER — Other Ambulatory Visit: Payer: Self-pay

## 2020-10-15 ENCOUNTER — Ambulatory Visit (INDEPENDENT_AMBULATORY_CARE_PROVIDER_SITE_OTHER): Payer: HMO | Admitting: Pharmacist

## 2020-10-15 DIAGNOSIS — E785 Hyperlipidemia, unspecified: Secondary | ICD-10-CM | POA: Diagnosis not present

## 2020-10-15 DIAGNOSIS — E1169 Type 2 diabetes mellitus with other specified complication: Secondary | ICD-10-CM

## 2020-10-15 DIAGNOSIS — I10 Essential (primary) hypertension: Secondary | ICD-10-CM

## 2020-10-15 NOTE — Progress Notes (Signed)
Chronic Care Management Pharmacy Note  10/16/2020 Name:  Whitney [name] MRN:  [id number] DOB:  12-30-1934  Summary: -BP elevated at home over last week or 2 (142/64-163/81) -Fasting BG elevated 150s-170s with some readings in low 200s -Pt denies changes in lifestyle/diet, she feels well  Recommendations/Changes made from today's visit: -Scheduled appt with PCP; recommend A1c, BMP in preparation for possible med changes -Consider increasing lisinopril-HCTZ if labwork permits -Consider changing Jardiance to Rybelsus for improved BG lowering w/o increasing costs (both Tier 3) -Advised to get Shingles vaccine at local pharmacy   Subjective: Whitney [name] is an 86 y.o. year old female who is a primary patient of [name], MD.  The CCM team was consulted for assistance with disease management and care coordination needs.    Engaged with patient by telephone for follow up visit in response to provider referral for pharmacy case management and/or care coordination services.   Consent to Services:  The patient was given information about Chronic Care Management services, agreed to services, and gave verbal consent prior to initiation of services.  Please see initial visit note for detailed documentation.   Patient Care Team: [name], MD as PCP - General (Internal Medicine) Noralee Space, MD as Consulting Physician (Pulmonary Disease) Gean Birchwood, DPM as Consulting Physician (Podiatry) Charlton Haws, [facility] as Pharmacist (Pharmacist)  Pt has lived here her whole life, she has 4 children, 4 grandchildren, 3 great-grandchildren. Patient lives alone; she takes care of house work.  Recent office visits: 03/17/20 Dr Sharlet Salina OV: CPE. Labs normal, no changes needed. Ordered MRI brain for memory loss, no acute abnormalities  09/28/19 Dr Sharlet Salina OV: f/u for DM. Fasting BG 188. Stop pioglitazone and start Jardiance 25 mg  Recent consult visits: 10/08/20  Dr Zadie Rhine (ophthalmology): retina f/u (diabetic retinopathy); given intravitreal injection Avastin  05/15/20 NP Clabe Seal (neurology): f/u OSA. CPAP going well.  Hospital visits: None in previous 6 months   Objective:  Lab Results  Component Value Date   CREATININE 0.81 03/17/2020   BUN 11 03/17/2020   GFR 67.28 03/17/2020   GFRNONAA 87.17 02/16/2010   GFRAA 156 02/19/2008   NA 138 03/17/2020   K 3.8 03/17/2020   CALCIUM 10.6 (H) 03/17/2020   CO2 29 03/17/2020   GLUCOSE 272 (H) 03/17/2020    Lab Results  Component Value Date/Time   HGBA1C 7.9 (H) 03/17/2020 09:50 AM   HGBA1C 7.5 (H) 09/04/2019 02:40 PM   GFR 67.28 03/17/2020 09:50 AM   GFR 86.72 09/04/2019 02:40 PM   MICROALBUR 2.0 (H) 05/19/2015 09:21 AM   MICROALBUR 2.5 (H) 05/14/2014 10:01 AM    Last diabetic Eye exam:  Lab Results  Component Value Date/Time   HMDIABEYEEXA Retinopathy (A) 10/18/2018 12:00 AM    Last diabetic Foot exam: No results found for: HMDIABFOOTEX   Lab Results  Component Value Date   CHOL 145 03/17/2020   HDL 40.50 03/17/2020   LDLCALC 80 03/17/2020   TRIG 124.0 03/17/2020   CHOLHDL 4 03/17/2020    Hepatic Function Latest Ref Rng & Units 03/17/2020 09/04/2019 09/29/2018  Total Protein 6.0 - 8.3 g/dL 8.0 8.0 7.4  Albumin 3.5 - 5.2 g/dL 4.1 4.1 3.9  AST 0 - 37 U/L '21 19 19  ' ALT 0 - 35 U/L '18 16 17  ' Alk Phosphatase 39 - 117 U/L 45 54 47  Total Bilirubin 0.2 - 1.2 mg/dL 1.0 1.1 0.7  Bilirubin, Direct 0.0 - 0.3 mg/dL - - -  Lab Results  Component Value Date/Time   TSH 1.13 09/14/2013 09:59 AM   TSH 1.22 08/23/2012 10:19 AM    CBC Latest Ref Rng & Units 03/17/2020 09/04/2019 09/29/2018  WBC 4.0 - 10.5 K/uL 5.4 7.5 6.5  Hemoglobin 12.0 - 15.0 g/dL 12.9 12.5 12.4  Hematocrit 36.0 - 46.0 % 39.7 38.0 37.4  Platelets 150.0 - 400.0 K/uL 222.0 199.0 193.0    Lab Results  Component Value Date/Time   VD25OH 66 08/23/2011 10:26 AM   VD25OH 40 08/18/2009 07:43 PM    Clinical ASCVD: No   The ASCVD Risk score Mikey Bussing DC Jr., et al., 2013) failed to calculate for the following reasons:   The 2013 ASCVD risk score is only valid for ages 73 to 47    Depression screen PHQ 2/9 10/08/2019 09/28/2019 09/19/2018  Decreased Interest 0 0 0  Down, Depressed, Hopeless 0 0 0  PHQ - 2 Score 0 0 0  Altered sleeping - - -  Tired, decreased energy - - -  Change in appetite - - -  Feeling bad or failure about yourself  - - -  Trouble concentrating - - -  Moving slowly or fidgety/restless - - -  Suicidal thoughts - - -  PHQ-9 Score - - -  Difficult doing work/chores - - -     Social History   Tobacco Use  Smoking Status Never  Smokeless Tobacco Never   BP Readings from Last 3 Encounters:  05/15/20 125/71  03/17/20 (!) 168/80  12/14/19 130/70   Pulse Readings from Last 3 Encounters:  05/15/20 83  03/17/20 62  12/14/19 (!) 57   Wt Readings from Last 3 Encounters:  05/15/20 154 lb (69.9 kg)  03/17/20 145 lb (65.8 kg)  12/14/19 156 lb (70.8 kg)   BMI Readings from Last 3 Encounters:  05/15/20 29.10 kg/m  03/17/20 27.40 kg/m  12/14/19 29.48 kg/m    Assessment/Interventions: Review of patient past medical history, allergies, medications, health status, including review of consultants reports, laboratory and other test data, was performed as part of comprehensive evaluation and provision of chronic care management services.   SDOH:  (Social Determinants of Health) assessments and interventions performed: Yes  SDOH Screenings   Alcohol Screen: Not on file  Depression (PHQ2-9): Not on file  Financial Resource Strain: Low Risk    Difficulty of Paying Living Expenses: Not hard at all  Food Insecurity: Not on file  Housing: Not on file  Physical Activity: Not on file  Social Connections: Not on file  Stress: Not on file  Tobacco Use: Low Risk    Smoking Tobacco Use: Never   Smokeless Tobacco Use: Never  Transportation Needs: Not on file    [place]  No Known  Allergies  Medications Reviewed Today     Reviewed by Charlton Haws, [facility] (Pharmacist) on 10/15/20 at 1336  Med List Status: <None>   Medication Order Taking? Sig Documenting Provider Last Dose Status Informant  aspirin 81 MG tablet [id number] Yes Take 1 tablet (81 mg total) by mouth daily. [name], MD Taking Active   blood glucose meter kit and supplies KIT [id number] Yes Use to test blood sugar up to 3 times daily. DX E11.9 [name], MD Taking Active   Calcium Carb-Cholecalciferol (CALCIUM-VITAMIN D) 500-200 MG-UNIT tablet [id number] Yes Take 1 tablet by mouth 2 (two) times daily with a meal. [name], MD Taking Active   diclofenac Sodium (VOLTAREN) 1 % GEL [id number] Yes  Apply 4 g topically 4 (four) times daily. [name], MD Taking Active   dorzolamide-timolol (COSOPT) 22.3-6.8 MG/ML ophthalmic solution [id number] Yes Instill 1 drop in each eye twice a day [name], MD Taking Active   DUREZOL 0.05 % EMUL [id number] Yes  [provider] Taking Active            Med Note Izola Price, AMY R   Mon Mar 17, 2015  9:44 AM) Received from: External Pharmacy  empagliflozin (JARDIANCE) 25 MG TABS tablet [id number] Yes Take 1 tablet (25 mg total) by mouth daily before breakfast. [name], MD Taking Active   glimepiride (AMARYL) 4 MG tablet [id number] Yes TAKE 1 TABLET EVERY DAY BEFORE BREAKFAST [name], MD Taking Active   glucose blood [facility] VERIO) test strip [id number] Yes Use to test blood sugar 3 times a day [name], MD Taking Active   Lancet Devices [facility]) lancets [id number] Yes Use to test blood sugar up to 3 times a day. DX E11.9 [name], MD Taking Active   lisinopril-hydrochlorothiazide (ZESTORETIC) 20-12.5 MG tablet [id number] Yes Take 1 tablet by mouth daily. [name], MD Taking Active   metFORMIN (GLUCOPHAGE) 1000 MG tablet [id number] Yes Take 1  tablet (1,000 mg total) by mouth 2 (two) times daily with a meal. [name], MD Taking Active   Multiple Vitamin (MULTI VITAMIN DAILY PO) [id number] Yes Take one by mouth daily [provider] Taking Active            Med Note Wynona Neat, [place] Jun 19, 2013  9:03 AM) Received from: External Pharmacy  simvastatin (ZOCOR) 20 MG tablet [id number] Yes Take 1 tablet (20 mg total) by mouth at bedtime. [name], MD Taking Active             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 associated with type 2 diabetes mellitus ([place]) 08/13/2019   Moderate nonproliferative diabetic retinopathy of right eye with macular edema ([facility]) 08/13/2019   Type 2 macular telangiectasis, left 08/13/2019   Retinal telangiectasia of right eye 08/13/2019   OSA on CPAP 07/20/2017   Venous insufficiency 04/26/2017   Sensorineural hearing loss (SNHL), bilateral 12/08/2016   Routine general medical examination at a health care facility 05/25/2016   Type 2 diabetes mellitus with hyperlipidemia ([place]) 02/23/2012   Obesity 08/23/2011   GLAUCOMA 08/24/2009   Left shoulder pain 02/19/2008   Hyperlipidemia associated with type 2 diabetes mellitus ([place]) 12/22/2006   Essential hypertension 12/22/2006   GERD 12/22/2006   [place] DISEASE, CERVICAL 12/22/2006   Osteopenia 12/22/2006   Other chest pain 12/22/2006    Immunization History  Administered Date(s) Administered   Fluad Quad(high Dose 65+) 03/30/2019, 03/17/2020   Influenza Split 02/18/2011, 02/23/2012   Influenza Whole 01/19/2005, 02/17/2009, 02/16/2010   Influenza, High Dose Seasonal PF 02/14/2015, 04/21/2017, 02/02/2018   Influenza,inj,Quad PF,6+ Mos 03/12/2013, 02/07/2014   PFIZER(Purple Top)SARS-COV-2 Vaccination 05/04/2019, 05/28/2019, 01/29/2020   Pneumococcal Conjugate-13 05/25/2016   Pneumococcal  Polysaccharide-23 04/12/1998, 02/16/2010   Td 08/21/2002   Tdap 08/23/2011    Conditions to be addressed/monitored:  Hypertension, Hyperlipidemia, and Diabetes  Care Plan : [place]  Updates made by Charlton Haws, [place] since 10/16/2020 12:00 AM     Problem: Hypertension, Hyperlipidemia, and Diabetes   Priority: High     Long-Range  Goal: Disease management   Start Date: 10/16/2020  Expected End Date: 04/18/2021  This Visit's Progress: On track  Priority: High  Note:   Current Barriers:  Unable to independently monitor therapeutic efficacy Unable to maintain control of BP, DM  Pharmacist Clinical Goal(s):  Patient will achieve adherence to monitoring guidelines and medication adherence to achieve therapeutic efficacy adhere to plan to optimize therapeutic regimen for BP, DM as evidenced by report of adherence to recommended medication management changes through collaboration with PharmD and provider.   Interventions: 1:1 collaboration with [name], MD regarding development and update of comprehensive plan of care as evidenced by provider attestation and co-signature Inter-disciplinary care team collaboration (see longitudinal plan of care) Comprehensive medication review performed; medication list updated in electronic medical record  Hypertension    BP goal is:  <140/90 Patient checks BP at home daily Patient home BP readings are ranging: 154/71, 163/81, 147/62, 143/75, 144/71, 142/64   Patient has failed these meds in the past: n/a Patient is currently controlled on the following medications: Lisinopril-HCTZ 20-12.5 mg daily AM   We discussed: pt-reported home BP is above goal; pt denies significant stressors, changes in diet or lifestyle; she endorses compliance with medication; she exercises every morning (aerobics videos on TV); considered increasing lisinopril/hctz, but it has been over 6 months since she has had labwork so scheduled PCP  f/u before making changes   Plan: Continue current medications for now Scheduled PCP f/u 10/22/20 Consider increasing lisinopril-hctz to 2 tablets daily if BMP is stable   Hyperlipidemia    LDL goal < 100 Patient has failed these meds in past: n/a Patient is currently controlled on the following medications: Simvastatin 20 mg HS Aspirin 81 mg daily   We discussed:  pt reports compliance with medications as above; LDL is at goal; pt endorses daily exercise and low cholesterol diet   Plan: Continue current medications   Diabetes    A1c goal <8%  Checking BG: Daily Recent FBG Readings: 7/6 221, 212 after exercise (dinner yesterday - black beans, corn, fruit cup) 7/5 133 7/4 200 7/2 174 June - 161, 193, 176, 165, 152, 157, 167, 190, 157, 148   Patient has failed these meds in past: pioglitazone Patient is currently controlled on the following medications: Metformin 1000 mg BID Glimepiride 4 mg daily AM (started ~2012) Jardiance 25 mg daily AM Testing supplies   We discussed: fasting BG is above goal and into 200s on occasion; pt endorses compliance with medications, denies significant changes in diet; advised to reduce carbs/sugar in diet;  -Pt would benefit from a GLP-1 for improved BG lowering and wt loss; considered adding Rybelsus vs changing Jardiance to Rybelsus - pt likely cannot afford two Tier 3 drugs at a time; it has been over 6 months since A1c check/other labwork so deferred med changes    Plan: Continue current medications  PCP visit scheduled 7/13 Consider changing Jardiance to Rybelsus  Patient Goals/Self-Care Activities Patient will:  - take medications as prescribed focus on medication adherence by pill box check glucose daily, document, and provide at future appointments check blood pressure daily, document, and provide at future appointments target a minimum of 150 minutes of moderate intensity exercise weekly engage in dietary modifications by  reducing carb/sugar -Keep PCP f/u appt 7/13      Medication Assistance: None required.  Patient affirms current coverage meets needs.  Compliance/Adherence/Medication fill history: Care Gaps: Shingrix Covid booster (due 05/31/20)  Star-Rating Drugs: Jardiance  -  LF 08/11/20 x 90 ds Lisinopril-HCTZ - LF 08/11/20 x 90 ds Metformin - LF 08/28/20 x 90 ds Simvastatin - LF 08/11/20 x 90 ds  Patient's preferred pharmacy is:  [facility] [address] [place] [address] Phone: [phone] Fax: [phone]  [facility] [place], [place] [address] Phone: [phone] Fax: [phone]  Uses pill box? Yes Pt endorses 100% compliance  We discussed: Current pharmacy is preferred with insurance plan and patient is satisfied with pharmacy services Patient decided to: Continue current medication management strategy  Care Plan and Follow Up Patient Decision:  Patient agrees to Care Plan and Follow-up.  Plan: Telephone follow up appointment with care management team member scheduled for:  1 months  Charlene Brooke, PharmD, [place], CPP Clinical Pharmacist [facility] at [facility] [phone]

## 2020-10-16 NOTE — Patient Instructions (Signed)
Visit Information  Phone number for Pharmacist: [phone]   Goals Addressed             This Visit's Progress    Monitor and Manage My Blood Sugar-Diabetes Type 2       Timeframe:  Long-Range Goal Priority:  High Start Date:     10/15/20                        Expected End Date:   03/17/21                    Follow Up Date Aug 2022   - check blood sugar at prescribed times - check blood sugar if I feel it is too high or too low - enter blood sugar readings and medication or insulin into daily log - take the blood sugar log to all doctor visits    Why is this important?   Checking your blood sugar at home helps to keep it from getting very high or very low.  Writing the results in a diary or log helps the doctor know how to care for you.  Your blood sugar log should have the time, date and the results.  Also, write down the amount of insulin or other medicine that you take.  Other information, like what you ate, exercise done and how you were feeling, will also be helpful.     Notes:       Track and Manage My Blood Pressure-Hypertension       Timeframe:  Long-Range Goal Priority:  High Start Date:    10/15/20                         Expected End Date:   03/17/21                    Follow Up Date Aug 2022   - check blood pressure daily - write blood pressure results in a log or diary -Reduce salt in diet -Take blood pressure medication (lisinopril-Hctz) every day    Why is this important?   You won't feel high blood pressure, but it can still hurt your blood vessels.  High blood pressure can cause heart or kidney problems. It can also cause a stroke.  Making lifestyle changes like losing a little weight or eating less salt will help.  Checking your blood pressure at home and at different times of the day can help to control blood pressure.  If the doctor prescribes medicine remember to take it the way the doctor ordered.  Call the office if you cannot afford the  medicine or if there are questions about it.     Notes:          Patient verbalizes understanding of instructions provided today and agrees to view in [place].  Telephone follow up appointment with pharmacy team member scheduled for: 1 month  Charlene Brooke, PharmD, [place], CPP Clinical Pharmacist [facility] at [facility] [phone]

## 2020-10-22 ENCOUNTER — Ambulatory Visit (INDEPENDENT_AMBULATORY_CARE_PROVIDER_SITE_OTHER): Payer: HMO | Admitting: Internal Medicine

## 2020-10-22 ENCOUNTER — Other Ambulatory Visit: Payer: Self-pay

## 2020-10-22 ENCOUNTER — Encounter: Payer: Self-pay | Admitting: Internal Medicine

## 2020-10-22 VITALS — BP 130/78 | HR 71 | Temp 98.5°F | Resp 18 | Ht 61.0 in | Wt 148.8 lb

## 2020-10-22 DIAGNOSIS — E785 Hyperlipidemia, unspecified: Secondary | ICD-10-CM | POA: Diagnosis not present

## 2020-10-22 DIAGNOSIS — E1169 Type 2 diabetes mellitus with other specified complication: Secondary | ICD-10-CM | POA: Diagnosis not present

## 2020-10-22 LAB — POCT GLYCOSYLATED HEMOGLOBIN (HGB A1C): Hemoglobin A1C: 7.7 % — AB (ref 4.0–5.6)

## 2020-10-22 NOTE — Patient Instructions (Addendum)
Your HgA1c is 7.7 and we will get you in with the nutritionist to check in with the diet.   Let us know if you are still having problems with the CPAP.

## 2020-10-22 NOTE — Progress Notes (Signed)
   Subjective:   Patient ID: Whitney [name], female    DOB: 09-29-1934, 86 y.o.   MRN: [id number]  HPI The patient is an 86 YO female coming in for follow up medical conditions including diabetes.  Review of Systems  Constitutional:  Positive for fatigue.  HENT: Negative.    Eyes:  Positive for visual disturbance.  Respiratory:  Negative for cough, chest tightness and shortness of breath.   Cardiovascular:  Negative for chest pain, palpitations and leg swelling.  Gastrointestinal:  Negative for abdominal distention, abdominal pain, constipation, diarrhea, nausea and vomiting.  Musculoskeletal: Negative.   Skin: Negative.   Neurological: Negative.   Psychiatric/Behavioral: Negative.     Objective:  Physical Exam Constitutional:      Appearance: She is well-developed.  HENT:     Head: Normocephalic and atraumatic.  Cardiovascular:     Rate and Rhythm: Normal rate and regular rhythm.  Pulmonary:     Effort: Pulmonary effort is normal. No respiratory distress.     Breath sounds: Normal breath sounds. No wheezing or rales.  Abdominal:     General: Bowel sounds are normal. There is no distension.     Palpations: Abdomen is soft.     Tenderness: There is no abdominal tenderness. There is no rebound.  Musculoskeletal:     Cervical back: Normal range of motion.  Skin:    General: Skin is warm and dry.     Comments: Foot exam done  Neurological:     Mental Status: She is alert and oriented to person, place, and time.     Coordination: Coordination normal.    Vitals:   10/22/20 1025  BP: 130/78  Pulse: 71  Resp: 18  Temp: 98.5 F (36.9 C)  TempSrc: Oral  SpO2: 97%  Weight: 148 lb 12.8 oz (67.5 kg)  Height: 5\' 1"  (1.549 m)    This visit occurred during the SARS-CoV-2 public health emergency.  Safety protocols were in place, including screening questions prior to the visit, additional usage of staff PPE, and extensive cleaning of exam room while observing appropriate  contact time as indicated for disinfecting solutions.   Assessment & Plan:

## 2020-10-23 ENCOUNTER — Encounter: Payer: Self-pay | Admitting: Internal Medicine

## 2020-10-23 NOTE — Assessment & Plan Note (Signed)
POC HgA1c checked today at 7.7 which is higher than the patient would like but at goal. She does not want to start new medications but is willing to make dietary changes. Referral to nutrition. Continue jardiance and amaryl and metformin. Foot exam done. Eye exam up to date. Taking ACE-I and statin.

## 2020-11-03 ENCOUNTER — Ambulatory Visit (INDEPENDENT_AMBULATORY_CARE_PROVIDER_SITE_OTHER): Payer: HMO | Admitting: Ophthalmology

## 2020-11-03 ENCOUNTER — Encounter (INDEPENDENT_AMBULATORY_CARE_PROVIDER_SITE_OTHER): Payer: Self-pay | Admitting: Ophthalmology

## 2020-11-03 ENCOUNTER — Other Ambulatory Visit: Payer: Self-pay

## 2020-11-03 DIAGNOSIS — E113312 Type 2 diabetes mellitus with moderate nonproliferative diabetic retinopathy with macular edema, left eye: Secondary | ICD-10-CM | POA: Diagnosis not present

## 2020-11-03 DIAGNOSIS — E113311 Type 2 diabetes mellitus with moderate nonproliferative diabetic retinopathy with macular edema, right eye: Secondary | ICD-10-CM | POA: Diagnosis not present

## 2020-11-03 DIAGNOSIS — H35071 Retinal telangiectasis, right eye: Secondary | ICD-10-CM

## 2020-11-03 MED ORDER — AFLIBERCEPT 2MG/0.05ML IZ SOLN FOR KALEIDOSCOPE
2.0000 mg | INTRAVITREAL | Status: AC | PRN
  Administered 2020-11-03: 2 mg via INTRAVITREAL

## 2020-11-03 NOTE — Assessment & Plan Note (Signed)
Improved mask fit and compliance used over the last 2 weeks.

## 2020-11-03 NOTE — Progress Notes (Signed)
11/03/2020     CHIEF COMPLAINT Patient presents for Retina Follow Up (9 weeks fu os and Eylea OS/Pt states, " I do have some new floaters in my OS."/A1C: 7.7/LBS: 154/Pt reports using Cosopt BID OU/)   HISTORY OF PRESENT ILLNESS: Whitney [name] is a 86 y.o. female who presents to the clinic today for:   HPI     Retina Follow Up           Diagnosis: Diabetic Retinopathy   Laterality: left eye   Onset: 9 weeks ago   Severity: mild   Duration: 9 weeks   Course: stable   Comments: 9 weeks fu os and Eylea OS Pt states, " I do have some new floaters in my OS." A1C: 7.7 LBS: 154 Pt reports using Cosopt BID OU        Last edited by Kendra Opitz, COA on 11/03/2020  9:43 AM.      Referring physician: [name], MD [place],  [place] [address]  HISTORICAL INFORMATION:   Selected notes from the MEDICAL RECORD NUMBER    Lab Results  Component Value Date   HGBA1C 7.7 (A) 10/22/2020     CURRENT MEDICATIONS: Current Outpatient Medications (Ophthalmic Drugs)  Medication Sig   dorzolamide-timolol (COSOPT) 22.3-6.8 MG/ML ophthalmic solution Instill 1 drop in each eye twice a day   DUREZOL 0.05 % EMUL    No current facility-administered medications for this visit. (Ophthalmic Drugs)   Current Outpatient Medications (Other)  Medication Sig   aspirin 81 MG tablet Take 1 tablet (81 mg total) by mouth daily.   blood glucose meter kit and supplies KIT Use to test blood sugar up to 3 times daily. DX E11.9   Calcium Carb-Cholecalciferol (CALCIUM-VITAMIN D) 500-200 MG-UNIT tablet Take 1 tablet by mouth 2 (two) times daily with a meal.   diclofenac Sodium (VOLTAREN) 1 % GEL Apply 4 g topically 4 (four) times daily.   empagliflozin (JARDIANCE) 25 MG TABS tablet Take 1 tablet (25 mg total) by mouth daily before breakfast.   glimepiride (AMARYL) 4 MG tablet TAKE 1 TABLET EVERY DAY BEFORE BREAKFAST   glucose blood (ONETOUCH VERIO) test strip Use to test  blood sugar 3 times a day   Lancet Devices (ACCU-CHEK SOFTCLIX) lancets Use to test blood sugar up to 3 times a day. DX E11.9   lisinopril-hydrochlorothiazide (ZESTORETIC) 20-12.5 MG tablet Take 1 tablet by mouth daily.   metFORMIN (GLUCOPHAGE) 1000 MG tablet Take 1 tablet (1,000 mg total) by mouth 2 (two) times daily with a meal.   Multiple Vitamin (MULTI VITAMIN DAILY PO) Take one by mouth daily   simvastatin (ZOCOR) 20 MG tablet Take 1 tablet (20 mg total) by mouth at bedtime.   No current facility-administered medications for this visit. (Other)      REVIEW OF SYSTEMS:    ALLERGIES No Known Allergies  PAST MEDICAL HISTORY Past Medical History:  Diagnosis Date   Anxiety    Baker's cyst    Chest pain    Diabetes mellitus type 2, uncontrolled ([place])    Diabetes mellitus without complication (HCC)    Disc disease, degenerative, cervical    Diverticulosis of colon    DJD (degenerative joint disease) of knee    Facial pain, atypical    GERD (gastroesophageal reflux disease)    Glaucoma    Hypercholesteremia    Hypertension    Lumbar spondylosis    Obesity    Onychomycosis  Osteopenia    Past Surgical History:  Procedure Laterality Date   right eye surgery  09/2010   retina in right eye   VAGINAL HYSTERECTOMY  2002   Dr. Kem Kays repair    FAMILY HISTORY Family History  Problem Relation Age of Onset   Diabetes Mother    Colon cancer Neg Hx    Stomach cancer Neg Hx     SOCIAL HISTORY Social History   Tobacco Use   Smoking status: Never   Smokeless tobacco: Never  Vaping Use   Vaping Use: Never used  Substance Use Topics   Alcohol use: No   Drug use: No         OPHTHALMIC EXAM:  Base Eye Exam     Visual Acuity (ETDRS)       Right Left   Dist cc 20/40 +1 20/25 -1   Dist ph cc NI NI    Correction: Glasses         Tonometry (Tonopen, 9:47 AM)       Right Left   Pressure 11 11         Pupils       Pupils Dark Light Shape React  APD   Right PERRL 3 3 Irregular Minimal None   Left PERRL 3 3 Irregular Minimal None         Visual Fields (Counting fingers)       Left Right    Full Full         Extraocular Movement       Right Left    Full Full         Neuro/Psych     Oriented x3: Yes   Mood/Affect: Normal         Dilation     Left eye: 1.0% Mydriacyl, 2.5% Phenylephrine @ 9:47 AM           Slit Lamp and Fundus Exam     External Exam       Right Left   External Normal Normal         Slit Lamp Exam       Right Left   Lids/Lashes Normal Normal   Conjunctiva/Sclera White and quiet White and quiet   Cornea Clear Clear   Anterior Chamber Deep and quiet Deep and quiet   Iris Round and reactive Round and reactive   Lens Posterior chamber intraocular lens Posterior chamber intraocular lens   Anterior Vitreous Normal Normal         Fundus Exam       Right Left   Posterior Vitreous  Normal   Disc  Normal   C/D Ratio  0.7   Macula  Microaneurysms, less Macular thickening, Cystoid macular edema, Mild clinically significant macular edema   Vessels  NPDR, moderate   Periphery  Normal            IMAGING AND PROCEDURES  Imaging and Procedures for 11/03/20  OCT, Retina - OU - Both Eyes       Right Eye Quality was good. Scan locations included subfoveal. Central Foveal Thickness: 303. Progression has been stable. Findings include abnormal foveal contour, cystoid macular edema.   Left Eye Quality was good. Scan locations included subfoveal. Central Foveal Thickness: 271. Progression has been stable. Findings include abnormal foveal contour.   Notes OS, area of CSME temporal to the fovea has vastly improved as compared to  9 weeks prior at the time of last in injection of Eylea OS.  OS for planned Eylea injection today, and extend interval to  12 weeks  OD at 4  weeks post recent injection Avastin also with improved macular edema.  Previously improved, at similar interval  now worse coincident with  weeks unsuccessful use of CPAP due to equipment failure, now with improved equipment usage for the last 2 weeks.  RV next week as scheduled             ASSESSMENT/PLAN:  Moderate nonproliferative diabetic retinopathy of left eye with macular edema associated with type 2 diabetes mellitus (HCC) Cavitary type CME, CSME temporally, this could be residual old damage from previous Untreated MAC-TEL superimposed upon diabetic retinopathy with residual large cavitary CME..  We will retreat today and extend interval examination next to 12 weeks and simply monitor with the option of observation next OS  Now with proper fit of mask  Moderate nonproliferative diabetic retinopathy of right eye with macular edema (HCC) OD some 4 weeks post most recent Avastin.  Now with a better properly fitting mask OD, With also less CSME attributable to recent Avastin but also likely improve mask fitting for the last 2 weeks.  We will monitor closely upon next scheduled visit OD     Retinal telangiectasia of right eye Improved mask fit and compliance used over the last 2 weeks.     ICD-10-CM   1. Moderate nonproliferative diabetic retinopathy of left eye with macular edema associated with type 2 diabetes mellitus (HCC)  N23.5573 OCT, Retina - OU - Both Eyes    2. Moderate nonproliferative diabetic retinopathy of right eye with macular edema associated with type 2 diabetes mellitus ([place])  E11.3311     3. Retinal telangiectasia of right eye  H35.071       1.  OS much less CSME, yet with residual CME temporally.  At 9-week interval post Eylea.  Recent last in 2 weeks improve mask fit and compliance with CPAP.  Repeat intravitreal Eylea today extend interval examination next to 12 weeks.  2.  CSME OD vastly improved 4 weeks post injection Avastin, also with 2 weeks of enhance and improved CPAP mask use, follow-up next week as scheduled  3.  Ophthalmic Meds Ordered this visit:  No  orders of the defined types were placed in this encounter.      Return in about 12 weeks (around 01/26/2021) for DILATE OU, OCT, no planned Eylea.  There are no Patient Instructions on file for this visit.   Explained the diagnoses, plan, and follow up with the patient and they expressed understanding.  Patient expressed understanding of the importance of proper follow up care.   Clent Demark Murriel Holwerda M.D. Diseases & Surgery of the Retina and Vitreous Retina & Diabetic [place] 11/03/20     Abbreviations: M myopia (nearsighted); A astigmatism; H hyperopia (farsighted); P presbyopia; Mrx spectacle prescription;  CTL contact lenses; OD right eye; OS left eye; OU both eyes  XT exotropia; ET esotropia; PEK punctate epithelial keratitis; PEE punctate epithelial erosions; DES dry eye syndrome; MGD meibomian gland dysfunction; ATs artificial tears; PFAT's preservative free artificial tears; [place] nuclear sclerotic cataract; PSC posterior subcapsular cataract; ERM epi-retinal membrane; PVD posterior vitreous detachment; RD retinal detachment; DM diabetes mellitus; DR diabetic retinopathy; NPDR non-proliferative diabetic retinopathy; PDR proliferative diabetic retinopathy; CSME clinically significant macular edema; DME diabetic macular edema; dbh dot blot hemorrhages; CWS cotton wool spot; POAG primary open angle glaucoma; C/D cup-to-disc ratio; HVF humphrey visual field; GVF goldmann visual field; OCT optical coherence  tomography; IOP intraocular pressure; BRVO Branch retinal vein occlusion; CRVO central retinal vein occlusion; CRAO central retinal artery occlusion; BRAO branch retinal artery occlusion; RT retinal tear; SB scleral buckle; PPV pars plana vitrectomy; VH Vitreous hemorrhage; PRP panretinal laser photocoagulation; IVK intravitreal kenalog; VMT vitreomacular traction; MH Macular hole;  NVD neovascularization of the disc; NVE neovascularization elsewhere; AREDS age related eye disease study; ARMD  age related macular degeneration; POAG primary open angle glaucoma; EBMD epithelial/anterior basement membrane dystrophy; ACIOL anterior chamber intraocular lens; IOL intraocular lens; PCIOL posterior chamber intraocular lens; Phaco/IOL phacoemulsification with intraocular lens placement; [place] photorefractive keratectomy; LASIK laser assisted in situ keratomileusis; HTN hypertension; DM diabetes mellitus; COPD chronic obstructive pulmonary disease

## 2020-11-03 NOTE — Assessment & Plan Note (Addendum)
Cavitary type CME, CSME temporally, this could be residual old damage from previous Untreated MAC-TEL superimposed upon diabetic retinopathy with residual large cavitary CME..  We will retreat today and extend interval examination next to 12 weeks and simply monitor with the option of observation next OS  Now with proper fit of mask

## 2020-11-03 NOTE — Assessment & Plan Note (Addendum)
OD some 4 weeks post most recent Avastin.  Now with a better properly fitting mask OD, With also less CSME attributable to recent Avastin but also likely improve mask fitting for the last 2 weeks.  We will monitor closely upon next scheduled visit OD

## 2020-11-11 ENCOUNTER — Ambulatory Visit (INDEPENDENT_AMBULATORY_CARE_PROVIDER_SITE_OTHER): Payer: HMO | Admitting: Ophthalmology

## 2020-11-11 ENCOUNTER — Other Ambulatory Visit: Payer: Self-pay

## 2020-11-11 ENCOUNTER — Encounter (INDEPENDENT_AMBULATORY_CARE_PROVIDER_SITE_OTHER): Payer: Self-pay | Admitting: Ophthalmology

## 2020-11-11 DIAGNOSIS — H35071 Retinal telangiectasis, right eye: Secondary | ICD-10-CM | POA: Diagnosis not present

## 2020-11-11 DIAGNOSIS — E113311 Type 2 diabetes mellitus with moderate nonproliferative diabetic retinopathy with macular edema, right eye: Secondary | ICD-10-CM | POA: Diagnosis not present

## 2020-11-11 MED ORDER — BEVACIZUMAB 2.5 MG/0.1ML IZ SOSY
2.5000 mg | PREFILLED_SYRINGE | INTRAVITREAL | Status: AC | PRN
  Administered 2020-11-11: 2.5 mg via INTRAVITREAL

## 2020-11-11 NOTE — Assessment & Plan Note (Signed)
Vastly improved macular edema temporal aspect of the fovea at 5 weeks postinjection.  Some role of prior superimposed MAC-TEL present yet diabetic eye disease responsive to injection intravitreal Avastin we will repeat injection today and maintain current interval evaluation 5 to 6 weeks.

## 2020-11-11 NOTE — Progress Notes (Signed)
11/11/2020     CHIEF COMPLAINT Patient presents for Retina Follow Up (5 week fu od oct avastin OD/Patient states vision is stable and unchanged since last visit. Denies any new floaters or FOL./A1C 7.7, BS not checked this morning./)   HISTORY OF PRESENT ILLNESS: Whitney [name] is a 86 y.o. female who presents to the clinic today for:   HPI     Retina Follow Up           Diagnosis: Diabetic Retinopathy   Laterality: right eye   Onset: 5 weeks ago   Severity: mild   Duration: 5 weeks   Course: stable   Comments: 5 week fu od oct avastin OD Patient states vision is stable and unchanged since last visit. Denies any new floaters or FOL. A1C 7.7, BS not checked this morning.        Last edited by Laurin Coder, COA on 11/11/2020  9:10 AM.      Referring physician: [name], MD [place],  [place] [address]  HISTORICAL INFORMATION:   Selected notes from the MEDICAL RECORD NUMBER    Lab Results  Component Value Date   HGBA1C 7.7 (A) 10/22/2020     CURRENT MEDICATIONS: Current Outpatient Medications (Ophthalmic Drugs)  Medication Sig   dorzolamide-timolol (COSOPT) 22.3-6.8 MG/ML ophthalmic solution Instill 1 drop in each eye twice a day   DUREZOL 0.05 % EMUL    No current facility-administered medications for this visit. (Ophthalmic Drugs)   Current Outpatient Medications (Other)  Medication Sig   aspirin 81 MG tablet Take 1 tablet (81 mg total) by mouth daily.   blood glucose meter kit and supplies KIT Use to test blood sugar up to 3 times daily. DX E11.9   Calcium Carb-Cholecalciferol (CALCIUM-VITAMIN D) 500-200 MG-UNIT tablet Take 1 tablet by mouth 2 (two) times daily with a meal.   diclofenac Sodium (VOLTAREN) 1 % GEL Apply 4 g topically 4 (four) times daily.   empagliflozin (JARDIANCE) 25 MG TABS tablet Take 1 tablet (25 mg total) by mouth daily before breakfast.   glimepiride (AMARYL) 4 MG tablet TAKE 1 TABLET EVERY DAY BEFORE  BREAKFAST   glucose blood (ONETOUCH VERIO) test strip Use to test blood sugar 3 times a day   Lancet Devices (ACCU-CHEK SOFTCLIX) lancets Use to test blood sugar up to 3 times a day. DX E11.9   lisinopril-hydrochlorothiazide (ZESTORETIC) 20-12.5 MG tablet Take 1 tablet by mouth daily.   metFORMIN (GLUCOPHAGE) 1000 MG tablet Take 1 tablet (1,000 mg total) by mouth 2 (two) times daily with a meal.   Multiple Vitamin (MULTI VITAMIN DAILY PO) Take one by mouth daily   simvastatin (ZOCOR) 20 MG tablet Take 1 tablet (20 mg total) by mouth at bedtime.   No current facility-administered medications for this visit. (Other)      REVIEW OF SYSTEMS:    ALLERGIES No Known Allergies  PAST MEDICAL HISTORY Past Medical History:  Diagnosis Date   Anxiety    Baker's cyst    Chest pain    Diabetes mellitus type 2, uncontrolled ([place])    Diabetes mellitus without complication (HCC)    Disc disease, degenerative, cervical    Diverticulosis of colon    DJD (degenerative joint disease) of knee    Facial pain, atypical    GERD (gastroesophageal reflux disease)    Glaucoma    Hypercholesteremia    Hypertension    Lumbar spondylosis    Obesity  Onychomycosis    Osteopenia    Past Surgical History:  Procedure Laterality Date   right eye surgery  09/2010   retina in right eye   VAGINAL HYSTERECTOMY  2002   Dr. Kem Kays repair    FAMILY HISTORY Family History  Problem Relation Age of Onset   Diabetes Mother    Colon cancer Neg Hx    Stomach cancer Neg Hx     SOCIAL HISTORY Social History   Tobacco Use   Smoking status: Never   Smokeless tobacco: Never  Vaping Use   Vaping Use: Never used  Substance Use Topics   Alcohol use: No   Drug use: No         OPHTHALMIC EXAM:  Base Eye Exam     Visual Acuity (ETDRS)       Right Left   Dist cc 20/25 -1 20/25 -2         Tonometry (Tonopen, 9:13 AM)       Right Left   Pressure 10 10         Pupils       Dark  Light Shape React APD   Right 3 3 Irregular Brisk None   Left 3 3 Irregular Brisk None         Visual Fields (Counting fingers)       Left Right    Full Full         Extraocular Movement       Right Left    Full Full         Neuro/Psych     Oriented x3: Yes   Mood/Affect: Normal         Dilation     Right eye: 1.0% Mydriacyl, 2.5% Phenylephrine @ 9:13 AM           Slit Lamp and Fundus Exam     External Exam       Right Left   External Normal Normal         Slit Lamp Exam       Right Left   Lids/Lashes Normal Normal   Conjunctiva/Sclera White and quiet White and quiet   Cornea Clear Clear   Anterior Chamber Deep and quiet Deep and quiet   Iris Round and reactive Round and reactive   Lens Posterior chamber intraocular lens Posterior chamber intraocular lens   Anterior Vitreous Normal Normal         Fundus Exam       Right Left   Posterior Vitreous Normal    Disc Normal    C/D Ratio 0.55    Macula Microaneurysms,, trace clinical CSME    Vessels NPDR, moderate    Periphery Normal             IMAGING AND PROCEDURES  Imaging and Procedures for 11/11/20  OCT, Retina - OU - Both Eyes       Right Eye Quality was good. Scan locations included subfoveal. Central Foveal Thickness: 284. Progression has been stable. Findings include abnormal foveal contour, cystoid macular edema.   Left Eye Quality was good. Scan locations included subfoveal. Central Foveal Thickness: 271. Progression has been stable. Findings include abnormal foveal contour.   Notes OS, area of CSME temporal to the fovea has vastly improved as compared to 1 week previous OD at 5 weeks post recent injection Avastin also with improved macular edema.  Vastly improved from 447 m now to 284 5 weeks after most recent injection OD  Intravitreal Injection, Pharmacologic Agent - OD - Right Eye       Time Out 11/11/2020. 9:51 AM. Confirmed correct patient, procedure,  site, and patient consented.   Anesthesia Topical anesthesia was used. Anesthetic medications included [place] 3.5%.   Procedure Preparation included 10% betadine to eyelids, 5% betadine to ocular surface, Ofloxacin . A 30 gauge needle was used.   Injection: 2.5 mg bevacizumab 2.5 MG/0.1ML   Route: Intravitreal, Site: Right Eye   NDC: [phone], Lot: [id number]   Post-op Post injection exam found visual acuity of at least counting fingers. The patient tolerated the procedure well. There were no complications. The patient received written and verbal post procedure care education. Post injection medications were not given.              ASSESSMENT/PLAN:  Moderate nonproliferative diabetic retinopathy of right eye with macular edema (HCC) Vastly improved macular edema temporal aspect of the fovea at 5 weeks postinjection.  Some role of prior superimposed MAC-TEL present yet diabetic eye disease responsive to injection intravitreal Avastin we will repeat injection today and maintain current interval evaluation 5 to 6 weeks.  Retinal telangiectasia of right eye Patient reports improved compliance now as of the last month     ICD-10-CM   1. Moderate nonproliferative diabetic retinopathy of right eye with macular edema associated with type 2 diabetes mellitus (HCC)  E11.3311 OCT, Retina - OU - Both Eyes    Intravitreal Injection, Pharmacologic Agent - OD - Right Eye    bevacizumab (AVASTIN) SOSY 2.5 mg    2. Retinal telangiectasia of right eye  H35.071       1.  OD, June 2022 had rather striking worsening of CME temporal aspect of the fovea possibly due to superimposed macular ischemic insult from incompletely treated sleep apnea for the 2 to 3 weeks prior to that time.  Improvement in mask fitting compliance has occurred since early July now coincident with improved macular findings post intravitreal Avastin.  Repeat Avastin OD today and examination OD next in 6 weeks  2.   Follow-up OS as scheduled  3.  Ophthalmic Meds Ordered this visit:  Meds ordered this encounter  Medications   bevacizumab (AVASTIN) SOSY 2.5 mg       Return in about 6 weeks (around 12/23/2020) for dilate, OD, AVASTIN OCT.  There are no Patient Instructions on file for this visit.   Explained the diagnoses, plan, and follow up with the patient and they expressed understanding.  Patient expressed understanding of the importance of proper follow up care.   Clent Demark Kensleigh Gates M.D. Diseases & Surgery of the Retina and Vitreous Retina & Diabetic [place] 11/11/20     Abbreviations: M myopia (nearsighted); A astigmatism; H hyperopia (farsighted); P presbyopia; Mrx spectacle prescription;  CTL contact lenses; OD right eye; OS left eye; OU both eyes  XT exotropia; ET esotropia; PEK punctate epithelial keratitis; PEE punctate epithelial erosions; DES dry eye syndrome; MGD meibomian gland dysfunction; ATs artificial tears; PFAT's preservative free artificial tears; [place] nuclear sclerotic cataract; PSC posterior subcapsular cataract; ERM epi-retinal membrane; PVD posterior vitreous detachment; RD retinal detachment; DM diabetes mellitus; DR diabetic retinopathy; NPDR non-proliferative diabetic retinopathy; PDR proliferative diabetic retinopathy; CSME clinically significant macular edema; DME diabetic macular edema; dbh dot blot hemorrhages; CWS cotton wool spot; POAG primary open angle glaucoma; C/D cup-to-disc ratio; HVF humphrey visual field; GVF goldmann visual field; OCT optical coherence tomography; IOP intraocular pressure; BRVO Branch retinal vein occlusion; CRVO central retinal vein occlusion;  CRAO central retinal artery occlusion; BRAO branch retinal artery occlusion; RT retinal tear; SB scleral buckle; PPV pars plana vitrectomy; VH Vitreous hemorrhage; PRP panretinal laser photocoagulation; IVK intravitreal kenalog; VMT vitreomacular traction; MH Macular hole;  NVD neovascularization of the  disc; NVE neovascularization elsewhere; AREDS age related eye disease study; ARMD age related macular degeneration; POAG primary open angle glaucoma; EBMD epithelial/anterior basement membrane dystrophy; ACIOL anterior chamber intraocular lens; IOL intraocular lens; PCIOL posterior chamber intraocular lens; Phaco/IOL phacoemulsification with intraocular lens placement; [place] photorefractive keratectomy; LASIK laser assisted in situ keratomileusis; HTN hypertension; DM diabetes mellitus; COPD chronic obstructive pulmonary disease

## 2020-11-11 NOTE — Assessment & Plan Note (Signed)
Patient reports improved compliance now as of the last month

## 2020-11-13 ENCOUNTER — Encounter (INDEPENDENT_AMBULATORY_CARE_PROVIDER_SITE_OTHER): Payer: HMO | Admitting: Ophthalmology

## 2020-11-24 ENCOUNTER — Telehealth: Payer: HMO

## 2020-12-17 ENCOUNTER — Other Ambulatory Visit: Payer: Self-pay | Admitting: Internal Medicine

## 2020-12-17 ENCOUNTER — Telehealth: Payer: Self-pay

## 2020-12-17 NOTE — Telephone Encounter (Signed)
Tie is request a follow up for cognitive testing due to the patient not remembering the three items using for testing at home.  Tie number is [phone] she is with [facility]

## 2020-12-18 NOTE — Telephone Encounter (Signed)
Spoke with Tie and she stated that she performed a mini cognitive test and the patient could not recall the 3 items that she was asked to remember. Tie is requesting additional cognitive testing be done. Does the patient need a in office visit to discuss further. Last office visit 10-22-2020.

## 2020-12-18 NOTE — Telephone Encounter (Signed)
I'm not sure who Tie is and what relationship they are to patient. If patient wants visit to address please schedule.

## 2020-12-19 NOTE — Telephone Encounter (Signed)
Called patient. LVM asking her to give our office a call to discuss cognitive testing. Office number was provided.

## 2020-12-22 ENCOUNTER — Telehealth: Payer: Self-pay

## 2020-12-23 ENCOUNTER — Encounter (INDEPENDENT_AMBULATORY_CARE_PROVIDER_SITE_OTHER): Payer: Self-pay | Admitting: Ophthalmology

## 2020-12-23 ENCOUNTER — Telehealth: Payer: Self-pay | Admitting: Pharmacist

## 2020-12-23 ENCOUNTER — Other Ambulatory Visit: Payer: Self-pay

## 2020-12-23 ENCOUNTER — Ambulatory Visit (INDEPENDENT_AMBULATORY_CARE_PROVIDER_SITE_OTHER): Payer: HMO | Admitting: Ophthalmology

## 2020-12-23 DIAGNOSIS — E113311 Type 2 diabetes mellitus with moderate nonproliferative diabetic retinopathy with macular edema, right eye: Secondary | ICD-10-CM | POA: Diagnosis not present

## 2020-12-23 DIAGNOSIS — H35071 Retinal telangiectasis, right eye: Secondary | ICD-10-CM

## 2020-12-23 DIAGNOSIS — E113312 Type 2 diabetes mellitus with moderate nonproliferative diabetic retinopathy with macular edema, left eye: Secondary | ICD-10-CM | POA: Diagnosis not present

## 2020-12-23 MED ORDER — BEVACIZUMAB 2.5 MG/0.1ML IZ SOSY
2.5000 mg | PREFILLED_SYRINGE | INTRAVITREAL | Status: AC | PRN
  Administered 2020-12-23: 2.5 mg via INTRAVITREAL

## 2020-12-23 MED ORDER — SIMVASTATIN 20 MG PO TABS: 20.0000 mg | ORAL_TABLET | Freq: Every day | ORAL | 1 refills | Status: DC

## 2020-12-23 MED ORDER — GLIMEPIRIDE 4 MG PO TABS: ORAL_TABLET | ORAL | 1 refills | Status: DC

## 2020-12-23 NOTE — Telephone Encounter (Signed)
Refill has been sent to the patient's pharmacy.  

## 2020-12-23 NOTE — Assessment & Plan Note (Signed)
Excellent compliance with CPAP use over the last 2 months consistent with longer duration of effective antivegF and no recurrences now at 7-week follow-up OD

## 2020-12-23 NOTE — Progress Notes (Signed)
12/23/2020     CHIEF COMPLAINT Patient presents for  Chief Complaint  Patient presents with   Retina Follow Up      HISTORY OF PRESENT ILLNESS: Whitney [name] is a 86 y.o. female who presents to the clinic today for:   HPI     Retina Follow Up   Patient presents with  Diabetic Retinopathy.  In right eye.  This started 6 weeks ago.  Severity is moderate.  Duration of 6 weeks.        Comments   6 week f/u OD with OCT and possible Avastin   Pt denies visual changes since previous visit. Pt denies any new flashes or floaters. Pt denies any eye pain.  Type 2 Diabetic. Diagnosed since 1993. A1c: 7.7  Eye Meds: Cosopt BID OU      Last edited by Reather Littler, COA on 12/23/2020  9:45 AM.      Referring physician: [name], MD [place],  [place] [id number]  HISTORICAL INFORMATION:   Selected notes from the MEDICAL RECORD NUMBER    Lab Results  Component Value Date   HGBA1C 7.7 (A) 10/22/2020     CURRENT MEDICATIONS: Current Outpatient Medications (Ophthalmic Drugs)  Medication Sig   dorzolamide-timolol (COSOPT) 22.3-6.8 MG/ML ophthalmic solution Instill 1 drop in each eye twice a day   DUREZOL 0.05 % EMUL    No current facility-administered medications for this visit. (Ophthalmic Drugs)   Current Outpatient Medications (Other)  Medication Sig   aspirin 81 MG tablet Take 1 tablet (81 mg total) by mouth daily.   blood glucose meter kit and supplies KIT Use to test blood sugar up to 3 times daily. DX E11.9   Calcium Carb-Cholecalciferol (CALCIUM-VITAMIN D) 500-200 MG-UNIT tablet Take 1 tablet by mouth 2 (two) times daily with a meal.   diclofenac Sodium (VOLTAREN) 1 % GEL Apply 4 g topically 4 (four) times daily.   empagliflozin (JARDIANCE) 25 MG TABS tablet Take 1 tablet (25 mg total) by mouth daily before breakfast.   glimepiride (AMARYL) 4 MG tablet TAKE 1 TABLET EVERY DAY BEFORE BREAKFAST   Lancet Devices (ACCU-CHEK  SOFTCLIX) lancets Use to test blood sugar up to 3 times a day. DX E11.9   lisinopril-hydrochlorothiazide (ZESTORETIC) 20-12.5 MG tablet Take 1 tablet by mouth daily.   metFORMIN (GLUCOPHAGE) 1000 MG tablet Take 1 tablet (1,000 mg total) by mouth 2 (two) times daily with a meal.   Multiple Vitamin (MULTI VITAMIN DAILY PO) Take one by mouth daily   ONETOUCH VERIO test strip Use to test blood sugar 3 times a day   simvastatin (ZOCOR) 20 MG tablet Take 1 tablet (20 mg total) by mouth at bedtime.   No current facility-administered medications for this visit. (Other)      REVIEW OF SYSTEMS:    ALLERGIES No Known Allergies  PAST MEDICAL HISTORY Past Medical History:  Diagnosis Date   Anxiety    Baker's cyst    Chest pain    Diabetes mellitus type 2, uncontrolled ([place])    Diabetes mellitus without complication (HCC)    Disc disease, degenerative, cervical    Diverticulosis of colon    DJD (degenerative joint disease) of knee    Facial pain, atypical    GERD (gastroesophageal reflux disease)    Glaucoma    Hypercholesteremia    Hypertension    Lumbar spondylosis    Obesity    Onychomycosis    Osteopenia  Past Surgical History:  Procedure Laterality Date   right eye surgery  09/2010   retina in right eye   VAGINAL HYSTERECTOMY  2002   Dr. Kem Kays repair    FAMILY HISTORY Family History  Problem Relation Age of Onset   Diabetes Mother    Colon cancer Neg Hx    Stomach cancer Neg Hx     SOCIAL HISTORY Social History   Tobacco Use   Smoking status: Never   Smokeless tobacco: Never  Vaping Use   Vaping Use: Never used  Substance Use Topics   Alcohol use: No   Drug use: No         OPHTHALMIC EXAM:  Base Eye Exam     Visual Acuity (ETDRS)       Right Left   Dist cc 20/30 +2 20/30 +2   Dist ph cc NI 20/25 -2    Correction: Glasses         Tonometry (Tonopen, 9:52 AM)       Right Left   Pressure 12 12         Pupils       Pupils Dark  Light Shape React APD   Right PERRL 3 3 Irregular Minimal None   Left PERRL 3 3 Irregular Minimal None         Visual Fields (Counting fingers)       Left Right    Full Full         Extraocular Movement       Right Left    Full, Ortho Full, Ortho         Neuro/Psych     Oriented x3: Yes   Mood/Affect: Normal         Dilation     Right eye: 1.0% Mydriacyl, 2.5% Phenylephrine @ 9:52 AM           Slit Lamp and Fundus Exam     External Exam       Right Left   External Normal Normal         Slit Lamp Exam       Right Left   Lids/Lashes Normal Normal   Conjunctiva/Sclera White and quiet White and quiet   Cornea Clear Clear   Anterior Chamber Deep and quiet Deep and quiet   Iris Round and reactive Round and reactive   Lens Posterior chamber intraocular lens Posterior chamber intraocular lens   Anterior Vitreous Normal Normal         Fundus Exam       Right Left   Posterior Vitreous Normal    Disc Normal    C/D Ratio 0.55    Macula Microaneurysms,, trace clinical CSME    Vessels NPDR, moderate    Periphery Normal             IMAGING AND PROCEDURES  Imaging and Procedures for 12/23/20  OCT, Retina - OU - Both Eyes       Right Eye Quality was good. Scan locations included subfoveal. Central Foveal Thickness: 254. Progression has been stable. Findings include abnormal foveal contour, cystoid macular edema.   Left Eye Quality was good. Scan locations included subfoveal. Central Foveal Thickness: 224. Progression has been stable. Findings include abnormal foveal contour.   Notes OS vastly improved macular edema and now some 2 weeks after last injection and vastly improved from last visit 6-week previous when the right eye was injected  OD, much vastly improved with only residual macular changes which  look chronic likely from prior untreated MAC-TEL superimposed.  This may be the best baseline we obtain.  But nonetheless longer  intervals of therapy now possible     Intravitreal Injection, Pharmacologic Agent - OD - Right Eye       Time Out 12/23/2020. 10:38 AM. Confirmed correct patient, procedure, site, and patient consented.   Anesthesia Topical anesthesia was used. Anesthetic medications included [place] 3.5%.   Procedure Preparation included 10% betadine to eyelids, 5% betadine to ocular surface, Ofloxacin . A 30 gauge needle was used.   Injection: 2.5 mg bevacizumab 2.5 MG/0.1ML   Route: Intravitreal, Site: Right Eye   NDC: [phone], Lot: [id number]   Post-op Post injection exam found visual acuity of at least counting fingers. The patient tolerated the procedure well. There were no complications. The patient received written and verbal post procedure care education. Post injection medications included ocuflox.              ASSESSMENT/PLAN:  Retinal telangiectasia of right eye Excellent compliance with CPAP use over the last 2 months consistent with longer duration of effective antivegF and no recurrences now at 7-week follow-up OD  Moderate nonproliferative diabetic retinopathy of right eye with macular edema (HCC) OD looks great currently at 6-week follow-up.  Coincident with good excellent CPAP compliance with some 2 weeks of noncompliance midportion of summer 2022  Moderate nonproliferative diabetic retinopathy of left eye with macular edema associated with type 2 diabetes mellitus ([place]) Follow-up as scheduled     ICD-10-CM   1. Moderate nonproliferative diabetic retinopathy of right eye with macular edema associated with type 2 diabetes mellitus (HCC)  E11.3311 OCT, Retina - OU - Both Eyes    Intravitreal Injection, Pharmacologic Agent - OD - Right Eye    bevacizumab (AVASTIN) SOSY 2.5 mg    2. Retinal telangiectasia of right eye  H35.071     3. Moderate nonproliferative diabetic retinopathy of left eye with macular edema associated with type 2 diabetes mellitus ([place])  V61.6073        1.  2.  3.  Ophthalmic Meds Ordered this visit:  Meds ordered this encounter  Medications   bevacizumab (AVASTIN) SOSY 2.5 mg       Return in about 9 weeks (around 02/24/2021) for dilate, OD, AVASTIN OCT, and OS as scheduled.  There are no Patient Instructions on file for this visit.   Explained the diagnoses, plan, and follow up with the patient and they expressed understanding.  Patient expressed understanding of the importance of proper follow up care.   Clent Demark Davaun Quintela M.D. Diseases & Surgery of the Retina and Vitreous [facility]ina [facility] Di[facility] 12/23/20     Abbreviations: M myopia (nearsighted); A astigmatism; H hyperopia (farsighted); P presbyopia; Mrx spectacle prescription;  CTL contact lenses; OD right eye; OS left eye; OU both eyes  XT exotropia; ET esotropia; PEK punctate epithelial keratitis; PEE punctate epithelial erosions; DES dry eye syndrome; MGD meibomian gland dysfunction; ATs artificial tears; PFAT's preservative free artificial tears; [place] nuclear sclerotic cataract; PSC posterior subcapsular cataract; ERM epi-retinal membrane; PVD posterior vitreous detachment; RD retinal detachment; DM diabetes mellitus; DR diabetic retinopathy; NPDR non-proliferative diabetic retinopathy; PDR proliferative diabetic retinopathy; CSME clinically significant macular edema; DME diabetic macular edema; dbh dot blot hemorrhages; CWS cotton wool spot; POAG primary open angle glaucoma; C/D cup-to-disc ratio; HVF humphrey visual field; GVF goldmann visual field; OCT optical coherence tomography; IOP intraocular pressure; BRVO Branch retinal vein occlusion; CRVO central retinal vein occlusion; CRAO  central retinal artery occlusion; BRAO branch retinal artery occlusion; RT retinal tear; SB scleral buckle; PPV pars plana vitrectomy; VH Vitreous hemorrhage; PRP panretinal laser photocoagulation; IVK intravitreal kenalog; VMT vitreomacular traction; MH Macular hole;  NVD  neovascularization of the disc; NVE neovascularization elsewhere; AREDS age related eye disease study; ARMD age related macular degeneration; POAG primary open angle glaucoma; EBMD epithelial/anterior basement membrane dystrophy; ACIOL anterior chamber intraocular lens; IOL intraocular lens; PCIOL posterior chamber intraocular lens; Phaco/IOL phacoemulsification with intraocular lens placement; [place] photorefractive keratectomy; LASIK laser assisted in situ keratomileusis; HTN hypertension; DM diabetes mellitus; COPD chronic obstructive pulmonary disease

## 2020-12-23 NOTE — Progress Notes (Signed)
Chronic Care Management Pharmacy Assistant   Name: Whitney [name]  MRN: [id number] DOB: 1935-02-18   Reason for Encounter: Disease State   Conditions to be addressed/monitored: HTN   Recent office visits:  10/22/20 [name], MD (PCP) Type 2 diabetes mellitus with hyperlipidemia, no med changes  Recent consult visits:  11/11/20 Hurman Horn, MD-Ophthalmology Retina Follow Up Med changes: bevacizumab (avastin) SOST 2.5 mg 11/03/20 [place], Clent Demark, MD-Ophthalmology Retina Follow U, no med changes  Hospital visits:  None in previous 6 months  Medications: Outpatient Encounter Medications as of 12/23/2020  Medication Sig Note   aspirin 81 MG tablet Take 1 tablet (81 mg total) by mouth daily.    blood glucose meter kit and supplies KIT Use to test blood sugar up to 3 times daily. DX E11.9    Calcium Carb-Cholecalciferol (CALCIUM-VITAMIN D) 500-200 MG-UNIT tablet Take 1 tablet by mouth 2 (two) times daily with a meal.    diclofenac Sodium (VOLTAREN) 1 % GEL Apply 4 g topically 4 (four) times daily.    dorzolamide-timolol (COSOPT) 22.3-6.8 MG/ML ophthalmic solution Instill 1 drop in each eye twice a day    DUREZOL 0.05 % EMUL  03/17/2015: Received from: External Pharmacy   empagliflozin (JARDIANCE) 25 MG TABS tablet Take 1 tablet (25 mg total) by mouth daily before breakfast.    glimepiride (AMARYL) 4 MG tablet TAKE 1 TABLET EVERY DAY BEFORE BREAKFAST    Lancet Devices (ACCU-CHEK SOFTCLIX) lancets Use to test blood sugar up to 3 times a day. DX E11.9    lisinopril-hydrochlorothiazide (ZESTORETIC) 20-12.5 MG tablet Take 1 tablet by mouth daily.    metFORMIN (GLUCOPHAGE) 1000 MG tablet Take 1 tablet (1,000 mg total) by mouth 2 (two) times daily with a meal.    Multiple Vitamin (MULTI VITAMIN DAILY PO) Take one by mouth daily 06/19/2013: Received from: External Pharmacy   [facility] VERIO test strip Use to test blood sugar 3 times a day    simvastatin (ZOCOR) 20 MG tablet Take  1 tablet (20 mg total) by mouth at bedtime.    No facility-administered encounter medications on file as of 12/23/2020.    Recent Office Vitals: BP Readings from Last 3 Encounters:  10/22/20 130/78  05/15/20 125/71  03/17/20 (!) 168/80   Pulse Readings from Last 3 Encounters:  10/22/20 71  05/15/20 83  03/17/20 62    Wt Readings from Last 3 Encounters:  10/22/20 148 lb 12.8 oz (67.5 kg)  05/15/20 154 lb (69.9 kg)  03/17/20 145 lb (65.8 kg)     Kidney Function Lab Results  Component Value Date/Time   CREATININE 0.81 03/17/2020 09:50 AM   CREATININE 0.77 09/04/2019 02:40 PM   GFR 67.28 03/17/2020 09:50 AM   GFRNONAA 87.17 02/16/2010 10:03 AM   GFRAA 156 02/19/2008 11:03 AM    BMP Latest Ref Rng & Units 03/17/2020 09/04/2019 09/29/2018  Glucose 70 - 99 mg/dL 272(H) 238(H) 149(H)  BUN 6 - 23 mg/dL _0 Creatinine 0.40 - 1.20 mg/dL 0.81 0.77 0.75  Sodium 135 - 145 mEq/L 138 138 142  Potassium 3.5 - 5.1 mEq/L 3.8 3.4(L) 4.1  Chloride 96 - 112 mEq/L 100 101 105  CO2 19 - 32 mEq/L _1 Calcium 8.4 - 10.5 mg/dL 10.6(H) 10.3 10.1     Contacted patient on 12/23/20 to discuss hypertension disease state  Current antihypertensive regimen:  Lisinopril-hctz 20-12.5 mg  Patient verbally confirms she is taking the above medications as  directed. Yes  How often are you checking your Blood Pressure? 3-5x per week  she checks her blood pressure in the morning before taking her medication.  Current home BP readings: 148/75 67  DATE:             BP               PULSE 12/08/20  146/68  10/25/20  160/59  Wrist or arm cuff:uses arm cuff Caffeine intake:coffee in the mornings  Salt intake:no salt OTC medications including pseudoephedrine or NSAIDs?none  Any readings above 180/120? No If yes any symptoms of hypertensive emergency? patient denies any symptoms of high blood pressure   What recent interventions/DTPs have been made by any provider to improve Blood Pressure  control since last CPP Visit: none noted  Any recent hospitalizations or ED visits since last visit with CPP? No  What diet changes have been made to improve Blood Pressure Control?  Patient has not made any changes to diet  What exercise is being done to improve your Blood Pressure Control?  Patient states she does a program on that come on t.v for adults from [facility] in the mornings  Adherence Review: Is the patient currently on ACE/ARB medication? Yes Does the patient have >5 day gap between last estimated fill dates? No   Star Rating Drugs:  Medication:  Last Fill: Day Supply Simvastatin 20 mg   10/21/20 90 Metformin 1000 mg 10/27/20 90 Lisinopril-hctz 20-12.5 mg7/18/22 90  Jardiance 25 mg 10/21/20 90 (Patient states that her Vania Rea is $240 and is to expensive)  Care Gaps: Annual wellness visit in last year? No Most Recent BP reading:  If Diabetic: Most recent A1C reading:7.9 03/17/20 Last eye exam / retinopathy screening:12/23/20 Last diabetic foot exam:10/22/20  Patient has appt on 01/09/21 [facility] [place] Pharmacist Assistant [phone]   Time spent:40

## 2020-12-23 NOTE — Assessment & Plan Note (Addendum)
OD looks great currently at 6-week follow-up.  Coincident with good excellent CPAP compliance with some 2 weeks of noncompliance midportion of summer 2022

## 2020-12-23 NOTE — Addendum Note (Signed)
Addended by: Thomes Cake on: 12/23/2020 09:42 AM   Modules accepted: Orders

## 2020-12-23 NOTE — Assessment & Plan Note (Signed)
Follow up as scheduled.  

## 2020-12-24 DIAGNOSIS — Z1272 Encounter for screening for malignant neoplasm of vagina: Secondary | ICD-10-CM | POA: Diagnosis not present

## 2020-12-24 DIAGNOSIS — N898 Other specified noninflammatory disorders of vagina: Secondary | ICD-10-CM | POA: Diagnosis not present

## 2020-12-24 DIAGNOSIS — Z6828 Body mass index (BMI) 28.0-28.9, adult: Secondary | ICD-10-CM | POA: Diagnosis not present

## 2020-12-24 DIAGNOSIS — Z01411 Encounter for gynecological examination (general) (routine) with abnormal findings: Secondary | ICD-10-CM | POA: Diagnosis not present

## 2020-12-24 DIAGNOSIS — B373 Candidiasis of vulva and vagina: Secondary | ICD-10-CM | POA: Diagnosis not present

## 2021-01-07 DIAGNOSIS — Z961 Presence of intraocular lens: Secondary | ICD-10-CM | POA: Diagnosis not present

## 2021-01-07 DIAGNOSIS — H4323 Crystalline deposits in vitreous body, bilateral: Secondary | ICD-10-CM | POA: Diagnosis not present

## 2021-01-07 DIAGNOSIS — H02423 Myogenic ptosis of bilateral eyelids: Secondary | ICD-10-CM | POA: Diagnosis not present

## 2021-01-07 DIAGNOSIS — H401131 Primary open-angle glaucoma, bilateral, mild stage: Secondary | ICD-10-CM | POA: Diagnosis not present

## 2021-01-07 DIAGNOSIS — E113313 Type 2 diabetes mellitus with moderate nonproliferative diabetic retinopathy with macular edema, bilateral: Secondary | ICD-10-CM | POA: Diagnosis not present

## 2021-01-07 LAB — HM DIABETES EYE EXAM

## 2021-01-09 ENCOUNTER — Encounter: Payer: HMO | Attending: Internal Medicine | Admitting: Dietician

## 2021-01-09 ENCOUNTER — Other Ambulatory Visit: Payer: Self-pay

## 2021-01-09 ENCOUNTER — Encounter: Payer: Self-pay | Admitting: Dietician

## 2021-01-09 DIAGNOSIS — E1169 Type 2 diabetes mellitus with other specified complication: Secondary | ICD-10-CM | POA: Insufficient documentation

## 2021-01-09 DIAGNOSIS — E785 Hyperlipidemia, unspecified: Secondary | ICD-10-CM | POA: Insufficient documentation

## 2021-01-09 NOTE — Progress Notes (Signed)
    Chronic Care Management Pharmacy Assistant   Name: Whitney [name]  MRN: [id number] DOB: 1935/01/07  Spoke with patient who states that this month Dr. Sharlet Salina  send in a refill to pharmacy and she did not have to pay anything this month. She said that she was told that she was in the donut hole the last time and that is why she has to pay $240. Patient stated that she will wait before she tries for patient assistance.  [place] Pharmacist Assistant [phone]

## 2021-01-09 NOTE — Progress Notes (Signed)
Medical Nutrition Therapy  Appointment Start time:  [phone]  Appointment End time:  1010 Patient is here today alone.  Primary concerns today: She would like to better control her blood sugar and thinks that it may be high due to what she eats.  She would like to maintain her weight. Referral diagnosis: Type 2 diabetes with HLD Preferred learning style: no preference indicated Learning readiness: ready   NUTRITION ASSESSMENT   Anthropometrics  61" 150 lbs  >200 lbs 2014  Clinical Medical Hx: Type 2 diabetes, HLD, HTN Medications: Jardiance, Glimepiride, Metformin Labs: A1C 7.7% 10/22/2020, 7.9% 03/17/2020, eGFR 67 Notable Signs/Symptoms: Checks blood glucose each morning.  144-200 fasting  Lifestyle & Dietary Hx Patient lives alone.  She still drive.   States that she can do without meat. She doesn't cook much.  Uses canned food most often.  Some fresh vegetables. Taste alterations and decreased appetite. She has top dentures.  "Sometimes I get tired of chewing."  Estimated daily fluid intake: she feels that she is not drinking enough at times Supplements: calcium with vitamin D Sleep: 6-7 hours broken sleep Stress / self-care: low Current average weekly physical activity: [facility] senior exercise on TV 3-4 times weekly  24-Hr Dietary Recall First Meal: banana this am OR cereal (plain or multigrain) and unsweetened almond milk OR oatmeal  (instant or reg) OR egg egg, [place] bacon, grits Snack: none Second Meal: tomato sandwich OR PB &J sandwich, crackers, seldom fresh fruit, unsweetened applesauce or yogurt, occasional vegetable Snack: none Third Meal: salad with fruit, cheese, nuts, raisins OR canned or fresh cooked vegetables, baked or boiled chicken breast or wing, occasional rice or potatoes or seldom mac and cheese OR vegetable plate OR air fried [place] burger with vegetables Snack: none OR [employer] vegetables after church on Sunday Beverages: water, coffee (black),  occasional green tea, occasional regular soda  NUTRITION DIAGNOSIS  NB-1.1 Food and nutrition-related knowledge deficit As related to balance of carbohydrates, protein, and fat.  As evidenced by diet hx and patient report.   NUTRITION INTERVENTION  Nutrition education (E-1) on the following topics:  My plate, foods that affect blood glucose and balance in meal Recommended protein source at each meal Benefits of exercise and options when she misses her usual class on TV Current blood glucose and goals Taught blood glucose meter as hers is not working.  Provided Accu-Chek Guide Me Lot [id number], Expiration 02/18/2022 Beverage choices and importance of staying hydrated Basic meal planning  Handouts Provided Include  My plate Meal plan card How to thrive book from [place] for Change Teaching method utilized: Visual & Auditory  Demonstrated degree of understanding via: Teach Back  Barriers to learning/adherence to lifestyle change: none  Goals Established by Pt Continue to stay active. Search for "You tube arm chair exercises"  or "You tube sit and be fit" on days that you miss your senior class on TV.  Choose beverages without sugar most of the time. Stay hydrated.  Continue to take your medication as prescribed Continue to check your blood sugar as prescribed  Simple meal ideas: Homemade vegetable soup and 1/2 cheese sandwich, fresh fruit Homemade bean soup and salad, fresh fruit Sandwich (tuna, chicken, peanut butter) with raw vegetables, fresh fruit Vegetable plate:  beans, 1/2 sweet potato, vegetables Salad with increased vegetables, rinsed canned beans, apple or berries Regular oatmeal with fresh fruit, walnuts or almonds Egg with peppers, onions and spinach, grits   MONITORING & EVALUATION Dietary  intake, weekly physical activity, and prn.  Next Steps  Patient is to call for questions.

## 2021-01-16 ENCOUNTER — Ambulatory Visit (INDEPENDENT_AMBULATORY_CARE_PROVIDER_SITE_OTHER): Payer: HMO

## 2021-01-16 NOTE — Progress Notes (Signed)
Chronic Care Management Pharmacy Note  01/16/2021 Name:  Whitney [name] MRN:  [id number] DOB:  [birth year]  Summary: - Patient reports that BG has been elevated as of late, most recently averaging 190-200's - notes that she has been eating more sweets recently- started following with nutrition last week (9/30) -Has been checking blood pressure, improved from previous visit, however can still be elevated at times - most recent averages are 130-148/69-75 - checking blood pressure in AM after she drinks her coffee  Recommendations/Changes made from today's visit: - Patient preferred to work closer with nutrition before making adjustments to medications for improved BG control - has recently picked up 3 month supply of jardiance, will plan to continue current medications for now, follow up in 8 weeks to assess how changes in diet have impacted BG  - Patient will start to monitor BP later in day as well, also advised for reduction in caffeine intake and switching morning coffee to decaf / half caf for improvement in BP control   Subjective: Whitney [name] is an 86 y.o. year old female who is a primary patient of [name], MD.  The CCM team was consulted for assistance with disease management and care coordination needs.    Engaged with patient by telephone for follow up visit in response to provider referral for pharmacy case management and/or care coordination services.   Consent to Services:  The patient was given information about Chronic Care Management services, agreed to services, and gave verbal consent prior to initiation of services.  Please see initial visit note for detailed documentation.   Patient Care Team: [name], MD as PCP - General (Internal Medicine) Noralee Space, MD as Consulting Physician (Pulmonary Disease) Gean Birchwood, DPM as Consulting Physician (Podiatry) Charlton Haws, [facility] as Pharmacist (Pharmacist)  Pt has lived here  her whole life, she has 4 children, 4 grandchildren, 3 great-grandchildren. Patient lives alone; she takes care of house work.  Recent office visits: 10/22/20 Dr Sharlet Salina - PCP f/u - A1c 7.7% - at goal for patient, declined to start new medication - referred to nutrition  Recent consult visits: 12/23/20 Dr Zadie Rhine (ophthalmology): retina f/u (diabetic retinopathy); given intravitreal injection Avastin - 9 week follow up 11/11/20 Dr Zadie Rhine (ophthalmology) : diabetic retinopathy of right eye  - repeat avastin injection - f/u in 5-6 weeks  11/03/20 Dr Zadie Rhine (ophthalmology): diabetic retinopathy: eylea repeated today, - improved CPAP mask fit f/u in 2 weeks   Hospital visits: None in previous 6 months   Objective:  Lab Results  Component Value Date   CREATININE 0.81 03/17/2020   BUN 11 03/17/2020   GFR 67.28 03/17/2020   GFRNONAA 87.17 02/16/2010   GFRAA 156 02/19/2008   NA 138 03/17/2020   K 3.8 03/17/2020   CALCIUM 10.6 (H) 03/17/2020   CO2 29 03/17/2020   GLUCOSE 272 (H) 03/17/2020    Lab Results  Component Value Date/Time   HGBA1C 7.7 (A) 10/22/2020 10:29 AM   HGBA1C 7.9 (H) 03/17/2020 09:50 AM   HGBA1C 7.5 (H) 09/04/2019 02:40 PM   GFR 67.28 03/17/2020 09:50 AM   GFR 86.72 09/04/2019 02:40 PM   MICROALBUR 2.0 (H) 05/19/2015 09:21 AM   MICROALBUR 2.5 (H) 05/14/2014 10:01 AM    Last diabetic Eye exam:  Lab Results  Component Value Date/Time   HMDIABEYEEXA Retinopathy (A) 10/18/2018 12:00 AM    Last diabetic Foot exam: No results found for: HMDIABFOOTEX   Lab Results  Component Value Date   CHOL 145 03/17/2020   HDL 40.50 03/17/2020   LDLCALC 80 03/17/2020   TRIG 124.0 03/17/2020   CHOLHDL 4 03/17/2020    Hepatic Function Latest Ref Rng & Units 03/17/2020 09/04/2019 09/29/2018  Total Protein 6.0 - 8.3 g/dL 8.0 8.0 7.4  Albumin 3.5 - 5.2 g/dL 4.1 4.1 3.9  AST 0 - 37 U/L '21 19 19  ' ALT 0 - 35 U/L '18 16 17  ' Alk Phosphatase 39 - 117 U/L 45 54 47  Total Bilirubin 0.2 -  1.2 mg/dL 1.0 1.1 0.7  Bilirubin, Direct 0.0 - 0.3 mg/dL - - -    Lab Results  Component Value Date/Time   TSH 1.13 09/14/2013 09:59 AM   TSH 1.22 08/23/2012 10:19 AM    CBC Latest Ref Rng & Units 03/17/2020 09/04/2019 09/29/2018  WBC 4.0 - 10.5 K/uL 5.4 7.5 6.5  Hemoglobin 12.0 - 15.0 g/dL 12.9 12.5 12.4  Hematocrit 36.0 - 46.0 % 39.7 38.0 37.4  Platelets 150.0 - 400.0 K/uL 222.0 199.0 193.0    Lab Results  Component Value Date/Time   VD25OH 66 08/23/2011 10:26 AM   VD25OH 40 08/18/2009 07:43 PM    Clinical ASCVD: No  The ASCVD Risk score (Arnett DK, et al., 2019) failed to calculate for the following reasons:   The 2019 ASCVD risk score is only valid for ages 23 to 51    Depression screen PHQ 2/9 01/09/2021 10/22/2020 10/08/2019  Decreased Interest 0 0 0  Down, Depressed, Hopeless 0 0 0  PHQ - 2 Score 0 0 0  Altered sleeping - - -  Tired, decreased energy - - -  Change in appetite - - -  Feeling bad or failure about yourself  - - -  Trouble concentrating - - -  Moving slowly or fidgety/restless - - -  Suicidal thoughts - - -  PHQ-9 Score - - -  Difficult doing work/chores - - -  Some recent data might be hidden     Social History   Tobacco Use  Smoking Status Never  Smokeless Tobacco Never   BP Readings from Last 3 Encounters:  10/22/20 130/78  05/15/20 125/71  03/17/20 (!) 168/80   Pulse Readings from Last 3 Encounters:  10/22/20 71  05/15/20 83  03/17/20 62   Wt Readings from Last 3 Encounters:  01/09/21 150 lb (68 kg)  10/22/20 148 lb 12.8 oz (67.5 kg)  05/15/20 154 lb (69.9 kg)   BMI Readings from Last 3 Encounters:  01/09/21 28.34 kg/m  10/22/20 28.12 kg/m  05/15/20 29.10 kg/m    Assessment/Interventions: Review of patient past medical history, allergies, medications, health status, including review of consultants reports, laboratory and other test data, was performed as part of comprehensive evaluation and provision of chronic care management  services.   SDOH:  (Social Determinants of Health) assessments and interventions performed: Yes  SDOH Screenings   Alcohol Screen: Not on file  Depression (PHQ2-9): Low Risk    PHQ-2 Score: 0  Financial Resource Strain: Low Risk    Difficulty of Paying Living Expenses: Not hard at all  Food Insecurity: Not on file  Housing: Not on file  Physical Activity: Not on file  Social Connections: Not on file  Stress: Not on file  Tobacco Use: Low Risk    Smoking Tobacco Use: Never   Smokeless Tobacco Use: Never  Transportation Needs: Not on file    [facility]  No Known Allergies  Medications Reviewed  Today     Reviewed by Tomasa Blase, [facility] (Pharmacist) on 01/16/21 at [place] List Status: <None>   Medication Order Taking? Sig Documenting Provider Last Dose Status Informant  aspirin 81 MG tablet [id number]  Take 1 tablet (81 mg total) by mouth daily. [name], MD  Active   blood glucose meter kit and supplies KIT [id number]  Use to test blood sugar up to 3 times daily. DX E11.9 [name], MD  Active   Calcium Carb-Cholecalciferol (CALCIUM-VITAMIN D) 500-200 MG-UNIT tablet [id number]  Take 1 tablet by mouth 2 (two) times daily with a meal. [name], MD  Active   diclofenac Sodium (VOLTAREN) 1 % GEL [id number]  Apply 4 g topically 4 (four) times daily. [name], MD  Active   dorzolamide-timolol (COSOPT) 22.3-6.8 MG/ML ophthalmic solution [id number]  Instill 1 drop in each eye twice a day [name], MD  Active   DUREZOL 0.05 % [facility] [id number]   [provider]  Active            Med Note Izola Price, AMY R   Mon Mar 17, 2015  9:44 AM) Received from: External Pharmacy  empagliflozin (JARDIANCE) 25 MG TABS tablet [id number] Yes Take 1 tablet (25 mg total) by mouth daily before breakfast. [name], MD Taking Active   glimepiride (AMARYL) 4 MG tablet [id number] Yes TAKE 1 TABLET EVERY DAY BEFORE BREAKFAST  [name], MD Taking Active   Lancet Devices [facility]) lancets [id number]  Use to test blood sugar up to 3 times a day. DX E11.9 [name], MD  Active   lisinopril-hydrochlorothiazide (ZESTORETIC) 20-12.5 MG tablet [id number] Yes Take 1 tablet by mouth daily. [name], MD Taking Active   metFORMIN (GLUCOPHAGE) 1000 MG tablet [id number] Yes Take 1 tablet (1,000 mg total) by mouth 2 (two) times daily with a meal. [name], MD Taking Active   Multiple Vitamin (MULTI VITAMIN DAILY PO) [id number]  Take one by mouth daily [provider]  Active            Med Note Wynona Neat, [facility] N   Tue Jun 19, 2013  9:03 AM) Received from: [place] test strip [id number]  Use to test blood sugar 3 times a day [name], MD  Active   simvastatin (ZOCOR) 20 MG tablet [id number] Yes Take 1 tablet (20 mg total) by mouth at bedtime. [name], MD Taking Active             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 associated with type 2 diabetes mellitus ([place]) 08/13/2019   Moderate nonproliferative diabetic retinopathy of right eye with macular edema ([place]) 08/13/2019   Type 2 macular telangiectasis, left 08/13/2019   Retinal telangiectasia of right eye 08/13/2019   OSA on CPAP 07/20/2017   Venous insufficiency 04/26/2017   Sensorineural hearing loss (SNHL), bilateral 12/08/2016   Routine general medical examination at a health care facility 05/25/2016   Type 2 diabetes mellitus with hyperlipidemia ([place]) 02/23/2012   Obesity 08/23/2011   GLAUCOMA 08/24/2009   Left shoulder pain 02/19/2008   Hyperlipidemia associated with type 2 diabetes mellitus ([place]) 12/22/2006   Essential hypertension 12/22/2006   GERD 12/22/2006   [place] DISEASE, CERVICAL 12/22/2006   Osteopenia 12/22/2006    Other chest pain 12/22/2006  Immunization History  Administered Date(s) Administered   Fluad Quad(high Dose 65+) 03/30/2019, 03/17/2020   Influenza Split 02/18/2011, 02/23/2012   Influenza Whole 01/19/2005, 02/17/2009, 02/16/2010   Influenza, High Dose Seasonal PF 02/14/2015, 04/21/2017, 02/02/2018   Influenza,inj,Quad PF,6+ Mos 03/12/2013, 02/07/2014   PFIZER(Purple Top)SARS-COV-2 Vaccination 05/04/2019, 05/28/2019, 01/29/2020   Pneumococcal Conjugate-13 05/25/2016   Pneumococcal Polysaccharide-23 04/12/1998, 02/16/2010   Td 08/21/2002   Tdap 08/23/2011    Conditions to be addressed/monitored:  Hypertension, Hyperlipidemia, and Diabetes  Care Plan : [place]  Updates made by Tomasa Blase, RPH since 01/16/2021 12:00 AM     Problem: Hypertension, Hyperlipidemia, and Diabetes   Priority: High     Long-Range Goal: Disease management   Start Date: 10/16/2020  Expected End Date: 04/18/2021  This Visit's Progress: Not on track  Recent Progress: On track  Priority: High  Note:   Current Barriers:  Unable to independently monitor therapeutic efficacy Unable to maintain control of BP, DM  Pharmacist Clinical Goal(s):  Patient will achieve adherence to monitoring guidelines and medication adherence to achieve therapeutic efficacy adhere to plan to optimize therapeutic regimen for BP, DM as evidenced by report of adherence to recommended medication management changes through collaboration with PharmD and provider.   Interventions: 1:1 collaboration with [name], MD regarding development and update of comprehensive plan of care as evidenced by provider attestation and co-signature Inter-disciplinary care team collaboration (see longitudinal plan of care) Comprehensive medication review performed; medication list updated in electronic medical record  Hypertension    BP goal is:  <140/90 Patient checks BP at home daily Patient home BP readings are  ranging: 148/76, 136/69, 147/74, 132/75, 136/76 Patient has failed these meds in the past: n/a Patient is currently controlled on the following medications: Lisinopril-HCTZ 20-12.5 mg daily AM   Patient denies sodium intake, reports that she does not have any salt in her house, does not to drinking 1.5 cups of coffee in the AM, and checks BP in AM, recommended reducing caffeine intake as well as checking BP as time later in the day to assess BP control without influence of caffeine - agreeable - will reach out should BP average >140/90   Plan: Continue current medications for now Consider increasing lisinopril-hctz to 2 tablets daily if BMP is stable / BP elevated with next check   Hyperlipidemia    LDL goal < 100 Lab Results  Component Value Date   [place] 80 03/17/2020  Patient has failed these meds in past: n/a Patient is currently controlled on the following medications: Simvastatin 20 mg HS Aspirin 81 mg daily   We discussed:  pt reports compliance with medications as above; LDL is at goal; pt endorses daily exercise and low cholesterol diet   Plan: Continue current medications   Diabetes  A1c goal <8%  Checking BG: Daily Recent FBG Readings: 197, 180, 210 Started following with nutrition 01/09/2021 - has had 1 appointment - focused on portion control, does admit to increase consumption of sweets recently  Patient has failed these meds in past: pioglitazone Patient is currently controlled on the following medications: Metformin 1000 mg BID Glimepiride 4 mg daily AM (started ~2012) Jardiance 25 mg daily AM Testing supplies -Patient has started following with nutrition, notes that she has improved her portion contorl -Pt would benefit from a GLP-1 for improved BG lowering and wt loss; considered adding Rybelsus vs changing Jardiance to Rybelsus - pt likely cannot afford two Tier 3 drugs at a time;  it has been over 6 months since A1c check/other labwork so deferred med changes     Plan: Continue current medications as patient declined changes at this time - encouraged patient to reach out to nutritionist to discuss diet more in detail to help improve BG control Consider changing Jardiance to Rybelsus  Patient Goals/Self-Care Activities Patient will:  - take medications as prescribed focus on medication adherence by pill box check glucose daily, document, and provide at future appointments check blood pressure daily, document, and provide at future appointments target a minimum of 150 minutes of moderate intensity exercise weekly engage in dietary modifications by reducing carb/sugar       Medication Assistance: None required.  Patient affirms current coverage meets needs.  Compliance/Adherence/Medication fill history: Care Gaps: Shingrix Covid booster (due 05/31/20)  Patient's preferred pharmacy is:  [facility] [place] [place] [address] Phone: [phone] Fax: [place] [address], [place] [address] Phone: [phone] Fax: [phone]  Uses pill box? Yes Pt endorses 100% compliance  We discussed: Current pharmacy is preferred with insurance plan and patient is satisfied with pharmacy services Patient decided to: Continue current medication management strategy  Care Plan and Follow Up Patient Decision:  Patient agrees to Care Plan and Follow-up.  Plan: Telephone follow up appointment with care management team member scheduled for:  1 months  Tomasa Blase, PharmD Clinical Pharmacist, [place]

## 2021-01-16 NOTE — Patient Instructions (Signed)
Whitney [name],  It was great to talk to you today!  Please call me with any questions or concerns.  Visit Information  PATIENT GOALS:  Goals Addressed             This Visit's Progress    Monitor and Manage My Blood Sugar-Diabetes Type 2       Timeframe:  Long-Range Goal Priority:  High Start Date:     10/15/20                        Expected End Date:   07/16/21                   Follow Up Date 03/2021   - check blood sugar at prescribed times - check blood sugar if I feel it is too high or too low - enter blood sugar readings and medication or insulin into daily log - take the blood sugar log to all doctor visits    Why is this important?   Checking your blood sugar at home helps to keep it from getting very high or very low.  Writing the results in a diary or log helps the doctor know how to care for you.  Your blood sugar log should have the time, date and the results.  Also, write down the amount of insulin or other medicine that you take.  Other information, like what you ate, exercise done and how you were feeling, will also be helpful.     Notes:      Track and Manage My Blood Pressure-Hypertension   On track    Timeframe:  Long-Range Goal Priority:  High Start Date:    10/15/20                         Expected End Date:   07/16/2021                  Follow Up Date 03/2021   - check blood pressure daily - write blood pressure results in a log or diary -Reduce salt in diet -Take blood pressure medication (lisinopril-Hctz) every day    Why is this important?   You won't feel high blood pressure, but it can still hurt your blood vessels.  High blood pressure can cause heart or kidney problems. It can also cause a stroke.  Making lifestyle changes like losing a little weight or eating less salt will help.  Checking your blood pressure at home and at different times of the day can help to control blood pressure.  If the doctor prescribes medicine remember to take  it the way the doctor ordered.  Call the office if you cannot afford the medicine or if there are questions about it.     Notes:         Patient verbalizes understanding of instructions provided today and agrees to view in [place].   Telephone follow up appointment with care management team member scheduled for: 8 weeks   Tomasa Blase, PharmD Clinical Pharmacist, [place]

## 2021-01-20 DIAGNOSIS — G4733 Obstructive sleep apnea (adult) (pediatric): Secondary | ICD-10-CM | POA: Diagnosis not present

## 2021-01-26 ENCOUNTER — Ambulatory Visit (INDEPENDENT_AMBULATORY_CARE_PROVIDER_SITE_OTHER): Payer: HMO | Admitting: Ophthalmology

## 2021-01-26 ENCOUNTER — Encounter (INDEPENDENT_AMBULATORY_CARE_PROVIDER_SITE_OTHER): Payer: Self-pay | Admitting: Ophthalmology

## 2021-01-26 ENCOUNTER — Other Ambulatory Visit: Payer: Self-pay

## 2021-01-26 DIAGNOSIS — H35072 Retinal telangiectasis, left eye: Secondary | ICD-10-CM

## 2021-01-26 DIAGNOSIS — E113311 Type 2 diabetes mellitus with moderate nonproliferative diabetic retinopathy with macular edema, right eye: Secondary | ICD-10-CM | POA: Diagnosis not present

## 2021-01-26 DIAGNOSIS — E113312 Type 2 diabetes mellitus with moderate nonproliferative diabetic retinopathy with macular edema, left eye: Secondary | ICD-10-CM | POA: Diagnosis not present

## 2021-01-26 MED ORDER — AFLIBERCEPT 2MG/0.05ML IZ SOLN FOR KALEIDOSCOPE
2.0000 mg | INTRAVITREAL | Status: AC | PRN
  Administered 2021-01-26: 2 mg via INTRAVITREAL

## 2021-01-26 NOTE — Assessment & Plan Note (Signed)
Treated and extend program was attempted today and at 12-week interval, CME has recurred with accompanying serous elevation of the macular region from CSME.  We will repeat injection Eylea today and follow-up OS in 8 to 10 weeks

## 2021-01-26 NOTE — Progress Notes (Signed)
01/26/2021     CHIEF COMPLAINT Patient presents for  Chief Complaint  Patient presents with   Retina Follow Up      HISTORY OF PRESENT ILLNESS: Whitney [name] is a 86 y.o. female who presents to the clinic today for:   HPI     Retina Follow Up   Patient presents with  Diabetic Retinopathy.  In right eye.  This started 12 weeks ago.  Severity is moderate.  Duration of 12 weeks.        Comments   12 week OS fu OU OCT. No injection scheduled today. Patient states vision is stable and unchanged since last visit. Denies any new floaters or FOL. Pt uses dorz-tim bid ou. LBS: Pt states "I don't know, I am waiting on a meter." A1C: 7.7      Last edited by Hurman Horn, MD on 01/26/2021 11:25 AM.      Referring physician: [name], MD [place],  [place] [id number]  HISTORICAL INFORMATION:   Selected notes from the MEDICAL RECORD NUMBER    Lab Results  Component Value Date   HGBA1C 7.7 (A) 10/22/2020     CURRENT MEDICATIONS: Current Outpatient Medications (Ophthalmic Drugs)  Medication Sig   dorzolamide-timolol (COSOPT) 22.3-6.8 MG/ML ophthalmic solution Instill 1 drop in each eye twice a day   DUREZOL 0.05 % EMUL    No current facility-administered medications for this visit. (Ophthalmic Drugs)   Current Outpatient Medications (Other)  Medication Sig   aspirin 81 MG tablet Take 1 tablet (81 mg total) by mouth daily.   blood glucose meter kit and supplies KIT Use to test blood sugar up to 3 times daily. DX E11.9   Calcium Carb-Cholecalciferol (CALCIUM-VITAMIN D) 500-200 MG-UNIT tablet Take 1 tablet by mouth 2 (two) times daily with a meal.   diclofenac Sodium (VOLTAREN) 1 % GEL Apply 4 g topically 4 (four) times daily.   empagliflozin (JARDIANCE) 25 MG TABS tablet Take 1 tablet (25 mg total) by mouth daily before breakfast.   glimepiride (AMARYL) 4 MG tablet TAKE 1 TABLET EVERY DAY BEFORE BREAKFAST   Lancet Devices (ACCU-CHEK  SOFTCLIX) lancets Use to test blood sugar up to 3 times a day. DX E11.9   lisinopril-hydrochlorothiazide (ZESTORETIC) 20-12.5 MG tablet Take 1 tablet by mouth daily.   metFORMIN (GLUCOPHAGE) 1000 MG tablet Take 1 tablet (1,000 mg total) by mouth 2 (two) times daily with a meal.   Multiple Vitamin (MULTI VITAMIN DAILY PO) Take one by mouth daily   ONETOUCH VERIO test strip Use to test blood sugar 3 times a day   simvastatin (ZOCOR) 20 MG tablet Take 1 tablet (20 mg total) by mouth at bedtime.   No current facility-administered medications for this visit. (Other)      REVIEW OF SYSTEMS:    ALLERGIES No Known Allergies  PAST MEDICAL HISTORY Past Medical History:  Diagnosis Date   Anxiety    Baker's cyst    Chest pain    Diabetes mellitus type 2, uncontrolled    Diabetes mellitus without complication (HCC)    Disc disease, degenerative, cervical    Diverticulosis of colon    DJD (degenerative joint disease) of knee    Facial pain, atypical    GERD (gastroesophageal reflux disease)    Glaucoma    Hypercholesteremia    Hypertension    Lumbar spondylosis    Obesity    Onychomycosis    Osteopenia    Past Surgical  History:  Procedure Laterality Date   right eye surgery  09/2010   retina in right eye   VAGINAL HYSTERECTOMY  2002   Dr. Kem Kays repair    FAMILY HISTORY Family History  Problem Relation Age of Onset   Diabetes Mother    Colon cancer Neg Hx    Stomach cancer Neg Hx     SOCIAL HISTORY Social History   Tobacco Use   Smoking status: Never   Smokeless tobacco: Never  Vaping Use   Vaping Use: Never used  Substance Use Topics   Alcohol use: No   Drug use: No         OPHTHALMIC EXAM:  Base Eye Exam     Visual Acuity (ETDRS)       Right Left   Dist cc 20/30 -2+2 20/40 -2   Dist ph cc NI 20/30 -1    Correction: Glasses         Tonometry (Tonopen, 10:45 AM)       Right Left   Pressure 13 13         Pupils       Shape React APD    Right Irregular Minimal None   Left Irregular Minimal None         Extraocular Movement       Right Left    Full Full         Neuro/Psych     Oriented x3: Yes   Mood/Affect: Normal         Dilation     Both eyes: 1.0% Mydriacyl, 2.5% Phenylephrine @ 10:45 AM           Slit Lamp and Fundus Exam     External Exam       Right Left   External Normal Normal         Slit Lamp Exam       Right Left   Lids/Lashes Normal Normal   Conjunctiva/Sclera White and quiet White and quiet   Cornea Clear Clear   Anterior Chamber Deep and quiet Deep and quiet   Iris Round and reactive Round and reactive   Lens Posterior chamber intraocular lens Posterior chamber intraocular lens   Anterior Vitreous Normal Normal         Fundus Exam       Right Left   Posterior Vitreous Normal Normal   Disc Normal Normal   C/D Ratio 0.55 0.7   Macula Microaneurysms,, trace clinical CSME Microaneurysms, increased macular thickening, Cystoid macular edema, Moderate clinically significant macular edema   Vessels NPDR, moderate NPDR, moderate   Periphery Normal Normal            IMAGING AND PROCEDURES  Imaging and Procedures for 01/26/21  OCT, Retina - OU - Both Eyes       Right Eye Quality was good. Scan locations included subfoveal. Central Foveal Thickness: 338. Progression has been stable. Findings include abnormal foveal contour, cystoid macular edema.   Left Eye Quality was good. Scan locations included subfoveal. Central Foveal Thickness: 376. Progression has been stable. Findings include abnormal foveal contour, cystoid macular edema.   Notes OS vastly improved macular edema initially and existed for 8 to 10-week with increased CSME now some 12 weeks after last injection, with small serous elevated macular detachment as well  OD, much vastly improved with only residual macular changes which look chronic likely from prior untreated MAC-TEL superimposed.  This may  be the best baseline we obtain.  But  nonetheless longer intervals of therapy now possible     Intravitreal Injection, Pharmacologic Agent - OS - Left Eye       Time Out 01/26/2021. 11:47 AM. Confirmed correct patient, procedure, site, and patient consented.   Anesthesia Topical anesthesia was used. Anesthetic medications included [place] 3.5%.   Procedure Preparation included Tobramycin 0.3%, 10% betadine to eyelids, 5% betadine to ocular surface, Ofloxacin . A 30 gauge needle was used.   Injection: 2 mg aflibercept 2 MG/0.05ML   Route: Intravitreal, Site: Left Eye   NDC: [id number], Lot: [id number], Waste: 0 mL   Post-op Post injection exam found visual acuity of at least counting fingers. The patient tolerated the procedure well. There were no complications. The patient received written and verbal post procedure care education. Post injection medications were not given.              ASSESSMENT/PLAN:  Type 2 macular telangiectasis, left Excellent compliance with CPAP and intact  Moderate nonproliferative diabetic retinopathy of right eye with macular edema (HCC) OD with minor CSME temporal superotemporal, follow-up as scheduled later in 5 weeks post recent injection  Moderate nonproliferative diabetic retinopathy of left eye with macular edema associated with type 2 diabetes mellitus ([place]) Treated and extend program was attempted today and at 12-week interval, CME has recurred with accompanying serous elevation of the macular region from CSME.  We will repeat injection Eylea today and follow-up OS in 8 to 10 weeks     ICD-10-CM   1. Moderate nonproliferative diabetic retinopathy of left eye with macular edema associated with type 2 diabetes mellitus (HCC)  G81.8563 Intravitreal Injection, Pharmacologic Agent - OS - Left Eye    aflibercept (EYLEA) SOLN 2 mg    2. Moderate nonproliferative diabetic retinopathy of right eye with macular edema associated with type 2  diabetes mellitus (HCC)  E11.3311 OCT, Retina - OU - Both Eyes    3. Type 2 macular telangiectasis, left  H35.072       1.  OD with overall improved CSME with superimposed MAC-TEL, CSME has improved post Avastin, follow-up OD as scheduled  2.  OS with treated extend program, has recurrent setting worsening of CSME 12-week follow-up interval post Eylea.  We will repeat Eylea today and examination next in 10 weeks 3.  Ophthalmic Meds Ordered this visit:  Meds ordered this encounter  Medications   aflibercept (EYLEA) SOLN 2 mg       Return in about 10 weeks (around 04/06/2021) for dilate, OS, EYLEA OCT.  There are no Patient Instructions on file for this visit.   Explained the diagnoses, plan, and follow up with the patient and they expressed understanding.  Patient expressed understanding of the importance of proper follow up care.   Clent Demark Dannell Raczkowski M.D. Diseases & Surgery of the Retina and Vitreous Retina & Diabetic [place] 01/26/21     Abbreviations: M myopia (nearsighted); A astigmatism; H hyperopia (farsighted); P presbyopia; Mrx spectacle prescription;  CTL contact lenses; OD right eye; OS left eye; OU both eyes  XT exotropia; ET esotropia; PEK punctate epithelial keratitis; PEE punctate epithelial erosions; DES dry eye syndrome; MGD meibomian gland dysfunction; ATs artificial tears; PFAT's preservative free artificial tears; [place] nuclear sclerotic cataract; PSC posterior subcapsular cataract; ERM epi-retinal membrane; PVD posterior vitreous detachment; RD retinal detachment; DM diabetes mellitus; DR diabetic retinopathy; NPDR non-proliferative diabetic retinopathy; PDR proliferative diabetic retinopathy; CSME clinically significant macular edema; DME diabetic macular edema; dbh dot blot hemorrhages; CWS cotton wool spot; POAG  primary open angle glaucoma; C/D cup-to-disc ratio; HVF humphrey visual field; GVF goldmann visual field; OCT optical coherence tomography; IOP  intraocular pressure; BRVO Branch retinal vein occlusion; CRVO central retinal vein occlusion; CRAO central retinal artery occlusion; BRAO branch retinal artery occlusion; RT retinal tear; SB scleral buckle; PPV pars plana vitrectomy; VH Vitreous hemorrhage; PRP panretinal laser photocoagulation; IVK intravitreal kenalog; VMT vitreomacular traction; MH Macular hole;  NVD neovascularization of the disc; NVE neovascularization elsewhere; AREDS age related eye disease study; ARMD age related macular degeneration; POAG primary open angle glaucoma; EBMD epithelial/anterior basement membrane dystrophy; ACIOL anterior chamber intraocular lens; IOL intraocular lens; PCIOL posterior chamber intraocular lens; Phaco/IOL phacoemulsification with intraocular lens placement; [place] photorefractive keratectomy; LASIK laser assisted in situ keratomileusis; HTN hypertension; DM diabetes mellitus; COPD chronic obstructive pulmonary disease

## 2021-01-26 NOTE — Assessment & Plan Note (Signed)
Excellent compliance with CPAP and intact

## 2021-01-26 NOTE — Assessment & Plan Note (Signed)
OD with minor CSME temporal superotemporal, follow-up as scheduled later in 5 weeks post recent injection

## 2021-01-28 ENCOUNTER — Encounter: Payer: Self-pay | Admitting: Internal Medicine

## 2021-02-03 ENCOUNTER — Telehealth: Payer: Self-pay | Admitting: Internal Medicine

## 2021-02-03 MED ORDER — DORZOLAMIDE HCL-TIMOLOL MAL 2-0.5 % OP SOLN: OPHTHALMIC | 1 refills | Status: DC

## 2021-02-03 MED ORDER — BLOOD GLUCOSE MONITOR KIT: PACK | 0 refills | Status: AC

## 2021-02-03 NOTE — Telephone Encounter (Signed)
Refills have been sent to the patient's pharmacy 

## 2021-02-03 NOTE — Telephone Encounter (Signed)
Patient called, stating [facility] has been trying to get in touch with [place] about the following medications:  dorzolamide-timolol (COSOPT) 22.3-6.8 MG/ML ophthalmic solution   blood glucose meter kit and supplies KIT [[id number]]    Patient states that her glucose meter is broken   [facility] ([place], [place] Phone:  [phone]  Fax:  [phone]     Last appt: 7.13.22 Next appt: 12.2.22

## 2021-02-09 DIAGNOSIS — I10 Essential (primary) hypertension: Secondary | ICD-10-CM

## 2021-02-09 DIAGNOSIS — E1169 Type 2 diabetes mellitus with other specified complication: Secondary | ICD-10-CM

## 2021-02-09 DIAGNOSIS — E785 Hyperlipidemia, unspecified: Secondary | ICD-10-CM | POA: Diagnosis not present

## 2021-02-10 ENCOUNTER — Encounter (INDEPENDENT_AMBULATORY_CARE_PROVIDER_SITE_OTHER): Payer: HMO | Admitting: Ophthalmology

## 2021-02-18 ENCOUNTER — Telehealth: Payer: Self-pay | Admitting: *Deleted

## 2021-02-18 NOTE — Chronic Care Management (AMB) (Signed)
  Chronic Care Management   Note  02/18/2021 Name: TANICA GAIGE MRN: [id number] DOB: 1934-09-27  Jinny Sanders is a 86 y.o. year old female who is a primary care patient of [name], MD. EVE REY is currently enrolled in care management services. An additional referral for RNCM  was placed.   Ms. Spainhower was given information about Chronic Care Management services today including:  CCM service includes personalized support from designated clinical staff supervised by her physician, including individualized plan of care and coordination with other care providers 24/7 contact phone numbers for assistance for urgent and routine care needs. Service will only be billed when office clinical staff spend 20 minutes or more in a month to coordinate care. Only one practitioner may furnish and bill the service in a calendar month. The patient may stop CCM services at any time (effective at the end of the month) by phone call to the office staff. The patient is responsible for co-pay (up to 20% after annual deductible is met) if co-pay is required by the individual health plan.   Patient agreed to services and verbal consent obtained.  Follow up plan: Telephone appointment with care management team member scheduled for:02/25/21  [place] [facility]  Direct Dial: [phone]

## 2021-02-24 ENCOUNTER — Other Ambulatory Visit: Payer: Self-pay

## 2021-02-24 ENCOUNTER — Ambulatory Visit (INDEPENDENT_AMBULATORY_CARE_PROVIDER_SITE_OTHER): Payer: HMO | Admitting: Ophthalmology

## 2021-02-24 ENCOUNTER — Encounter (INDEPENDENT_AMBULATORY_CARE_PROVIDER_SITE_OTHER): Payer: Self-pay | Admitting: Ophthalmology

## 2021-02-24 DIAGNOSIS — E113311 Type 2 diabetes mellitus with moderate nonproliferative diabetic retinopathy with macular edema, right eye: Secondary | ICD-10-CM

## 2021-02-24 DIAGNOSIS — H35072 Retinal telangiectasis, left eye: Secondary | ICD-10-CM

## 2021-02-24 DIAGNOSIS — H35071 Retinal telangiectasis, right eye: Secondary | ICD-10-CM

## 2021-02-24 MED ORDER — BEVACIZUMAB 2.5 MG/0.1ML IZ SOSY
2.5000 mg | PREFILLED_SYRINGE | INTRAVITREAL | Status: AC | PRN
Start: 2021-02-24 — End: 2021-02-24
  Administered 2021-02-24: 2.5 mg via INTRAVITREAL

## 2021-02-24 NOTE — Progress Notes (Signed)
02/24/2021     CHIEF COMPLAINT Patient presents for  Chief Complaint  Patient presents with   Retina Follow Up      HISTORY OF PRESENT ILLNESS: Whitney [name] is a 86 y.o. female who presents to the clinic today for:   HPI     Retina Follow Up   Patient presents with  Diabetic Retinopathy.  In right eye.  This started 9 weeks ago.  Severity is moderate.  Duration of 9 weeks.        Comments   9 week fu OD oct avastin OD. Patient states vision is stable and unchanged since last visit. Denies any new floaters or FOL. Pt uses dorz-tim BID OU.      Last edited by Laurin Coder on 02/24/2021 10:08 AM.      Referring physician: [name], MD [place],  [place] [id number]  HISTORICAL INFORMATION:   Selected notes from the MEDICAL RECORD NUMBER    Lab Results  Component Value Date   HGBA1C 7.7 (A) 10/22/2020     CURRENT MEDICATIONS: Current Outpatient Medications (Ophthalmic Drugs)  Medication Sig   dorzolamide-timolol (COSOPT) 22.3-6.8 MG/ML ophthalmic solution Instill 1 drop in each eye twice a day   DUREZOL 0.05 % EMUL    No current facility-administered medications for this visit. (Ophthalmic Drugs)   Current Outpatient Medications (Other)  Medication Sig   aspirin 81 MG tablet Take 1 tablet (81 mg total) by mouth daily.   blood glucose meter kit and supplies KIT Use to test blood sugar up to 3 times daily. DX E11.9   Calcium Carb-Cholecalciferol (CALCIUM-VITAMIN D) 500-200 MG-UNIT tablet Take 1 tablet by mouth 2 (two) times daily with a meal.   diclofenac Sodium (VOLTAREN) 1 % GEL Apply 4 g topically 4 (four) times daily.   empagliflozin (JARDIANCE) 25 MG TABS tablet Take 1 tablet (25 mg total) by mouth daily before breakfast.   glimepiride (AMARYL) 4 MG tablet TAKE 1 TABLET EVERY DAY BEFORE BREAKFAST   Lancet Devices (ACCU-CHEK SOFTCLIX) lancets Use to test blood sugar up to 3 times a day. DX E11.9    lisinopril-hydrochlorothiazide (ZESTORETIC) 20-12.5 MG tablet Take 1 tablet by mouth daily.   metFORMIN (GLUCOPHAGE) 1000 MG tablet Take 1 tablet (1,000 mg total) by mouth 2 (two) times daily with a meal.   Multiple Vitamin (MULTI VITAMIN DAILY PO) Take one by mouth daily   ONETOUCH VERIO test strip Use to test blood sugar 3 times a day   simvastatin (ZOCOR) 20 MG tablet Take 1 tablet (20 mg total) by mouth at bedtime.   No current facility-administered medications for this visit. (Other)      REVIEW OF SYSTEMS:    ALLERGIES No Known Allergies  PAST MEDICAL HISTORY Past Medical History:  Diagnosis Date   Anxiety    Baker's cyst    Chest pain    Diabetes mellitus type 2, uncontrolled    Diabetes mellitus without complication (HCC)    Disc disease, degenerative, cervical    Diverticulosis of colon    DJD (degenerative joint disease) of knee    Facial pain, atypical    GERD (gastroesophageal reflux disease)    Glaucoma    Hypercholesteremia    Hypertension    Lumbar spondylosis    Obesity    Onychomycosis    Osteopenia    Past Surgical History:  Procedure Laterality Date   right eye surgery  09/2010   retina in right eye  VAGINAL HYSTERECTOMY  2002   Dr. Kem Kays repair    FAMILY HISTORY Family History  Problem Relation Age of Onset   Diabetes Mother    Colon cancer Neg Hx    Stomach cancer Neg Hx     SOCIAL HISTORY Social History   Tobacco Use   Smoking status: Never   Smokeless tobacco: Never  Vaping Use   Vaping Use: Never used  Substance Use Topics   Alcohol use: No   Drug use: No         OPHTHALMIC EXAM:  Base Eye Exam     Visual Acuity (ETDRS)       Right Left   Dist cc 20/40 -2 20/30 -1   Dist ph cc NI     Correction: Glasses         Tonometry (Tonopen, 10:12 AM)       Right Left   Pressure 9 10         Pupils       Shape React APD   Right Irregular Minimal None   Left Irregular Minimal None          Extraocular Movement       Right Left    Full Full         Neuro/Psych     Oriented x3: Yes   Mood/Affect: Normal         Dilation     Right eye: 1.0% Mydriacyl, 2.5% Phenylephrine @ 10:12 AM           Slit Lamp and Fundus Exam     External Exam       Right Left   External Normal Normal         Slit Lamp Exam       Right Left   Lids/Lashes Normal Normal   Conjunctiva/Sclera White and quiet White and quiet   Cornea Clear Clear   Anterior Chamber Deep and quiet Deep and quiet   Iris Round and reactive Round and reactive   Lens Posterior chamber intraocular lens Posterior chamber intraocular lens   Anterior Vitreous Normal Normal         Fundus Exam       Right Left   Posterior Vitreous Normal    Disc Normal    C/D Ratio 0.55    Macula Microaneurysms,, trace clinical CSME    Vessels NPDR, moderate    Periphery Normal             IMAGING AND PROCEDURES  Imaging and Procedures for 02/24/21  Intravitreal Injection, Pharmacologic Agent - OD - Right Eye       Time Out 02/24/2021. 11:10 AM. Confirmed correct patient, procedure, site, and patient consented.   Anesthesia Topical anesthesia was used. Anesthetic medications included Lidocaine 4%.   Procedure Preparation included 10% betadine to eyelids, 5% betadine to ocular surface, Ofloxacin . A 30 gauge needle was used.   Injection: 2.5 mg bevacizumab 2.5 MG/0.1ML   Route: Intravitreal, Site: Right Eye   NDC: [id number], Lot: [id number]   Post-op Post injection exam found visual acuity of at least counting fingers. The patient tolerated the procedure well. There were no complications. The patient received written and verbal post procedure care education. Post injection medications included ocuflox.      OCT, Retina - OU - Both Eyes       Right Eye Quality was good. Scan locations included subfoveal. Central Foveal Thickness: 556. Progression has worsened. Findings include abnormal  foveal contour,  cystoid macular edema.   Left Eye Quality was good. Scan locations included subfoveal. Central Foveal Thickness: 228. Progression has improved. Findings include abnormal foveal contour, cystoid macular edema.   Notes OS vastly improved macular edema initially and existed for -week with increased CSME now some 12 weeks after last injection, with small serous elevated macular detachment as well  OD, increased CSME thus worse at 9-week only residual macular changes which look chronic likely from prior untreated MAC-TEL superimposed.  This may be the best baseline we obtain.  But nonetheless longer intervals of therapy now possible  Will repeat injection OD today and examination OD next and 6 weeks             ASSESSMENT/PLAN:  Type 2 macular telangiectasis, left Patient continues with excellent compliance with CPAP  Retinal telangiectasia of right eye Patient continues with excellent compliance with CPAP, DME did recur however with CSME at 9-week interval post most recent injection OD  Moderate nonproliferative diabetic retinopathy of right eye with macular edema (HCC) OD, with recurrence of CSME, DME at 9-week interval.  While compliant on CPAP use for prevention of macular ischemia from untreated sleep apnea  Will repeat injection today and shorten interval next to 6 weeks  Dilate OU next      ICD-10-CM   1. Moderate nonproliferative diabetic retinopathy of right eye with macular edema associated with type 2 diabetes mellitus (HCC)  W23.7628 Intravitreal Injection, Pharmacologic Agent - OD - Right Eye    OCT, Retina - OU - Both Eyes    bevacizumab (AVASTIN) SOSY 2.5 mg    2. Type 2 macular telangiectasis, left  H35.072     3. Retinal telangiectasia of right eye  H35.071       1.  At 9-week interval DME recurred OD, will need repeat injection Avastin today and examination OD in 6 weeks  2.  Follow-up OS as scheduled also in 6 weeks patient will like  dilation OU next in 6 weeks and consideration of treatment OU if necessary  3.  Ophthalmic Meds Ordered this visit:  Meds ordered this encounter  Medications   bevacizumab (AVASTIN) SOSY 2.5 mg        Return in about 6 weeks (around 04/07/2021) for RV 67 weeks, DILATE OU, AVASTIN OCT, OD, OS.  There are no Patient Instructions on file for this visit.   Explained the diagnoses, plan, and follow up with the patient and they expressed understanding.  Patient expressed understanding of the importance of proper follow up care.   Clent Demark  M.D. Diseases & Surgery of the Retina and Vitreous Retina & Diabetic [place] 02/24/21     Abbreviations: M myopia (nearsighted); A astigmatism; H hyperopia (farsighted); P presbyopia; Mrx spectacle prescription;  CTL contact lenses; OD right eye; OS left eye; OU both eyes  XT exotropia; ET esotropia; PEK punctate epithelial keratitis; PEE punctate epithelial erosions; DES dry eye syndrome; MGD meibomian gland dysfunction; ATs artificial tears; PFAT's preservative free artificial tears; [place] nuclear sclerotic cataract; PSC posterior subcapsular cataract; ERM epi-retinal membrane; PVD posterior vitreous detachment; RD retinal detachment; DM diabetes mellitus; DR diabetic retinopathy; NPDR non-proliferative diabetic retinopathy; PDR proliferative diabetic retinopathy; CSME clinically significant macular edema; DME diabetic macular edema; dbh dot blot hemorrhages; CWS cotton wool spot; POAG primary open angle glaucoma; C/D cup-to-disc ratio; HVF humphrey visual field; GVF goldmann visual field; OCT optical coherence tomography; IOP intraocular pressure; BRVO Branch retinal vein occlusion; CRVO central retinal vein occlusion; CRAO central retinal artery  occlusion; BRAO branch retinal artery occlusion; RT retinal tear; SB scleral buckle; PPV pars plana vitrectomy; VH Vitreous hemorrhage; PRP panretinal laser photocoagulation; IVK intravitreal kenalog; VMT  vitreomacular traction; MH Macular hole;  NVD neovascularization of the disc; NVE neovascularization elsewhere; AREDS age related eye disease study; ARMD age related macular degeneration; POAG primary open angle glaucoma; EBMD epithelial/anterior basement membrane dystrophy; ACIOL anterior chamber intraocular lens; IOL intraocular lens; PCIOL posterior chamber intraocular lens; Phaco/IOL phacoemulsification with intraocular lens placement; [place] photorefractive keratectomy; LASIK laser assisted in situ keratomileusis; HTN hypertension; DM diabetes mellitus; COPD chronic obstructive pulmonary disease

## 2021-02-24 NOTE — Assessment & Plan Note (Signed)
OD, with recurrence of CSME, DME at 9-week interval.  While compliant on CPAP use for prevention of macular ischemia from untreated sleep apnea  Will repeat injection today and shorten interval next to 6 weeks  Dilate OU next

## 2021-02-24 NOTE — Assessment & Plan Note (Signed)
Patient continues with excellent compliance with CPAP

## 2021-02-24 NOTE — Assessment & Plan Note (Signed)
Patient continues with excellent compliance with CPAP, DME did recur however with CSME at 9-week interval post most recent injection OD

## 2021-02-25 ENCOUNTER — Ambulatory Visit (INDEPENDENT_AMBULATORY_CARE_PROVIDER_SITE_OTHER): Payer: HMO | Admitting: *Deleted

## 2021-02-25 DIAGNOSIS — E1169 Type 2 diabetes mellitus with other specified complication: Secondary | ICD-10-CM

## 2021-02-25 DIAGNOSIS — E785 Hyperlipidemia, unspecified: Secondary | ICD-10-CM

## 2021-02-26 NOTE — Chronic Care Management (AMB) (Addendum)
Chronic Care Management   CCM RN Visit Note  02/26/2021 Name: Whitney [name] MRN: [id number] DOB: [birth year]  Subjective: Whitney [name] is a 86 y.o. year old female who is a primary care patient of [name], MD. The care management team was consulted for assistance with disease management and care coordination needs.    Engaged with patient by telephone for initial visit in response to provider referral for case management and/or care coordination services.   Consent to Services:  The patient was given information about Chronic Care Management services, agreed to services, and gave verbal consent 02/18/21 prior to initiation of services.  Please see initial visit note for detailed documentation.  Patient agreed to services and verbal consent obtained.   Assessment: Review of patient past medical history, allergies, medications, health status, including review of consultants reports, laboratory and other test data, was performed as part of comprehensive evaluation and provision of chronic care management services.   SDOH (Social Determinants of Health) assessments and interventions performed:  SDOH Interventions    Flowsheet Row Most Recent Value  SDOH Interventions   Food Insecurity Interventions Intervention Not Indicated  Housing Interventions Intervention Not Indicated  [Single family home- lived same home 58 years]  Transportation Interventions Intervention Not Indicated  [Patient drives short distances,  son assists with providing transportation to provider appointments]      CCM Care Plan No Known Allergies  Outpatient Encounter Medications as of 02/25/2021  Medication Sig Note   aspirin 81 MG tablet Take 1 tablet (81 mg total) by mouth daily.    blood glucose meter kit and supplies KIT Use to test blood sugar up to 3 times daily. DX E11.9    Calcium Carb-Cholecalciferol (CALCIUM-VITAMIN D) 500-200 MG-UNIT tablet Take 1 tablet by mouth 2 (two) times daily  with a meal.    diclofenac Sodium (VOLTAREN) 1 % GEL Apply 4 g topically 4 (four) times daily.    dorzolamide-timolol (COSOPT) 22.3-6.8 MG/ML ophthalmic solution Instill 1 drop in each eye twice a day    empagliflozin (JARDIANCE) 25 MG TABS tablet Take 1 tablet (25 mg total) by mouth daily before breakfast.    glimepiride (AMARYL) 4 MG tablet TAKE 1 TABLET EVERY DAY BEFORE BREAKFAST    Lancet Devices (ACCU-CHEK SOFTCLIX) lancets Use to test blood sugar up to 3 times a day. DX E11.9    lisinopril-hydrochlorothiazide (ZESTORETIC) 20-12.5 MG tablet Take 1 tablet by mouth daily.    metFORMIN (GLUCOPHAGE) 1000 MG tablet Take 1 tablet (1,000 mg total) by mouth 2 (two) times daily with a meal.    Multiple Vitamin (MULTI VITAMIN DAILY PO) Take one by mouth daily 06/19/2013: Received from: External Pharmacy   [facility] VERIO test strip Use to test blood sugar 3 times a day    simvastatin (ZOCOR) 20 MG tablet Take 1 tablet (20 mg total) by mouth at bedtime.    DUREZOL 0.05 % EMUL  (Patient not taking: Reported on 02/25/2021) 03/17/2015: Received from: External Pharmacy   No facility-administered encounter medications on file as of 02/25/2021.   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 associated with type 2 diabetes mellitus ([place]) 08/13/2019   Moderate nonproliferative diabetic retinopathy of right eye with macular edema ([facility]) 08/13/2019   Type 2 macular telangiectasis, left 08/13/2019   Retinal telangiectasia of right eye 08/13/2019   OSA on CPAP 07/20/2017   Venous insufficiency  04/26/2017   Sensorineural hearing loss (SNHL), bilateral 12/08/2016   Routine general medical examination at a health care facility 05/25/2016   Type 2 diabetes mellitus with hyperlipidemia ([place]) 02/23/2012   Obesity 08/23/2011   GLAUCOMA 08/24/2009   Left shoulder pain 02/19/2008   Hyperlipidemia  associated with type 2 diabetes mellitus ([place]) 12/22/2006   Essential hypertension 12/22/2006   GERD 12/22/2006   DISC DISEASE, CERVICAL 12/22/2006   Osteopenia 12/22/2006   Other chest pain 12/22/2006   Conditions to be addressed/monitored:  HLD and DMII  Care Plan : RN Care Manager Plan of Care  Updates made by Knox Royalty, RN since 02/26/2021 12:00 AM     Problem: Chronic Disease Management Needs   Priority: High     Long-Range Goal: Development of plan of care for long term chronic disease management   Start Date: 02/25/2021  Expected End Date: 02/25/2022  Priority: High  Note:   Current Barriers:  Chronic Disease Management support and education needs related to HLD and DMII Hard of hearing- lost hearing aid for (L) ear- verbalizes plans to replace in coming months Retinal issues- sees specialist for eye injections; occasional issues with reading; able to drive short distances; family assists with driving long distances  RNCM Clinical Goal(s):  Patient will demonstrate Ongoing health management independence DMII; HLD  through collaboration with RN Care manager, provider, and care team.   Interventions: 1:1 collaboration with primary care provider regarding development and update of comprehensive plan of care as evidenced by provider attestation and co-signature Inter-disciplinary care team collaboration (see longitudinal plan of care) Evaluation of current treatment plan related to  self management and patient's adherence to plan as established by provider  Hyperlipidemia Interventions:  GOAL STATUS: NEW Goal Medication review performed; medication list updated in electronic medical record; patient verbalizes good general understandinng of purpose, dosing, and scheduling of medications- independently self manages Provider established cholesterol goals reviewed; Counseled on importance of regular laboratory monitoring as prescribed; Provided HLD educational  materials; Reviewed role and benefits of statin for ASCVD risk reduction; Reviewed importance of limiting foods high in cholesterol; Reviewed exercise goals and target of 150 minutes per week; Assessed social determinant of health barriers;  Confirmed patient obtained flu vaccine for 2022-23 flu/ winter season on 01/11/21 Confirmed patient has plans to obtain COVID vaccine booster "soon" Pain assessment completed- denies chronic pain; occasional (R) knee pain well managed with Voltaren gel- denies pain today Confirmed uses CPAP unit at night as prescribed; no issues  Diabetes Interventions:  GOAL STATUS: NEW Goal Assessed patient's understanding of A1c goal: <7% Provided education to patient about basic DM disease process; Reviewed medications with patient and discussed importance of medication adherence; Counseled on importance of regular laboratory monitoring as prescribed; Discussed plans with patient for ongoing care management follow up and provided patient with direct contact information for care management team; Review of patient status, including review of consultants reports, relevant laboratory and other test results, and medications completed; Confirmed patient monitors/ records blood sugars at home BID-TID: reports consistent blood sugar values at home between 130-188: reports consistent fasting blood sugars between 130-140 Provided education around general goals for blood sugar values at home: between 130-140 fasting; between 160-170 post-prandial Assessed patient's understanding of significance of A1-C lab monitoring/ provided education around same; reviewed individual A1-C trends and correlation of A1-C value to blood sugars at home Confirmed patient tries to follow heart healthy, low sodium, carbohydrate modified, low sugar diet- positive reinforcement provided with encouragement to  continue efforts; provided education around simple ways to incorporate ongoing efforts to follow  diet Confirmed patient attends annual eye exam/ retinal specialist visits for eye injection: up to date on both per patient report- positive reinforcement provided with encouragement to continue efforts  Lab Results  Component Value Date   HGBA1C 7.7 (A) 10/22/2020  Patient Goals/Self-Care Activities: As evidenced by review of EHR, collaboration with care team, and patient reporting during CCM RN CM outreach, Patient Mela will: Self administer medications as prescribed Attend all scheduled provider appointments Call pharmacy for medication refills Call provider office for new concerns or questions Continue to check fasting (first thing in the morning, before eating) blood sugars at home: your goal for fasting blood sugars are between 130-140 Continue to occassionally check after-eating blood sugars 2 hours after a meal: your goal for after eating blood sugars are between 160-170 Continue to follow heart healthy, low salt, low cholesterol, carbohydrate-modified, low sugar diet Review enclosed educational material   Follow Up Plan:   Telephone follow up appointment with care management team member scheduled for:  Friday, March 20, 2021 at 10:15 am The patient has been provided with contact information for the care management team and has been advised to call with any health related questions or concerns     Plan: The patient has been provided with contact information for the care management team and has been advised to call with any health related questions or concerns  [name], RN, BSN, [place] [phone]: direct office [phone]: mobile

## 2021-02-26 NOTE — Patient Instructions (Signed)
Visit Information   Whitney [name], it was nice talking with you today.   I look forward to talking to you again for a telephone update on Friday, March 20, 2021 at 10:15 am- please be listening out for my call that day.  I will call as close to 10:15 am as possible.   If you need to cancel or re-schedule our telephone visit, please call [phone] and one of our care guides will be happy to assist you.   I look forward to hearing about your progress.   Please don't hesitate to contact me if I can be of assistance to you before our next scheduled telephone appointment.   [name], RN, BSN, [facility] RN Care Coordination- [place] [phone]: direct office [phone]: mobile   Patient Self-Care Activities: Patient [name] will: Self administer medications as prescribed Attend all scheduled provider appointments Call pharmacy for medication refills Call provider office for new concerns or questions Continue to check fasting (first thing in the morning, before eating) blood sugars at home: your goal for fasting blood sugars are between 130-140 Continue to occassionally check after-eating blood sugars 2 hours after a meal: your goal for after eating blood sugars are between 160-170 Continue to follow heart healthy, low salt, low cholesterol, carbohydrate-modified, low sugar diet Review enclosed educational material   Cholesterol Content in Foods Cholesterol is a waxy, fat-like substance that helps to carry fat in the blood. The body needs cholesterol in small amounts, but too much cholesterol can cause damage to the arteries and heart. What foods have cholesterol? Cholesterol is found in animal-based foods, such as meat, seafood, and dairy. Generally, low-fat dairy and lean meats have less cholesterol than full-fat dairy and fatty meats. The milligrams of cholesterol per serving (mg per serving) of common cholesterol-containing foods are listed  below. Meats and other proteins Egg -- one large whole egg has 186 mg. Veal shank -- 4 oz (113 g) has 141 mg. Lean ground [place] (93% lean) -- 4 oz (113 g) has 118 mg. Fat-trimmed lamb loin -- 4 oz (113 g) has 106 mg. Lean ground beef (90% lean) -- 4 oz (113 g) has 100 mg. Lobster -- 3.5 oz (99 g) has 90 mg. Pork loin chops -- 4 oz (113 g) has 86 mg. Canned salmon -- 3.5 oz (99 g) has 83 mg. Fat-trimmed beef top loin -- 4 oz (113 g) has 78 mg. Frankfurter -- 1 frank (3.5 oz or 99 g) has 77 mg. Crab -- 3.5 oz (99 g) has 71 mg. Roasted chicken without skin, white meat -- 4 oz (113 g) has 66 mg. Light bologna -- 2 oz (57 g) has 45 mg. Deli-cut [place] -- 2 oz (57 g) has 31 mg. Canned tuna -- 3.5 oz (99 g) has 31 mg. Berniece Salines -- 1 oz (28 g) has 29 mg. Oysters and mussels (raw) -- 3.5 oz (99 g) has 25 mg. Mackerel -- 1 oz (28 g) has 22 mg. Trout -- 1 oz (28 g) has 20 mg. Pork sausage -- 1 link (1 oz or 28 g) has 17 mg. Salmon -- 1 oz (28 g) has 16 mg. Tilapia -- 1 oz (28 g) has 14 mg. Dairy Soft-serve ice cream --  cup (4 oz or 86 g) has 103 mg. Whole-milk yogurt -- 1 cup (8 oz or 245 g) has 29 mg. Cheddar cheese -- 1 oz (28 g) has 28 mg. American cheese -- 1 oz (28 g)  has 28 mg. Whole milk -- 1 cup (8 oz or 250 mL) has 23 mg. 2% milk -- 1 cup (8 oz or 250 mL) has 18 mg. Cream cheese -- 1 tablespoon (Tbsp) (14.5 g) has 15 mg. Cottage cheese --  cup (4 oz or 113 g) has 14 mg. Low-fat (1%) milk -- 1 cup (8 oz or 250 mL) has 10 mg. Sour cream -- 1 Tbsp (12 g) has 8.5 mg. Low-fat yogurt -- 1 cup (8 oz or 245 g) has 8 mg. Nonfat Greek yogurt -- 1 cup (8 oz or 228 g) has 7 mg. Half-and-half cream -- 1 Tbsp (15 mL) has 5 mg. Fats and oils Cod liver oil -- 1 tablespoon (Tbsp) (13.6 g) has 82 mg. Butter -- 1 Tbsp (14 g) has 15 mg. Lard -- 1 Tbsp (12.8 g) has 14 mg. Bacon grease -- 1 Tbsp (12.9 g) has 14 mg. Mayonnaise -- 1 Tbsp (13.8 g) has 5-10 mg. Margarine -- 1 Tbsp (14 g) has 3-10  mg. The items listed above may not be a complete list of foods with cholesterol. Exact amounts of cholesterol in these foods may vary depending on specific ingredients and brands. Contact a dietitian for more information. What foods do not have cholesterol? Most plant-based foods do not have cholesterol unless you combine them with a food that has cholesterol. Foods without cholesterol include: Grains and cereals. Vegetables. Fruits. Vegetable oils, such as olive, canola, and sunflower oil. Legumes, such as peas, beans, and lentils. Nuts and seeds. Egg whites. The items listed above may not be a complete list of foods that do not have cholesterol. Contact a dietitian for more information. Summary The body needs cholesterol in small amounts, but too much cholesterol can cause damage to the arteries and heart. Cholesterol is found in animal-based foods, such as meat, seafood, and dairy. Generally, low-fat dairy and lean meats have less cholesterol than full-fat dairy and fatty meats. This information is not intended to replace advice given to you by your health care provider. Make sure you discuss any questions you have with your health care provider. Document Revised: 08/08/2020 Document Reviewed: 08/08/2020 Elsevier Patient Education  2022 [place].  PATIENT GOALS/PLAN OF CARE:  Care Plan : RN Care Manager Plan of Care  Updates made by Knox Royalty, RN since 02/26/2021 12:00 AM     Problem: Chronic Disease Management Needs   Priority: High     Long-Range Goal: Development of plan of care for long term chronic disease management   Start Date: 02/25/2021  Expected End Date: 02/25/2022  Priority: High  Note:   Current Barriers:  Chronic Disease Management support and education needs related to HLD and DMII Hard of hearing- lost hearing aid for (L) ear- verbalizes plans to replace in coming months Retinal issues- sees specialist for eye injections; occasional issues with  reading; able to drive short distances; family assists with driving long distances  RNCM Clinical Goal(s):  Patient will demonstrate Ongoing health management independence DMII; HLD  through collaboration with RN Care manager, provider, and care team.   Interventions: 1:1 collaboration with primary care provider regarding development and update of comprehensive plan of care as evidenced by provider attestation and co-signature Inter-disciplinary care team collaboration (see longitudinal plan of care) Evaluation of current treatment plan related to  self management and patient's adherence to plan as established by provider  Hyperlipidemia Interventions: Medication review performed; medication list updated in electronic medical record; patient verbalizes good general understandinng  of purpose, dosing, and scheduling of medications- independently self manages Provider established cholesterol goals reviewed; Counseled on importance of regular laboratory monitoring as prescribed; Provided HLD educational materials; Reviewed role and benefits of statin for ASCVD risk reduction; Reviewed importance of limiting foods high in cholesterol; Reviewed exercise goals and target of 150 minutes per week; Assessed social determinant of health barriers;  Confirmed patient obtained flu vaccine for 2022-23 flu/ winter season on 01/11/21 Confirmed patient has plans to obtain COVID vaccine booster "soon" Pain assessment completed- denies chronic pain; occasional (R) knee pain well managed with Voltaren gel- denies pain today Confirmed uses CPAP unit at night as prescribed; no issues  Diabetes Interventions: Assessed patient's understanding of A1c goal: <7% Provided education to patient about basic DM disease process; Reviewed medications with patient and discussed importance of medication adherence; Counseled on importance of regular laboratory monitoring as prescribed; Discussed plans with patient for ongoing  care management follow up and provided patient with direct contact information for care management team; Review of patient status, including review of consultants reports, relevant laboratory and other test results, and medications completed; Confirmed patient monitors/ records blood sugars at home BID-TID: reports consistent blood sugar values at home between 130-188: reports consistent fasting blood sugars between 130-140 Provided education around general goals for blood sugar values at home: between 130-140 fasting; between 160-170 post-prandial Assessed patient's understanding of significance of A1-C lab monitoring/ provided education around same; reviewed individual A1-C trends and correlation of A1-C value to blood sugars at home Confirmed patient tries to follow heart healthy, low sodium, carbohydrate modified, low sugar diet- positive reinforcement provided with encouragement to continue efforts; provided education around simple ways to incorporate ongoing efforts to follow diet Confirmed patient attends annual eye exam/ retinal specialist visits for eye injection: up to date on both per patient report- positive reinforcement provided with encouragement to continue efforts  Lab Results  Component Value Date   HGBA1C 7.7 (A) 10/22/2020  Patient Goals/Self-Care Activities: As evidenced by review of EHR, collaboration with care team, and patient reporting during CCM RN CM outreach, Patient Thy will: Self administer medications as prescribed Attend all scheduled provider appointments Call pharmacy for medication refills Call provider office for new concerns or questions Continue to check fasting (first thing in the morning, before eating) blood sugars at home: your goal for fasting blood sugars are between 130-140 Continue to occassionally check after-eating blood sugars 2 hours after a meal: your goal for after eating blood sugars are between 160-170 Continue to follow heart healthy, low  salt, low cholesterol, carbohydrate-modified, low sugar diet Review enclosed educational material   Follow Up Plan:   Telephone follow up appointment with care management team member scheduled for:  Friday, March 20, 2021 at 10:15 am The patient has been provided with contact information for the care management team and has been advised to call with any health related questions or concerns    Consent to CCM Services: Ms. Cassel was given information about Chronic Care Management services 02/18/21 including:  CCM service includes personalized support from designated clinical staff supervised by her physician, including individualized plan of care and coordination with other care providers 24/7 contact phone numbers for assistance for urgent and routine care needs. Service will only be billed when office clinical staff spend 20 minutes or more in a month to coordinate care. Only one practitioner may furnish and bill the service in a calendar month. The patient may stop CCM services at any time (effective at the  end of the month) by phone call to the office staff. The patient will be responsible for cost sharing (co-pay) of up to 20% of the service fee (after annual deductible is met).  Patient agreed to services and verbal consent obtained.   The patient verbalized understanding of instructions, educational materials, and care plan provided today and agreed to receive a mailed copy of patient instructions, educational materials, and care plan Telephone follow up appointment with care management team member scheduled for:  Friday, March 20, 2021 at 10:15 am The patient has been provided with contact information for the care management team and has been advised to call with any health related questions or concerns  [name], RN, BSN, [place] [phone]: direct office [phone]: mobile

## 2021-03-02 ENCOUNTER — Other Ambulatory Visit: Payer: Self-pay | Admitting: Internal Medicine

## 2021-03-02 ENCOUNTER — Telehealth: Payer: Self-pay | Admitting: Internal Medicine

## 2021-03-02 DIAGNOSIS — Z1231 Encounter for screening mammogram for malignant neoplasm of breast: Secondary | ICD-10-CM

## 2021-03-02 MED ORDER — LISINOPRIL-HYDROCHLOROTHIAZIDE 20-12.5 MG PO TABS: 1.0000 | ORAL_TABLET | Freq: Every day | ORAL | 1 refills | Status: DC

## 2021-03-02 MED ORDER — METFORMIN HCL 1000 MG PO TABS: 1000.0000 mg | ORAL_TABLET | Freq: Two times a day (BID) | ORAL | 1 refills | Status: DC

## 2021-03-02 NOTE — Telephone Encounter (Signed)
1.Medication Requested: lisinopril-hydrochlorothiazide (ZESTORETIC) 20-12.5 MG tablet  metFORMIN (GLUCOPHAGE) 1000 MG tablet   2. Pharmacy (Name, [place], [place]): [facility] - [place], [address] [place]  Phone:  [phone] Fax:  [phone]   3. On Med List: yes  4. Last Visit with PCP: 07.13.22  5. Next visit date with PCP: n/a   Agent: Please be advised that RX refills may take up to 3 business days. We ask that you follow-up with your pharmacy.

## 2021-03-02 NOTE — Telephone Encounter (Signed)
Refill has been sent to the patient's pharmacy.  

## 2021-03-11 DIAGNOSIS — E1169 Type 2 diabetes mellitus with other specified complication: Secondary | ICD-10-CM

## 2021-03-11 DIAGNOSIS — E785 Hyperlipidemia, unspecified: Secondary | ICD-10-CM

## 2021-03-11 DIAGNOSIS — Z7984 Long term (current) use of oral hypoglycemic drugs: Secondary | ICD-10-CM | POA: Diagnosis not present

## 2021-03-13 ENCOUNTER — Ambulatory Visit (INDEPENDENT_AMBULATORY_CARE_PROVIDER_SITE_OTHER): Payer: HMO

## 2021-03-13 ENCOUNTER — Other Ambulatory Visit: Payer: Self-pay

## 2021-03-13 DIAGNOSIS — E1169 Type 2 diabetes mellitus with other specified complication: Secondary | ICD-10-CM

## 2021-03-13 NOTE — Progress Notes (Signed)
Chronic Care Management Pharmacy Note  03/13/2021 Name:  Whitney [name] MRN:  [id number] DOB:  Jan 10, 1935  Summary: - Patient reports that BP this AM 139/66 pulse 59 - BP has been averaging in the 130/60-70's, BG has improved, is now averaging 130-140's -Patient notes that she has been more cognizant of her diet and portion size, which she attributes top helping her better control BP and BG   Recommendations/Changes made from today's visit: -Recommending for patient to continue with dietary changes to help better control BP and BG - patient to continue monitoring as she is - will reach out should BP >140/90 or BG >150  Subjective: Whitney [name] is an 86 y.o. year old female who is a primary patient of [name], MD.  The CCM team was consulted for assistance with disease management and care coordination needs.    Engaged with patient by telephone for follow up visit in response to provider referral for pharmacy case management and/or care coordination services.   Consent to Services:  The patient was given information about Chronic Care Management services, agreed to services, and gave verbal consent prior to initiation of services.  Please see initial visit note for detailed documentation.   Patient Care Team: [name], MD as PCP - General (Internal Medicine) Noralee Space, MD as Consulting Physician (Pulmonary Disease) Gean Birchwood, DPM as Consulting Physician (Podiatry) Charlton Haws, [facility] as Pharmacist (Pharmacist) Knox Royalty, RN as Case Manager  Pt has lived here her whole life, she has 4 children, 4 grandchildren, 3 great-grandchildren. Patient lives alone; she takes care of house work.  Recent office visits: 10/22/20 Dr Sharlet Salina - PCP f/u - A1c 7.7% - at goal for patient, declined to start new medication - referred to nutrition  Recent consult visits: 02/24/2021 - Dr. Zadie Rhine - Ophthalmology - given avastin injection - follow up  in 6 weeks  01/26/2021 - Dr. Zadie Rhine - Ophthalmology - eylea injection given in OS - - 10 week follow up   Hospital visits: None in previous 6 months   Objective:  Lab Results  Component Value Date   CREATININE 0.81 03/17/2020   BUN 11 03/17/2020   GFR 67.28 03/17/2020   GFRNONAA 87.17 02/16/2010   GFRAA 156 02/19/2008   NA 138 03/17/2020   K 3.8 03/17/2020   CALCIUM 10.6 (H) 03/17/2020   CO2 29 03/17/2020   GLUCOSE 272 (H) 03/17/2020    Lab Results  Component Value Date/Time   HGBA1C 7.7 (A) 10/22/2020 10:29 AM   HGBA1C 7.9 (H) 03/17/2020 09:50 AM   HGBA1C 7.5 (H) 09/04/2019 02:40 PM   GFR 67.28 03/17/2020 09:50 AM   GFR 86.72 09/04/2019 02:40 PM   MICROALBUR 2.0 (H) 05/19/2015 09:21 AM   MICROALBUR 2.5 (H) 05/14/2014 10:01 AM    Last diabetic Eye exam:  Lab Results  Component Value Date/Time   HMDIABEYEEXA Retinopathy (A) 01/07/2021 12:00 AM    Last diabetic Foot exam: No results found for: HMDIABFOOTEX   Lab Results  Component Value Date   CHOL 145 03/17/2020   HDL 40.50 03/17/2020   LDLCALC 80 03/17/2020   TRIG 124.0 03/17/2020   CHOLHDL 4 03/17/2020    Hepatic Function Latest Ref Rng & Units 03/17/2020 09/04/2019 09/29/2018  Total Protein 6.0 - 8.3 g/dL 8.0 8.0 7.4  Albumin 3.5 - 5.2 g/dL 4.1 4.1 3.9  AST 0 - 37 U/L _0 ALT 0 - 35 U/L 18 16 17  Alk Phosphatase 39 - 117 U/L 45 54 47  Total Bilirubin 0.2 - 1.2 mg/dL 1.0 1.1 0.7  Bilirubin, Direct 0.0 - 0.3 mg/dL - - -    Lab Results  Component Value Date/Time   TSH 1.13 09/14/2013 09:59 AM   TSH 1.22 08/23/2012 10:19 AM    CBC Latest Ref Rng & Units 03/17/2020 09/04/2019 09/29/2018  WBC 4.0 - 10.5 K/uL 5.4 7.5 6.5  Hemoglobin 12.0 - 15.0 g/dL 12.9 12.5 12.4  Hematocrit 36.0 - 46.0 % 39.7 38.0 37.4  Platelets 150.0 - 400.0 K/uL 222.0 199.0 193.0    Lab Results  Component Value Date/Time   VD25OH 66 08/23/2011 10:26 AM   VD25OH 40 08/18/2009 07:43 PM    Clinical ASCVD: No  The ASCVD Risk  score (Arnett DK, et al., 2019) failed to calculate for the following reasons:   The 2019 ASCVD risk score is only valid for ages 23 to 70    Depression screen PHQ 2/9 01/09/2021 10/22/2020 10/08/2019  Decreased Interest 0 0 0  Down, Depressed, Hopeless 0 0 0  PHQ - 2 Score 0 0 0  Altered sleeping - - -  Tired, decreased energy - - -  Change in appetite - - -  Feeling bad or failure about yourself  - - -  Trouble concentrating - - -  Moving slowly or fidgety/restless - - -  Suicidal thoughts - - -  PHQ-9 Score - - -  Difficult doing work/chores - - -  Some recent data might be hidden     Social History   Tobacco Use  Smoking Status Never  Smokeless Tobacco Never   BP Readings from Last 3 Encounters:  10/22/20 130/78  05/15/20 125/71  03/17/20 (!) 168/80   Pulse Readings from Last 3 Encounters:  10/22/20 71  05/15/20 83  03/17/20 62   Wt Readings from Last 3 Encounters:  01/09/21 150 lb (68 kg)  10/22/20 148 lb 12.8 oz (67.5 kg)  05/15/20 154 lb (69.9 kg)   BMI Readings from Last 3 Encounters:  01/09/21 28.34 kg/m  10/22/20 28.12 kg/m  05/15/20 29.10 kg/m    Assessment/Interventions: Review of patient past medical history, allergies, medications, health status, including review of consultants reports, laboratory and other test data, was performed as part of comprehensive evaluation and provision of chronic care management services.   SDOH:  (Social Determinants of Health) assessments and interventions performed: Yes  SDOH Screenings   Alcohol Screen: Not on file  Depression (PHQ2-9): Low Risk    PHQ-2 Score: 0  Financial Resource Strain: Not on file  Food Insecurity: No Food Insecurity   Worried About Charity fundraiser in the Last Year: Never true   Ran Out of Food in the Last Year: Never true  Housing: Low Risk    Last Housing Risk Score: 0  Physical Activity: Not on file  Social Connections: Not on file  Stress: Not on file  Tobacco Use: Low Risk     Smoking Tobacco Use: Never   Smokeless Tobacco Use: Never   Passive Exposure: Not on file  Transportation Needs: No Transportation Needs   Lack of Transportation (Medical): No   Lack of Transportation (Non-Medical): No    CCM Care Plan  No Known Allergies  Medications Reviewed Today     Reviewed by Knox Royalty, RN (Registered Nurse) on 02/25/21 at 1450  Med List Status: <None>   Medication Order Taking? Sig Documenting Provider Last Dose Status Informant  aspirin  81 MG tablet [id number]  Take 1 tablet (81 mg total) by mouth daily. [name], MD  Active   blood glucose meter kit and supplies KIT [id number]  Use to test blood sugar up to 3 times daily. DX E11.9 [name], MD  Active   Calcium Carb-Cholecalciferol (CALCIUM-VITAMIN D) 500-200 MG-UNIT tablet [id number]  Take 1 tablet by mouth 2 (two) times daily with a meal. [name], MD  Active   diclofenac Sodium (VOLTAREN) 1 % GEL [id number]  Apply 4 g topically 4 (four) times daily. [name], MD  Active   dorzolamide-timolol (COSOPT) 22.3-6.8 MG/ML ophthalmic solution [id number]  Instill 1 drop in each eye twice a day [name], MD  Active   DUREZOL 0.05 % [facility] [id number]   [provider]  Active            Med Note Izola Price, AMY R   Mon Mar 17, 2015  9:44 AM) Received from: External Pharmacy  empagliflozin (JARDIANCE) 25 MG TABS tablet [id number]  Take 1 tablet (25 mg total) by mouth daily before breakfast. [name], MD  Active   glimepiride (AMARYL) 4 MG tablet [id number]  TAKE 1 TABLET EVERY DAY BEFORE BREAKFAST [name], MD  Active   Lancet Devices [facility]) lancets [id number]  Use to test blood sugar up to 3 times a day. DX E11.9 [name], MD  Active   lisinopril-hydrochlorothiazide (ZESTORETIC) 20-12.5 MG tablet [id number]  Take 1 tablet by mouth daily. [name], MD  Active   metFORMIN (GLUCOPHAGE) 1000 MG  tablet [id number]  Take 1 tablet (1,000 mg total) by mouth 2 (two) times daily with a meal. [name], MD  Active   Multiple Vitamin (MULTI VITAMIN DAILY PO) [id number]  Take one by mouth daily [provider]  Active            Med Note Wynona Neat, [facility]   Tue Jun 19, 2013  9:03 AM) Received from: [place] test strip [id number]  Use to test blood sugar 3 times a day [name], MD  Active   simvastatin (ZOCOR) 20 MG tablet [id number]  Take 1 tablet (20 mg total) by mouth at bedtime. [name], MD  Active             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 associated with type 2 diabetes mellitus ([place]) 08/13/2019   Moderate nonproliferative diabetic retinopathy of right eye with macular edema ([place]) 08/13/2019   Type 2 macular telangiectasis, left 08/13/2019   Retinal telangiectasia of right eye 08/13/2019   OSA on CPAP 07/20/2017   Venous insufficiency 04/26/2017   Sensorineural hearing loss (SNHL), bilateral 12/08/2016   Routine general medical examination at a health care facility 05/25/2016   Type 2 diabetes mellitus with hyperlipidemia ([place]) 02/23/2012   Obesity 08/23/2011   GLAUCOMA 08/24/2009   Left shoulder pain 02/19/2008   Hyperlipidemia associated with type 2 diabetes mellitus ([place]) 12/22/2006   Essential hypertension 12/22/2006   GERD 12/22/2006   [place] DISEASE, CERVICAL 12/22/2006   Osteopenia 12/22/2006   Other chest pain 12/22/2006    Immunization History  Administered Date(s) Administered   Fluad Quad(high Dose 65+) 03/30/2019, 03/17/2020   Influenza Split 02/18/2011, 02/23/2012   Influenza Whole 01/19/2005, 02/17/2009, 02/16/2010   Influenza, High Dose Seasonal PF 02/14/2015, 04/21/2017,  02/02/2018   Influenza,inj,Quad PF,6+ Mos 03/12/2013, 02/07/2014   PFIZER(Purple  Top)SARS-COV-2 Vaccination 05/04/2019, 05/28/2019, 01/29/2020   Pneumococcal Conjugate-13 05/25/2016   Pneumococcal Polysaccharide-23 04/12/1998, 02/16/2010   Td 08/21/2002   Tdap 08/23/2011    Conditions to be addressed/monitored:  Hypertension, Hyperlipidemia, and Diabetes  There are no care plans that you recently modified to display for this patient.     Medication Assistance: None required.  Patient affirms current coverage meets needs.  Compliance/Adherence/Medication fill history: Care Gaps: Shingrix Covid booster (due 05/31/20)  Patient's preferred pharmacy is:  [facility], [address] [place] [address] Phone: [phone] Fax: [place], [place] [address] Phone: [phone] Fax: [phone]  Uses pill box? Yes Pt endorses 100% compliance  We discussed: Current pharmacy is preferred with insurance plan and patient is satisfied with pharmacy services Patient decided to: Continue current medication management strategy  Care Plan and Follow Up Patient Decision:  Patient agrees to Care Plan and Follow-up.  Plan: Telephone follow up appointment with care management team member scheduled for:  3 months  Tomasa Blase, PharmD Clinical Pharmacist, [place]

## 2021-03-13 NOTE — Patient Instructions (Signed)
Visit Information  Following are the goals we discussed today:   Track and Manage My Blood Pressure - HTN   Timeframe:  Long-Range Goal Priority:  High Start Date:    10/15/20                         Expected End Date:   07/16/2021                  Follow Up Date 06/2020   - check blood pressure daily - write blood pressure results in a log or diary -Reduce salt in diet -Take blood pressure medication (lisinopril-Hctz) every day    Why is this important?   You won't feel high blood pressure, but it can still hurt your blood vessels.  High blood pressure can cause heart or kidney problems. It can also cause a stroke.  Making lifestyle changes like losing a little weight or eating less salt will help.  Checking your blood pressure at home and at different times of the day can help to control blood pressure.  If the doctor prescribes medicine remember to take it the way the doctor ordered.  Call the office if you cannot afford the medicine or if there are questions about it.   Track and Manage My Blood Sugars - DM   Timeframe:  Long-Range Goal Priority:  High Start Date:     10/15/20                        Expected End Date:   07/16/21                   Follow Up Date 06/2021   - check blood sugar at prescribed times - check blood sugar if I feel it is too high or too low - enter blood sugar readings and medication or insulin into daily log - take the blood sugar log to all doctor visits    Why is this important?   Checking your blood sugar at home helps to keep it from getting very high or very low.  Writing the results in a diary or log helps the doctor know how to care for you.  Your blood sugar log should have the time, date and the results.  Also, write down the amount of insulin or other medicine that you take.  Other information, like what you ate, exercise done and how you were feeling, will also be helpful.    Plan: Telephone follow up appointment with care management team  member scheduled for:  3 months  The patient has been provided with contact information for the care management team and has been advised to call with any health related questions or concerns.   Tomasa Blase, PharmD Clinical Pharmacist, [facility]    Please call the care guide team at [phone] if you need to cancel or reschedule your appointment.   Patient verbalizes understanding of instructions provided today and agrees to view in [place].

## 2021-03-20 ENCOUNTER — Ambulatory Visit: Payer: HMO | Admitting: *Deleted

## 2021-03-20 DIAGNOSIS — E1169 Type 2 diabetes mellitus with other specified complication: Secondary | ICD-10-CM

## 2021-03-20 NOTE — Chronic Care Management (AMB) (Signed)
Chronic Care Management   CCM RN Visit Note  03/20/2021 Name: Whitney [name] MRN: [id number] DOB: [birth year]  Subjective: Whitney [name] is a 86 y.o. year old female who is a primary care patient of [name], MD. The care management team was consulted for assistance with disease management and care coordination needs.    Engaged with patient by telephone for follow up visit in response to provider referral for case management and/or care coordination services.   Consent to Services:  The patient was given information about Chronic Care Management services, agreed to services, and gave verbal consent prior to initiation of services.  Please see initial visit note for detailed documentation.  Patient agreed to services and verbal consent obtained.   Assessment: Review of patient past medical history, allergies, medications, health status, including review of consultants reports, laboratory and other test data, was performed as part of comprehensive evaluation and provision of chronic care management services.   CCM Care Plan No Known Allergies  Outpatient Encounter Medications as of 03/20/2021  Medication Sig Note   aspirin 81 MG tablet Take 1 tablet (81 mg total) by mouth daily.    blood glucose meter kit and supplies KIT Use to test blood sugar up to 3 times daily. DX E11.9    Calcium Carb-Cholecalciferol (CALCIUM-VITAMIN D) 500-200 MG-UNIT tablet Take 1 tablet by mouth 2 (two) times daily with a meal.    diclofenac Sodium (VOLTAREN) 1 % GEL Apply 4 g topically 4 (four) times daily.    dorzolamide-timolol (COSOPT) 22.3-6.8 MG/ML ophthalmic solution Instill 1 drop in each eye twice a day    DUREZOL 0.05 % EMUL  (Patient not taking: Reported on 02/25/2021) 03/17/2015: Received from: External Pharmacy   empagliflozin (JARDIANCE) 25 MG TABS tablet Take 1 tablet (25 mg total) by mouth daily before breakfast.    glimepiride (AMARYL) 4 MG tablet TAKE 1 TABLET EVERY DAY BEFORE  BREAKFAST    Lancet Devices (ACCU-CHEK SOFTCLIX) lancets Use to test blood sugar up to 3 times a day. DX E11.9    lisinopril-hydrochlorothiazide (ZESTORETIC) 20-12.5 MG tablet Take 1 tablet by mouth daily.    metFORMIN (GLUCOPHAGE) 1000 MG tablet Take 1 tablet (1,000 mg total) by mouth 2 (two) times daily with a meal.    Multiple Vitamin (MULTI VITAMIN DAILY PO) Take one by mouth daily 06/19/2013: Received from: External Pharmacy   [facility] VERIO test strip Use to test blood sugar 3 times a day    simvastatin (ZOCOR) 20 MG tablet Take 1 tablet (20 mg total) by mouth at bedtime.    No facility-administered encounter medications on file as of 03/20/2021.   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 associated with type 2 diabetes mellitus ([place]) 08/13/2019   Moderate nonproliferative diabetic retinopathy of right eye with macular edema ([place]) 08/13/2019   Type 2 macular telangiectasis, left 08/13/2019   Retinal telangiectasia of right eye 08/13/2019   OSA on CPAP 07/20/2017   Venous insufficiency 04/26/2017   Sensorineural hearing loss (SNHL), bilateral 12/08/2016   Routine general medical examination at a health care facility 05/25/2016   Type 2 diabetes mellitus with hyperlipidemia ([place]) 02/23/2012   Obesity 08/23/2011   GLAUCOMA 08/24/2009   Left shoulder pain 02/19/2008   Hyperlipidemia associated with type 2 diabetes mellitus ([place]) 12/22/2006   Essential hypertension 12/22/2006   GERD 12/22/2006   [place] DISEASE, CERVICAL 12/22/2006  Osteopenia 12/22/2006   Other chest pain 12/22/2006   Conditions to be addressed/monitored:  HLD and DMII  Care Plan : RN Care Manager Plan of Care  Updates made by Knox Royalty, RN since 03/20/2021 12:00 AM     Problem: Chronic Disease Management Needs   Priority: High     Long-Range Goal: Development of plan of care for long term  chronic disease management   Start Date: 02/25/2021  Expected End Date: 02/25/2022  Priority: High  Note:   Current Barriers:  Chronic Disease Management support and education needs related to HLD and DMII Hard of hearing- lost hearing aid for (L) ear- verbalizes plans to replace in coming months Retinal issues- sees specialist for eye injections; occasional issues with reading; able to drive short distances; family assists with driving long distances    RNCM Clinical Goal(s):  Patient will demonstrate Ongoing health management independence DMII; HLD  through collaboration with RN Care manager, provider, and care team.   Interventions: 1:1 collaboration with primary care provider regarding development and update of comprehensive plan of care as evidenced by provider attestation and co-signature Inter-disciplinary care team collaboration (see longitudinal plan of care) Evaluation of current treatment plan related to  self management and patient's adherence to plan as established by provider 03/19/21- Initial assessment completed Discussed current clinical condition: patient reports overall well; states she has had a "draining, runny nose" for several days- denies fever or malaise- states "just nose drainage;" encouraged patient to contact PCP office if symptoms worsen or she begins to run a fever; patient states she is considering taking OTC medications if her symptoms do not improve Confirmed no new/ recent falls- continues not needing to use assistive devices  Hyperlipidemia Interventions:  GOAL STATUS: Goal on track Reviewed role and benefits of statin for ASCVD risk reduction Reviewed importance of limiting foods high in cholesterol Reviewed exercise goals and target of 150 minutes per week Pain assessment completed- continues to deny chronic pain; occasional (R) knee pain well managed with Voltaren gel- denies pain today Confirmed uses CPAP unit at night as prescribed; no  issues Confirmed patient received and has begun reviewing printed educational material previously mailed-- positive reinforcement provided with encouragement to continue efforts Patient asking today about strategies to keep her  memory sharp-- discussed with patient- will provide additional printed education  Diabetes Interventions: GOAL STATUS: NEW Goal Assessed patient's understanding of A1c goal: <7% Provided education to patient about basic DM disease process Counseled on importance of regular laboratory monitoring as prescribed Discussed plans with patient for ongoing care management follow up and provided patient with direct contact information for care management team Review of patient status, including review of consultants reports, relevant laboratory and other test results, and medications completed Confirmed patient monitors/ records blood sugars at home BID-TID: reports consistent blood sugar values at home between 130-160: reports consistent fasting blood sugars between 130-140 Reinforced previously provided education around general goals for blood sugar values at home: between 130-140 fasting; between 160-170 post-prandial Confirmed patient received and has begun reviewing printed educational material previously mailed-- positive reinforcement provided with encouragement to continue efforts Confirmed patient tries to follow heart healthy, low sodium, carbohydrate modified, low sugar diet- positive reinforcement provided with encouragement to continue efforts;   Lab Results  Component Value Date   HGBA1C 7.7 (A) 10/22/2020  Patient Goals/Self-Care Activities: As evidenced by review of EHR, collaboration with care team, and patient reporting during CCM RN CM outreach,  Patient Whitney [name] will: Take medications  as prescribed Attend all scheduled provider appointments Call pharmacy for medication refills Call provider office for new concerns or questions Continue to check fasting  (first thing in the morning, before eating) blood sugars at home: your goal for fasting blood sugars are between 130-140 Continue to occassionally check after-eating blood sugars 2 hours after a meal: your goal for after eating blood sugars are between 160-170 Continue to follow heart healthy, low salt, low cholesterol, carbohydrate-modified, low sugar diet Continue staying as active as possible Keep up the great work making effort to prevent falls Review enclosed educational material about strategies you can use to keep your memory sharp  Follow Up Plan:   Telephone follow up appointment with care management team member scheduled for:  Monday, June 15, 2020 at 11:30 am The patient has been provided with contact information for the care management team and has been advised to call with any health related questions or concerns    Plan: The patient has been provided with contact information for the care management team and has been advised to call with any health related questions or concerns  [name], RN, BSN, [place] [phone]: direct office

## 2021-03-20 NOTE — Patient Instructions (Signed)
Visit Information  [place], thank you for taking time to talk with me today. Please don't hesitate to contact me if I can be of assistance to you before our next scheduled telephone appointment.  Below are the goals we discussed today:   Patient Self-Care Activities: Patient Whitney [name] will: Take medications as prescribed Attend all scheduled provider appointments Call pharmacy for medication refills Call provider office for new concerns or questions Continue to check fasting (first thing in the morning, before eating) blood sugars at home: your goal for fasting blood sugars are between 130-140 Continue to occassionally check after-eating blood sugars 2 hours after a meal: your goal for after eating blood sugars are between 160-170 Continue to follow heart healthy, low salt, low cholesterol, carbohydrate-modified, low sugar diet Continue staying as active as possible Keep up the great work making effort to prevent falls Review enclosed educational material about strategies you can use to keep your memory sharp  Our next scheduled telephone follow up visit/ appointment with care management team member is scheduled on:  Monday, June 15, 2021 at 11:30 am  If you need to cancel or re-schedule our visit, please call [phone] and our care guide team will be happy to assist you.   I look forward to hearing about your progress.   [name], RN, BSN, [place] [phone]: direct office  If you are experiencing a Mental Health or [place] or need someone to talk to, please  call the Suicide and Crisis Lifeline: 988 call the [place] National Suicide Prevention Lifeline: 215-243-9768 or TTY: 734-804-5049 TTY 772-466-7775) to talk to a trained counselor call 1-800-273-TALK (toll free, 24 hour hotline) go to [facility] [place] [phone]) call 911   The  patient verbalized understanding of instructions, educational materials, and care plan provided today and agreed to receive a mailed copy of patient instructions, educational materials, and care plan  Memory Compensation Strategies  Use "WARM" strategy.  W= write it down  A= associate it  R= repeat it  M= make a mental note  2.   You can keep a Social worker.  Use a 3-ring notebook with sections for the following: calendar, important names and phone numbers,  medications, doctors' names/phone numbers, lists/reminders, and a section to journal what you did  each day.   3.    Use a calendar to write appointments down.  4.    Write yourself a schedule for the day.  This can be placed on the calendar or in a separate section of the Memory Notebook.  Keeping a  regular schedule can help memory.  5.    Use medication organizer with sections for each day or morning/evening pills.  You may need help loading it  6.    Keep a basket, or pegboard by the door.  Place items that you need to take out with you in the basket or on the pegboard.  You may also want to  include a message board for reminders.  7.    Use sticky notes.  Place sticky notes with reminders in a place where the task is performed.  For example: " turn off the  stove" placed by the stove, "lock the door" placed on the door at eye level, " take your medications" on  the bathroom mirror or by the place where you normally take your medications.  8.    Use alarms/timers.  Use while cooking to remind yourself to check on food or as a reminder to take your medicine, or as a  reminder to make a call, or as a reminder to perform another task, etc.  Management of Memory Problems  There are some general things you can do to help manage your memory problems.  Your memory may not in fact recover, but by using techniques and strategies you will be able to manage your memory difficulties better.  1)  Establish a routine. Try to establish  and then stick to a regular routine.  By doing this, you will get used to what to expect and you will reduce the need to rely on your memory.  Also, try to do things at the same time of day, such as taking your medication or checking your calendar first thing in the morning. Think about think that you can do as a part of a regular routine and make a list.  Then enter them into a daily planner to remind you.  This will help you establish a routine.  2)  Organize your environment. Organize your environment so that it is uncluttered.  Decrease visual stimulation.  Place everyday items such as keys or cell phone in the same place every day (ie.  Basket next to front door) Use post it notes with a brief message to yourself (ie. Turn off light, lock the door) Use labels to indicate where things go (ie. Which cupboards are for food, dishes, etc.) Keep a notepad and pen by the telephone to take messages  3)  Memory Aids A diary or journal/notebook/daily planner Making a list (shopping list, chore list, to do list that needs to be done) Using an alarm as a reminder (kitchen timer or cell phone alarm) Using cell phone to store information (Notes, Calendar, Reminders) Calendar/White board placed in a prominent position Post-it notes  In order for memory aids to be useful, you need to have good habits.  It's no good remembering to make a note in your journal if you don't remember to look in it.  Try setting aside a certain time of day to look in journal.  4)  Improving mood and managing fatigue. There may be other factors that contribute to memory difficulties.  Factors, such as anxiety, depression and tiredness can affect memory. Regular gentle exercise can help improve your mood and give you more energy. Simple relaxation techniques may help relieve symptoms of anxiety Try to get back to completing activities or hobbies you enjoyed doing in the past. Learn to pace yourself through activities to  decrease fatigue. Find out about some local support groups where you can share experiences with others. Try and achieve 7-8 hours of sleep at night.

## 2021-04-07 ENCOUNTER — Encounter (INDEPENDENT_AMBULATORY_CARE_PROVIDER_SITE_OTHER): Payer: HMO | Admitting: Ophthalmology

## 2021-04-08 ENCOUNTER — Telehealth: Payer: Self-pay | Admitting: Internal Medicine

## 2021-04-08 NOTE — Telephone Encounter (Signed)
Left message for patient to call back to schedule Medicare Annual Wellness Visit   Last AWV  03/17/20  Please schedule at anytime with [facility] if patient calls the office back.    40 Minutes appointment   Any questions, please call me at [phone]

## 2021-04-10 ENCOUNTER — Ambulatory Visit: Payer: HMO

## 2021-04-11 DIAGNOSIS — E785 Hyperlipidemia, unspecified: Secondary | ICD-10-CM

## 2021-04-11 DIAGNOSIS — E1169 Type 2 diabetes mellitus with other specified complication: Secondary | ICD-10-CM | POA: Diagnosis not present

## 2021-04-14 ENCOUNTER — Encounter (INDEPENDENT_AMBULATORY_CARE_PROVIDER_SITE_OTHER): Payer: HMO | Admitting: Ophthalmology

## 2021-04-16 ENCOUNTER — Encounter (INDEPENDENT_AMBULATORY_CARE_PROVIDER_SITE_OTHER): Payer: HMO | Admitting: Ophthalmology

## 2021-04-20 ENCOUNTER — Encounter (INDEPENDENT_AMBULATORY_CARE_PROVIDER_SITE_OTHER): Payer: Self-pay | Admitting: Ophthalmology

## 2021-04-20 ENCOUNTER — Ambulatory Visit (INDEPENDENT_AMBULATORY_CARE_PROVIDER_SITE_OTHER): Payer: HMO | Admitting: Ophthalmology

## 2021-04-20 ENCOUNTER — Other Ambulatory Visit: Payer: Self-pay

## 2021-04-20 DIAGNOSIS — Z9989 Dependence on other enabling machines and devices: Secondary | ICD-10-CM

## 2021-04-20 DIAGNOSIS — E113312 Type 2 diabetes mellitus with moderate nonproliferative diabetic retinopathy with macular edema, left eye: Secondary | ICD-10-CM

## 2021-04-20 DIAGNOSIS — E113311 Type 2 diabetes mellitus with moderate nonproliferative diabetic retinopathy with macular edema, right eye: Secondary | ICD-10-CM

## 2021-04-20 DIAGNOSIS — G4733 Obstructive sleep apnea (adult) (pediatric): Secondary | ICD-10-CM

## 2021-04-20 MED ORDER — AFLIBERCEPT 2MG/0.05ML IZ SOLN FOR KALEIDOSCOPE
2.0000 mg | INTRAVITREAL | Status: AC | PRN
  Administered 2021-04-20: 2 mg via INTRAVITREAL

## 2021-04-20 NOTE — Assessment & Plan Note (Signed)
OS today with recurrence of center involved CSME longer interval follow-up of 12 weeks.  Will repeat injection Eylea today and shorten interval examination to maintain excellent appearance at 8-week examination

## 2021-04-20 NOTE — Progress Notes (Addendum)
04/20/2021     CHIEF COMPLAINT Patient presents for  Chief Complaint  Patient presents with   Retina Follow Up      HISTORY OF PRESENT ILLNESS: Whitney [name] is a 87 y.o. female who presents to the clinic today for:   HPI     Retina Follow Up           Diagnosis: Diabetic Retinopathy   Laterality: left eye   Onset: 12 weeks ago   Severity: mild   Duration: 12 weeks   Course: stable         Comments   12 week fu OS and OCT and Eylea OS- Moderate NPDR left eye- Last injection was 01/26/2021  Pt states VA OU stable since last visit. Pt denies FOL, floaters, or ocular pain OU.   Pt reports using Cosopt BID OU        Last edited by Kendra Opitz, COA on 04/20/2021  2:27 PM.      Referring physician: Clent Jacks, MD [address] [place],  [place] [address]  HISTORICAL INFORMATION:   Selected notes from the MEDICAL RECORD NUMBER    Lab Results  Component Value Date   HGBA1C 7.7 (A) 10/22/2020     CURRENT MEDICATIONS: Current Outpatient Medications (Ophthalmic Drugs)  Medication Sig   dorzolamide-timolol (COSOPT) 22.3-6.8 MG/ML ophthalmic solution Instill 1 drop in each eye twice a day   DUREZOL 0.05 % EMUL  (Patient not taking: Reported on 02/25/2021)   No current facility-administered medications for this visit. (Ophthalmic Drugs)   Current Outpatient Medications (Other)  Medication Sig   aspirin 81 MG tablet Take 1 tablet (81 mg total) by mouth daily.   blood glucose meter kit and supplies KIT Use to test blood sugar up to 3 times daily. DX E11.9   Calcium Carb-Cholecalciferol (CALCIUM-VITAMIN D) 500-200 MG-UNIT tablet Take 1 tablet by mouth 2 (two) times daily with a meal.   diclofenac Sodium (VOLTAREN) 1 % GEL Apply 4 g topically 4 (four) times daily.   empagliflozin (JARDIANCE) 25 MG TABS tablet Take 1 tablet (25 mg total) by mouth daily before breakfast.   glimepiride (AMARYL) 4 MG tablet TAKE 1 TABLET EVERY DAY BEFORE BREAKFAST    Lancet Devices (ACCU-CHEK SOFTCLIX) lancets Use to test blood sugar up to 3 times a day. DX E11.9   lisinopril-hydrochlorothiazide (ZESTORETIC) 20-12.5 MG tablet Take 1 tablet by mouth daily.   metFORMIN (GLUCOPHAGE) 1000 MG tablet Take 1 tablet (1,000 mg total) by mouth 2 (two) times daily with a meal.   Multiple Vitamin (MULTI VITAMIN DAILY PO) Take one by mouth daily   ONETOUCH VERIO test strip Use to test blood sugar 3 times a day   simvastatin (ZOCOR) 20 MG tablet Take 1 tablet (20 mg total) by mouth at bedtime.   No current facility-administered medications for this visit. (Other)      REVIEW OF SYSTEMS:    ALLERGIES No Known Allergies  PAST MEDICAL HISTORY Past Medical History:  Diagnosis Date   Anxiety    Baker's cyst    Chest pain    Diabetes mellitus type 2, uncontrolled    Diabetes mellitus without complication (HCC)    Disc disease, degenerative, cervical    Diverticulosis of colon    DJD (degenerative joint disease) of knee    Facial pain, atypical    GERD (gastroesophageal reflux disease)    Glaucoma    Hypercholesteremia    Hypertension    Lumbar  spondylosis    Obesity    Onychomycosis    Osteopenia    Past Surgical History:  Procedure Laterality Date   right eye surgery  09/2010   retina in right eye   VAGINAL HYSTERECTOMY  2002   Dr. Kem Kays repair    FAMILY HISTORY Family History  Problem Relation Age of Onset   Diabetes Mother    Colon cancer Neg Hx    Stomach cancer Neg Hx     SOCIAL HISTORY Social History   Tobacco Use   Smoking status: Never   Smokeless tobacco: Never  Vaping Use   Vaping Use: Never used  Substance Use Topics   Alcohol use: No   Drug use: No         OPHTHALMIC EXAM:  Base Eye Exam     Visual Acuity (Snellen - Linear)       Right Left   Dist cc 20/40 -2 20/40 -1   Dist ph cc 20/30 -1 NI         Tonometry (Tonopen, 2:32 PM)       Right Left   Pressure 12 14         Pupils       Pupils  Dark Light Shape React APD   Right PERRL 3 3 Irregular Minimal None   Left PERRL 3 3 Irregular Minimal None         Visual Fields (Counting fingers)       Left Right    Full Full         Extraocular Movement       Right Left    Full, Ortho Full, Ortho         Neuro/Psych     Oriented x3: Yes   Mood/Affect: Normal         Dilation     Left eye: 1.0% Mydriacyl, 2.5% Phenylephrine @ 2:32 PM           Slit Lamp and Fundus Exam     External Exam       Right Left   External Normal Normal         Slit Lamp Exam       Right Left   Lids/Lashes Normal Normal   Conjunctiva/Sclera White and quiet White and quiet   Cornea Clear Clear   Anterior Chamber Deep and quiet Deep and quiet   Iris Round and reactive Round and reactive   Lens Posterior chamber intraocular lens Posterior chamber intraocular lens   Anterior Vitreous Normal Normal         Fundus Exam       Right Left   Posterior Vitreous  Normal   Disc  Normal   C/D Ratio  0.7   Macula  Microaneurysms, increased macular thickening, Cystoid macular edema, Moderate clinically significant macular edema   Vessels  NPDR, moderate   Periphery  Normal            IMAGING AND PROCEDURES  Imaging and Procedures for 04/20/21  Intravitreal Injection, Pharmacologic Agent - OS - Left Eye       Time Out 04/20/2021. 3:14 PM. Confirmed correct patient, procedure, site, and patient consented.   Anesthesia Topical anesthesia was used. Anesthetic medications included Akten 3.5%, Lidocaine 4%.   Procedure Preparation included Tobramycin 0.3%, 10% betadine to eyelids, 5% betadine to ocular surface, Ofloxacin . A 30 gauge needle was used.   Injection: 2 mg aflibercept 2 MG/0.05ML   Route: Intravitreal, Site: Left Eye  NDC: [id number], Lot: [id number], Waste: 0 mL   Post-op Post injection exam found visual acuity of at least counting fingers. The patient tolerated the procedure well. There were no  complications. The patient received written and verbal post procedure care education. Post injection medications were not given.      OCT, Retina - OU - Both Eyes       Right Eye Quality was good. Scan locations included subfoveal. Central Foveal Thickness: 344. Progression has worsened. Findings include abnormal foveal contour, cystoid macular edema.   Left Eye Quality was good. Scan locations included subfoveal. Central Foveal Thickness: 502. Progression has worsened. Findings include abnormal foveal contour, cystoid macular edema.   Notes OS increased macular edema initially and existed for -week with increased CSME now some  12  weeks after last injection, repeat injection OS today and shorten interval examination   OD, decreased CSME thus worse at 7-week only residual macular changes which look chronic likely from prior untreated MAC-TEL superimposed.  This may be the best baseline we obtain.  But nonetheless longer intervals of therapy now possible, follow-up OD as scheduled               ASSESSMENT/PLAN:  OSA on CPAP Patient remains compliant on CPAP therapy minimizing nightly hypoxic damage  Moderate nonproliferative diabetic retinopathy of left eye with macular edema associated with type 2 diabetes mellitus ([place]) OS today with recurrence of center involved CSME longer interval follow-up of 12 weeks.  Will repeat injection Eylea today and shorten interval examination to maintain excellent appearance at 8-week examination  Moderate nonproliferative diabetic retinopathy of right eye with macular edema ([place]) Improved today at 8-week follow-up interval follow-up as scheduled next      ICD-10-CM   1. Moderate nonproliferative diabetic retinopathy of left eye with macular edema associated with type 2 diabetes mellitus (HCC)  O13.0865 Intravitreal Injection, Pharmacologic Agent - OS - Left Eye    OCT, Retina - OU - Both Eyes    aflibercept (EYLEA) SOLN 2 mg    2.  Moderate nonproliferative diabetic retinopathy of right eye with macular edema associated with type 2 diabetes mellitus ([place])  E11.3311     3. OSA on CPAP  G47.33    Z99.89       1.  OS overall vastly improved on combination intravitreal Avastin maintained also by continuous CPAP use for OSA.  Today at 12-week interval did increase in CSME.  We will now repeat injection today and examination next in 8 weeks  2.  OD follow-up as scheduled  3.  Ophthalmic Meds Ordered this visit:  Meds ordered this encounter  Medications   aflibercept (EYLEA) SOLN 2 mg       Return in about 8 weeks (around 06/15/2021) for dilate, OS, EYLEA OCT.  There are no Patient Instructions on file for this visit.   Explained the diagnoses, plan, and follow up with the patient and they expressed understanding.  Patient expressed understanding of the importance of proper follow up care.   Clent Demark Martin Smeal M.D. Diseases & Surgery of the Retina and Vitreous [facility]abetic [place] 04/20/21     Abbreviations: M myopia (nearsighted); A astigmatism; H hyperopia (farsighted); P presbyopia; Mrx spectacle prescription;  CTL contact lenses; OD right eye; OS left eye; OU both eyes  XT exotropia; ET esotropia; PEK punctate epithelial keratitis; PEE punctate epithelial erosions; DES dry eye syndrome; MGD meibomian gland dysfunction; ATs artificial tears; PFAT's preservative free artificial tears; [place] nuclear sclerotic  cataract; PSC posterior subcapsular cataract; ERM epi-retinal membrane; PVD posterior vitreous detachment; RD retinal detachment; DM diabetes mellitus; DR diabetic retinopathy; NPDR non-proliferative diabetic retinopathy; PDR proliferative diabetic retinopathy; CSME clinically significant macular edema; DME diabetic macular edema; dbh dot blot hemorrhages; CWS cotton wool spot; POAG primary open angle glaucoma; C/D cup-to-disc ratio; HVF humphrey visual field; GVF goldmann visual field; OCT optical  coherence tomography; IOP intraocular pressure; BRVO Branch retinal vein occlusion; CRVO central retinal vein occlusion; CRAO central retinal artery occlusion; BRAO branch retinal artery occlusion; RT retinal tear; SB scleral buckle; PPV pars plana vitrectomy; VH Vitreous hemorrhage; PRP panretinal laser photocoagulation; IVK intravitreal kenalog; VMT vitreomacular traction; MH Macular hole;  NVD neovascularization of the disc; NVE neovascularization elsewhere; AREDS age related eye disease study; ARMD age related macular degeneration; POAG primary open angle glaucoma; EBMD epithelial/anterior basement membrane dystrophy; ACIOL anterior chamber intraocular lens; IOL intraocular lens; PCIOL posterior chamber intraocular lens; Phaco/IOL phacoemulsification with intraocular lens placement; [place] photorefractive keratectomy; LASIK laser assisted in situ keratomileusis; HTN hypertension; DM diabetes mellitus; COPD chronic obstructive pulmonary disease

## 2021-04-20 NOTE — Assessment & Plan Note (Signed)
Improved today at 8-week follow-up interval follow-up as scheduled next

## 2021-04-20 NOTE — Assessment & Plan Note (Signed)
Patient remains compliant on CPAP therapy minimizing nightly hypoxic damage

## 2021-04-21 ENCOUNTER — Ambulatory Visit (INDEPENDENT_AMBULATORY_CARE_PROVIDER_SITE_OTHER): Payer: HMO | Admitting: Ophthalmology

## 2021-04-21 ENCOUNTER — Encounter (INDEPENDENT_AMBULATORY_CARE_PROVIDER_SITE_OTHER): Payer: Self-pay | Admitting: Ophthalmology

## 2021-04-21 DIAGNOSIS — H35071 Retinal telangiectasis, right eye: Secondary | ICD-10-CM | POA: Diagnosis not present

## 2021-04-21 DIAGNOSIS — E113312 Type 2 diabetes mellitus with moderate nonproliferative diabetic retinopathy with macular edema, left eye: Secondary | ICD-10-CM

## 2021-04-21 DIAGNOSIS — H35072 Retinal telangiectasis, left eye: Secondary | ICD-10-CM | POA: Diagnosis not present

## 2021-04-21 DIAGNOSIS — E113311 Type 2 diabetes mellitus with moderate nonproliferative diabetic retinopathy with macular edema, right eye: Secondary | ICD-10-CM

## 2021-04-21 MED ORDER — BEVACIZUMAB 2.5 MG/0.1ML IZ SOSY
2.5000 mg | PREFILLED_SYRINGE | INTRAVITREAL | Status: AC | PRN
  Administered 2021-04-21: 2.5 mg via INTRAVITREAL

## 2021-04-21 NOTE — Progress Notes (Signed)
04/21/2021     CHIEF COMPLAINT Patient presents for  Chief Complaint  Patient presents with   Retina Follow Up    For history of diabetic center involved CSME OU, most recently postinjection day #1 OS for worsening of CSME.  And possible injection into vegF OD today  HISTORY OF PRESENT ILLNESS: Whitney [name] is a 87 y.o. female who presents to the clinic today for:   HPI     Retina Follow Up           Diagnosis: Diabetic Retinopathy   Laterality: right eye   Onset: 8 weeks ago   Severity: mild   Duration: 8 weeks   Course: stable         Comments   8 week fu OD and OCT and Avastin OD  Pt states [facility] OU stable since last visit. Pt denies FOL, floaters, or ocular pain OU.  Pt states, "everything seems to be about the same. Not much change."        Last edited by Kendra Opitz, COA on 04/21/2021 10:12 AM.      Referring physician: [name], MD [place],  [place] [address]  HISTORICAL INFORMATION:   Selected notes from the MEDICAL RECORD NUMBER    Lab Results  Component Value Date   HGBA1C 7.7 (A) 10/22/2020     CURRENT MEDICATIONS: Current Outpatient Medications (Ophthalmic Drugs)  Medication Sig   dorzolamide-timolol (COSOPT) 22.3-6.8 MG/ML ophthalmic solution Instill 1 drop in each eye twice a day   DUREZOL 0.05 % EMUL  (Patient not taking: Reported on 02/25/2021)   No current facility-administered medications for this visit. (Ophthalmic Drugs)   Current Outpatient Medications (Other)  Medication Sig   aspirin 81 MG tablet Take 1 tablet (81 mg total) by mouth daily.   blood glucose meter kit and supplies KIT Use to test blood sugar up to 3 times daily. DX E11.9   Calcium Carb-Cholecalciferol (CALCIUM-VITAMIN D) 500-200 MG-UNIT tablet Take 1 tablet by mouth 2 (two) times daily with a meal.   diclofenac Sodium (VOLTAREN) 1 % GEL Apply 4 g topically 4 (four) times daily.   empagliflozin (JARDIANCE) 25 MG TABS tablet  Take 1 tablet (25 mg total) by mouth daily before breakfast.   glimepiride (AMARYL) 4 MG tablet TAKE 1 TABLET EVERY DAY BEFORE BREAKFAST   Lancet Devices (ACCU-CHEK SOFTCLIX) lancets Use to test blood sugar up to 3 times a day. DX E11.9   lisinopril-hydrochlorothiazide (ZESTORETIC) 20-12.5 MG tablet Take 1 tablet by mouth daily.   metFORMIN (GLUCOPHAGE) 1000 MG tablet Take 1 tablet (1,000 mg total) by mouth 2 (two) times daily with a meal.   Multiple Vitamin (MULTI VITAMIN DAILY PO) Take one by mouth daily   ONETOUCH VERIO test strip Use to test blood sugar 3 times a day   simvastatin (ZOCOR) 20 MG tablet Take 1 tablet (20 mg total) by mouth at bedtime.   No current facility-administered medications for this visit. (Other)      REVIEW OF SYSTEMS:    ALLERGIES No Known Allergies  PAST MEDICAL HISTORY Past Medical History:  Diagnosis Date   Anxiety    Baker's cyst    Chest pain    Diabetes mellitus type 2, uncontrolled    Diabetes mellitus without complication (HCC)    Disc disease, degenerative, cervical    Diverticulosis of colon    DJD (degenerative joint disease) of knee    Facial pain, atypical  GERD (gastroesophageal reflux disease)    Glaucoma    Hypercholesteremia    Hypertension    Lumbar spondylosis    Obesity    Onychomycosis    Osteopenia    Past Surgical History:  Procedure Laterality Date   right eye surgery  09/2010   retina in right eye   VAGINAL HYSTERECTOMY  2002   Dr. Kem Kays repair    FAMILY HISTORY Family History  Problem Relation Age of Onset   Diabetes Mother    Colon cancer Neg Hx    Stomach cancer Neg Hx     SOCIAL HISTORY Social History   Tobacco Use   Smoking status: Never   Smokeless tobacco: Never  Vaping Use   Vaping Use: Never used  Substance Use Topics   Alcohol use: No   Drug use: No         OPHTHALMIC EXAM:  Base Eye Exam     Visual Acuity (ETDRS)       Right Left   Dist cc 20/30 20/30 -1   Dist ph  cc NI NI    Correction: Glasses         Tonometry (Tonopen, 10:16 AM)       Right Left   Pressure 15 15         Pupils       Pupils Dark Light Shape React APD   Right PERRL 3 3 Round Minimal None   Left PERRL 3 3 Round Minimal None         Visual Fields (Counting fingers)       Left Right    Full Full         Extraocular Movement       Right Left    Full, Ortho Full, Ortho         Neuro/Psych     Oriented x3: Yes   Mood/Affect: Normal         Dilation     Right eye: 1.0% Mydriacyl, 2.5% Phenylephrine @ 10:16 AM           Slit Lamp and Fundus Exam     External Exam       Right Left   External Normal Normal         Slit Lamp Exam       Right Left   Lids/Lashes Normal Normal   Conjunctiva/Sclera White and quiet White and quiet   Cornea Clear Clear   Anterior Chamber Deep and quiet Deep and quiet   Iris Round and reactive Round and reactive   Lens Posterior chamber intraocular lens Posterior chamber intraocular lens   Anterior Vitreous Normal Normal         Fundus Exam       Right Left   Posterior Vitreous Normal    Disc Normal    C/D Ratio 0.55    Macula Microaneurysms,, trace clinical CSME    Vessels NPDR, moderate    Periphery Normal             IMAGING AND PROCEDURES  Imaging and Procedures for 04/21/21  OCT, Retina - OU - Both Eyes       Right Eye Quality was good. Scan locations included subfoveal. Central Foveal Thickness: 344. Progression has worsened. Findings include abnormal foveal contour, cystoid macular edema.   Left Eye Quality was good. Scan locations included subfoveal. Central Foveal Thickness: 317. Progression has worsened. Findings include abnormal foveal contour, cystoid macular edema.   Notes OS vastly improved  1 day post injection antivegF for center involved CSME.,  Follow-up as scheduled  OD, CSME thus worse at 8-week only residual macular changes which look chronic likely from prior  untreated MAC-TEL superimposed.  This may be the best baseline we obtain.  But nonetheless longer intervals of therapy now possible, repeat injection Avastin OD today       Intravitreal Injection, Pharmacologic Agent - OD - Right Eye       Time Out 04/21/2021. 11:03 AM. Confirmed correct patient, procedure, site, and patient consented.   Anesthesia Topical anesthesia was used. Anesthetic medications included Lidocaine 4%.   Procedure Preparation included 10% betadine to eyelids, 5% betadine to ocular surface, Ofloxacin . A 30 gauge needle was used.   Injection: 2.5 mg bevacizumab 2.5 MG/0.1ML   Route: Intravitreal, Site: Right Eye   NDC: [phone], Lot: [id number]   Post-op Post injection exam found visual acuity of at least counting fingers. The patient tolerated the procedure well. There were no complications. The patient received written and verbal post procedure care education. Post injection medications included ocuflox.              ASSESSMENT/PLAN:  Moderate nonproliferative diabetic retinopathy of left eye with macular edema associated with type 2 diabetes mellitus (HCC) Vastly improved OS overnight post injection Eylea yesterday  Moderate nonproliferative diabetic retinopathy of right eye with macular edema (HCC) OD slightly increasing CME, CSME at 8-week interval today.  We will repeat injection into vegF, Avastin today OD  Type 2 macular telangiectasis, left Compliant with CPAP  Retinal telangiectasia of right eye Compliant with CPAP     ICD-10-CM   1. Moderate nonproliferative diabetic retinopathy of right eye with macular edema associated with type 2 diabetes mellitus (HCC)  E11.3311 OCT, Retina - OU - Both Eyes    Intravitreal Injection, Pharmacologic Agent - OD - Right Eye    bevacizumab (AVASTIN) SOSY 2.5 mg    2. Moderate nonproliferative diabetic retinopathy of left eye with macular edema associated with type 2 diabetes mellitus ([place])  H06.2376      3. Type 2 macular telangiectasis, left  H35.072     4. Retinal telangiectasia of right eye  H35.071       1.  OD center involved CSME slightly worse at 7-week interval.  Repeat injection antivegF, Avastin OD today  2.  OS vastly improved 1 day post injection Eylea follow-up as scheduled OS  3.  Ophthalmic Meds Ordered this visit:  Meds ordered this encounter  Medications   bevacizumab (AVASTIN) SOSY 2.5 mg       Return in about 7 weeks (around 06/09/2021) for dilate, OD, AVASTIN OCT.  There are no Patient Instructions on file for this visit.   Explained the diagnoses, plan, and follow up with the patient and they expressed understanding.  Patient expressed understanding of the importance of proper follow up care.   Clent Demark Laporsha Grealish M.D. Diseases & Surgery of the Retina and Vitreous Retina & Diabetic [place] 04/21/21     Abbreviations: M myopia (nearsighted); A astigmatism; H hyperopia (farsighted); P presbyopia; Mrx spectacle prescription;  CTL contact lenses; OD right eye; OS left eye; OU both eyes  XT exotropia; ET esotropia; PEK punctate epithelial keratitis; PEE punctate epithelial erosions; DES dry eye syndrome; MGD meibomian gland dysfunction; ATs artificial tears; PFAT's preservative free artificial tears; [place] nuclear sclerotic cataract; PSC posterior subcapsular cataract; ERM epi-retinal membrane; PVD posterior vitreous detachment; RD retinal detachment; DM diabetes mellitus; DR diabetic retinopathy; NPDR non-proliferative  diabetic retinopathy; PDR proliferative diabetic retinopathy; CSME clinically significant macular edema; DME diabetic macular edema; dbh dot blot hemorrhages; CWS cotton wool spot; POAG primary open angle glaucoma; C/D cup-to-disc ratio; HVF humphrey visual field; GVF goldmann visual field; OCT optical coherence tomography; IOP intraocular pressure; BRVO Branch retinal vein occlusion; CRVO central retinal vein occlusion; CRAO central retinal artery  occlusion; BRAO branch retinal artery occlusion; RT retinal tear; SB scleral buckle; PPV pars plana vitrectomy; VH Vitreous hemorrhage; PRP panretinal laser photocoagulation; IVK intravitreal kenalog; VMT vitreomacular traction; MH Macular hole;  NVD neovascularization of the disc; NVE neovascularization elsewhere; AREDS age related eye disease study; ARMD age related macular degeneration; POAG primary open angle glaucoma; EBMD epithelial/anterior basement membrane dystrophy; ACIOL anterior chamber intraocular lens; IOL intraocular lens; PCIOL posterior chamber intraocular lens; Phaco/IOL phacoemulsification with intraocular lens placement; [place] photorefractive keratectomy; LASIK laser assisted in situ keratomileusis; HTN hypertension; DM diabetes mellitus; COPD chronic obstructive pulmonary disease

## 2021-04-21 NOTE — Assessment & Plan Note (Signed)
Compliant with CPAP 

## 2021-04-21 NOTE — Assessment & Plan Note (Signed)
Vastly improved OS overnight post injection Eylea yesterday

## 2021-04-21 NOTE — Assessment & Plan Note (Signed)
OD slightly increasing CME, CSME at 8-week interval today.  We will repeat injection into vegF, Avastin today OD

## 2021-04-22 ENCOUNTER — Ambulatory Visit
Admission: RE | Admit: 2021-04-22 | Discharge: 2021-04-22 | Disposition: A | Discharge status: Home / Self Care | Payer: HMO | Source: Ambulatory Visit | Attending: Internal Medicine | Admitting: Internal Medicine

## 2021-04-22 DIAGNOSIS — Z1231 Encounter for screening mammogram for malignant neoplasm of breast: Secondary | ICD-10-CM | POA: Diagnosis not present

## 2021-04-23 ENCOUNTER — Encounter (INDEPENDENT_AMBULATORY_CARE_PROVIDER_SITE_OTHER): Payer: HMO | Admitting: Ophthalmology

## 2021-05-18 ENCOUNTER — Ambulatory Visit: Payer: HMO | Admitting: Adult Health

## 2021-06-09 ENCOUNTER — Other Ambulatory Visit: Payer: Self-pay

## 2021-06-09 ENCOUNTER — Ambulatory Visit (INDEPENDENT_AMBULATORY_CARE_PROVIDER_SITE_OTHER): Payer: HMO | Admitting: Ophthalmology

## 2021-06-09 ENCOUNTER — Encounter (INDEPENDENT_AMBULATORY_CARE_PROVIDER_SITE_OTHER): Payer: Self-pay | Admitting: Ophthalmology

## 2021-06-09 DIAGNOSIS — G4733 Obstructive sleep apnea (adult) (pediatric): Secondary | ICD-10-CM

## 2021-06-09 DIAGNOSIS — Z9989 Dependence on other enabling machines and devices: Secondary | ICD-10-CM | POA: Diagnosis not present

## 2021-06-09 DIAGNOSIS — E113312 Type 2 diabetes mellitus with moderate nonproliferative diabetic retinopathy with macular edema, left eye: Secondary | ICD-10-CM | POA: Diagnosis not present

## 2021-06-09 DIAGNOSIS — E113311 Type 2 diabetes mellitus with moderate nonproliferative diabetic retinopathy with macular edema, right eye: Secondary | ICD-10-CM | POA: Diagnosis not present

## 2021-06-09 MED ORDER — AFLIBERCEPT 2MG/0.05ML IZ SOLN FOR KALEIDOSCOPE
2.0000 mg | INTRAVITREAL | Status: AC | PRN
  Administered 2021-06-09: 2 mg via INTRAVITREAL

## 2021-06-09 NOTE — Assessment & Plan Note (Signed)
Continue on CPAP, compliant today

## 2021-06-09 NOTE — Assessment & Plan Note (Signed)
Much improved OS, currently at 7-week with improved and stability of CSME temporally.  Repeat injection Eylea today and follow-up again in 7 weeks

## 2021-06-09 NOTE — Progress Notes (Signed)
06/09/2021     CHIEF COMPLAINT Patient presents for  Chief Complaint  Patient presents with   Diabetic Retinopathy with Macular Edema      HISTORY OF PRESENT ILLNESS: Whitney [name] is a 87 y.o. female who presents to the clinic today for:   HPI   OD, currently at 7-week follow-up interval.  For CSME OU.  OS 7-week and 1 day interval.  No interval change in medical history or vision.  Blood sugars reasonably good per patient Last edited by Hurman Horn, MD on 06/09/2021 10:06 AM.      Referring physician: [name], MD [place],  [place] [id number]  HISTORICAL INFORMATION:   Selected notes from the MEDICAL RECORD NUMBER    Lab Results  Component Value Date   HGBA1C 7.7 (A) 10/22/2020     CURRENT MEDICATIONS: Current Outpatient Medications (Ophthalmic Drugs)  Medication Sig   dorzolamide-timolol (COSOPT) 22.3-6.8 MG/ML ophthalmic solution Instill 1 drop in each eye twice a day   DUREZOL 0.05 % EMUL  (Patient not taking: Reported on 02/25/2021)   No current facility-administered medications for this visit. (Ophthalmic Drugs)   Current Outpatient Medications (Other)  Medication Sig   aspirin 81 MG tablet Take 1 tablet (81 mg total) by mouth daily.   blood glucose meter kit and supplies KIT Use to test blood sugar up to 3 times daily. DX E11.9   Calcium Carb-Cholecalciferol (CALCIUM-VITAMIN D) 500-200 MG-UNIT tablet Take 1 tablet by mouth 2 (two) times daily with a meal.   diclofenac Sodium (VOLTAREN) 1 % GEL Apply 4 g topically 4 (four) times daily.   empagliflozin (JARDIANCE) 25 MG TABS tablet Take 1 tablet (25 mg total) by mouth daily before breakfast.   glimepiride (AMARYL) 4 MG tablet TAKE 1 TABLET EVERY DAY BEFORE BREAKFAST   Lancet Devices (ACCU-CHEK SOFTCLIX) lancets Use to test blood sugar up to 3 times a day. DX E11.9   lisinopril-hydrochlorothiazide (ZESTORETIC) 20-12.5 MG tablet Take 1 tablet by mouth daily.   metFORMIN  (GLUCOPHAGE) 1000 MG tablet Take 1 tablet (1,000 mg total) by mouth 2 (two) times daily with a meal.   Multiple Vitamin (MULTI VITAMIN DAILY PO) Take one by mouth daily   ONETOUCH VERIO test strip Use to test blood sugar 3 times a day   simvastatin (ZOCOR) 20 MG tablet Take 1 tablet (20 mg total) by mouth at bedtime.   No current facility-administered medications for this visit. (Other)      REVIEW OF SYSTEMS: ROS   Negative for: Constitutional, Gastrointestinal, Neurological, Skin, Genitourinary, Musculoskeletal, HENT, Endocrine, Cardiovascular, Eyes, Respiratory, Psychiatric, Allergic/Imm, Heme/Lymph Last edited by Hurman Horn, MD on 06/09/2021 10:04 AM.       ALLERGIES No Known Allergies  PAST MEDICAL HISTORY Past Medical History:  Diagnosis Date   Anxiety    Baker's cyst    Chest pain    Diabetes mellitus type 2, uncontrolled    Diabetes mellitus without complication (HCC)    Disc disease, degenerative, cervical    Diverticulosis of colon    DJD (degenerative joint disease) of knee    Facial pain, atypical    GERD (gastroesophageal reflux disease)    Glaucoma    Hypercholesteremia    Hypertension    Lumbar spondylosis    Obesity    Onychomycosis    Osteopenia    Past Surgical History:  Procedure Laterality Date   right eye surgery  09/2010   retina in right eye  VAGINAL HYSTERECTOMY  2002   Dr. Kem Kays repair    FAMILY HISTORY Family History  Problem Relation Age of Onset   Diabetes Mother    Colon cancer Neg Hx    Stomach cancer Neg Hx     SOCIAL HISTORY Social History   Tobacco Use   Smoking status: Never   Smokeless tobacco: Never  Vaping Use   Vaping Use: Never used  Substance Use Topics   Alcohol use: No   Drug use: No         OPHTHALMIC EXAM:  Base Eye Exam     Visual Acuity (ETDRS)       Right Left   Dist cc 20/30 -2 20/30 +2    Correction: Glasses         Tonometry (Tonopen, 10:09 AM)       Right Left    Pressure 9 8         Pupils       Pupils APD   Right PERRL None   Left PERRL None         Visual Fields       Left Right    Full Full         Neuro/Psych     Oriented x3: Yes   Mood/Affect: Normal         Dilation     Left eye: 2.5% Phenylephrine, 1.0% Mydriacyl @ 10:09 AM           Slit Lamp and Fundus Exam     External Exam       Right Left   External Normal Normal         Slit Lamp Exam       Right Left   Lids/Lashes Normal Normal   Conjunctiva/Sclera White and quiet White and quiet   Cornea Clear Clear   Anterior Chamber Deep and quiet Deep and quiet   Iris Round and reactive Round and reactive   Lens Posterior chamber intraocular lens Posterior chamber intraocular lens   Anterior Vitreous Normal Normal         Fundus Exam       Right Left   Posterior Vitreous  Normal   Disc  Normal   C/D Ratio  0.7   Macula  Microaneurysms, increased macular thickening, Cystoid macular edema, Moderate clinically significant macular edema   Vessels  NPDR, moderate   Periphery  Normal            IMAGING AND PROCEDURES  Imaging and Procedures for 06/09/21  OCT, Retina - OU - Both Eyes       Right Eye Quality was good. Scan locations included subfoveal. Central Foveal Thickness: 319. Progression has improved. Findings include abnormal foveal contour, cystoid macular edema.   Left Eye Quality was good. Scan locations included subfoveal. Central Foveal Thickness: 235. Progression has improved. Findings include abnormal foveal contour, cystoid macular edema.   Notes OS vastly improved 7-week and 1 day post injection antivegF for center involved CSME.,   OD, overall improved as well region superotemporal which will be chronic with cavitary old CME.     Intravitreal Injection, Pharmacologic Agent - OS - Left Eye       Time Out 06/09/2021. 10:37 AM. Confirmed correct patient, procedure, site, and patient consented.   Anesthesia Topical  anesthesia was used. Anesthetic medications included Whitney [name] 3.5%, Lidocaine 4%.   Procedure Preparation included Tobramycin 0.3%, 10% betadine to eyelids, 5% betadine to ocular surface, Ofloxacin . A 30  gauge needle was used.   Injection: 2 mg aflibercept 2 MG/0.05ML   Route: Intravitreal, Site: Left Eye   NDC: [id number], Lot: [id number], Waste: 0 mL   Post-op Post injection exam found visual acuity of at least counting fingers. The patient tolerated the procedure well. There were no complications. The patient received written and verbal post procedure care education. Post injection medications included gentamicin.              ASSESSMENT/PLAN:  Moderate nonproliferative diabetic retinopathy of right eye with macular edema (HCC) OD vastly improved, currently at 7-week follow-up, follow-up as scheduled  Moderate nonproliferative diabetic retinopathy of left eye with macular edema associated with type 2 diabetes mellitus ([place]) Much improved OS, currently at 7-week with improved and stability of CSME temporally.  Repeat injection Eylea today and follow-up again in 7 weeks  OSA on CPAP Continue on CPAP, compliant today      ICD-10-CM   1. Moderate nonproliferative diabetic retinopathy of left eye with macular edema associated with type 2 diabetes mellitus (HCC)  V69.4503 OCT, Retina - OU - Both Eyes    Intravitreal Injection, Pharmacologic Agent - OS - Left Eye    aflibercept (EYLEA) SOLN 2 mg    2. Moderate nonproliferative diabetic retinopathy of right eye with macular edema associated with type 2 diabetes mellitus ([place])  E11.3311     3. OSA on CPAP  G47.33    Z99.89       1.  OD remained stable and improved.  Continue examination as scheduled neck  2.  OS, currently at 7-week follow-up, much improved CSME, repeat examination in 7 weeks after Eylea injection today  3.  Ophthalmic Meds Ordered this visit:  Meds ordered this encounter  Medications   aflibercept  (EYLEA) SOLN 2 mg       Return in about 7 weeks (around 07/28/2021) for dilate, OS, EYLEA OCT,, follow-up OD as scheduled.  There are no Patient Instructions on file for this visit.   Explained the diagnoses, plan, and follow up with the patient and they expressed understanding.  Patient expressed understanding of the importance of proper follow up care.   Clent Demark Sharne Linders M.D. Diseases & Surgery of the Retina and Vitreous Retina & Diabetic [place] 06/09/21     Abbreviations: M myopia (nearsighted); A astigmatism; H hyperopia (farsighted); P presbyopia; Mrx spectacle prescription;  CTL contact lenses; OD right eye; OS left eye; OU both eyes  XT exotropia; ET esotropia; PEK punctate epithelial keratitis; PEE punctate epithelial erosions; DES dry eye syndrome; MGD meibomian gland dysfunction; ATs artificial tears; PFAT's preservative free artificial tears; [place] nuclear sclerotic cataract; PSC posterior subcapsular cataract; ERM epi-retinal membrane; PVD posterior vitreous detachment; RD retinal detachment; DM diabetes mellitus; DR diabetic retinopathy; NPDR non-proliferative diabetic retinopathy; PDR proliferative diabetic retinopathy; CSME clinically significant macular edema; DME diabetic macular edema; dbh dot blot hemorrhages; CWS cotton wool spot; POAG primary open angle glaucoma; C/D cup-to-disc ratio; HVF humphrey visual field; GVF goldmann visual field; OCT optical coherence tomography; IOP intraocular pressure; BRVO Branch retinal vein occlusion; CRVO central retinal vein occlusion; CRAO central retinal artery occlusion; BRAO branch retinal artery occlusion; RT retinal tear; SB scleral buckle; PPV pars plana vitrectomy; VH Vitreous hemorrhage; PRP panretinal laser photocoagulation; IVK intravitreal kenalog; VMT vitreomacular traction; MH Macular hole;  NVD neovascularization of the disc; NVE neovascularization elsewhere; AREDS age related eye disease study; ARMD age related macular  degeneration; POAG primary open angle glaucoma; EBMD epithelial/anterior basement membrane dystrophy; ACIOL anterior  chamber intraocular lens; IOL intraocular lens; PCIOL posterior chamber intraocular lens; Phaco/IOL phacoemulsification with intraocular lens placement; [place] photorefractive keratectomy; LASIK laser assisted in situ keratomileusis; HTN hypertension; DM diabetes mellitus; COPD chronic obstructive pulmonary disease

## 2021-06-09 NOTE — Assessment & Plan Note (Signed)
OD vastly improved, currently at 7-week follow-up, follow-up as scheduled

## 2021-06-12 ENCOUNTER — Ambulatory Visit (INDEPENDENT_AMBULATORY_CARE_PROVIDER_SITE_OTHER): Payer: HMO

## 2021-06-12 DIAGNOSIS — I10 Essential (primary) hypertension: Secondary | ICD-10-CM

## 2021-06-12 DIAGNOSIS — E1169 Type 2 diabetes mellitus with other specified complication: Secondary | ICD-10-CM

## 2021-06-12 NOTE — Patient Instructions (Signed)
Visit Information ? ?Following are the goals we discussed today:  ? ?Track and Manage My Blood Pressure  ? ?Timeframe:  Long-Range Goal ?Priority:  High ?Start Date:    10/15/20                         ?Expected End Date:   06/2022                 ? ?Follow Up Date 09/2021 ?  ?- check blood pressure daily ?- write blood pressure results in a log or diary ?-Reduce salt in diet ?-Take blood pressure medication (lisinopril-Hctz) every day  ?  ?Why is this important?   ?You won't feel high blood pressure, but it can still hurt your blood vessels.  ?High blood pressure can cause heart or kidney problems. It can also cause a stroke.  ?Making lifestyle changes like losing a little weight or eating less salt will help.  ?Checking your blood pressure at home and at different times of the day can help to control blood pressure.  ?If the doctor prescribes medicine remember to take it the way the doctor ordered.  ?Call the office if you cannot afford the medicine or if there are questions about it. ? ?Plan: Telephone follow up appointment with care management team member scheduled for:  3 months ?The patient has been provided with contact information for the care management team and has been advised to call with any health related questions or concerns.  ? ?Whitney [name], PharmD ?Clinical Pharmacist, [place]  ? ?Please call the care guide team at [phone] if you need to cancel or reschedule your appointment.  ? ?Patient verbalizes understanding of instructions and care plan provided today and agrees to view in [place]. Active MyChart status confirmed with patient.   ? ?

## 2021-06-12 NOTE — Progress Notes (Signed)
Chronic Care Management Pharmacy Note  06/12/2021 Name:  Whitney [name] MRN:  [id number] DOB:  16-Jul-1934  Summary: - Patient reports that blood sugars have been borderline recently, averaging 150-155 in the AM , reports that BG typically lower in PM when she checks -BP elevated from goal averaging 145-155/65-75 -Patient reports compliance to current medications, denies any issues or side effects from current medications   Recommendations/Changes made from today's visit: -Recommending for patient to increase lisinopril-hctz to 1.5 tablets daily  -Recommending for patient to continue with dietary changes to help better control BP and BG - patient to continue monitoring as she is - will reach out should BP >140/90 or BG >150 -Patient scheduled for PCP follow up as she is overdue - would recommend updated A1c and CMP at that time   Subjective: Whitney [name] is an 87 y.o. year old female who is a primary patient of [name], MD.  The CCM team was consulted for assistance with disease management and care coordination needs.    Engaged with patient by telephone for follow up visit in response to provider referral for pharmacy case management and/or care coordination services.   Consent to Services:  The patient was given information about Chronic Care Management services, agreed to services, and gave verbal consent prior to initiation of services.  Please see initial visit note for detailed documentation.   Patient Care Team: [name], MD as PCP - General (Internal Medicine) Noralee Space, MD as Consulting Physician (Pulmonary Disease) Gean Birchwood, DPM as Consulting Physician (Podiatry) Knox Royalty, RN as Case Manager Jaycee Mckellips, Darnelle Maffucci, [facility] (Pharmacist)  Pt has lived here her whole life, she has 4 children, 4 grandchildren, 3 great-grandchildren. Patient lives alone; she takes care of house work.  Recent office visits: None since last visit    Recent consult visits: 06/09/2021 - Dr. Zadie Rhine  - Ophthalmology - eylea injection given - continue 7 week interval injections  04/21/2021 - Dr. Zadie Rhine - avastin injection given - follow up in 7 weeks  04/20/2021 - Dr. Zadie Rhine  - Ophthalmology - eylea injection given  - follow up in 8 weeks   Hospital visits: None in previous 6 months   Objective:  Lab Results  Component Value Date   CREATININE 0.81 03/17/2020   BUN 11 03/17/2020   GFR 67.28 03/17/2020   GFRNONAA 87.17 02/16/2010   GFRAA 156 02/19/2008   NA 138 03/17/2020   K 3.8 03/17/2020   CALCIUM 10.6 (H) 03/17/2020   CO2 29 03/17/2020   GLUCOSE 272 (H) 03/17/2020    Lab Results  Component Value Date/Time   HGBA1C 7.7 (A) 10/22/2020 10:29 AM   HGBA1C 7.9 (H) 03/17/2020 09:50 AM   HGBA1C 7.5 (H) 09/04/2019 02:40 PM   GFR 67.28 03/17/2020 09:50 AM   GFR 86.72 09/04/2019 02:40 PM   MICROALBUR 2.0 (H) 05/19/2015 09:21 AM   MICROALBUR 2.5 (H) 05/14/2014 10:01 AM    Last diabetic Eye exam:  Lab Results  Component Value Date/Time   HMDIABEYEEXA Retinopathy (A) 01/07/2021 12:00 AM    Last diabetic Foot exam: No results found for: HMDIABFOOTEX   Lab Results  Component Value Date   CHOL 145 03/17/2020   HDL 40.50 03/17/2020   LDLCALC 80 03/17/2020   TRIG 124.0 03/17/2020   CHOLHDL 4 03/17/2020    Hepatic Function Latest Ref Rng & Units 03/17/2020 09/04/2019 09/29/2018  Total Protein 6.0 - 8.3 g/dL 8.0 8.0 7.4  Albumin 3.5 -  5.2 g/dL 4.1 4.1 3.9  AST 0 - 37 U/L '21 19 19  ' ALT 0 - 35 U/L '18 16 17  ' Alk Phosphatase 39 - 117 U/L 45 54 47  Total Bilirubin 0.2 - 1.2 mg/dL 1.0 1.1 0.7  Bilirubin, Direct 0.0 - 0.3 mg/dL - - -    Lab Results  Component Value Date/Time   TSH 1.13 09/14/2013 09:59 AM   TSH 1.22 08/23/2012 10:19 AM    CBC Latest Ref Rng & Units 03/17/2020 09/04/2019 09/29/2018  WBC 4.0 - 10.5 K/uL 5.4 7.5 6.5  Hemoglobin 12.0 - 15.0 g/dL 12.9 12.5 12.4  Hematocrit 36.0 - 46.0 % 39.7 38.0 37.4  Platelets  150.0 - 400.0 K/uL 222.0 199.0 193.0    Lab Results  Component Value Date/Time   VD25OH 66 08/23/2011 10:26 AM   VD25OH 40 08/18/2009 07:43 PM    Clinical ASCVD: No  The ASCVD Risk score (Arnett DK, et al., 2019) failed to calculate for the following reasons:   The 2019 ASCVD risk score is only valid for ages 51 to 50    Depression screen PHQ 2/9 01/09/2021 10/22/2020 10/08/2019  Decreased Interest 0 0 0  Down, Depressed, Hopeless 0 0 0  PHQ - 2 Score 0 0 0  Altered sleeping - - -  Tired, decreased energy - - -  Change in appetite - - -  Feeling bad or failure about yourself  - - -  Trouble concentrating - - -  Moving slowly or fidgety/restless - - -  Suicidal thoughts - - -  PHQ-9 Score - - -  Difficult doing work/chores - - -  Some recent data might be hidden     Social History   Tobacco Use  Smoking Status Never  Smokeless Tobacco Never   BP Readings from Last 3 Encounters:  10/22/20 130/78  05/15/20 125/71  03/17/20 (!) 168/80   Pulse Readings from Last 3 Encounters:  10/22/20 71  05/15/20 83  03/17/20 62   Wt Readings from Last 3 Encounters:  01/09/21 150 lb (68 kg)  10/22/20 148 lb 12.8 oz (67.5 kg)  05/15/20 154 lb (69.9 kg)   BMI Readings from Last 3 Encounters:  01/09/21 28.34 kg/m  10/22/20 28.12 kg/m  05/15/20 29.10 kg/m    Assessment/Interventions: Review of patient past medical history, allergies, medications, health status, including review of consultants reports, laboratory and other test data, was performed as part of comprehensive evaluation and provision of chronic care management services.   SDOH:  (Social Determinants of Health) assessments and interventions performed: Yes  SDOH Screenings   Alcohol Screen: Not on file  Depression (PHQ2-9): Low Risk    PHQ-2 Score: 0  Financial Resource Strain: Not on file  Food Insecurity: No Food Insecurity   Worried About Charity fundraiser in the Last Year: Never true   Ran Out of Food in  the Last Year: Never true  Housing: Low Risk    Last Housing Risk Score: 0  Physical Activity: Not on file  Social Connections: Not on file  Stress: Not on file  Tobacco Use: Low Risk    Smoking Tobacco Use: Never   Smokeless Tobacco Use: Never   Passive Exposure: Not on file  Transportation Needs: No Transportation Needs   Lack of Transportation (Medical): No   Lack of Transportation (Non-Medical): No    CCM Care Plan  No Known Allergies  Medications Reviewed Today     Reviewed by Tomasa Blase, [facility] (Pharmacist)  on 06/12/21 at 1441  Med List Status: <None>   Medication Order Taking? Sig Documenting Provider Last Dose Status Informant  Ascorbic Acid (VITAMIN C) 100 MG tablet [id number] Yes Take 100 mg by mouth daily. [provider] Taking Active   aspirin 81 MG tablet [id number] Yes Take 1 tablet (81 mg total) by mouth daily. [name], MD Taking Active   blood glucose meter kit and supplies KIT [id number] Yes Use to test blood sugar up to 3 times daily. DX E11.9 [name], MD Taking Active   Calcium Carb-Cholecalciferol (CALCIUM-VITAMIN D) 500-200 MG-UNIT tablet [id number] Yes Take 1 tablet by mouth 2 (two) times daily with a meal. [name], MD Taking Active   diclofenac Sodium (VOLTAREN) 1 % GEL [id number] Yes Apply 4 g topically 4 (four) times daily. [name], MD Taking Active   dorzolamide-timolol (COSOPT) 22.3-6.8 MG/ML ophthalmic solution [id number] Yes Instill 1 drop in each eye twice a day [name], MD Taking Active   DUREZOL 0.05 % EMUL [id number] Yes  [provider] Taking Active            Med Note Izola Price, AMY R   Mon Mar 17, 2015  9:44 AM) Received from: External Pharmacy  empagliflozin (JARDIANCE) 25 MG TABS tablet [id number] Yes Take 1 tablet (25 mg total) by mouth daily before breakfast. [name], MD Taking Active   glimepiride (AMARYL) 4 MG tablet [id number] Yes TAKE 1 TABLET  EVERY DAY BEFORE BREAKFAST [name], MD Taking Active   Lancet Devices [facility]) lancets [id number] Yes Use to test blood sugar up to 3 times a day. DX E11.9 [name], MD Taking Active   lisinopril-hydrochlorothiazide (ZESTORETIC) 20-12.5 MG tablet [id number] Yes Take 1 tablet by mouth daily. [name], MD Taking Active   metFORMIN (GLUCOPHAGE) 1000 MG tablet [id number] Yes Take 1 tablet (1,000 mg total) by mouth 2 (two) times daily with a meal. [name], MD Taking Active   Multiple Vitamin (MULTI VITAMIN DAILY PO) [id number] Yes Take one by mouth daily [provider] Taking Active            Med Note Delice Bison, Darnelle Maffucci   Fri Jun 12, 2021  2:41 PM)    [facility] VERIO test strip [id number] Yes Use to test blood sugar 3 times a day [name], MD Taking Active   simvastatin (ZOCOR) 20 MG tablet [id number] Yes Take 1 tablet (20 mg total) by mouth at bedtime. [name], MD Taking Active   zinc gluconate 50 MG tablet [id number] Yes Take 50 mg by mouth daily. [provider] Taking Active             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 associated with type 2 diabetes mellitus ([place]) 08/13/2019   Moderate nonproliferative diabetic retinopathy of right eye with macular edema ([facility]) 08/13/2019   Type 2 macular telangiectasis, left 08/13/2019   Retinal telangiectasia of right eye 08/13/2019   OSA on CPAP 07/20/2017   Venous insufficiency 04/26/2017   Sensorineural hearing loss (SNHL), bilateral 12/08/2016   Routine general medical examination at a health care facility 05/25/2016   Type 2 diabetes mellitus with hyperlipidemia ([place]) 02/23/2012   Obesity 08/23/2011   GLAUCOMA 08/24/2009   Left shoulder pain 02/19/2008   Hyperlipidemia associated with type 2 diabetes mellitus ([place])  12/22/2006  Essential hypertension 12/22/2006   GERD 12/22/2006   DISC DISEASE, CERVICAL 12/22/2006   Osteopenia 12/22/2006   Other chest pain 12/22/2006    Immunization History  Administered Date(s) Administered   Fluad Quad(high Dose 65+) 03/30/2019, 03/17/2020   Influenza Split 02/18/2011, 02/23/2012   Influenza Whole 01/19/2005, 02/17/2009, 02/16/2010   Influenza, High Dose Seasonal PF 02/14/2015, 04/21/2017, 02/02/2018   Influenza,inj,Quad PF,6+ Mos 03/12/2013, 02/07/2014   PFIZER(Purple Top)SARS-COV-2 Vaccination 05/04/2019, 05/28/2019, 01/29/2020   Pneumococcal Conjugate-13 05/25/2016   Pneumococcal Polysaccharide-23 04/12/1998, 02/16/2010   Td 08/21/2002   Tdap 08/23/2011    Conditions to be addressed/monitored:  Hypertension, Hyperlipidemia, and Diabetes  Care Plan : [place]  Updates made by Tomasa Blase, RPH since 06/12/2021 12:00 AM     Problem: Hypertension, Hyperlipidemia, and Diabetes   Priority: High     Long-Range Goal: Disease management   Start Date: 10/16/2020  Expected End Date: 06/13/2022  This Visit's Progress: Not on track  Recent Progress: On track  Priority: High  Note:   Current Barriers:  Unable to independently monitor therapeutic efficacy Unable to maintain control of BP, DM  Pharmacist Clinical Goal(s):  Patient will achieve adherence to monitoring guidelines and medication adherence to achieve therapeutic efficacy adhere to plan to optimize therapeutic regimen for BP, DM as evidenced by report of adherence to recommended medication management changes through collaboration with PharmD and provider.   Interventions: 1:1 collaboration with [name], MD regarding development and update of comprehensive plan of care as evidenced by provider attestation and co-signature Inter-disciplinary care team collaboration (see longitudinal plan of care) Comprehensive medication review performed; medication list updated in  electronic medical record  Hypertension    BP goal is:  <140/90 BP control has improved  Patient checks BP at home daily Patient home BP readings are ranging: 148/70, 144/65, 141/72, 152/78 Patient has failed these meds in the past: n/a Patient is currently controlled on the following medications: Lisinopril-HCTZ 20-12.5 mg daily AM   Patient denies sodium intake, reports that she does not have any salt in her house, does not to drinking 1.5 cups of coffee in the AM, and checks BP in AM, recommended reducing caffeine intake as well as checking BP as time later in the day to assess BP control without influence of caffeine - agreeable - will reach out should BP average >140/90   Plan: Increase lisinopril-hctz 20-12.29m to 1.5 tablets daily    Hyperlipidemia   Controlled  LDL goal < 100 Lab Results  Component Value Date   L[place]80 03/17/2020  Patient has failed these meds in past: n/a Patient is currently controlled on the following medications: Simvastatin 20 mg HS Aspirin 81 mg daily   We discussed:  pt reports compliance with medications as above; LDL is at goal; pt endorses daily exercise and low cholesterol diet   Plan: Continue current medications   Diabetes  A1c goal <8%  Lab Results  Component Value Date   HGBA1C 7.7 (A) 10/22/2020  Checking BG: Daily Recent FBG Readings: 138,155 158, 156, 88 Started following with nutrition 01/09/2021 - patient has been making changes to diet and watching portion size Patient has failed these meds in past: pioglitazone Patient is currently controlled on the following medications: Metformin 1000 mg BID Glimepiride 4 mg daily AM (started ~2012) Jardiance 25 mg daily AM Testing supplies -Patient has started following with nutrition, notes that she has improved her portion contorl Plan-Patient to continue current medications - possible in future  if more glycemic control is necessary that she would benefit from rybelsus - but as BG  has improved at this time, no further changes  -recommend A1c be checked with next PCP appointment   Patient Goals/Self-Care Activities Patient will:  - take medications as prescribed focus on medication adherence by pill box check glucose daily, document, and provide at future appointments check blood pressure daily, document, and provide at future appointments target a minimum of 150 minutes of moderate intensity exercise weekly engage in dietary modifications by reducing carb/sugar        Medication Assistance: None required.  Patient affirms current coverage meets needs.  Compliance/Adherence/Medication fill history: Care Gaps: Shingrix Covid booster (due 05/31/20)  Patient's preferred pharmacy is:  [facility] [address] [place] [address] Phone: [phone] Fax: [facility], [address] [place] [address] Phone: [phone] Fax: [phone]   Uses pill box? Yes Pt endorses 100% compliance  We discussed: Current pharmacy is preferred with insurance plan and patient is satisfied with pharmacy services Patient decided to: Continue current medication management strategy  Care Plan and Follow Up Patient Decision:  Patient agrees to Care Plan and Follow-up.  Plan: Telephone follow up appointment with care management team member scheduled for:  3 months  Tomasa Blase, PharmD Clinical Pharmacist, [place]

## 2021-06-15 ENCOUNTER — Ambulatory Visit: Payer: HMO | Admitting: *Deleted

## 2021-06-15 DIAGNOSIS — I10 Essential (primary) hypertension: Secondary | ICD-10-CM

## 2021-06-15 DIAGNOSIS — E785 Hyperlipidemia, unspecified: Secondary | ICD-10-CM

## 2021-06-15 DIAGNOSIS — E1169 Type 2 diabetes mellitus with other specified complication: Secondary | ICD-10-CM

## 2021-06-15 NOTE — Patient Instructions (Signed)
Visit Information ? ?Whitney [name], thank you for taking time to talk with me today. Please don't hesitate to contact me if I can be of assistance to you before our next scheduled telephone appointment. ? ?Below are the goals we discussed today:  ?Patient Self-Care Activities: ?Patient Whitney [name] will: ?Take medications as prescribed ?Attend all scheduled provider appointments ?Call pharmacy for medication refills ?Call provider office for new concerns or questions ?Continue to check fasting (first thing in the morning, before eating) blood sugars at home: your goal for fasting blood sugars are between 130-140 ?Continue to occassionally check after-eating blood sugars 2 hours after a meal: your goal for after eating blood sugars are between 160-170 ?Continue to follow heart healthy, low salt, low cholesterol, carbohydrate-modified, low sugar diet: keep up the good work trying to make sure you control your portions ?Continue staying as active as possible ?Keep up the great work making effort to prevent falls ? ?Our next scheduled telephone follow up visit/ appointment is scheduled on:   Friday, July 24, 2021 at 11:30 am- This is a PHONE [place] appointment ? ?If you need to cancel or re-schedule our visit, please call [phone] and our care guide team will be happy to assist you. ?  ?I look forward to hearing about your progress. ?  ?[name], RN, BSN, CCRN Alumnus ?[facility] ?([phone]: direct office ? ?If you are experiencing a Mental Health or [place] or need someone to talk to, please  ?call the Suicide and Crisis Lifeline: 988 ?call the [place] National Suicide Prevention Lifeline: 254-391-4603 or TTY: (616)027-6367 TTY (302) 488-2039) to talk to a trained counselor ?call 1-800-273-TALK (toll free, 24 hour hotline) ?go to [facility] [address] [place] [id number]) ?call 911  ? ?The patient verbalized  understanding of instructions, educational materials, and care plan provided today and agreed to receive a mailed copy of patient instructions, educational materials, and care plan ? ?Hemoglobin A1C Test ?Why am I having this test? ?You may have the hemoglobin A1C test (A1C test) done to: ?Evaluate your risk for developing diabetes (diabetes mellitus). ?Diagnose diabetes. ?Monitor long-term control of blood sugar (glucose) in people who have diabetes and help make treatment decisions. ?This test may be done with other blood glucose tests, such as fasting blood glucose and oral glucose tolerance tests. ?What is being tested? ?Hemoglobin is a type of protein in the blood that carries oxygen. Glucose attaches to hemoglobin to form glycated hemoglobin. This test checks the amount of glycated hemoglobin in your blood, which is a good indicator of the average amount of glucose in your blood during the past 2-3 months. ?What kind of sample is taken? ?A blood sample is required for this test. It is usually collected by inserting a needle into a blood vessel. ?Tell a health care provider about: ?All medicines you are taking, including vitamins, herbs, eye drops, creams, and over-the-counter medicines. ?Any blood disorders you have. ?Any surgeries you have had. ?Any medical conditions you have. ?Whether you are pregnant or may be pregnant. ?How are the results reported? ?Your results will be reported as a percentage that indicates how much of your hemoglobin has glucose attached to it (is glycated). Your health care provider will compare your results to normal ranges that were established after testing a large group of people (reference ranges). Reference ranges may vary among labs and hospitals. For this test, common reference ranges are: ?Adult or child without  diabetes: 4-5.6%. ?Adult or child with diabetes and good blood glucose control: less than 7%. ?What do the results mean? ?If you have diabetes: ?A result of less  than 7% is considered normal, meaning that your blood glucose is well controlled. ?A result higher than 7% means that your blood glucose is not well controlled, and your treatment plan may need to be adjusted. ?If you do not have diabetes: ?A result within the reference range is considered normal, meaning that you are not at high risk for diabetes. ?A result of 5.7-6.4% means that you have a high risk of developing diabetes, and you have prediabetes. Prediabetes is the condition of having a blood glucose level that is higher than it should be but not high enough for you to be diagnosed with diabetes. Having prediabetes puts you at risk for developing type 2 diabetes. You may have more tests, including a repeat A1C test. ?Results of 6.5% or higher on two separate A1C tests mean that you have diabetes. You may have more tests to confirm the diagnosis. ?Abnormally low A1C values may be caused by: ?Pregnancy. ?Severe blood loss. ?Receiving donated blood (transfusions). ?Low red blood cell count (anemia). ?Long-term kidney failure. ?Some unusual forms (variants) of hemoglobin. ?Talk with your health care provider about what your results mean. ?Questions to ask your health care provider ?Ask your health care provider, or the department that is doing the test: ?When will my results be ready? ?How will I get my results? ?What are my treatment options? ?What other tests do I need? ?What are my next steps? ?Summary ?The A1C test may be done to evaluate your risk for developing diabetes, to diagnose diabetes, and to monitor long-term control of blood sugar (glucose) in people who have diabetes and help make treatment decisions. ?Hemoglobin is a type of protein in the blood that carries oxygen. Glucose attaches to hemoglobin to form glycated hemoglobin. This test checks the amount of glycated hemoglobin in your blood, which is a good indicator of the average amount of glucose in your blood during the past 2-3 months. ?Talk with  your health care provider about what your results mean. ?This information is not intended to replace advice given to you by your health care provider. Make sure you discuss any questions you have with your health care provider. ?Document Revised: 12/26/2019 Document Reviewed: 12/26/2019 ?Elsevier Patient Education ? 2022 [place]. ? ? ? ?

## 2021-06-15 NOTE — Chronic Care Management (AMB) (Signed)
Chronic Care Management   CCM RN Visit Note  06/15/2021 Name: Whitney [name] MRN: [id number] DOB: May 04, 1934  Subjective: Whitney [name] is a 87 y.o. year old female who is a primary care patient of [name], MD. The care management team was consulted for assistance with disease management and care coordination needs.    Engaged with patient by telephone for follow up visit in response to provider referral for case management and/or care coordination services.   Consent to Services:  The patient was given information about Chronic Care Management services, agreed to services, and gave verbal consent prior to initiation of services.  Please see initial visit note for detailed documentation.  Patient agreed to services and verbal consent obtained.   Assessment: Review of patient past medical history, allergies, medications, health status, including review of consultants reports, laboratory and other test data, was performed as part of comprehensive evaluation and provision of chronic care management services.   SDOH (Social Determinants of Health) assessments and interventions performed:  SDOH Interventions    Flowsheet Row Most Recent Value  SDOH Interventions   Food Insecurity Interventions Intervention Not Indicated  [denies food insecurity]  Transportation Interventions Intervention Not Indicated  [Patient reports family continues to provide transportation]     CCM Care Plan  No Known Allergies  Outpatient Encounter Medications as of 06/15/2021  Medication Sig Note   Ascorbic Acid (VITAMIN C) 100 MG tablet Take 100 mg by mouth daily.    aspirin 81 MG tablet Take 1 tablet (81 mg total) by mouth daily.    blood glucose meter kit and supplies KIT Use to test blood sugar up to 3 times daily. DX E11.9    Calcium Carb-Cholecalciferol (CALCIUM-VITAMIN D) 500-200 MG-UNIT tablet Take 1 tablet by mouth 2 (two) times daily with a meal.    diclofenac Sodium (VOLTAREN) 1 % GEL  Apply 4 g topically 4 (four) times daily.    dorzolamide-timolol (COSOPT) 22.3-6.8 MG/ML ophthalmic solution Instill 1 drop in each eye twice a day    DUREZOL 0.05 % EMUL  03/17/2015: Received from: External Pharmacy   empagliflozin (JARDIANCE) 25 MG TABS tablet Take 1 tablet (25 mg total) by mouth daily before breakfast.    glimepiride (AMARYL) 4 MG tablet TAKE 1 TABLET EVERY DAY BEFORE BREAKFAST    Lancet Devices (ACCU-CHEK SOFTCLIX) lancets Use to test blood sugar up to 3 times a day. DX E11.9    lisinopril-hydrochlorothiazide (ZESTORETIC) 20-12.5 MG tablet Take 1 tablet by mouth daily.    metFORMIN (GLUCOPHAGE) 1000 MG tablet Take 1 tablet (1,000 mg total) by mouth 2 (two) times daily with a meal.    Multiple Vitamin (MULTI VITAMIN DAILY PO) Take one by mouth daily    ONETOUCH VERIO test strip Use to test blood sugar 3 times a day    simvastatin (ZOCOR) 20 MG tablet Take 1 tablet (20 mg total) by mouth at bedtime.    zinc gluconate 50 MG tablet Take 50 mg by mouth daily.    No facility-administered encounter medications on file as of 06/15/2021.   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 associated with type 2 diabetes mellitus ([place]) 08/13/2019   Moderate nonproliferative diabetic retinopathy of right eye with macular edema (HCC) 08/13/2019   Type 2 macular telangiectasis, left 08/13/2019   Retinal telangiectasia of right eye 08/13/2019   OSA on CPAP 07/20/2017  Venous insufficiency 04/26/2017   Sensorineural hearing loss (SNHL), bilateral 12/08/2016   Routine general medical examination at a health care facility 05/25/2016   Type 2 diabetes mellitus with hyperlipidemia ([place]) 02/23/2012   Obesity 08/23/2011   GLAUCOMA 08/24/2009   Left shoulder pain 02/19/2008   Hyperlipidemia associated with type 2 diabetes mellitus ([place]) 12/22/2006   Essential hypertension 12/22/2006    GERD 12/22/2006   DISC DISEASE, CERVICAL 12/22/2006   Osteopenia 12/22/2006   Other chest pain 12/22/2006   Conditions to be addressed/monitored:  HTN, HLD, and DMII  Care Plan : RN Care Manager Plan of Care  Updates made by Knox Royalty, RN since 06/15/2021 12:00 AM     Problem: Chronic Disease Management Needs   Priority: High     Long-Range Goal: Development of plan of care for long term chronic disease management   Start Date: 02/25/2021  Expected End Date: 02/25/2022  Priority: High  Note:   Current Barriers:  Chronic Disease Management support and education needs related to HLD and DMII Hard of hearing- lost hearing aid for (L) ear- verbalizes plans to replace in coming months; 06/15/21-- reports has not yet replaced; "still looking into it" Retinal issues- sees specialist for eye injections; occasional issues with reading; able to drive short distances; family assists with driving long distances    RNCM Clinical Goal(s):  Patient will demonstrate Ongoing health management independence DMII; HLD  through collaboration with RN Care manager, provider, and care team.   Interventions: 1:1 collaboration with primary care provider regarding development and update of comprehensive plan of care as evidenced by provider attestation and co-signature Inter-disciplinary care team collaboration (see longitudinal plan of care) Evaluation of current treatment plan related to  self management and patient's adherence to plan as established by provider 03/19/21- Initial assessment completed 06/15/21: Discussed current clinical condition: patient reports overall well; denies concerns/ complaints SDOH updated: no new/ unmet needs identified Pain assessment updated: denies acute/ chronic pain today; reports ongoing intermittent knee pain due to OA- well controlled by use of voltaren gel Falls assessment updated: continues to deny falls x 12 months- does not use assistive devices; positive  reinforcement provided with encouragement to continue efforts; previously provided education around fall risks/ prevention reinforced Confirmed no medication concerns/ questions/ issues: today she confirms she has increased dosing of blood pressure medication (Lisinopril/ HCTZ) as advised by CCM Pharmacist 06/12/21- now taking 1 and 1/2 pill QD; has not yet seen a significant decrease in blood pressures since increasing dose; denies concerns with side effects (dizziness/ lightheadedness) Reviewed recent provider appointments: continues to get regular/ monthly eye injections- reports "my eyes are doing better" since she has been receiving eye injections Reviewed upcoming scheduled provider appointments: 06/16/21- eye injection/ Dr. Zadie Rhine; 07/02/21- neuro/ CPAP provider; 07/21/21- PCP  Hyperlipidemia/ HTN Interventions:  GOAL STATUS: 06/15/21: Goal on track; Long Term goal Reviewed importance of limiting foods high in cholesterol Reviewed exercise goals and target of 150 minutes per week Confirmed uses CPAP unit at night as prescribed; no issues reported Confirmed patient continues to get regular exercise: follows an exercise program on TV through [facility]: reports tries to exercise 5 times per week; positive reinforcement provided with encouragement to continue efforts Confirmed patient continues monitoring/ recording blood pressures at home: she does not have specific values to share with me today; her BP log is not near her; states "they are all pretty good"  Diabetes Interventions: GOAL STATUS: 06/15/21: Goal on track; Long Term Goal Assessed patient's understanding  of A1c goal: <7% Provided education to patient about basic DM disease process Counseled on importance of regular laboratory monitoring as prescribed Discussed plans with patient for ongoing care management follow up and provided patient with direct contact information for care management team Review of patient status, including review  of consultants reports, relevant laboratory and other test results, and medications completed Confirmed patient continues to monitor/ record blood sugars at home BID-TID: reports consistent fasting blood sugar values at home between 120-170: reports consistent post-prandial blood sugars between 90-150 Reinforced previously provided education around general goals for blood sugar values at home: between 120-130 fasting; between 160-170 post-prandial Assessed patient's understanding of meaning/ significance of A1-C values: ongoing good baseline understanding of same, however, patient will benefit from ongoing reinforcement/ education/ support- Reviewed individual historical A1-C trends and provided education around correlation of A1-C value to blood sugar levels at home over 3 months Reviewed last recorded lab values for A1-C and kidney function and provided education around normal values Discussed with patient potential complications/ risks of high blood sugar: she is very engaged and motivated to know as much as possible to control her health Confirmed patient tries to follow heart healthy, low sodium, carbohydrate modified, low sugar diet- positive reinforcement provided with encouragement to continue efforts; discussed/ provided education around concept of portion control-- patient reports continues "trying to eat as healthy as I can;" positive reinforcement provided with encouragement to continue efforts  Lab Results  Component Value Date   HGBA1C 7.7 (A) 10/22/2020  Patient Goals/Self-Care Activities: As evidenced by review of EHR, collaboration with care team, and patient reporting during CCM RN CM outreach,  Patient Whitney [name] will: Take medications as prescribed Attend all scheduled provider appointments Call pharmacy for medication refills Call provider office for new concerns or questions Continue to check fasting (first thing in the morning, before eating) blood sugars at home: your goal for  fasting blood sugars are between 130-140 Continue to occassionally check after-eating blood sugars 2 hours after a meal: your goal for after eating blood sugars are between 160-170 Continue to follow heart healthy, low salt, low cholesterol, carbohydrate-modified, low sugar diet: keep up the good work trying to make sure you control your portions Continue staying as active as possible Keep up the great work making effort to prevent falls  Follow Up Plan:   Telephone follow up appointment with care management team member scheduled for:  Friday, July 24, 2021 at 11:30 am The patient has been provided with contact information for the care management team and has been advised to call with any health related questions or concerns    Plan: The patient has been provided with contact information for the care management team and has been advised to call with any health related questions or concerns  [name], RN, BSN, [place] [phone]: direct office

## 2021-06-16 ENCOUNTER — Other Ambulatory Visit: Payer: Self-pay

## 2021-06-16 ENCOUNTER — Encounter (INDEPENDENT_AMBULATORY_CARE_PROVIDER_SITE_OTHER): Payer: Self-pay | Admitting: Ophthalmology

## 2021-06-16 ENCOUNTER — Ambulatory Visit (INDEPENDENT_AMBULATORY_CARE_PROVIDER_SITE_OTHER): Payer: HMO | Admitting: Ophthalmology

## 2021-06-16 DIAGNOSIS — E113311 Type 2 diabetes mellitus with moderate nonproliferative diabetic retinopathy with macular edema, right eye: Secondary | ICD-10-CM | POA: Diagnosis not present

## 2021-06-16 DIAGNOSIS — E113312 Type 2 diabetes mellitus with moderate nonproliferative diabetic retinopathy with macular edema, left eye: Secondary | ICD-10-CM | POA: Diagnosis not present

## 2021-06-16 MED ORDER — BEVACIZUMAB 2.5 MG/0.1ML IZ SOSY
2.5000 mg | PREFILLED_SYRINGE | INTRAVITREAL | Status: AC | PRN
  Administered 2021-06-16: 2.5 mg via INTRAVITREAL

## 2021-06-16 NOTE — Progress Notes (Signed)
06/16/2021     CHIEF COMPLAINT Patient presents for  Chief Complaint  Patient presents with   Diabetic Retinopathy with Macular Edema      HISTORY OF PRESENT ILLNESS: Whitney [name] is a 87 y.o. female who presents to the clinic today for:   HPI   8 weeks dilate OD, Avastin OD. Patient states vision is stable and unchanged since last visit. Denies any new floaters or FOL. Pt is using Dorz-tim BID OU. Last edited by Laurin Coder on 06/16/2021 11:03 AM.      Referring physician: [name], MD [place],  [place] [id number]  HISTORICAL INFORMATION:   Selected notes from the MEDICAL RECORD NUMBER    Lab Results  Component Value Date   HGBA1C 7.7 (A) 10/22/2020     CURRENT MEDICATIONS: Current Outpatient Medications (Ophthalmic Drugs)  Medication Sig   dorzolamide-timolol (COSOPT) 22.3-6.8 MG/ML ophthalmic solution Instill 1 drop in each eye twice a day   DUREZOL 0.05 % EMUL    No current facility-administered medications for this visit. (Ophthalmic Drugs)   Current Outpatient Medications (Other)  Medication Sig   Ascorbic Acid (VITAMIN C) 100 MG tablet Take 100 mg by mouth daily.   aspirin 81 MG tablet Take 1 tablet (81 mg total) by mouth daily.   blood glucose meter kit and supplies KIT Use to test blood sugar up to 3 times daily. DX E11.9   Calcium Carb-Cholecalciferol (CALCIUM-VITAMIN D) 500-200 MG-UNIT tablet Take 1 tablet by mouth 2 (two) times daily with a meal.   diclofenac Sodium (VOLTAREN) 1 % GEL Apply 4 g topically 4 (four) times daily.   empagliflozin (JARDIANCE) 25 MG TABS tablet Take 1 tablet (25 mg total) by mouth daily before breakfast.   glimepiride (AMARYL) 4 MG tablet TAKE 1 TABLET EVERY DAY BEFORE BREAKFAST   Lancet Devices (ACCU-CHEK SOFTCLIX) lancets Use to test blood sugar up to 3 times a day. DX E11.9   lisinopril-hydrochlorothiazide (ZESTORETIC) 20-12.5 MG tablet Take 1 tablet by mouth daily.   metFORMIN  (GLUCOPHAGE) 1000 MG tablet Take 1 tablet (1,000 mg total) by mouth 2 (two) times daily with a meal.   Multiple Vitamin (MULTI VITAMIN DAILY PO) Take one by mouth daily   ONETOUCH VERIO test strip Use to test blood sugar 3 times a day   simvastatin (ZOCOR) 20 MG tablet Take 1 tablet (20 mg total) by mouth at bedtime.   zinc gluconate 50 MG tablet Take 50 mg by mouth daily.   No current facility-administered medications for this visit. (Other)      REVIEW OF SYSTEMS: ROS   Negative for: Constitutional, Gastrointestinal, Neurological, Skin, Genitourinary, Musculoskeletal, HENT, Endocrine, Cardiovascular, Eyes, Respiratory, Psychiatric, Allergic/Imm, Heme/Lymph Last edited by Hurman Horn, MD on 06/16/2021 12:02 PM.       ALLERGIES No Known Allergies  PAST MEDICAL HISTORY Past Medical History:  Diagnosis Date   Anxiety    Baker's cyst    Chest pain    Diabetes mellitus type 2, uncontrolled    Diabetes mellitus without complication (HCC)    Disc disease, degenerative, cervical    Diverticulosis of colon    DJD (degenerative joint disease) of knee    Facial pain, atypical    GERD (gastroesophageal reflux disease)    Glaucoma    Hypercholesteremia    Hypertension    Lumbar spondylosis    Obesity    Onychomycosis    Osteopenia    Past Surgical History:  Procedure Laterality Date   right eye surgery  09/2010   retina in right eye   VAGINAL HYSTERECTOMY  2002   Dr. Kem Kays repair    FAMILY HISTORY Family History  Problem Relation Age of Onset   Diabetes Mother    Colon cancer Neg Hx    Stomach cancer Neg Hx     SOCIAL HISTORY Social History   Tobacco Use   Smoking status: Never   Smokeless tobacco: Never  Vaping Use   Vaping Use: Never used  Substance Use Topics   Alcohol use: No   Drug use: No         OPHTHALMIC EXAM:  Base Eye Exam     Visual Acuity (ETDRS)       Right Left   Dist cc 20/30 -1 20/25 -2    Correction: Glasses          Tonometry (Tonopen, 11:07 AM)       Right Left   Pressure 17 12         Pupils       Shape React APD   Right Irregular Minimal None   Left Irregular Minimal None         Extraocular Movement       Right Left    Full Full         Neuro/Psych     Oriented x3: Yes   Mood/Affect: Normal         Dilation     Right eye: 1.0% Mydriacyl, 2.5% Phenylephrine @ 11:07 AM           Slit Lamp and Fundus Exam     External Exam       Right Left   External Normal Normal         Slit Lamp Exam       Right Left   Lids/Lashes Normal Normal   Conjunctiva/Sclera White and quiet White and quiet   Cornea Clear Clear   Anterior Chamber Deep and quiet Deep and quiet   Iris Round and reactive Round and reactive   Lens Posterior chamber intraocular lens Posterior chamber intraocular lens   Anterior Vitreous Normal Normal         Fundus Exam       Right Left   Posterior Vitreous Normal    Disc Normal    C/D Ratio 0.55    Macula Microaneurysms,, trace clinical CSME    Vessels NPDR, moderate    Periphery Normal             IMAGING AND PROCEDURES  Imaging and Procedures for 06/16/21  Intravitreal Injection, Pharmacologic Agent - OD - Right Eye       Time Out 06/16/2021. 12:03 PM. Confirmed correct patient, procedure, site, and patient consented.   Anesthesia Topical anesthesia was used. Anesthetic medications included Lidocaine 4%.   Procedure Preparation included 10% betadine to eyelids, 5% betadine to ocular surface, Ofloxacin . A 30 gauge needle was used.   Injection: 2.5 mg bevacizumab 2.5 MG/0.1ML   Route: Intravitreal, Site: Right Eye   NDC: [id number], Lot: [id number]   Post-op Post injection exam found visual acuity of at least counting fingers. The patient tolerated the procedure well. There were no complications. The patient received written and verbal post procedure care education. Post injection medications included ocuflox.       OCT, Retina - OU - Both Eyes       Right Eye Quality was good. Scan locations included subfoveal. Central  Foveal Thickness: 282. Progression has improved. Findings include abnormal foveal contour, cystoid macular edema.   Left Eye Quality was good. Scan locations included subfoveal. Central Foveal Thickness: 232. Progression has improved. Findings include abnormal foveal contour, cystoid macular edema.   Notes OS vastly improved 1-week and 1 day post injection antivegF for center involved CSME.,   OD, overall improved as well region superotemporal today at 8-week follow-up, which will be chronic with cavitary old CME.             ASSESSMENT/PLAN:  Moderate nonproliferative diabetic retinopathy of right eye with macular edema (HCC) OD today at 8-week follow-up, still vastly improved macular thickening, will repeat injection today and maintain a week follow-up  Moderate nonproliferative diabetic retinopathy of left eye with macular edema associated with type 2 diabetes mellitus ([place]) OS today much improved temporally 1 week post most recent injection follow-up as scheduled     ICD-10-CM   1. Moderate nonproliferative diabetic retinopathy of right eye with macular edema associated with type 2 diabetes mellitus (HCC)  W58.0998 Intravitreal Injection, Pharmacologic Agent - OD - Right Eye    OCT, Retina - OU - Both Eyes    bevacizumab (AVASTIN) SOSY 2.5 mg    2. Moderate nonproliferative diabetic retinopathy of left eye with macular edema associated with type 2 diabetes mellitus ([place])  P38.2505       1.  OD vastly improved macular appearance of by OCT and clinical examination on acuity.  At 8-week follow-up post Avastin for CSME component of disease.  Residual cavitary changes on the OCT will remain the prior untreated and MAC-TEL that is now stabilized on CPAP  2.  OS follow-up dilate, Eylea OCT as scheduled  3.  Ophthalmic Meds Ordered this visit:  Meds ordered this  encounter  Medications   bevacizumab (AVASTIN) SOSY 2.5 mg       Return in about 8 weeks (around 08/11/2021) for dilate, OD, AVASTIN OCT, and OS follow-up as scheduled Eylea OCT.  There are no Patient Instructions on file for this visit.   Explained the diagnoses, plan, and follow up with the patient and they expressed understanding.  Patient expressed understanding of the importance of proper follow up care.   Clent Demark Candyce Gambino M.D. Diseases & Surgery of the Retina and Vitreous [facility] Di[facility] [place] 06/16/21     Abbreviations: M myopia (nearsighted); A astigmatism; H hyperopia (farsighted); P presbyopia; Mrx spectacle prescription;  CTL contact lenses; OD right eye; OS left eye; OU both eyes  XT exotropia; ET esotropia; PEK punctate epithelial keratitis; PEE punctate epithelial erosions; DES dry eye syndrome; MGD meibomian gland dysfunction; ATs artificial tears; PFAT's preservative free artificial tears; [place] nuclear sclerotic cataract; PSC posterior subcapsular cataract; ERM epi-retinal membrane; PVD posterior vitreous detachment; RD retinal detachment; DM diabetes mellitus; DR diabetic retinopathy; NPDR non-proliferative diabetic retinopathy; PDR proliferative diabetic retinopathy; CSME clinically significant macular edema; DME diabetic macular edema; dbh dot blot hemorrhages; CWS cotton wool spot; POAG primary open angle glaucoma; C/D cup-to-disc ratio; HVF humphrey visual field; GVF goldmann visual field; OCT optical coherence tomography; IOP intraocular pressure; BRVO Branch retinal vein occlusion; CRVO central retinal vein occlusion; CRAO central retinal artery occlusion; BRAO branch retinal artery occlusion; RT retinal tear; SB scleral buckle; PPV pars plana vitrectomy; VH Vitreous hemorrhage; PRP panretinal laser photocoagulation; IVK intravitreal kenalog; VMT vitreomacular traction; MH Macular hole;  NVD neovascularization of the disc; NVE neovascularization elsewhere; AREDS age  related eye disease study; ARMD age related macular degeneration; POAG  primary open angle glaucoma; EBMD epithelial/anterior basement membrane dystrophy; ACIOL anterior chamber intraocular lens; IOL intraocular lens; PCIOL posterior chamber intraocular lens; Phaco/IOL phacoemulsification with intraocular lens placement; [place] photorefractive keratectomy; LASIK laser assisted in situ keratomileusis; HTN hypertension; DM diabetes mellitus; COPD chronic obstructive pulmonary disease

## 2021-06-16 NOTE — Assessment & Plan Note (Signed)
OD today at 8-week follow-up, still vastly improved macular thickening, will repeat injection today and maintain a week follow-up ?

## 2021-06-16 NOTE — Assessment & Plan Note (Signed)
OS today much improved temporally 1 week post most recent injection follow-up as scheduled ?

## 2021-06-22 ENCOUNTER — Telehealth: Payer: Self-pay

## 2021-06-22 NOTE — Telephone Encounter (Signed)
Pt daughter is requesting to refill ?dorzolamide-timolol (COSOPT) 22.3-6.8 MG/ML ophthalmic solution ? ?Pharmacy: ?[facility]) - [place] ? ?LOV 10/22/20 ?ROV 07/21/21 ? ?Pt daughter is requesting a year supply. ?

## 2021-06-24 MED ORDER — DORZOLAMIDE HCL-TIMOLOL MAL 2-0.5 % OP SOLN: OPHTHALMIC | 1 refills | Status: DC

## 2021-06-24 NOTE — Telephone Encounter (Signed)
Refill has been sent to the pt's pharmacy  

## 2021-07-02 ENCOUNTER — Ambulatory Visit: Payer: HMO | Admitting: Adult Health

## 2021-07-02 ENCOUNTER — Encounter: Payer: Self-pay | Admitting: Adult Health

## 2021-07-02 VITALS — BP 136/75 | HR 63 | Ht 61.0 in | Wt 147.6 lb

## 2021-07-02 DIAGNOSIS — Z9989 Dependence on other enabling machines and devices: Secondary | ICD-10-CM | POA: Diagnosis not present

## 2021-07-02 DIAGNOSIS — G4733 Obstructive sleep apnea (adult) (pediatric): Secondary | ICD-10-CM

## 2021-07-02 NOTE — Progress Notes (Signed)
PATIENT: Whitney [name] DOB: 05/02/1934  REASON FOR VISIT: follow up HISTORY FROM: patient  HISTORY OF PRESENT ILLNESS: Today 07/02/21:  Ms. Meckley is a 87 year old female with a history of obstructive sleep apnea on CPAP.  Her download is below.  She reports that at the end of February beginning of March she had trouble with her machine.  She states that it is now working better.  Does note that sometimes her mask will leak.    05/15/20: Ms. Hauser is a 87 year old female with a history of OSA on CPAP. She returns today for follow-up. Reports that CPAP is working well. Denies any new issues. Report is below:  Compliance Report: Compliance Usage 04/15/2020 - 05/14/2020 Usage days 23/30 days (77%) >= 4 hours 22 days (73%) Average usage (days used) 7 hours 4 minutes  AirSense 10 AutoSet For Her Serial number [id number] Mode AutoSet for Her Min Pressure 7 cmH2O Max Pressure 16 cmH2O EPR Fulltime EPR level 3  Therapy Pressure - cmH2O Median: 9.4 95th percentile: 11.8 Maximum: 12.7 Leaks - L/min Median: 6.8 95th percentile: 28.5 Maximum: 40.6 Events per hour AI: 3.9 HI: 0.5 AHI: 4.4 Apnea Index Central: 1.8 Obstructive: 1.9 Unknown: 0.2   05/15/19:Ms. Knarr is an 87 year old female with a history of obstructive sleep apnea on CPAP.  She returns today for follow-up.  Her CPAP download indicates that she use her machine 28 out of 30 days for compliance of 93%.  She used her machine greater than 4 hours 27 days for compliance of 90%.  On average she uses her machine 8 hours and 43 minutes.  Her residual AHI is 5.4 on 7 to 16 cm of water with a EPR of 3.  Her leak in the 95th percentile is 32.4 L/min.  She states that she does not change out her supplies as she should because she does not know how.  She returns today for an evaluation.   HISTORY 10/05/18:   Ms. Eriksson is an 87 year old female with a history of obstructive sleep apnea on CPAP.  Her download indicates that she  used her machine 23 out of 30 days for compliance of 77%.  She use her machine greater than 4 hours 22 out of 30 days for compliance of 73%.  On average she uses her machine 7 hours and 54 minutes.  Her residual AHI is 7.1 on 7 to 14 cm of water with EPR of 3.  She states that she has never changed out her supplies.  She does not feel the mask leaking at night.  Although she does report that sometimes she gets a red face on her CPAP machine.  She returns today for follow-up.  REVIEW OF SYSTEMS: Out of a complete 14 system review of symptoms, the patient complains only of the following symptoms, and all other reviewed systems are negative.   ALLERGIES: No Known Allergies  HOME MEDICATIONS: Outpatient Medications Prior to Visit  Medication Sig Dispense Refill   Ascorbic Acid (VITAMIN C) 100 MG tablet Take 100 mg by mouth daily.     aspirin 81 MG tablet Take 1 tablet (81 mg total) by mouth daily. 90 tablet 1   blood glucose meter kit and supplies KIT Use to test blood sugar up to 3 times daily. DX E11.9 1 each 0   Calcium Carb-Cholecalciferol (CALCIUM-VITAMIN D) 500-200 MG-UNIT tablet Take 1 tablet by mouth 2 (two) times daily with a meal. 180 tablet 1   diclofenac Sodium (VOLTAREN) 1 %  GEL Apply 4 g topically 4 (four) times daily. 100 g 6   dorzolamide-timolol (COSOPT) 22.3-6.8 MG/ML ophthalmic solution Instill 1 drop in each eye twice a day 10 mL 1   DUREZOL 0.05 % EMUL      empagliflozin (JARDIANCE) 25 MG TABS tablet Take 1 tablet (25 mg total) by mouth daily before breakfast. 90 tablet 3   glimepiride (AMARYL) 4 MG tablet TAKE 1 TABLET EVERY DAY BEFORE BREAKFAST 90 tablet 1   Lancet Devices (ACCU-CHEK SOFTCLIX) lancets Use to test blood sugar up to 3 times a day. DX E11.9 1 each 0   lisinopril-hydrochlorothiazide (ZESTORETIC) 20-12.5 MG tablet Take 1 tablet by mouth daily. 90 tablet 1   metFORMIN (GLUCOPHAGE) 1000 MG tablet Take 1 tablet (1,000 mg total) by mouth 2 (two) times daily with a  meal. 180 tablet 1   Multiple Vitamin (MULTI VITAMIN DAILY PO) Take one by mouth daily     ONETOUCH VERIO test strip Use to test blood sugar 3 times a day 250 each 0   simvastatin (ZOCOR) 20 MG tablet Take 1 tablet (20 mg total) by mouth at bedtime. 90 tablet 1   zinc gluconate 50 MG tablet Take 50 mg by mouth daily.     No facility-administered medications prior to visit.    PAST MEDICAL HISTORY: Past Medical History:  Diagnosis Date   Anxiety    Baker's cyst    Chest pain    Diabetes mellitus type 2, uncontrolled    Diabetes mellitus without complication (HCC)    Disc disease, degenerative, cervical    Diverticulosis of colon    DJD (degenerative joint disease) of knee    Facial pain, atypical    GERD (gastroesophageal reflux disease)    Glaucoma    Hypercholesteremia    Hypertension    Lumbar spondylosis    Obesity    Onychomycosis    Osteopenia     PAST SURGICAL HISTORY: Past Surgical History:  Procedure Laterality Date   right eye surgery  03-Oct-2010   retina in right eye   VAGINAL HYSTERECTOMY  07-02-2000   Dr. Kem Kays repair    FAMILY HISTORY: Family History  Problem Relation Age of Onset   Diabetes Mother    Colon cancer Neg Hx    Stomach cancer Neg Hx     SOCIAL HISTORY: Social History   Socioeconomic History   Marital status: Widowed    Spouse name: deceased 10/02/08   Number of children: Not on file   Years of education: Not on file   Highest education level: Not on file  Occupational History   Not on file  Tobacco Use   Smoking status: Never   Smokeless tobacco: Never  Vaping Use   Vaping Use: Never used  Substance and Sexual Activity   Alcohol use: No   Drug use: No   Sexual activity: Never  Other Topics Concern   Not on file  Social History Narrative   Lives alone   Spouse passed away 07-02-2008   Son;    3 girls   Clinical cytogeneticist;    Social Determinants of Health   Financial Resource Strain: Not on file  Food Insecurity: No Food Insecurity    Worried About Charity fundraiser in the Last Year: Never true   Ran Out of Food in the Last Year: Never true  Transportation Needs: No Transportation Needs   Lack of Transportation (Medical): No   Lack of Transportation (Non-Medical): No  Physical Activity: Not on  file  Stress: Not on file  Social Connections: Not on file  Intimate Partner Violence: Not on file      PHYSICAL EXAM  Vitals:   07/02/21 0847  BP: 136/75  Pulse: 63  Weight: 147 lb 9.6 oz (67 kg)  Height: '5\' 1"'  (1.549 m)   Body mass index is 27.89 kg/m.  Generalized: Well developed, in no acute distress  Chest: Lungs clear to auscultation bilaterally  Neurological examination  Mentation: Alert oriented to time, place, history taking. Follows all commands speech and language fluent Cranial nerve II-XII: Extraocular movements were full, visual field were full on confrontational test Head turning and shoulder shrug  were normal and symmetric. Gait and station: Gait is normal.   DIAGNOSTIC DATA (LABS, IMAGING, TESTING) - I reviewed patient records, labs, notes, testing and imaging myself where available.  Lab Results  Component Value Date   WBC 5.4 03/17/2020   HGB 12.9 03/17/2020   HCT 39.7 03/17/2020   MCV 79.1 03/17/2020   PLT 222.0 03/17/2020      Component Value Date/Time   NA 138 03/17/2020 0950   K 3.8 03/17/2020 0950   CL 100 03/17/2020 0950   CO2 29 03/17/2020 0950   GLUCOSE 272 (H) 03/17/2020 0950   BUN 11 03/17/2020 0950   CREATININE 0.81 03/17/2020 0950   CALCIUM 10.6 (H) 03/17/2020 0950   PROT 8.0 03/17/2020 0950   ALBUMIN 4.1 03/17/2020 0950   AST 21 03/17/2020 0950   ALT 18 03/17/2020 0950   ALKPHOS 45 03/17/2020 0950   BILITOT 1.0 03/17/2020 0950   GFRNONAA 87.17 02/16/2010 1003   GFRAA 156 02/19/2008 1103   Lab Results  Component Value Date   CHOL 145 03/17/2020   HDL 40.50 03/17/2020   LDLCALC 80 03/17/2020   TRIG 124.0 03/17/2020   CHOLHDL 4 03/17/2020   Lab Results   Component Value Date   HGBA1C 7.7 (A) 10/22/2020   No results found for: VITAMINB12 Lab Results  Component Value Date   TSH 1.13 09/14/2013      ASSESSMENT AND PLAN 87 y.o. year old female  has a past medical history of Anxiety, Baker's cyst, Chest pain, Diabetes mellitus type 2, uncontrolled, Diabetes mellitus without complication ([place]), Disc disease, degenerative, cervical, Diverticulosis of colon, DJD (degenerative joint disease) of knee, Facial pain, atypical, GERD (gastroesophageal reflux disease), Glaucoma, Hypercholesteremia, Hypertension, Lumbar spondylosis, Obesity, Onychomycosis, and Osteopenia. here with:  Obstructive sleep apnea on CPAP  CPAP compliance is excellent Residual AHI slightly elevated may be due to artifact from leak Encouraged the patient to continue using CPAP nightly and greater than 4 hours each night Follow-up in 1 year or sooner if needed   Ward Givens, MSN, NP-C 07/02/2021, 8:58 AM [facility] [address] [place], [place] [address] [phone]

## 2021-07-02 NOTE — Patient Instructions (Signed)
Continue using CPAP nightly and greater than 4 hours each night °If your symptoms worsen or you develop new symptoms please let us know.  ° °

## 2021-07-10 DIAGNOSIS — I10 Essential (primary) hypertension: Secondary | ICD-10-CM | POA: Diagnosis not present

## 2021-07-10 DIAGNOSIS — E785 Hyperlipidemia, unspecified: Secondary | ICD-10-CM

## 2021-07-10 DIAGNOSIS — E1169 Type 2 diabetes mellitus with other specified complication: Secondary | ICD-10-CM | POA: Diagnosis not present

## 2021-07-20 DIAGNOSIS — G4733 Obstructive sleep apnea (adult) (pediatric): Secondary | ICD-10-CM | POA: Diagnosis not present

## 2021-07-21 ENCOUNTER — Ambulatory Visit (INDEPENDENT_AMBULATORY_CARE_PROVIDER_SITE_OTHER): Payer: HMO | Admitting: Internal Medicine

## 2021-07-21 ENCOUNTER — Encounter: Payer: Self-pay | Admitting: Internal Medicine

## 2021-07-21 VITALS — BP 122/66 | HR 61 | Resp 18 | Ht 61.0 in | Wt 147.8 lb

## 2021-07-21 DIAGNOSIS — Z0000 Encounter for general adult medical examination without abnormal findings: Secondary | ICD-10-CM

## 2021-07-21 DIAGNOSIS — E785 Hyperlipidemia, unspecified: Secondary | ICD-10-CM

## 2021-07-21 DIAGNOSIS — I10 Essential (primary) hypertension: Secondary | ICD-10-CM | POA: Diagnosis not present

## 2021-07-21 DIAGNOSIS — R413 Other amnesia: Secondary | ICD-10-CM

## 2021-07-21 DIAGNOSIS — E1169 Type 2 diabetes mellitus with other specified complication: Secondary | ICD-10-CM

## 2021-07-21 LAB — COMPREHENSIVE METABOLIC PANEL
ALT: 15 U/L (ref 0–35)
AST: 20 U/L (ref 0–37)
Albumin: 4.2 g/dL (ref 3.5–5.2)
Alkaline Phosphatase: 43 U/L (ref 39–117)
BUN: 17 mg/dL (ref 6–23)
CO2: 30 mEq/L (ref 19–32)
Calcium: 9.9 mg/dL (ref 8.4–10.5)
Chloride: 103 mEq/L (ref 96–112)
Creatinine, Ser: 0.78 mg/dL (ref 0.40–1.20)
GFR: 69.73 mL/min (ref >60.00)
Glucose, Bld: 144 mg/dL — ABNORMAL HIGH (ref 70–99)
Potassium: 3.6 mEq/L (ref 3.5–5.1)
Sodium: 140 mEq/L (ref 135–145)
Total Bilirubin: 1 mg/dL (ref 0.2–1.2)
Total Protein: 7.7 g/dL (ref 6.0–8.3)

## 2021-07-21 LAB — LIPID PANEL
Cholesterol: 128 mg/dL (ref 0–200)
HDL: 41.7 mg/dL (ref >39.00)
LDL Cholesterol: 67 mg/dL (ref 0–99)
NonHDL: 86.39
Total CHOL/HDL Ratio: 3
Triglycerides: 97 mg/dL (ref 0.0–149.0)
VLDL: 19.4 mg/dL (ref 0.0–40.0)

## 2021-07-21 LAB — CBC
HCT: 40.3 % (ref 36.0–46.0)
Hemoglobin: 13.1 g/dL (ref 12.0–15.0)
MCHC: 32.5 g/dL (ref 30.0–36.0)
MCV: 80.7 fl (ref 78.0–100.0)
Platelets: 195 10*3/uL (ref 150.0–400.0)
RBC: 5 Mil/uL (ref 3.87–5.11)
RDW: 14.9 % (ref 11.5–15.5)
WBC: 5.6 10*3/uL (ref 4.0–10.5)

## 2021-07-21 LAB — HEMOGLOBIN A1C: Hgb A1c MFr Bld: 7.9 % — ABNORMAL HIGH (ref 4.6–6.5)

## 2021-07-21 NOTE — Patient Instructions (Signed)
Ask the pharmacist about the shingles vaccine as it may be cheaper.  ? ? ?

## 2021-07-21 NOTE — Progress Notes (Signed)
? ?  Subjective:  ? ?Patient ID: Whitney [name], female    DOB: [birth year], 87 y.o.   MRN: [id number] ? ?HPI ?The patient is a 87 YO female coming in for physical. Also some concerns from patient and daughter.  ? ?PMH, [facility], social history reviewed and updated ? ?Review of Systems  ?Constitutional: Negative.   ?HENT: Negative.    ?Eyes: Negative.   ?Respiratory:  Negative for cough, chest tightness and shortness of breath.   ?Cardiovascular:  Negative for chest pain, palpitations and leg swelling.  ?Gastrointestinal:  Negative for abdominal distention, abdominal pain, constipation, diarrhea, nausea and vomiting.  ?Musculoskeletal:  Positive for arthralgias.  ?Skin: Negative.   ?Neurological: Negative.   ?Psychiatric/Behavioral: Negative.    ? ?Objective:  ?Physical Exam ?Constitutional:   ?   Appearance: She is well-developed.  ?HENT:  ?   Head: Normocephalic and atraumatic.  ?Cardiovascular:  ?   Rate and Rhythm: Normal rate and regular rhythm.  ?Pulmonary:  ?   Effort: Pulmonary effort is normal. No respiratory distress.  ?   Breath sounds: Normal breath sounds. No wheezing or rales.  ?Abdominal:  ?   General: Bowel sounds are normal. There is no distension.  ?   Palpations: Abdomen is soft.  ?   Tenderness: There is no abdominal tenderness. There is no rebound.  ?Musculoskeletal:  ?   Cervical back: Normal range of motion.  ?Skin: ?   General: Skin is warm and dry.  ?Neurological:  ?   Mental Status: She is alert and oriented to person, place, and time.  ?   Coordination: Coordination normal.  ? ? ?Vitals:  ? 07/21/21 0912  ?BP: 122/66  ?Pulse: 61  ?Resp: 18  ?SpO2: 98%  ?Weight: 147 lb 12.8 oz (67 kg)  ?Height: '5\' 1"'$  (1.549 m)  ? ? ?This visit occurred during the SARS-CoV-2 public health emergency.  Safety protocols were in place, including screening questions prior to the visit, additional usage of staff PPE, and extensive cleaning of exam room while observing appropriate contact time as indicated for  disinfecting solutions.  ? ?Assessment & Plan:  ? ?

## 2021-07-24 ENCOUNTER — Ambulatory Visit (INDEPENDENT_AMBULATORY_CARE_PROVIDER_SITE_OTHER): Payer: HMO | Admitting: *Deleted

## 2021-07-24 DIAGNOSIS — E1169 Type 2 diabetes mellitus with other specified complication: Secondary | ICD-10-CM

## 2021-07-24 NOTE — Assessment & Plan Note (Signed)
Flu shot counseled. Covid-19 counseled. Pneumonia complete. Shingrix counseled to get at pharmacy. Tetanus counseled to get at pharmacy. Colonoscopy aged out. Mammogram aged out, pap smear aged out and dexa complete. Counseled about sun safety and mole surveillance. Counseled about the dangers of distracted driving. Given 10 year screening recommendations.  ? ?

## 2021-07-24 NOTE — Assessment & Plan Note (Signed)
Foot exam done, checking Hga1c and lipid panel. Adjust jardiance 25 mg daily and glimepiride 4 mg daily and metformin 1000 mg BID. She is on ACE-I and statin.  ?

## 2021-07-24 NOTE — Assessment & Plan Note (Signed)
Checking lipid panel and adjust simvastatin 20 mg daily for LDL goal <100, <70 ideal.  ?

## 2021-07-24 NOTE — Chronic Care Management (AMB) (Signed)
Chronic Care Management   CCM RN Visit Note  07/24/2021 Name: Whitney [name] MRN: [id number] DOB: 1935-02-17  Subjective: Whitney [name] is a 87 y.o. year old female who is a primary care patient of [name], MD. The care management team was consulted for assistance with disease management and care coordination needs.    Engaged with patient by telephone for follow up visit in response to provider referral for case management and/or care coordination services.   Consent to Services:  The patient was given information about Chronic Care Management services, agreed to services, and gave verbal consent prior to initiation of services.  Please see initial visit note for detailed documentation.  Patient agreed to services and verbal consent obtained.   Assessment: Review of patient past medical history, allergies, medications, health status, including review of consultants reports, laboratory and other test data, was performed as part of comprehensive evaluation and provision of chronic care management services.   SDOH (Social Determinants of Health) assessments and interventions performed:  SDOH Interventions    Flowsheet Row Most Recent Value  SDOH Interventions   Food Insecurity Interventions Intervention Not Indicated  [continues to deny food insecurity]  Housing Interventions Intervention Not Indicated  [continues to reside along- family checks in frequently]  Transportation Interventions Intervention Not Indicated  [Family continues to provide transportation]     CCM Care Plan  No Known Allergies  Outpatient Encounter Medications as of 07/24/2021  Medication Sig Note   Ascorbic Acid (VITAMIN C) 100 MG tablet Take 100 mg by mouth daily.    aspirin 81 MG tablet Take 1 tablet (81 mg total) by mouth daily.    blood glucose meter kit and supplies KIT Use to test blood sugar up to 3 times daily. DX E11.9    Calcium Carb-Cholecalciferol (CALCIUM-VITAMIN D) 500-200  MG-UNIT tablet Take 1 tablet by mouth 2 (two) times daily with a meal.    diclofenac Sodium (VOLTAREN) 1 % GEL Apply 4 g topically 4 (four) times daily.    dorzolamide-timolol (COSOPT) 22.3-6.8 MG/ML ophthalmic solution Instill 1 drop in each eye twice a day    DUREZOL 0.05 % EMUL  03/17/2015: Received from: External Pharmacy   empagliflozin (JARDIANCE) 25 MG TABS tablet Take 1 tablet (25 mg total) by mouth daily before breakfast.    glimepiride (AMARYL) 4 MG tablet TAKE 1 TABLET EVERY DAY BEFORE BREAKFAST    Lancet Devices (ACCU-CHEK SOFTCLIX) lancets Use to test blood sugar up to 3 times a day. DX E11.9    lisinopril-hydrochlorothiazide (ZESTORETIC) 20-12.5 MG tablet Take 1 tablet by mouth daily.    metFORMIN (GLUCOPHAGE) 1000 MG tablet Take 1 tablet (1,000 mg total) by mouth 2 (two) times daily with a meal.    Multiple Vitamin (MULTI VITAMIN DAILY PO) Take one by mouth daily    ONETOUCH VERIO test strip Use to test blood sugar 3 times a day    simvastatin (ZOCOR) 20 MG tablet Take 1 tablet (20 mg total) by mouth at bedtime.    zinc gluconate 50 MG tablet Take 50 mg by mouth daily.    No facility-administered encounter medications on file as of 07/24/2021.   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 associated with type 2 diabetes mellitus ([place]) 08/13/2019   Moderate nonproliferative diabetic retinopathy of right eye with macular edema (HCC) 08/13/2019   Type 2 macular telangiectasis, left 08/13/2019  Retinal telangiectasia of right eye 08/13/2019   OSA on CPAP 07/20/2017   Venous insufficiency 04/26/2017   Sensorineural hearing loss (SNHL), bilateral 12/08/2016   Routine general medical examination at a health care facility 05/25/2016   Type 2 diabetes mellitus with hyperlipidemia ([place]) 02/23/2012   GLAUCOMA 08/24/2009   Left shoulder pain 02/19/2008   Hyperlipidemia  associated with type 2 diabetes mellitus ([place]) 12/22/2006   Essential hypertension 12/22/2006   GERD 12/22/2006   DISC DISEASE, CERVICAL 12/22/2006   Osteopenia 12/22/2006   Conditions to be addressed/monitored:  HTN, HLD, and DMII  Care Plan : RN Care Manager Plan of Care  Updates made by Knox Royalty, RN since 07/24/2021 12:00 AM     Problem: Chronic Disease Management Needs   Priority: High     Long-Range Goal: Ongoing adherence to established plan of care for long term chronic disease management   Start Date: 02/25/2021  Expected End Date: 02/25/2022  Priority: High  Note:   Current Barriers:  Chronic Disease Management support and education needs related to HLD and DMII Hard of hearing- lost hearing aid for (L) ear- verbalizes plans to replace in coming months; 06/15/21 and 07/24/21-- reports has not yet replaced; "still looking into it" Retinal issues- sees specialist for eye injections; occasional issues with reading; able to drive short distances; family assists with driving long distances    RNCM Clinical Goal(s):  Patient will demonstrate Ongoing health management independence DMII; HLD  through collaboration with RN Care manager, provider, and care team.   Interventions: 1:1 collaboration with primary care provider regarding development and update of comprehensive plan of care as evidenced by provider attestation and co-signature Inter-disciplinary care team collaboration (see longitudinal plan of care) Evaluation of current treatment plan related to  self management and patient's adherence to plan as established by provider 03/19/21- Initial assessment completed Review of patient status, including review of consultants reports, relevant laboratory and other test results, and medications completed SDOH updated: no new/ unmet concerns identified Depression screening updated: no concerns identified Pain assessment updated: continues to deny pain; occasional "aches and  pains" well managed at home using OTC medications Falls assessment updated: she continues to deny new/ recent falls x 12 months- currently not using assistive devices- has cane for use as/ if indicated/ needed;  positive reinforcement provided with encouragement to continue efforts at fall prevention; previously provided education around fall risks/ prevention reinforced Medications discussed:  reports continues to independently self-manage and denies current concerns/ issues/ questions around medications; endorses adherence to taking all medications as prescribed Reviewed upcoming scheduled provider appointments: 07/28/21 and 08/11/21- retinal eye specialist; 09/11/21- [facility] outreach; 01/20/22- PCP; patient confirms is aware of all and has plans to attend as scheduled Reviewed recent provider office visit to neurology provider 07/02/21 for CPAP management: she denies post-office visit questions, confirms she continues using CPAP as directed Reviewed recent PCP office visit 07/21/21; reviewed lab value results; patient denies questions post-office visit Discussed plans with patient for ongoing care management follow up and provided patient with direct contact information for care management team     Hyperlipidemia/ HTN Interventions:  GOAL STATUS: 07/24/21: Goal on track; Long Term goal Reviewed importance of limiting foods high in cholesterol Reviewed exercise goals and target of 150 minutes per week Confirmed uses CPAP unit at night as prescribed; no issues reported Confirmed patient continues to get regular exercise: follows an exercise program on TV through [facility]: continues to reports tries to exercise 5 times per  week; positive reinforcement provided with encouragement to continue efforts; she again tells me that she is hoping to attend in-person exercise program for [facility] at [facility] "soon" Confirmed patient continues monitoring/ recording blood pressures at home: reports blood  pressures at home ranging between 126-138/62-74  Diabetes Interventions: GOAL STATUS: 07/24/21: Goal on track; Long Term Goal Assessed patient's understanding of A1c goal: <7% Provided education to patient about basic DM disease process Counseled on importance of regular laboratory monitoring as prescribed Confirmed patient continues to monitor/ record blood sugars at home BID-TID: reports consistent fasting blood sugar values at home between 129-166: she does not have post-prandial values to review today  Reinforced previously provided education around general goals for blood sugar values at home: between 120-130 fasting; between 160-170 post-prandial Assessed patient's understanding of meaning/ significance of A1-C values: ongoing good baseline understanding of same, however, patient will benefit from ongoing reinforcement/ education/ support- Reviewed individual historical A1-C trends and provided education around correlation of A1-C value to blood sugar levels at home over 3 months: her most recent A1-C value is right in-line with previous values/ results Confirmed patient continues to try to follow heart healthy, low sodium, carbohydrate modified, low sugar diet- positive reinforcement provided with encouragement to continue efforts; reinforced previously provided education around concept of portion control and dietary choices- positive reinforcement provided with encouragement to continue efforts- will provide additional printed educational material to mail to patient  Lab Results  Component Value Date   HGBA1C 7.9 (H) 07/21/2021  Patient Goals/Self-Care Activities: As evidenced by review of EHR, collaboration with care team, and patient reporting during CCM RN CM outreach,  Patient [place] will: Take medications as prescribed Attend all scheduled provider appointments Call pharmacy for medication refills Call provider office for new concerns or questions Continue to check fasting (first  thing in the morning, before eating) blood sugars at home: your goal for fasting blood sugars are between 130-140 Continue to occasionally check after-eating blood sugars 2 hours after a meal: your goal for after eating blood sugars are between 160-180 Continue to follow heart healthy, low salt, low cholesterol, carbohydrate-modified, low sugar diet: keep up the good work trying to make sure you control your portions Continue staying as active as possible Keep up the great work making effort to prevent falls- continue to use your cane as needed  Follow Up Plan:   Telephone follow up appointment with care management team member scheduled for:  Wednesday October 21, 2021 at 11:30 am The patient has been provided with contact information for the care management team and has been advised to call with any health related questions or concerns    Plan: The patient has been provided with contact information for the care management team and has been advised to call with any health related questions or concerns  [name], RN, BSN, [place] [phone]: direct office

## 2021-07-24 NOTE — Patient Instructions (Addendum)
Visit Information  [name], thank you for taking time to talk with me today. Please don't hesitate to contact me if I can be of assistance to you before our next scheduled telephone appointment  Below are the goals we discussed today:  Patient Self-Care Activities: Patient Whitney [name] will: Take medications as prescribed Attend all scheduled provider appointments Call pharmacy for medication refills Call provider office for new concerns or questions Continue to check fasting (first thing in the morning, before eating) blood sugars at home: your goal for fasting blood sugars are between 130-140 Continue to occasionally check after-eating blood sugars 2 hours after a meal: your goal for after eating blood sugars are between 160-180 Continue to follow heart healthy, low salt, low cholesterol, carbohydrate-modified, low sugar diet: keep up the good work trying to make sure you control your portions Continue staying as active as possible Keep up the great work making effort to prevent falls- continue to use your cane as needed  Our next scheduled telephone follow up visit/ appointment is scheduled on: Wednesday, October 21, 2021 at 11:30 am- This is a PHONE [place] appointment  If you need to cancel or re-schedule our visit, please call [phone] and our care guide team will be happy to assist you.   I look forward to hearing about your progress.   [name], RN, BSN, [place] [phone]: direct office  If you are experiencing a Mental Health or [place] or need someone to talk to, please  call the Suicide and Crisis Lifeline: 988 call the [place] National Suicide Prevention Lifeline: (872)413-5595 or TTY: (956) 778-6646 TTY 954 310 1451) to talk to a trained counselor call 1-800-273-TALK (toll free, 24 hour hotline) go to [facility] [address] [place] [phone]) call  911   The patient verbalized understanding of instructions, educational materials, and care plan provided today and agreed to receive a mailed copy of patient instructions, educational materials, and care plan   Diabetes Mellitus and Nutrition, Adult When you have diabetes, or diabetes mellitus, it is very important to have healthy eating habits because your blood sugar (glucose) levels are greatly affected by what you eat and drink. Eating healthy foods in the right amounts, at about the same times every day, can help you: Manage your blood glucose. Lower your risk of heart disease. Improve your blood pressure. Reach or maintain a healthy weight. What can affect my meal plan? Every person with diabetes is different, and each person has different needs for a meal plan. Your health care provider may recommend that you work with a dietitian to make a meal plan that is best for you. Your meal plan may vary depending on factors such as: The calories you need. The medicines you take. Your weight. Your blood glucose, blood pressure, and cholesterol levels. Your activity level. Other health conditions you have, such as heart or kidney disease. How do carbohydrates affect me? Carbohydrates, also called carbs, affect your blood glucose level more than any other type of food. Eating carbs raises the amount of glucose in your blood. It is important to know how many carbs you can safely have in each meal. This is different for every person. Your dietitian can help you calculate how many carbs you should have at each meal and for each snack. How does alcohol affect me? Alcohol can cause a decrease in blood glucose (hypoglycemia), especially if you use insulin or take certain diabetes  medicines by mouth. Hypoglycemia can be a life-threatening condition. Symptoms of hypoglycemia, such as sleepiness, dizziness, and confusion, are similar to symptoms of having too much alcohol. Do not drink alcohol if: Your  health care provider tells you not to drink. You are pregnant, may be pregnant, or are planning to become pregnant. If you drink alcohol: Limit how much you have to: 0-1 drink a day for women. 0-2 drinks a day for men. Know how much alcohol is in your drink. In the [place] one drink equals one 12 oz bottle of beer (355 mL), one 5 oz glass of wine (148 mL), or one 1 oz glass of hard liquor (44 mL). Keep yourself hydrated with water, diet soda, or unsweetened iced tea. Keep in mind that regular soda, juice, and other mixers may contain a lot of sugar and must be counted as carbs. What are tips for following this plan?  Reading food labels Start by checking the serving size on the Nutrition Facts label of packaged foods and drinks. The number of calories and the amount of carbs, fats, and other nutrients listed on the label are based on one serving of the item. Many items contain more than one serving per package. Check the total grams (g) of carbs in one serving. Check the number of grams of saturated fats and trans fats in one serving. Choose foods that have a low amount or none of these fats. Check the number of milligrams (mg) of salt (sodium) in one serving. Most people should limit total sodium intake to less than 2,300 mg per day. Always check the nutrition information of foods labeled as "low-fat" or "nonfat." These foods may be higher in added sugar or refined carbs and should be avoided. Talk to your dietitian to identify your daily goals for nutrients listed on the label. Shopping Avoid buying canned, pre-made, or processed foods. These foods tend to be high in fat, sodium, and added sugar. Shop around the outside edge of the grocery store. This is where you will most often find fresh fruits and vegetables, bulk grains, fresh meats, and fresh dairy products. Cooking Use low-heat cooking methods, such as baking, instead of high-heat cooking methods, such as deep frying. Cook using healthy  oils, such as olive, canola, or sunflower oil. Avoid cooking with butter, cream, or high-fat meats. Meal planning Eat meals and snacks regularly, preferably at the same times every day. Avoid going long periods of time without eating. Eat foods that are high in fiber, such as fresh fruits, vegetables, beans, and whole grains. Eat 4-6 oz (112-168 g) of lean protein each day, such as lean meat, chicken, fish, eggs, or tofu. One ounce (oz) (28 g) of lean protein is equal to: 1 oz (28 g) of meat, chicken, or fish. 1 egg.  cup (62 g) of tofu. Eat some foods each day that contain healthy fats, such as avocado, nuts, seeds, and fish. What foods should I eat? Fruits Berries. Apples. Oranges. Peaches. Apricots. Plums. Grapes. Mangoes. Papayas. Pomegranates. Kiwi. Cherries. Vegetables Leafy greens, including lettuce, spinach, kale, chard, collard greens, mustard greens, and cabbage. Beets. Cauliflower. Broccoli. Carrots. Green beans. Tomatoes. Peppers. Onions. Cucumbers. Brussels sprouts. Grains Whole grains, such as whole-wheat or whole-grain bread, crackers, tortillas, cereal, and pasta. Unsweetened oatmeal. Quinoa. Brown or wild rice. Meats and other proteins Seafood. Poultry without skin. Lean cuts of poultry and beef. Tofu. Nuts. Seeds. Dairy Low-fat or fat-free dairy products such as milk, yogurt, and cheese. The items listed above may  not be a complete list of foods and beverages you can eat and drink. Contact a dietitian for more information. What foods should I avoid? Fruits Fruits canned with syrup. Vegetables Canned vegetables. Frozen vegetables with butter or cream sauce. Grains Refined white flour and flour products such as bread, pasta, snack foods, and cereals. Avoid all processed foods. Meats and other proteins Fatty cuts of meat. Poultry with skin. Breaded or fried meats. Processed meat. Avoid saturated fats. Dairy Full-fat yogurt, cheese, or milk. Beverages Sweetened  drinks, such as soda or iced tea. The items listed above may not be a complete list of foods and beverages you should avoid. Contact a dietitian for more information. Questions to ask a health care provider Do I need to meet with a certified diabetes care and education specialist? Do I need to meet with a dietitian? What number can I call if I have questions? When are the best times to check my blood glucose? Where to find more information: American Diabetes Association: diabetes.org Academy of Nutrition and Dietetics: eatright.[employer] of Diabetes and Digestive and Kidney Diseases: [employer]is Association of Diabetes Care & Education Specialists: diabeteseducator.org Summary It is important to have healthy eating habits because your blood sugar (glucose) levels are greatly affected by what you eat and drink. It is important to use alcohol carefully. A healthy meal plan will help you manage your blood glucose and lower your risk of heart disease. Your health care provider may recommend that you work with a dietitian to make a meal plan that is best for you. This information is not intended to replace advice given to you by your health care provider. Make sure you discuss any questions you have with your health care provider. Document Revised: 10/31/2019 Document Reviewed: 10/31/2019 Elsevier Patient Education  [place].

## 2021-07-24 NOTE — Assessment & Plan Note (Signed)
BP at goal on regimen lisinopril/hctz 20/12.5 and checking CMP. Adjust as needed.  ?

## 2021-07-24 NOTE — Assessment & Plan Note (Signed)
We discussed prior imaging of the brain as well as prior labs which were done. These did not find a clear cause for memory changes. They are not able to clarify well what memory changes and if they are changing. Will refer to neuropsych for testing to assess degree of changes.  ?

## 2021-07-28 ENCOUNTER — Emergency Department (HOSPITAL_BASED_OUTPATIENT_CLINIC_OR_DEPARTMENT_OTHER): Payer: HMO

## 2021-07-28 ENCOUNTER — Emergency Department (HOSPITAL_COMMUNITY)
Admission: EM | Admit: 2021-07-28 | Discharge: 2021-07-28 | Disposition: A | Discharge status: Home / Self Care | Payer: HMO

## 2021-07-28 ENCOUNTER — Telehealth: Payer: Self-pay

## 2021-07-28 ENCOUNTER — Encounter (INDEPENDENT_AMBULATORY_CARE_PROVIDER_SITE_OTHER): Payer: Self-pay | Admitting: Ophthalmology

## 2021-07-28 ENCOUNTER — Other Ambulatory Visit: Payer: Self-pay

## 2021-07-28 ENCOUNTER — Encounter (HOSPITAL_BASED_OUTPATIENT_CLINIC_OR_DEPARTMENT_OTHER): Payer: Self-pay | Admitting: Obstetrics and Gynecology

## 2021-07-28 ENCOUNTER — Emergency Department (HOSPITAL_BASED_OUTPATIENT_CLINIC_OR_DEPARTMENT_OTHER)
Admission: EM | Admit: 2021-07-28 | Discharge: 2021-07-28 | Disposition: A | Discharge status: Home / Self Care | Payer: HMO | Attending: Emergency Medicine | Admitting: Emergency Medicine

## 2021-07-28 ENCOUNTER — Ambulatory Visit (INDEPENDENT_AMBULATORY_CARE_PROVIDER_SITE_OTHER): Payer: HMO | Admitting: Ophthalmology

## 2021-07-28 DIAGNOSIS — R10812 Left upper quadrant abdominal tenderness: Secondary | ICD-10-CM | POA: Insufficient documentation

## 2021-07-28 DIAGNOSIS — S2232XA Fracture of one rib, left side, initial encounter for closed fracture: Secondary | ICD-10-CM | POA: Diagnosis not present

## 2021-07-28 DIAGNOSIS — E113311 Type 2 diabetes mellitus with moderate nonproliferative diabetic retinopathy with macular edema, right eye: Secondary | ICD-10-CM | POA: Diagnosis not present

## 2021-07-28 DIAGNOSIS — E119 Type 2 diabetes mellitus without complications: Secondary | ICD-10-CM | POA: Diagnosis not present

## 2021-07-28 DIAGNOSIS — H35072 Retinal telangiectasis, left eye: Secondary | ICD-10-CM | POA: Diagnosis not present

## 2021-07-28 DIAGNOSIS — Z7984 Long term (current) use of oral hypoglycemic drugs: Secondary | ICD-10-CM | POA: Insufficient documentation

## 2021-07-28 DIAGNOSIS — Y92008 Other place in unspecified non-institutional (private) residence as the place of occurrence of the external cause: Secondary | ICD-10-CM | POA: Diagnosis not present

## 2021-07-28 DIAGNOSIS — Z79899 Other long term (current) drug therapy: Secondary | ICD-10-CM | POA: Diagnosis not present

## 2021-07-28 DIAGNOSIS — Z9989 Dependence on other enabling machines and devices: Secondary | ICD-10-CM | POA: Diagnosis not present

## 2021-07-28 DIAGNOSIS — S3993XA Unspecified injury of pelvis, initial encounter: Secondary | ICD-10-CM | POA: Diagnosis not present

## 2021-07-28 DIAGNOSIS — G4733 Obstructive sleep apnea (adult) (pediatric): Secondary | ICD-10-CM

## 2021-07-28 DIAGNOSIS — E113312 Type 2 diabetes mellitus with moderate nonproliferative diabetic retinopathy with macular edema, left eye: Secondary | ICD-10-CM

## 2021-07-28 DIAGNOSIS — I10 Essential (primary) hypertension: Secondary | ICD-10-CM | POA: Insufficient documentation

## 2021-07-28 DIAGNOSIS — H35071 Retinal telangiectasis, right eye: Secondary | ICD-10-CM | POA: Diagnosis not present

## 2021-07-28 DIAGNOSIS — W11XXXA Fall on and from ladder, initial encounter: Secondary | ICD-10-CM | POA: Diagnosis not present

## 2021-07-28 DIAGNOSIS — Z7982 Long term (current) use of aspirin: Secondary | ICD-10-CM | POA: Diagnosis not present

## 2021-07-28 DIAGNOSIS — S3991XA Unspecified injury of abdomen, initial encounter: Secondary | ICD-10-CM | POA: Diagnosis not present

## 2021-07-28 DIAGNOSIS — S299XXA Unspecified injury of thorax, initial encounter: Secondary | ICD-10-CM | POA: Diagnosis present

## 2021-07-28 LAB — COMPREHENSIVE METABOLIC PANEL
ALT: 15 U/L (ref 0–44)
AST: 21 U/L (ref 15–41)
Albumin: 4.3 g/dL (ref 3.5–5.0)
Alkaline Phosphatase: 40 U/L (ref 38–126)
Anion gap: 11 (ref 5–15)
BUN: 14 mg/dL (ref 8–23)
CO2: 30 mmol/L (ref 22–32)
Calcium: 11 mg/dL — ABNORMAL HIGH (ref 8.9–10.3)
Chloride: 100 mmol/L (ref 98–111)
Creatinine, Ser: 0.79 mg/dL (ref 0.44–1.00)
GFR, Estimated: >60 mL/min (ref >60)
Glucose, Bld: 139 mg/dL — ABNORMAL HIGH (ref 70–99)
Potassium: 3.9 mmol/L (ref 3.5–5.1)
Sodium: 141 mmol/L (ref 135–145)
Total Bilirubin: 1.5 mg/dL — ABNORMAL HIGH (ref 0.3–1.2)
Total Protein: 8.1 g/dL (ref 6.5–8.1)

## 2021-07-28 LAB — CBC
HCT: 41.9 % (ref 36.0–46.0)
Hemoglobin: 13.5 g/dL (ref 12.0–15.0)
MCH: 25.7 pg — ABNORMAL LOW (ref 26.0–34.0)
MCHC: 32.2 g/dL (ref 30.0–36.0)
MCV: 79.7 fL — ABNORMAL LOW (ref 80.0–100.0)
Platelets: 208 10*3/uL (ref 150–400)
RBC: 5.26 MIL/uL — ABNORMAL HIGH (ref 3.87–5.11)
RDW: 14.9 % (ref 11.5–15.5)
WBC: 8.6 10*3/uL (ref 4.0–10.5)
nRBC: 0 % (ref 0.0–0.2)

## 2021-07-28 MED ORDER — HYDROCODONE-ACETAMINOPHEN 5-325 MG PO TABS: 1.0000 | ORAL_TABLET | ORAL | 0 refills | Status: AC | PRN

## 2021-07-28 MED ORDER — FENTANYL CITRATE PF 50 MCG/ML IJ SOSY
50.0000 ug | PREFILLED_SYRINGE | Freq: Once | INTRAMUSCULAR | Status: AC
  Administered 2021-07-28: 50 ug via INTRAVENOUS
  Filled 2021-07-28: qty 1

## 2021-07-28 MED ORDER — IOHEXOL 300 MG/ML  SOLN
75.0000 mL | Freq: Once | INTRAMUSCULAR | Status: AC | PRN
  Administered 2021-07-28: 75 mL via INTRAVENOUS

## 2021-07-28 MED ORDER — AFLIBERCEPT 2MG/0.05ML IZ SOLN FOR KALEIDOSCOPE
2.0000 mg | INTRAVITREAL | Status: AC | PRN
  Administered 2021-07-28: 2 mg via INTRAVITREAL

## 2021-07-28 NOTE — ED Triage Notes (Signed)
Patient reports to the ER for a fall. Reports she was on a step ladder and was reaching up and fell sideways onto a clothes hamper in her laundry room. Patient denies LOC. Patient reports pain in her left ribcage/trunk region.  ?

## 2021-07-28 NOTE — Assessment & Plan Note (Signed)
MAC-TEL type II OD, with resulting large cavitary lesion temporally likely baseline forever, but has worsened in the past when off of antivegF due to the superimposed diabetic retinopathy changes ?

## 2021-07-28 NOTE — ED Notes (Signed)
[facility] called to inform us that patient left [place] and is now at [facility]. ?

## 2021-07-28 NOTE — ED Notes (Signed)
Patient transported to CT 

## 2021-07-28 NOTE — Assessment & Plan Note (Signed)
OD vastly improved, large cavitary portion of CME will not manage as this is part of the old chronic MAC-TEL prior to ongoing CPAP therapy for sleep apnea ?

## 2021-07-28 NOTE — Telephone Encounter (Signed)
Pt is requesting a call back pertaining to a fall 45 mins ago. Pt was on step ladder. Pt was on the highest step. Pt reports that the left side hurts. Denies hitting head.  ? ?After speaking with the nurse, I advised pt to go the ED. Pt agreed to go and be reevaluated. ? ?FYI ?

## 2021-07-28 NOTE — Assessment & Plan Note (Signed)
Continues excellent compliance ?

## 2021-07-28 NOTE — Progress Notes (Signed)
07/28/2021     CHIEF COMPLAINT Patient presents for  Chief Complaint  Patient presents with   Diabetic Retinopathy with Macular Edema      HISTORY OF PRESENT ILLNESS: Whitney [name] is a 87 y.o. female who presents to the clinic today for:   HPI   7 weeks for DILATE, OS, EYLEA, OCT. Pt denies floaters and FOL. Pt states vision has been stable, no changes in vision.   Last edited by Silvestre Moment on 07/28/2021  9:46 AM.      Referring physician: [name], MD [place],  [place] [address]  HISTORICAL INFORMATION:   Selected notes from the MEDICAL RECORD NUMBER    Lab Results  Component Value Date   HGBA1C 7.9 (H) 07/21/2021     CURRENT MEDICATIONS: Current Outpatient Medications (Ophthalmic Drugs)  Medication Sig   dorzolamide-timolol (COSOPT) 22.3-6.8 MG/ML ophthalmic solution Instill 1 drop in each eye twice a day   DUREZOL 0.05 % EMUL    No current facility-administered medications for this visit. (Ophthalmic Drugs)   Current Outpatient Medications (Other)  Medication Sig   Ascorbic Acid (VITAMIN C) 100 MG tablet Take 100 mg by mouth daily.   aspirin 81 MG tablet Take 1 tablet (81 mg total) by mouth daily.   blood glucose meter kit and supplies KIT Use to test blood sugar up to 3 times daily. DX E11.9   Calcium Carb-Cholecalciferol (CALCIUM-VITAMIN D) 500-200 MG-UNIT tablet Take 1 tablet by mouth 2 (two) times daily with a meal.   diclofenac Sodium (VOLTAREN) 1 % GEL Apply 4 g topically 4 (four) times daily.   empagliflozin (JARDIANCE) 25 MG TABS tablet Take 1 tablet (25 mg total) by mouth daily before breakfast.   glimepiride (AMARYL) 4 MG tablet TAKE 1 TABLET EVERY DAY BEFORE BREAKFAST   Lancet Devices (ACCU-CHEK SOFTCLIX) lancets Use to test blood sugar up to 3 times a day. DX E11.9   lisinopril-hydrochlorothiazide (ZESTORETIC) 20-12.5 MG tablet Take 1 tablet by mouth daily.   metFORMIN (GLUCOPHAGE) 1000 MG tablet Take 1 tablet (1,000  mg total) by mouth 2 (two) times daily with a meal.   Multiple Vitamin (MULTI VITAMIN DAILY PO) Take one by mouth daily   ONETOUCH VERIO test strip Use to test blood sugar 3 times a day   simvastatin (ZOCOR) 20 MG tablet Take 1 tablet (20 mg total) by mouth at bedtime.   zinc gluconate 50 MG tablet Take 50 mg by mouth daily.   No current facility-administered medications for this visit. (Other)      REVIEW OF SYSTEMS: ROS   Negative for: Constitutional, Gastrointestinal, Neurological, Skin, Genitourinary, Musculoskeletal, HENT, Endocrine, Cardiovascular, Eyes, Respiratory, Psychiatric, Allergic/Imm, Heme/Lymph Last edited by Silvestre Moment on 07/28/2021  9:46 AM.       ALLERGIES No Known Allergies  PAST MEDICAL HISTORY Past Medical History:  Diagnosis Date   Anxiety    Baker's cyst    Chest pain    Diabetes mellitus type 2, uncontrolled    Diabetes mellitus without complication (HCC)    Disc disease, degenerative, cervical    Diverticulosis of colon    DJD (degenerative joint disease) of knee    Facial pain, atypical    GERD (gastroesophageal reflux disease)    Glaucoma    Hypercholesteremia    Hypertension    Lumbar spondylosis    Obesity    Onychomycosis    Osteopenia    Past Surgical History:  Procedure Laterality Date  right eye surgery  09/2010   retina in right eye   VAGINAL HYSTERECTOMY  2002   Dr. Kem Kays repair    FAMILY HISTORY Family History  Problem Relation Age of Onset   Diabetes Mother    Colon cancer Neg Hx    Stomach cancer Neg Hx     SOCIAL HISTORY Social History   Tobacco Use   Smoking status: Never   Smokeless tobacco: Never  Vaping Use   Vaping Use: Never used  Substance Use Topics   Alcohol use: No   Drug use: No         OPHTHALMIC EXAM:  Base Eye Exam     Visual Acuity (ETDRS)       Right Left   Dist cc 20/40 20/30 -1 +2   Dist ph cc NI     Correction: Glasses         Tonometry (Tonopen, 9:53 AM)       Right  Left   Pressure 12 12         Pupils       Pupils APD   Right PERRL None   Left PERRL None         Visual Fields       Left Right    Full Full         Extraocular Movement       Right Left    Full Full         Neuro/Psych     Oriented x3: Yes   Mood/Affect: Normal         Dilation     Left eye: 2.5% Phenylephrine, 1.0% Mydriacyl @ 9:53 AM           Slit Lamp and Fundus Exam     External Exam       Right Left   External Normal Normal         Slit Lamp Exam       Right Left   Lids/Lashes Normal Normal   Conjunctiva/Sclera White and quiet White and quiet   Cornea Clear Clear   Anterior Chamber Deep and quiet Deep and quiet   Iris Round and reactive Round and reactive   Lens Posterior chamber intraocular lens Posterior chamber intraocular lens   Anterior Vitreous Normal Normal         Fundus Exam       Right Left   Posterior Vitreous  Posterior vitreous detachment, Central vitreous floaters   Disc  Normal   C/D Ratio  0.7   Macula  Microaneurysms, increased macular thickening, Cystoid macular edema, Moderate clinically significant macular edema   Vessels  NPDR, moderate   Periphery  Normal            IMAGING AND PROCEDURES  Imaging and Procedures for 07/28/21  OCT, Retina - OU - Both Eyes       Right Eye Quality was good. Scan locations included subfoveal. Central Foveal Thickness: 301. Progression has improved. Findings include abnormal foveal contour, cystoid macular edema.   Left Eye Quality was good. Scan locations included subfoveal. Central Foveal Thickness: 228. Progression has improved. Findings include abnormal foveal contour, cystoid macular edema.   Notes OS vastly improved 7 weeks post injection antivegF for center involved CSME.,  Repeat injection Eylea OS today to maintain  OD, overall improved as well region superotemporal today at 6-week follow-up, which will be chronic with cavitary old CME.      Intravitreal Injection, Pharmacologic Agent -  OS - Left Eye       Time Out 07/28/2021. 10:29 AM. Confirmed correct patient, procedure, site, and patient consented.   Anesthesia Topical anesthesia was used. Anesthetic medications included Akten 3.5%, Lidocaine 4%.   Procedure Preparation included Tobramycin 0.3%, 10% betadine to eyelids, 5% betadine to ocular surface, Ofloxacin . A 30 gauge needle was used.   Injection: 2 mg aflibercept 2 MG/0.05ML   Route: Intravitreal, Site: Left Eye   NDC: [id number], Lot: [id number], Waste: 0 mL   Post-op Post injection exam found visual acuity of at least counting fingers. The patient tolerated the procedure well. There were no complications. The patient received written and verbal post procedure care education. Post injection medications included gentamicin.              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, with resulting large cavitary lesion temporally likely baseline forever, but has worsened in the past when off of antivegF due to the superimposed diabetic retinopathy changes  Moderate nonproliferative diabetic retinopathy of right eye with macular edema (HCC) OD vastly improved, large cavitary portion of CME will not manage as this is part of the old chronic MAC-TEL prior to ongoing CPAP therapy for sleep apnea  OSA on CPAP Continues excellent compliance  Type 2 macular telangiectasis, left While diabetic macular edema is treated effectively, there is only minimal CME changes temporally OS     ICD-10-CM   1. Moderate nonproliferative diabetic retinopathy of left eye with macular edema associated with type 2 diabetes mellitus (HCC)  [id number] OCT, Retina - OU - Both Eyes    Intravitreal Injection, Pharmacologic Agent - OS - Left Eye    aflibercept (EYLEA) SOLN 2 mg    2. Retinal telangiectasia of right eye  H35.071     3. Moderate nonproliferative diabetic retinopathy of right eye with  macular edema associated with type 2 diabetes mellitus ([place])  E11.3311     4. OSA on CPAP  G47.33    Z99.89     5. Type 2 macular telangiectasis, left  H35.072       1.  Bilateral moderate NPDR with CSME OU superimposed upon type II MAC-TEL longstanding therapy.  Type II MAC-TEL component of disease is controlled on ongoing sleep apnea therapy with CPAP.  Parafoveal CSME however does improved and stabilize, OS with intravitreal Eylea today at 7-week interval next visit will be in 8 weeks OS  2.  OD also with controlled CSME, follow-up as scheduled  3.  Ophthalmic Meds Ordered this visit:  Meds ordered this encounter  Medications   aflibercept (EYLEA) SOLN 2 mg       Return in about 8 weeks (around 09/22/2021) for dilate, OS, EYLEA OCT,, and follow-up OD as scheduled.  There are no Patient Instructions on file for this visit.   Explained the diagnoses, plan, and follow up with the patient and they expressed understanding.  Patient expressed understanding of the importance of proper follow up care.   Clent Demark Morine Kohlman M.D. Diseases & Surgery of the Retina and Vitreous [facility]ina [facility] Diabetic [place] 07/28/21     Abbreviations: M myopia (nearsighted); A astigmatism; H hyperopia (farsighted); P presbyopia; Mrx spectacle prescription;  CTL contact lenses; OD right eye; OS left eye; OU both eyes  XT exotropia; ET esotropia; PEK punctate epithelial keratitis; PEE punctate epithelial erosions; DES dry eye syndrome; MGD meibomian gland dysfunction; ATs artificial tears; PFAT's preservative free artificial tears; [place] nuclear sclerotic cataract; PSC posterior  subcapsular cataract; ERM epi-retinal membrane; PVD posterior vitreous detachment; RD retinal detachment; DM diabetes mellitus; DR diabetic retinopathy; NPDR non-proliferative diabetic retinopathy; PDR proliferative diabetic retinopathy; CSME clinically significant macular edema; DME diabetic macular edema; dbh dot blot hemorrhages; CWS  cotton wool spot; POAG primary open angle glaucoma; C/D cup-to-disc ratio; HVF humphrey visual field; GVF goldmann visual field; OCT optical coherence tomography; IOP intraocular pressure; BRVO Branch retinal vein occlusion; CRVO central retinal vein occlusion; CRAO central retinal artery occlusion; BRAO branch retinal artery occlusion; RT retinal tear; SB scleral buckle; PPV pars plana vitrectomy; VH Vitreous hemorrhage; PRP panretinal laser photocoagulation; IVK intravitreal kenalog; VMT vitreomacular traction; MH Macular hole;  NVD neovascularization of the disc; NVE neovascularization elsewhere; AREDS age related eye disease study; ARMD age related macular degeneration; POAG primary open angle glaucoma; EBMD epithelial/anterior basement membrane dystrophy; ACIOL anterior chamber intraocular lens; IOL intraocular lens; PCIOL posterior chamber intraocular lens; Phaco/IOL phacoemulsification with intraocular lens placement; [place] photorefractive keratectomy; LASIK laser assisted in situ keratomileusis; HTN hypertension; DM diabetes mellitus; COPD chronic obstructive pulmonary disease

## 2021-07-28 NOTE — ED Provider Notes (Signed)
[facility]ERGENC[facility] DEPT Provider Note   CSN: [id number] Arrival date & time: 07/28/21  1700     History PMH: HLD, HTN, Osteopenia, DM2, OSA Chief Complaint  Patient presents with   Fall    Whitney [name] is a 87 y.o. female. The emergency department today after a fall.  She says she was on a stepladder and was reaching up she lost her balance and fell sideways.  She says she hit the left side of her rib cage and abdomen onto a close hamper.  She denies hitting her head or any loss of consciousness.  She has had 10 out of 10 pain in her left side sitting down to the left upper quadrant of abdomen ever since then.  She says it hurts worse with deep breaths.  Denies any shortness of breath or chest pain.  No nausea or vomiting.  Denies any history of blood thinners.  HPI     Home Medications Prior to Admission medications   Medication Sig Start Date End Date Taking? Authorizing Provider  HYDROcodone-acetaminophen (NORCO/VICODIN) 5-325 MG tablet Take 1 tablet by mouth every 4 (four) hours as needed for up to 5 days. 07/28/21 08/02/21 Yes Christiana Gurevich, Adora Fridge, PA-C  Ascorbic Acid (VITAMIN C) 100 MG tablet Take 100 mg by mouth daily.    [provider]  aspirin 81 MG tablet Take 1 tablet (81 mg total) by mouth daily. 07/06/18   [name], MD  blood glucose meter kit and supplies KIT Use to test blood sugar up to 3 times daily. DX E11.9 02/03/21   [name], MD  Calcium Carb-Cholecalciferol (CALCIUM-VITAMIN D) 500-200 MG-UNIT tablet Take 1 tablet by mouth 2 (two) times daily with a meal. 07/06/18   [name], MD  diclofenac Sodium (VOLTAREN) 1 % GEL Apply 4 g topically 4 (four) times daily. 01/04/20   [name], MD  dorzolamide-timolol (COSOPT) 22.3-6.8 MG/ML ophthalmic solution Instill 1 drop in each eye twice a day 06/24/21   [name], MD  DUREZOL 0.05 % [facility]  12/10/14   [provider]  empagliflozin  (JARDIANCE) 25 MG TABS tablet Take 1 tablet (25 mg total) by mouth daily before breakfast. 08/07/20   [name], MD  glimepiride (AMARYL) 4 MG tablet TAKE 1 TABLET EVERY DAY BEFORE BREAKFAST 12/23/20   [name], MD  Lancet Devices [facility]) lancets Use to test blood sugar up to 3 times a day. DX E11.9 07/15/15   [name], MD  lisinopril-hydrochlorothiazide (ZESTORETIC) 20-12.5 MG tablet Take 1 tablet by mouth daily. 03/02/21   [name], MD  metFORMIN (GLUCOPHAGE) 1000 MG tablet Take 1 tablet (1,000 mg total) by mouth 2 (two) times daily with a meal. 03/02/21   [name], MD  Multiple Vitamin (MULTI VITAMIN DAILY PO) Take one by mouth daily 06/06/13   [provider]  [facility] VERIO test strip Use to test blood sugar 3 times a day 12/17/20   [name], MD  simvastatin (ZOCOR) 20 MG tablet Take 1 tablet (20 mg total) by mouth at bedtime. 12/23/20   [name], MD  zinc gluconate 50 MG tablet Take 50 mg by mouth daily.    [provider]      Allergies    Patient has no known allergies.    Review of Systems   Review of Systems  Respiratory:  Negative for cough and shortness of breath.   Cardiovascular:  Negative for chest pain.  Gastrointestinal:  Positive for abdominal pain. Negative for nausea and vomiting.  Musculoskeletal:  Positive for arthralgias.  Neurological:  Negative for headaches.  All other systems reviewed and are negative.  Physical Exam Updated Vital Signs BP (!) 152/80   Pulse 60   Temp 98.6 F (37 C)   Resp 18   SpO2 94%  Physical Exam Vitals and nursing note reviewed.  Constitutional:      General: She is not in acute distress.    Appearance: Normal appearance. She is well-developed. She is not ill-appearing, toxic-appearing or diaphoretic.  HENT:     Head: Normocephalic and atraumatic.     Nose: No nasal deformity.     Mouth/Throat:     Lips: Pink. No  lesions.  Eyes:     General: Gaze aligned appropriately. No scleral icterus.       Right eye: No discharge.        Left eye: No discharge.     Conjunctiva/sclera: Conjunctivae normal.     Right eye: Right conjunctiva is not injected. No exudate or hemorrhage.    Left eye: Left conjunctiva is not injected. No exudate or hemorrhage. Cardiovascular:     Rate and Rhythm: Normal rate and regular rhythm.     Pulses: Normal pulses.     Heart sounds: Normal heart sounds. No murmur heard.   No friction rub. No gallop.  Pulmonary:     Effort: Pulmonary effort is normal. No respiratory distress.     Breath sounds: Normal breath sounds. No stridor. No wheezing, rhonchi or rales.  Chest:     Chest wall: Tenderness present.  Abdominal:     General: Abdomen is flat. There is no distension.     Palpations: Abdomen is soft.     Tenderness: There is abdominal tenderness. There is left CVA tenderness. There is no right CVA tenderness, guarding or rebound.     Comments: There is some left upper quadrant tenderness overlying the spleen.  No splenomegaly.  Left CVA tenderness also present.  Musculoskeletal:     Comments: Significant tenderness overlying the left rib cage on the lateral aspect of the approximately rib cage 5 through 12.  This extends into the anterior side as well.  No overlying bruising present.  No step-offs noted.  Skin:    General: Skin is warm and dry.  Neurological:     Mental Status: She is alert and oriented to person, place, and time.  Psychiatric:        Mood and Affect: Mood normal.        Speech: Speech normal.        Behavior: Behavior normal. Behavior is cooperative.    ED Results / Procedures / Treatments   Labs (all labs ordered are listed, but only abnormal results are displayed) Labs Reviewed  COMPREHENSIVE METABOLIC PANEL - Abnormal; Notable for the following components:      Result Value   Glucose, Bld 139 (*)    Calcium 11.0 (*)    Total Bilirubin 1.5 (*)     All other components within normal limits  CBC - Abnormal; Notable for the following components:   RBC 5.26 (*)    MCV 79.7 (*)    MCH 25.7 (*)    All other components within normal limits    EKG None  Radiology CT CHEST ABDOMEN PELVIS W CONTRAST  Result Date: 07/28/2021 CLINICAL DATA:  Blunt abdominal trauma. Fall. On a step ladder reaching up and  fell sideways onto close hamper in laundry room. Left ribcage and trunk pain. EXAM: CT CHEST, ABDOMEN, AND PELVIS WITH CONTRAST TECHNIQUE: Multidetector CT imaging of the chest, abdomen and pelvis was performed following the standard protocol during bolus administration of intravenous contrast. RADIATION DOSE REDUCTION: This exam was performed according to the departmental dose-optimization program which includes automated exposure control, adjustment of the mA and/or kV according to patient size and/or use of iterative reconstruction technique. CONTRAST:  24m OMNIPAQUE IOHEXOL 300 MG/ML  SOLN COMPARISON:  Chest two views 03/02/2015 FINDINGS: CT CHEST FINDINGS Cardiovascular: Heart size is mildly enlarged. No pericardial effusion. Coronary artery calcifications are seen. No thoracic aortic aneurysm. Mild calcifications within the aortic arch. No large central pulmonary embolism. Mediastinum/Nodes: No axillary, mediastinal, or hilar pathologically enlarged lymph nodes by CT criteria. Subcentimeter incidental low-density nodules within the left thyroid lobe. No follow-up imaging is recommended. Mildly patulous esophagus with mild internal fluid. Lungs/Pleura: The central airways are patent. There is mild volume loss and curvilinear and ground-glass density within the left-greater-than-right posteroinferior lower lobes likely subsegmental atelectasis and/or scarring. Benign calcified granuloma within the superomedial right upper lobe (axial series 4, image 33). No pleural effusion or pneumothorax. Musculoskeletal: Mild dextrocurvature of the upper thoracic  spine minimal levocurvature of the midthoracic spine and minimal dextrocurvature of the lower thoracic spine. Moderate multilevel degenerative disc changes of the thoracic spine. Nondisplaced acute posterolateral left tenth rib fracture. CT ABDOMEN PELVIS FINDINGS Hepatobiliary: Smooth liver contours. The gallbladder is under distended limiting evaluation. No focal liver lesion is seen. Pancreas: No acute posttraumatic abnormality. No pancreatic ductal dilatation. Spleen: Unremarkable. Adrenals/Urinary Tract: There is thickening of the medial and lateral limbs of the right adrenal gland which maintain their adreniform shape. There is also more mild thickening of the lateral greater than medial limbs of the left adrenal gland. This may be secondary to multiple subcentimeter nodules versus adrenal hypertrophy. No follow-up imaging recommended. The kidneys enhance uniformly and are symmetric in size without hydronephrosis. No focal urinary bladder wall thickening. Stomach/Bowel: Moderate stool is seen throughout the colon. There is fecal matter within the distal ileum suggesting chronic slow bowel transit. No dilated loops of bowel to indicate bowel obstruction. Vascular/Lymphatic: No abdominal aortic aneurysm. Normal variant takeoff of the right renal artery from the conjoined origin of the celiac axis and superior mesenteric artery. The left renal artery originates from the abdominal aorta. No abdominal aortic aneurysm. Mild-to-moderate atherosclerotic calcifications. The major intra-abdominal aortic branch vessels are patent. No mesenteric retroperitoneal or pelvic lymphadenopathy. Reproductive: The uterus appears to be surgically absent. No gross adnexal abnormality. Other: No ventral abdominal wall hernia. No free air or free fluid is seen within the abdomen or pelvis. Musculoskeletal: Moderate multilevel degenerative disc and joint changes. Moderate to severe left greater than right sacroiliac joint space  narrowing and subchondral sclerosis. IMPRESSION:: IMPRESSION: 1. Nondisplaced acute fracture of the posterolateral left tenth rib. Left-greater-than-right basilar hypoinflation/atelectasis. No pneumothorax. 2. Moderate stool throughout the colon with stool also seen within the ileum suggesting chronic slow bowel transit. 3. No acute abnormality is seen within the abdomen or pelvis. Aortic Atherosclerosis (ICD10-I70.0). Electronically Signed   By: RYvonne KendallM.D.   On: 07/28/2021 19:24   Intravitreal Injection, Pharmacologic Agent - OS - Left Eye  Result Date: 07/28/2021 Time Out 07/28/2021. 10:29 AM. Confirmed correct patient, procedure, site, and patient consented. Anesthesia Topical anesthesia was used. Anesthetic medications included Akten 3.5%, Lidocaine 4%. Procedure Preparation included Tobramycin 0.3%, 10% betadine to eyelids, 5% betadine  to ocular surface, Ofloxacin . A 30 gauge needle was used. Injection: 2 mg aflibercept 2 MG/0.05ML   Route: Intravitreal, Site: Left Eye   NDC: [id number], Lot: [id number], Waste: 0 mL Post-op Post injection exam found visual acuity of at least counting fingers. The patient tolerated the procedure well. There were no complications. The patient received written and verbal post procedure care education. Post injection medications included gentamicin.   OCT, Retina - OU - Both Eyes  Result Date: 07/28/2021 Right Eye Quality was good. Scan locations included subfoveal. Central Foveal Thickness: 301. Progression has improved. Findings include abnormal foveal contour, cystoid macular edema. Left Eye Quality was good. Scan locations included subfoveal. Central Foveal Thickness: 228. Progression has improved. Findings include abnormal foveal contour, cystoid macular edema. Notes OS vastly improved 7 weeks post injection antivegF for center involved CSME.,  Repeat injection Eylea OS today to maintain OD, overall improved as well region superotemporal today at 6-week  follow-up, which will be chronic with cavitary old CME.   Procedures Procedures    Medications Ordered in ED Medications  fentaNYL (SUBLIMAZE) injection 50 mcg (50 mcg Intravenous Given 07/28/21 1833)  iohexol (OMNIPAQUE) 300 MG/ML solution 75 mL (75 mLs Intravenous Contrast Given 07/28/21 1845)    ED Course/ Medical Decision Making/ A&P                           Medical Decision Making Amount and/or Complexity of Data Reviewed Labs: ordered. Radiology: ordered.  Risk Prescription drug management.    MDM  This is a 87 y.o. female who presents to the ED after mechanical fall that occurred earlier today and complaints of left-sided rib cage and left upper abdominal pain. The differential of this patient includes but is not limited to rib fracture, lung contusion, pneumothorax, splenic rupture, etc  My Impression, Plan, and ED Course: Is well-appearing with normal vitals.  Sounds like this was a mechanical fall with no dizziness or exasperating factors that led to the fall.  She is got left rib cage pain and left upper quadrant abdominal pain.  Hurts worse with breathing.  Concerned that she has a rib fracture.  Will obtain screening chest x-ray, but given patient's age and likelihood for multiple rib fractures and complications, we will also go ahead and obtain CT chest and abdomen.  I personally ordered, reviewed, and interpreted all laboratory work and imaging and agree with radiologist interpretation. Results interpreted below:  Labs with stable hemoglobin.  No other concerning findings on labs. 1. Nondisplaced acute fracture of the posterolateral left tenth rib.  Left-greater-than-right basilar hypoinflation/atelectasis. No  pneumothorax.  2. Moderate stool throughout the colon with stool also seen within  the ileum suggesting chronic slow bowel transit.  3. No acute abnormality is seen within the abdomen or pelvis.     CT reveals a single nondisplaced fracture of the left  10th rib.  This is consistent with patient presentation.  There is no evidence of any splenic abnormality or any intra-abdominal traumatic injury.  Since patient is stable and is oxygenating well on room air.  Recommend supportive treatment at home.  We will teach her how to use incentive spirometer.  Pain medication will be provided at discharge.  Recommend close follow-up with primary care provider.  Strict return precautions provided for rib fracture complications.   Charting Requirements Additional history is obtained from:  Independent historian External Records from outside source obtained and reviewed including: Review telephone note referring  patient to the emergency department.  Recent office visit on April 11.  She had normal labs at this time. Social Determinants of Health:  none Pertinant PMH that complicates patient's illness: Age, osteopenia  Patient Care Problems that were addressed during this visit: - Rib Fracture: Acute illness with complication Medications given in ED: Fentanyl Reevaluation of the patient after these medicines showed that the patient improved I have reviewed home medications and made changes accordingly. Prescribed Norco Disposition: supportive tx. Return precautions  This is a supervised visit with my attending physician, Dr. Rogene Houston. We have discussed this patient and they have altered the plan as needed.  Portions of this note were generated with L[employer]. Dictation errors may occur despite best attempts at proofreading.    Final Clinical Impression(s) / ED Diagnoses Final diagnoses:  Closed fracture of one rib of left side, initial encounter    Rx / DC Orders ED Discharge Orders          Ordered    HYDROcodone-acetaminophen (NORCO/VICODIN) 5-325 MG tablet  Every 4 hours PRN        07/28/21 2017              Adolphus Birchwood, PA-C 07/28/21 2337    Fredia Sorrow, MD 08/01/21 [phone]

## 2021-07-28 NOTE — Discharge Instructions (Addendum)
We found that you had a rib fracture on the left side.  Typically these will heal on their own.  The most ported thing is managing your pain and making sure that you are using her incentive spirometer.  ?I prescribed you a pain medication called Norco.  This is an opioid medication so may make you sleepy or drowsy.  Please do not take this prior to driving.  Please only take as prescribed.  You should only take this as needed for severe pain. You can also take Tylenol for pain. ?When she to follow-up with your primary care doctor in the next week for reevaluation. ?If you develop concerning symptoms such as fevers, productive cough, shortness of breath, chest pain, or worsening confusion you should return to the emergency department to be reevaluated. ?

## 2021-07-28 NOTE — ED Notes (Signed)
Pt verbalizes understanding of discharge instructions. Opportunity for questioning and answers were provided. Pt discharged from ED to home with family.    

## 2021-07-28 NOTE — Assessment & Plan Note (Signed)
While diabetic macular edema is treated effectively, there is only minimal CME changes temporally OS ?

## 2021-08-03 ENCOUNTER — Encounter: Payer: Self-pay | Admitting: Internal Medicine

## 2021-08-03 ENCOUNTER — Ambulatory Visit (INDEPENDENT_AMBULATORY_CARE_PROVIDER_SITE_OTHER): Payer: HMO | Admitting: Internal Medicine

## 2021-08-03 DIAGNOSIS — I10 Essential (primary) hypertension: Secondary | ICD-10-CM

## 2021-08-03 DIAGNOSIS — E1169 Type 2 diabetes mellitus with other specified complication: Secondary | ICD-10-CM | POA: Diagnosis not present

## 2021-08-03 DIAGNOSIS — E785 Hyperlipidemia, unspecified: Secondary | ICD-10-CM

## 2021-08-03 DIAGNOSIS — R0781 Pleurodynia: Secondary | ICD-10-CM | POA: Diagnosis not present

## 2021-08-03 MED ORDER — LISINOPRIL-HYDROCHLOROTHIAZIDE 20-12.5 MG PO TABS
1.0000 | ORAL_TABLET | Freq: Every day | ORAL | 3 refills | Status: DC
Start: 2021-08-03 — End: 2022-07-22

## 2021-08-03 MED ORDER — EMPAGLIFLOZIN 25 MG PO TABS: 25.0000 mg | ORAL_TABLET | Freq: Every day | ORAL | 3 refills | Status: DC

## 2021-08-03 MED ORDER — GLIMEPIRIDE 4 MG PO TABS: ORAL_TABLET | ORAL | 3 refills | Status: DC

## 2021-08-03 MED ORDER — SIMVASTATIN 20 MG PO TABS: 20.0000 mg | ORAL_TABLET | Freq: Every day | ORAL | 3 refills | Status: DC

## 2021-08-03 MED ORDER — METFORMIN HCL 1000 MG PO TABS: 1000.0000 mg | ORAL_TABLET | Freq: Two times a day (BID) | ORAL | 3 refills | Status: DC

## 2021-08-03 NOTE — Assessment & Plan Note (Signed)
She did need refills on all medications which was done today. Reviewed last labs and this is appropriate. Refilled amaryl and jardiance and metformin. No low sugar episodes recently.  ?

## 2021-08-03 NOTE — Patient Instructions (Signed)
Keep using tylenol for pain up to 3000 mg daily (usually 6 pills of the 500 mg tylenol also called acetaminophen).  ? ? ?

## 2021-08-03 NOTE — Assessment & Plan Note (Signed)
BP is at goal and did not contribute to recent fall. We will plan to continue lisinopril/hctz 20/12.5 mg daily. Recent labs stable and reviewed.  ?

## 2021-08-03 NOTE — Assessment & Plan Note (Signed)
Needs refill on simvastatin 20 mg daily and this was done at visit as we plan to continue.  ?

## 2021-08-03 NOTE — Assessment & Plan Note (Signed)
She is encouraged to use tylenol for pain otc up to maximum dosing 3000 mg in 24 hours. She is using incentive spirometer to help avoid pneumonia. Advised of 4-6 week healing timeline and signs/symptoms to monitor for pneumonia. We counseled on need to move more used items to shelving she can reach and avoid getting on step stool/ladder. Her daughter was present and supportive of this plan to improve safety.  ?

## 2021-08-03 NOTE — Progress Notes (Signed)
? ?  Subjective:  ? ?Patient ID: Whitney [name], female    DOB: 23-Jun-1934, 87 y.o.   MRN: [id number] ? ?HPI ?The patient is an 87 YO female coming in for ER follow up (after fall, with 1 rib fracture). Using tylenol currently for pain and about 1 week out from fall.  ? ?Review of Systems  ?Constitutional:  Positive for activity change.  ?HENT: Negative.    ?Eyes: Negative.   ?Respiratory:  Negative for cough, chest tightness and shortness of breath.   ?Cardiovascular:  Positive for chest pain. Negative for palpitations and leg swelling.  ?Gastrointestinal:  Negative for abdominal distention, abdominal pain, constipation, diarrhea, nausea and vomiting.  ?Musculoskeletal: Negative.   ?Skin: Negative.   ?Neurological: Negative.   ?Psychiatric/Behavioral: Negative.    ? ?Objective:  ?Physical Exam ?Constitutional:   ?   Appearance: She is well-developed.  ?HENT:  ?   Head: Normocephalic and atraumatic.  ?Cardiovascular:  ?   Rate and Rhythm: Normal rate and regular rhythm.  ?Pulmonary:  ?   Effort: Pulmonary effort is normal. No respiratory distress.  ?   Breath sounds: Normal breath sounds. No wheezing or rales.  ?Chest:  ?   Chest wall: Tenderness present.  ?Abdominal:  ?   General: Bowel sounds are normal. There is no distension.  ?   Palpations: Abdomen is soft.  ?   Tenderness: There is no abdominal tenderness. There is no rebound.  ?Musculoskeletal:  ?   Cervical back: Normal range of motion.  ?Skin: ?   General: Skin is warm and dry.  ?Neurological:  ?   Mental Status: She is alert and oriented to person, place, and time.  ?   Coordination: Coordination normal.  ? ? ?Vitals:  ? 08/03/21 0849  ?BP: 126/80  ?Pulse: 62  ?Resp: 18  ?SpO2: 95%  ?Weight: 144 lb 6.4 oz (65.5 kg)  ?Height: '5\' 1"'$  (1.549 m)  ? ? ?This visit occurred during the SARS-CoV-2 public health emergency.  Safety protocols were in place, including screening questions prior to the visit, additional usage of staff PPE, and extensive cleaning of exam  room while observing appropriate contact time as indicated for disinfecting solutions.  ? ?Assessment & Plan:  ?Visit time 20 minutes in face to face communication with patient and coordination of care, additional 10 minutes spent in record review, coordination or care, ordering tests, communicating/referring to other healthcare professionals, documenting in medical records all on the same day of the visit for total time 30 minutes spent on the visit.  ? ?

## 2021-08-09 DIAGNOSIS — E1159 Type 2 diabetes mellitus with other circulatory complications: Secondary | ICD-10-CM

## 2021-08-09 DIAGNOSIS — I10 Essential (primary) hypertension: Secondary | ICD-10-CM

## 2021-08-09 DIAGNOSIS — Z7984 Long term (current) use of oral hypoglycemic drugs: Secondary | ICD-10-CM

## 2021-08-09 DIAGNOSIS — E785 Hyperlipidemia, unspecified: Secondary | ICD-10-CM

## 2021-08-11 ENCOUNTER — Ambulatory Visit (INDEPENDENT_AMBULATORY_CARE_PROVIDER_SITE_OTHER): Payer: HMO | Admitting: Ophthalmology

## 2021-08-11 ENCOUNTER — Encounter (INDEPENDENT_AMBULATORY_CARE_PROVIDER_SITE_OTHER): Payer: Self-pay | Admitting: Ophthalmology

## 2021-08-11 DIAGNOSIS — E113312 Type 2 diabetes mellitus with moderate nonproliferative diabetic retinopathy with macular edema, left eye: Secondary | ICD-10-CM

## 2021-08-11 DIAGNOSIS — E113311 Type 2 diabetes mellitus with moderate nonproliferative diabetic retinopathy with macular edema, right eye: Secondary | ICD-10-CM | POA: Diagnosis not present

## 2021-08-11 MED ORDER — BEVACIZUMAB 2.5 MG/0.1ML IZ SOSY
2.5000 mg | PREFILLED_SYRINGE | INTRAVITREAL | Status: AC | PRN
  Administered 2021-08-11: 2.5 mg via INTRAVITREAL

## 2021-08-11 NOTE — Assessment & Plan Note (Signed)
Today at 8 weeks post most recent injection, Avastin for CSME, stable acuity in the interval ?

## 2021-08-11 NOTE — Assessment & Plan Note (Signed)
OS today at 2 weeks post most recent injection, follow-up as scheduled ?

## 2021-08-11 NOTE — Progress Notes (Signed)
08/11/2021     CHIEF COMPLAINT Patient presents for  Chief Complaint  Patient presents with   Diabetic Retinopathy with Macular Edema      HISTORY OF PRESENT ILLNESS: Whitney [name] is a 87 y.o. female who presents to the clinic today for:   HPI   History of diabetic macular edema OU, 2 weeks post most recent injection OS doing well,  OD currently interval follow-up of 8 weeks.  Doing well,  Interval changes patient did have a fall suffering a fractured rib, recovering nicely Last edited by Hurman Horn, MD on 08/11/2021 10:11 AM.      Referring physician: [name], MD [place],  [place] [address]  HISTORICAL INFORMATION:   Selected notes from the MEDICAL RECORD NUMBER    Lab Results  Component Value Date   HGBA1C 7.9 (H) 07/21/2021     CURRENT MEDICATIONS: Current Outpatient Medications (Ophthalmic Drugs)  Medication Sig   dorzolamide-timolol (COSOPT) 22.3-6.8 MG/ML ophthalmic solution Instill 1 drop in each eye twice a day   DUREZOL 0.05 % EMUL    No current facility-administered medications for this visit. (Ophthalmic Drugs)   Current Outpatient Medications (Other)  Medication Sig   Ascorbic Acid (VITAMIN C) 100 MG tablet Take 100 mg by mouth daily.   aspirin 81 MG tablet Take 1 tablet (81 mg total) by mouth daily.   blood glucose meter kit and supplies KIT Use to test blood sugar up to 3 times daily. DX E11.9   Calcium Carb-Cholecalciferol (CALCIUM-VITAMIN D) 500-200 MG-UNIT tablet Take 1 tablet by mouth 2 (two) times daily with a meal.   diclofenac Sodium (VOLTAREN) 1 % GEL Apply 4 g topically 4 (four) times daily.   empagliflozin (JARDIANCE) 25 MG TABS tablet Take 1 tablet (25 mg total) by mouth daily before breakfast.   glimepiride (AMARYL) 4 MG tablet TAKE 1 TABLET EVERY DAY BEFORE BREAKFAST   Lancet Devices (ACCU-CHEK SOFTCLIX) lancets Use to test blood sugar up to 3 times a day. DX E11.9   lisinopril-hydrochlorothiazide  (ZESTORETIC) 20-12.5 MG tablet Take 1 tablet by mouth daily.   metFORMIN (GLUCOPHAGE) 1000 MG tablet Take 1 tablet (1,000 mg total) by mouth 2 (two) times daily with a meal.   Multiple Vitamin (MULTI VITAMIN DAILY PO) Take one by mouth daily   ONETOUCH VERIO test strip Use to test blood sugar 3 times a day   simvastatin (ZOCOR) 20 MG tablet Take 1 tablet (20 mg total) by mouth at bedtime.   zinc gluconate 50 MG tablet Take 50 mg by mouth daily.   No current facility-administered medications for this visit. (Other)      REVIEW OF SYSTEMS: ROS   Negative for: Constitutional, Gastrointestinal, Neurological, Skin, Genitourinary, Musculoskeletal, HENT, Endocrine, Cardiovascular, Eyes, Respiratory, Psychiatric, Allergic/Imm, Heme/Lymph Last edited by Hurman Horn, MD on 08/11/2021 10:08 AM.       ALLERGIES No Known Allergies  PAST MEDICAL HISTORY Past Medical History:  Diagnosis Date   Anxiety    Baker's cyst    Chest pain    Diabetes mellitus type 2, uncontrolled    Diabetes mellitus without complication (HCC)    Disc disease, degenerative, cervical    Diverticulosis of colon    DJD (degenerative joint disease) of knee    Facial pain, atypical    GERD (gastroesophageal reflux disease)    Glaucoma    Hypercholesteremia    Hypertension    Lumbar spondylosis    Obesity  Onychomycosis    Osteopenia    Past Surgical History:  Procedure Laterality Date   right eye surgery  09/2010   retina in right eye   VAGINAL HYSTERECTOMY  2002   Dr. Kem Kays repair    FAMILY HISTORY Family History  Problem Relation Age of Onset   Diabetes Mother    Colon cancer Neg Hx    Stomach cancer Neg Hx     SOCIAL HISTORY Social History   Tobacco Use   Smoking status: Never   Smokeless tobacco: Never  Vaping Use   Vaping Use: Never used  Substance Use Topics   Alcohol use: No   Drug use: No         OPHTHALMIC EXAM:  Base Eye Exam     Visual Acuity (ETDRS)       Right  Left   Dist cc 20/30 -1 20/25 +2         Tonometry (Tonopen, 10:10 AM)       Right Left   Pressure 10 12         Pupils       Pupils APD   Right PERRL None   Left PERRL None         Visual Fields       Left Right    Full Full         Extraocular Movement       Right Left    Full, Ortho Full, Ortho         Neuro/Psych     Oriented x3: Yes   Mood/Affect: Normal         Dilation     Right eye: 1.0% Mydriacyl, 2.5% Phenylephrine @ 11:02 AM           Slit Lamp and Fundus Exam     External Exam       Right Left   External Normal Normal         Slit Lamp Exam       Right Left   Lids/Lashes Normal Normal   Conjunctiva/Sclera White and quiet White and quiet   Cornea Clear Clear   Anterior Chamber Deep and quiet Deep and quiet   Iris Round and reactive Round and reactive   Lens Posterior chamber intraocular lens Posterior chamber intraocular lens   Anterior Vitreous Normal Normal         Fundus Exam       Right Left   Posterior Vitreous Normal    Disc Normal    C/D Ratio 0.55    Macula Microaneurysms,, trace clinical CSME    Vessels NPDR, moderate    Periphery Normal             IMAGING AND PROCEDURES  Imaging and Procedures for 08/11/21  OCT, Retina - OU - Both Eyes       Right Eye Quality was good. Scan locations included subfoveal. Central Foveal Thickness: 291. Progression has improved. Findings include abnormal foveal contour, cystoid macular edema.   Left Eye Quality was good. Scan locations included subfoveal. Central Foveal Thickness: 230. Progression has improved. Findings include abnormal foveal contour, cystoid macular edema.   Notes OS vastly improved 2 weeks post injection antivegF for center involved CSME.,  Pete evaluation OS as scheduled  OD, overall improved as well region superotemporal today at 8-week follow-up, which will be chronic with cavitary old CME.  We will need to maintain follow-up  interval right eye of 8 weeks, post injection today  Intravitreal Injection, Pharmacologic Agent - OD - Right Eye       Time Out 08/11/2021. 11:03 AM. Confirmed correct patient, procedure, site, and patient consented.   Anesthesia Topical anesthesia was used. Anesthetic medications included Lidocaine 4%.   Procedure Preparation included 10% betadine to eyelids, 5% betadine to ocular surface, Ofloxacin . A 30 gauge needle was used.   Injection: 2.5 mg bevacizumab 2.5 MG/0.1ML   Route: Intravitreal, Site: Right Eye   NDC: [phone], Lot: [id number]   Post-op Post injection exam found visual acuity of at least counting fingers. The patient tolerated the procedure well. There were no complications. The patient received written and verbal post procedure care education. Post injection medications included ocuflox.              ASSESSMENT/PLAN:  Moderate nonproliferative diabetic retinopathy of right eye with macular edema (HCC) Today at 8 weeks post most recent injection, Avastin for CSME, stable acuity in the interval  Moderate nonproliferative diabetic retinopathy of left eye with macular edema associated with type 2 diabetes mellitus ([place]) OS today at 2 weeks post most recent injection, follow-up as scheduled     ICD-10-CM   1. Moderate nonproliferative diabetic retinopathy of right eye with macular edema associated with type 2 diabetes mellitus (HCC)  E11.3311 OCT, Retina - OU - Both Eyes    Intravitreal Injection, Pharmacologic Agent - OD - Right Eye    bevacizumab (AVASTIN) SOSY 2.5 mg    2. Moderate nonproliferative diabetic retinopathy of left eye with macular edema associated with type 2 diabetes mellitus ([place])  [id number]       1.  OD persistence of treatment effect 8 weeks post Avastin for diabetic CSME.  Treatment much more effective now that nightly hypoxia is avoided with compliant use of CPAP for sleep apnea.  2.  OS also improved 2 weeks after most recent  injection Eylea, follow-up as scheduled  3.  Ophthalmic Meds Ordered this visit:  Meds ordered this encounter  Medications   bevacizumab (AVASTIN) SOSY 2.5 mg       Return in about 8 weeks (around 10/06/2021) for dilate, OD, AVASTIN OCT,, and follow-up OS Eylea OCT as scheduled.  There are no Patient Instructions on file for this visit.   Explained the diagnoses, plan, and follow up with the patient and they expressed understanding.  Patient expressed understanding of the importance of proper follow up care.   Clent Demark Manoj Enriquez M.D. Diseases & Surgery of the Retina and Vitreous [facility] 08/11/21     Abbreviations: M myopia (nearsighted); A astigmatism; H hyperopia (farsighted); P presbyopia; Mrx spectacle prescription;  CTL contact lenses; OD right eye; OS left eye; OU both eyes  XT exotropia; ET esotropia; PEK punctate epithelial keratitis; PEE punctate epithelial erosions; DES dry eye syndrome; MGD meibomian gland dysfunction; ATs artificial tears; PFAT's preservative free artificial tears; [place] nuclear sclerotic cataract; PSC posterior subcapsular cataract; ERM epi-retinal membrane; PVD posterior vitreous detachment; RD retinal detachment; DM diabetes mellitus; DR diabetic retinopathy; NPDR non-proliferative diabetic retinopathy; PDR proliferative diabetic retinopathy; CSME clinically significant macular edema; DME diabetic macular edema; dbh dot blot hemorrhages; CWS cotton wool spot; POAG primary open angle glaucoma; C/D cup-to-disc ratio; HVF humphrey visual field; GVF goldmann visual field; OCT optical coherence tomography; IOP intraocular pressure; BRVO Branch retinal vein occlusion; CRVO central retinal vein occlusion; CRAO central retinal artery occlusion; BRAO branch retinal artery occlusion; RT retinal tear; SB scleral buckle; PPV pars plana vitrectomy; VH Vitreous hemorrhage; PRP panretinal  laser photocoagulation; IVK intravitreal kenalog; VMT vitreomacular  traction; MH Macular hole;  NVD neovascularization of the disc; NVE neovascularization elsewhere; AREDS age related eye disease study; ARMD age related macular degeneration; POAG primary open angle glaucoma; EBMD epithelial/anterior basement membrane dystrophy; ACIOL anterior chamber intraocular lens; IOL intraocular lens; PCIOL posterior chamber intraocular lens; Phaco/IOL phacoemulsification with intraocular lens placement; [place] photorefractive keratectomy; LASIK laser assisted in situ keratomileusis; HTN hypertension; DM diabetes mellitus; COPD chronic obstructive pulmonary disease

## 2021-08-17 ENCOUNTER — Telehealth: Payer: Self-pay | Admitting: Internal Medicine

## 2021-08-17 ENCOUNTER — Ambulatory Visit (INDEPENDENT_AMBULATORY_CARE_PROVIDER_SITE_OTHER): Payer: HMO | Admitting: *Deleted

## 2021-08-17 DIAGNOSIS — E1169 Type 2 diabetes mellitus with other specified complication: Secondary | ICD-10-CM

## 2021-08-17 NOTE — Telephone Encounter (Signed)
Patient had fall on 07/28/2021 - she went to [facility] at time - she has found a knot on her left leg now and is concerned about it.  Please advise ?

## 2021-08-17 NOTE — Patient Instructions (Signed)
Visit Information ? ?Linsy, thank you for taking time to talk with me today. Please don't hesitate to contact me if I can be of assistance to you before our next scheduled telephone appointment ? ?Below are the goals we discussed today:  ?Patient Self-Care Activities: ?Patient Whitney [name] will: ?Take medications as prescribed ?Attend all scheduled provider appointments ?Call pharmacy for medication refills ?Call provider office for new concerns or questions ?Continue to check fasting (first thing in the morning, before eating) blood sugars at home: your goal for fasting blood sugars are between 130-140 ?Continue to occasionally check after-eating blood sugars 2 hours after a meal: your goal for after eating blood sugars are between 160-180 ?As we discussed today: Dr. Sharlet Salina clarified that your A1-C goal is 8.0 ?Continue to follow heart healthy, low salt, low cholesterol, carbohydrate-modified, low sugar diet: keep up the good work trying to make sure you control your portions ?Continue staying as active as possible ?Please read over the attached information about preventing falls at home- continue to use your cane as needed and take extra precautions to prevent falls at home ? ?Our next scheduled telephone follow up visit/ appointment is scheduled on: Wednesday, October 21, 2021 at 11:30 am- This is a PHONE [place] appointment ? ?If you need to cancel or re-schedule our visit, please call [phone] and our care guide team will be happy to assist you. ?  ?I look forward to hearing about your progress. ?  ?[name], RN, BSN, CCRN Alumnus ?[place] ?([phone]: direct office ? ?If you are experiencing a Mental Health or [place] or need someone to talk to, please  ?call the Suicide and Crisis Lifeline: 988 ?call the [place] National Suicide Prevention Lifeline: 276 474 7160 or TTY: 646-195-4466 TTY 7808052703) to talk to a trained counselor ?call  1-800-273-TALK (toll free, 24 hour hotline) ?go to [facility] [address], [place] [phone]) ?call 911  ? ?The patient verbalized understanding of instructions, educational materials, and care plan provided today and agreed to receive a mailed copy of patient instructions, educational materials, and care plan ? ?Understanding Your Risk for Falls ?Each year, millions of people have serious injuries from falls. It is important to understand your risk for falling. Talk with your health care provider about your risk and what you can do to lower it. There are actions you can take at home to lower your risk and prevent falls. ?If you do have a serious fall, make sure to tell your health care provider. Falling once raises your risk of falling again. ?How can falls affect me? ?Serious injuries from falls are common. These include: ?Broken bones, such as hip fractures. ?Head injuries, such as traumatic brain injuries (TBI) or concussion. ?A fear of falling can cause you to avoid activities and stay at home. This can make your muscles weaker and actually raise your risk for a fall. ?What can increase my risk? ?There are a number of risk factors that increase your risk for falling. The more risk factors you have, the higher your risk of falling. Serious injuries from a fall happen most often to people older than age 26. Children and young adults ages 47-29 are also at higher risk. ?Common risk factors include: ?Weakness in the lower body. ?Lack (deficiency) of vitamin D. ?Being generally weak or confused due to long-term (chronic) illness. ?Dizziness or balance problems. ?Poor vision. ?Medicines that cause dizziness or drowsiness. These can include medicines  for your blood pressure, heart, anxiety, insomnia, or edema, as well as pain medicines and muscle relaxants. ?Other risk factors include: ?Drinking alcohol. ?Having had a fall in the past. ?Having depression. ?Having foot  pain or wearing improper footwear. ?Working at a dangerous job. ?Having any of the following in your home: ?Tripping hazards, such as floor clutter or loose rugs. ?Poor lighting. ?Pets. ?Having dementia or memory loss. ?What actions can I take to lower my risk of falling? ? ?  ? ?Physical activity ?Maintain physical fitness. Do strength and balance exercises. Consider taking a regular class to build strength and balance. Yoga and tai chi are good options. ?Vision ?Have your eyes checked every year and your vision prescription updated as needed. ?Walking aids and footwear ?Wear nonskid shoes. Do not wear high heels. ?Do not walk around the house in socks or slippers. ?Use a cane or walker as told by your health care provider. ?Home safety ?Attach secure railings on both sides of your stairs. ?Install grab bars for your tub, shower, and toilet. Use a bath mat in your tub or shower. ?Use good lighting in all rooms. Keep a flashlight near your bed. ?Make sure there is a clear path from your bed to the bathroom. Use night-lights. ?Do not use throw rugs. Make sure all carpeting is taped or tacked down securely. ?Remove all clutter from walkways and stairways, including extension cords. ?Repair uneven or broken steps. ?Avoid walking on icy or slippery surfaces. Walk on the grass instead of on icy or slick sidewalks. Use ice melt to get rid of ice on walkways. ?Use a cordless phone. ?Questions to ask your health care provider ?Can you help me check my risk for a fall? ?Do any of my medicines make me more likely to fall? ?Should I take a vitamin D supplement? ?What exercises can I do to improve my strength and balance? ?Should I make an appointment to have my vision checked? ?Do I need a bone density test to check for weak bones or osteoporosis? ?Would it help to use a cane or a walker? ?Where to find more information ?Centers for Disease Control and Prevention, [employer]AD[employer]: http://www.wolf.info/ ?Community-Based Fall Prevention Programs:  http://www.wolf.info/ ?[employer] on Aging: http://kim-miller.com/ ?Contact a health care provider if: ?You fall at home. ?You are afraid of falling at home. ?You feel weak, drowsy, or dizzy. ?Summary ?Serious injuries from a fall happen most often to people older than age 63. Children and young adults ages 62-29 are also at higher risk. ?Talk with your health care provider about your risks for falling and how to lower those risks. ?Taking certain precautions at home can lower your risk for falling. ?If you fall, always tell your health care provider. ?This information is not intended to replace advice given to you by your health care provider. Make sure you discuss any questions you have with your health care provider. ?Document Revised: 10/31/2019 Document Reviewed: 10/31/2019 ?Elsevier Patient Education ? [place]. ?  ? ?

## 2021-08-17 NOTE — Chronic Care Management (AMB) (Signed)
Chronic Care Management   CCM RN Visit Note  08/17/2021 Name: Whitney [name] MRN: [id number] DOB: 05/28/1934  Subjective: Whitney [name] is a 87 y.o. year old female who is a primary care patient of [name], MD. The care management team was consulted for assistance with disease management and care coordination needs.    Engaged with patient by telephone for  acute/ unscheduled incoming call from patient  in response to provider referral for case management and/or care coordination services.   Consent to Services:  The patient was given information about Chronic Care Management services, agreed to services, and gave verbal consent prior to initiation of services.  Please see initial visit note for detailed documentation.  Patient agreed to services and verbal consent obtained.   Assessment: Review of patient past medical history, allergies, medications, health status, including review of consultants reports, laboratory and other test data, was performed as part of comprehensive evaluation and provision of chronic care management services.  CCM Care Plan  No Known Allergies  Outpatient Encounter Medications as of 08/17/2021  Medication Sig Note   Ascorbic Acid (VITAMIN C) 100 MG tablet Take 100 mg by mouth daily.    aspirin 81 MG tablet Take 1 tablet (81 mg total) by mouth daily.    blood glucose meter kit and supplies KIT Use to test blood sugar up to 3 times daily. DX E11.9    Calcium Carb-Cholecalciferol (CALCIUM-VITAMIN D) 500-200 MG-UNIT tablet Take 1 tablet by mouth 2 (two) times daily with a meal.    diclofenac Sodium (VOLTAREN) 1 % GEL Apply 4 g topically 4 (four) times daily.    dorzolamide-timolol (COSOPT) 22.3-6.8 MG/ML ophthalmic solution Instill 1 drop in each eye twice a day    DUREZOL 0.05 % EMUL  03/17/2015: Received from: External Pharmacy   empagliflozin (JARDIANCE) 25 MG TABS tablet Take 1 tablet (25 mg total) by mouth daily before breakfast.     glimepiride (AMARYL) 4 MG tablet TAKE 1 TABLET EVERY DAY BEFORE BREAKFAST    Lancet Devices (ACCU-CHEK SOFTCLIX) lancets Use to test blood sugar up to 3 times a day. DX E11.9    lisinopril-hydrochlorothiazide (ZESTORETIC) 20-12.5 MG tablet Take 1 tablet by mouth daily.    metFORMIN (GLUCOPHAGE) 1000 MG tablet Take 1 tablet (1,000 mg total) by mouth 2 (two) times daily with a meal.    Multiple Vitamin (MULTI VITAMIN DAILY PO) Take one by mouth daily    ONETOUCH VERIO test strip Use to test blood sugar 3 times a day    simvastatin (ZOCOR) 20 MG tablet Take 1 tablet (20 mg total) by mouth at bedtime.    zinc gluconate 50 MG tablet Take 50 mg by mouth daily.    No facility-administered encounter medications on file as of 08/17/2021.   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 associated with type 2 diabetes mellitus ([place]) 08/13/2019   Moderate nonproliferative diabetic retinopathy of right eye with macular edema ([place]) 08/13/2019   Type 2 macular telangiectasis, left 08/13/2019   Retinal telangiectasia of right eye 08/13/2019   OSA on CPAP 07/20/2017   Venous insufficiency 04/26/2017   Sensorineural hearing loss (SNHL), bilateral 12/08/2016   Routine general medical examination at a health care facility 05/25/2016   Type 2 diabetes mellitus with hyperlipidemia ([place]) 02/23/2012   GLAUCOMA 08/24/2009   Left  shoulder pain 02/19/2008   Hyperlipidemia associated with type 2 diabetes mellitus ([place]) 12/22/2006   Essential hypertension 12/22/2006   GERD 12/22/2006   DISC DISEASE, CERVICAL 12/22/2006   Osteopenia 12/22/2006   Conditions to be addressed/monitored:  HLD and DMII  Care Plan : RN Care Manager Plan of Care  Updates made by Knox Royalty, RN since 08/17/2021 12:00 AM     Problem: Chronic Disease Management Needs   Priority: High      Long-Range Goal: Ongoing adherence to established plan of care for long term chronic disease management   Start Date: 02/25/2021  Expected End Date: 02/25/2022  Priority: High  Note:   Current Barriers:  Chronic Disease Management support and education needs related to HLD and DMII Hard of hearing- lost hearing aid for (L) ear- verbalizes plans to replace in coming months; 06/15/21 and 07/24/21-- reports has not yet replaced; "still looking into it" Retinal issues- sees specialist for eye injections; occasional issues with reading; able to drive short distances; family assists with driving long distances    RNCM Clinical Goal(s):  Patient will demonstrate Ongoing health management independence DMII; HLD  through collaboration with RN Care manager, provider, and care team.   Interventions: 1:1 collaboration with primary care provider regarding development and update of comprehensive plan of care as evidenced by provider attestation and co-signature Inter-disciplinary care team collaboration (see longitudinal plan of care) Evaluation of current treatment plan related to  self management and patient's adherence to plan as established by provider 03/19/21- Initial assessment completed Review of patient status, including review of consultants reports, relevant laboratory and other test results, and medications completed 08/17/21: Acute incoming call from patient with voice mail requesting call-back: returned patient's call per her voice mail request Brisha reports today that several days ago, after her recent office visit with PCP on 08/03/21, post- recent mechanical fall at home on 07/28/21, she noticed a "quarter-sized" knot on her (R) leg, near her ankle- she is asking if she should "do anything" about this knot; states she is certain it is related to her fall on 07/28/21; she tells me she wants to "make sure it is not a blood clot" Denies pain, states "it's a little sore;" denies numbness, pain,  decreased sensation; redness at knot site; states it has not increased or decreased in size since she noticed it; has been using prescribed voltaren on the area; able to walk/ bear weight without using assistive devices Reviewed with patient signs/ symptoms acute DVT along with corresponding action plan Advised patient to contact PCP promptly should any signs/ symptoms of DVT occur or should the area get larger or worsen in any way to cause her to have increased concern Noted from review of EHR that patient also called office to report this concern-- I assured her that if Dr. Sharlet Salina thinks she should be seen in office for evaluation of knot, she will have office staff call to schedule appointment Discussed and provided education around fall prevention at home: daughter has gotten her a Secondary school teacher which she is now using- will provide additional educational material via AVS Clarified after our last outreach on 07/24/21 that I received follow up from PCP that her A1-C goal is 8.0: patient very surprised but is relieved as she continued today to verbalize concern that her A1-C had increased to 7.9 (07/21/21)-- reports no significant changes in blood sugars at home  From last outreach 07/24/21: SDOH updated: no new/ unmet concerns identified Depression screening updated: no concerns identified  Pain assessment updated: continues to deny pain; occasional "aches and pains" well managed at home using OTC medications Falls assessment updated: she continues to deny new/ recent falls x 12 months- currently not using assistive devices- has cane for use as/ if indicated/ needed;  positive reinforcement provided with encouragement to continue efforts at fall prevention; previously provided education around fall risks/ prevention reinforced Medications discussed:  reports continues to independently self-manage and denies current concerns/ issues/ questions around medications; endorses adherence to taking all medications as  prescribed Reviewed upcoming scheduled provider appointments: 07/28/21 and 08/11/21- retinal eye specialist; 09/11/21- [facility] outreach; 01/20/22- PCP; patient confirms is aware of all and has plans to attend as scheduled Reviewed recent provider office visit to neurology provider 07/02/21 for CPAP management: she denies post-office visit questions, confirms she continues using CPAP as directed Reviewed recent PCP office visit 07/21/21; reviewed lab value results; patient denies questions post-office visit Discussed plans with patient for ongoing care management follow up and provided patient with direct contact information for care management team     Hyperlipidemia/ HTN Interventions:  GOAL STATUS: 07/24/21: Goal on track; Long Term goal Reviewed importance of limiting foods high in cholesterol Reviewed exercise goals and target of 150 minutes per week Confirmed uses CPAP unit at night as prescribed; no issues reported Confirmed patient continues to get regular exercise: follows an exercise program on TV through [facility]: continues to reports tries to exercise 5 times per week; positive reinforcement provided with encouragement to continue efforts; she again tells me that she is hoping to attend in-person exercise program for seniors at [facility] "soon" Confirmed patient continues monitoring/ recording blood pressures at home: reports blood pressures at home ranging between 126-138/62-74  Diabetes Interventions: GOAL STATUS: 07/24/21: Goal on track; Long Term Goal Assessed patient's understanding of A1c goal: <7% Provided education to patient about basic DM disease process Counseled on importance of regular laboratory monitoring as prescribed Confirmed patient continues to monitor/ record blood sugars at home BID-TID: reports consistent fasting blood sugar values at home between 129-166: she does not have post-prandial values to review today  Reinforced previously provided education around  general goals for blood sugar values at home: between 120-130 fasting; between 160-170 post-prandial Assessed patient's understanding of meaning/ significance of A1-C values: ongoing good baseline understanding of same, however, patient will benefit from ongoing reinforcement/ education/ support- Reviewed individual historical A1-C trends and provided education around correlation of A1-C value to blood sugar levels at home over 3 months: her most recent A1-C value is right in-line with previous values/ results Confirmed patient continues to try to follow heart healthy, low sodium, carbohydrate modified, low sugar diet- positive reinforcement provided with encouragement to continue efforts; reinforced previously provided education around concept of portion control and dietary choices- positive reinforcement provided with encouragement to continue efforts- will provide additional printed educational material to mail to patient  Lab Results  Component Value Date   HGBA1C 7.9 (H) 07/21/2021  Patient Goals/Self-Care Activities: As evidenced by review of EHR, collaboration with care team, and patient reporting during CCM RN CM outreach,  Patient Ronnette will: Take medications as prescribed Attend all scheduled provider appointments Call pharmacy for medication refills Call provider office for new concerns or questions Continue to check fasting (first thing in the morning, before eating) blood sugars at home: your goal for fasting blood sugars are between 130-140 Continue to occasionally check after-eating blood sugars 2 hours after a meal: your goal for after eating blood sugars are  between 160-180 As we discussed today: Dr. Sharlet Salina clarified that your A1-C goal is 8.0 Continue to follow heart healthy, low salt, low cholesterol, carbohydrate-modified, low sugar diet: keep up the good work trying to make sure you control your portions Continue staying as active as possible Please read over the attached  information about preventing falls at home- continue to use your cane as needed and take extra precautions to prevent falls at home  Follow Up Plan:   Telephone follow up appointment with care management team member scheduled for:  Wednesday October 21, 2021 at 11:30 am The patient has been provided with contact information for the care management team and has been advised to call with any health related questions or concerns    Plan: The patient has been provided with contact information for the care management team and has been advised to call with any health related questions or concerns  [name], RN, BSN, [place] [phone]: direct office

## 2021-08-17 NOTE — Telephone Encounter (Signed)
Okay to schedule visit ?

## 2021-08-27 ENCOUNTER — Telehealth: Payer: Self-pay | Admitting: Internal Medicine

## 2021-08-27 NOTE — Telephone Encounter (Signed)
Left message for patient to call back to schedule Medicare Annual Wellness Visit   Last AWV  03/17/20  Please schedule at anytime with [facility]-Nurse Health Advisor if patient calls the office back.      Any questions, please call me at [phone] 

## 2021-08-28 ENCOUNTER — Ambulatory Visit: Payer: HMO | Admitting: *Deleted

## 2021-08-28 DIAGNOSIS — E1169 Type 2 diabetes mellitus with other specified complication: Secondary | ICD-10-CM

## 2021-08-28 NOTE — Chronic Care Management (AMB) (Signed)
Chronic Care Management   CCM RN Visit Note  08/28/2021 Name: Whitney [name] MRN: [id number] DOB: August 04, 1934  Subjective: Whitney [name] is a 87 y.o. year old female who is a primary care patient of [name], MD. The care management team was consulted for assistance with disease management and care coordination needs.    Engaged with patient by telephone for  acute/ unscheduled outreach  in response to provider referral for case management and/or care coordination services.   Consent to Services:  The patient was given information about Chronic Care Management services, agreed to services, and gave verbal consent prior to initiation of services.  Please see initial visit note for detailed documentation.  Patient agreed to services and verbal consent obtained.   Assessment: Review of patient past medical history, allergies, medications, health status, including review of consultants reports, laboratory and other test data, was performed as part of comprehensive evaluation and provision of chronic care management services.  CCM Care Plan  No Known Allergies  Outpatient Encounter Medications as of 08/28/2021  Medication Sig Note   Ascorbic Acid (VITAMIN C) 100 MG tablet Take 100 mg by mouth daily.    aspirin 81 MG tablet Take 1 tablet (81 mg total) by mouth daily.    blood glucose meter kit and supplies KIT Use to test blood sugar up to 3 times daily. DX E11.9    Calcium Carb-Cholecalciferol (CALCIUM-VITAMIN D) 500-200 MG-UNIT tablet Take 1 tablet by mouth 2 (two) times daily with a meal.    diclofenac Sodium (VOLTAREN) 1 % GEL Apply 4 g topically 4 (four) times daily.    dorzolamide-timolol (COSOPT) 22.3-6.8 MG/ML ophthalmic solution Instill 1 drop in each eye twice a day    DUREZOL 0.05 % EMUL  03/17/2015: Received from: External Pharmacy   empagliflozin (JARDIANCE) 25 MG TABS tablet Take 1 tablet (25 mg total) by mouth daily before breakfast.    glimepiride (AMARYL) 4 MG  tablet TAKE 1 TABLET EVERY DAY BEFORE BREAKFAST    Lancet Devices (ACCU-CHEK SOFTCLIX) lancets Use to test blood sugar up to 3 times a day. DX E11.9    lisinopril-hydrochlorothiazide (ZESTORETIC) 20-12.5 MG tablet Take 1 tablet by mouth daily.    metFORMIN (GLUCOPHAGE) 1000 MG tablet Take 1 tablet (1,000 mg total) by mouth 2 (two) times daily with a meal.    Multiple Vitamin (MULTI VITAMIN DAILY PO) Take one by mouth daily    ONETOUCH VERIO test strip Use to test blood sugar 3 times a day    simvastatin (ZOCOR) 20 MG tablet Take 1 tablet (20 mg total) by mouth at bedtime.    zinc gluconate 50 MG tablet Take 50 mg by mouth daily.    No facility-administered encounter medications on file as of 08/28/2021.   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 associated with type 2 diabetes mellitus ([place]) 08/13/2019   Moderate nonproliferative diabetic retinopathy of right eye with macular edema ([place]) 08/13/2019   Type 2 macular telangiectasis, left 08/13/2019   Retinal telangiectasia of right eye 08/13/2019   OSA on CPAP 07/20/2017   Venous insufficiency 04/26/2017   Sensorineural hearing loss (SNHL), bilateral 12/08/2016   Routine general medical examination at a health care facility 05/25/2016   Type 2 diabetes mellitus with hyperlipidemia ([place]) 02/23/2012   GLAUCOMA 08/24/2009   Left shoulder pain 02/19/2008  Hyperlipidemia associated with type 2 diabetes mellitus ([place]) 12/22/2006   Essential hypertension 12/22/2006   GERD 12/22/2006   DISC DISEASE, CERVICAL 12/22/2006   Osteopenia 12/22/2006   Conditions to be addressed/monitored:  HLD and DMII  Care Plan : RN Care Manager Plan of Care  Updates made by Knox Royalty, RN since 08/28/2021 12:00 AM     Problem: Chronic Disease Management Needs   Priority: High     Long-Range Goal:  Ongoing adherence to established plan of care for long term chronic disease management   Start Date: 02/25/2021  Expected End Date: 02/25/2022  Priority: High  Note:   Current Barriers:  Chronic Disease Management support and education needs related to HLD and DMII Hard of hearing- lost hearing aid for (L) ear- verbalizes plans to replace in coming months; 06/15/21 and 07/24/21-- reports has not yet replaced; "still looking into it" Retinal issues- sees specialist for eye injections; occasional issues with reading; able to drive short distances; family assists with driving long distances    RNCM Clinical Goal(s):  Patient will demonstrate Ongoing health management independence DMII; HLD  through collaboration with RN Care manager, provider, and care team.   Interventions: 1:1 collaboration with primary care provider regarding development and update of comprehensive plan of care as evidenced by provider attestation and co-signature Inter-disciplinary care team collaboration (see longitudinal plan of care) Evaluation of current treatment plan related to  self management and patient's adherence to plan as established by provider 03/19/21- Initial assessment completed Review of patient status, including review of consultants reports, relevant laboratory and other test results, and medications completed 08/28/21: Acute incoming call with voice message from patient, requesting call-back; returned patient's call as requested Today, Whitney [name] reports that she received a letter from her mail-order pharmacy telling her that there has been a recall of her eye drops for glaucoma: Dorzolamide-timolol (COSOPT); she tells me the letter indicates that the recall is due to "the company is in bankruptcy;" she is asking if she should continue taking this medication BID, as she has been, "for a long time;" she denies any personal issues/ concerns around use of this eye drop; states that she has "never had any problems  from taking it;" states it was prescribed by Dr. Katy Fitch, "a long time ago;" and that Dr. Zadie Rhine, her retina specialist, told her she "would be on the drops for the rest of my life" Advised patient that I would make [facility] Pharmacy team aware of her question- she did not have contact information for pharmacy team; I advised patient that she has telephone call appointment with CCM Pharmacist on Friday September 11, 2021 at 1:00 pm and advised her to discuss with [facility] Pharmacy then-- she verbalizes agreement and understanding For now-- advised patient to continue using eye drops as she has been, since she is not experiencing any untoward side effects or problems from it: she confirms she "has plenty" of the eye drops at home Noted patient has scheduled visit with retinal provider on September 22, 2021 Will make [facility] aware of call/ question patient verbalizes today, so that this may be addressed with patient at time of scheduled call September 15, 2021 OR- prior, if CCM Pharmacist wishes to outreach patient sooner, based on care coordination outreach- patient verbalizes agreement with this plan and denies additional issues today  From last outreach 08/17/21: 08/17/21: Acute incoming call from patient with voice mail requesting call-back: returned patient's call per her voice mail request Whitney [name] reports today that several  days ago, after her recent office visit with PCP on 08/03/21, post- recent mechanical fall at home on 07/28/21, she noticed a "quarter-sized" knot on her (R) leg, near her ankle- she is asking if she should "do anything" about this knot; states she is certain it is related to her fall on 07/28/21; she tells me she wants to "make sure it is not a blood clot" Denies pain, states "it's a little sore;" denies numbness, pain, decreased sensation; redness at knot site; states it has not increased or decreased in size since she noticed it; has been using prescribed voltaren on the area; able to walk/ bear weight without  using assistive devices Reviewed with patient signs/ symptoms acute DVT along with corresponding action plan Advised patient to contact PCP promptly should any signs/ symptoms of DVT occur or should the area get larger or worsen in any way to cause her to have increased concern Noted from review of EHR that patient also called office to report this concern-- I assured her that if Dr. Sharlet Salina thinks she should be seen in office for evaluation of knot, she will have office staff call to schedule appointment Discussed and provided education around fall prevention at home: daughter has gotten her a reacher which she is now using- will provide additional educational material via AVS Clarified after our last outreach on 07/24/21 that I received follow up from PCP that her A1-C goal is 8.0: patient very surprised but is relieved as she continued today to verbalize concern that her A1-C had increased to 7.9 (07/21/21)-- reports no significant changes in blood sugars at home  From last outreach 07/24/21: SDOH updated: no new/ unmet concerns identified Depression screening updated: no concerns identified Pain assessment updated: continues to deny pain; occasional "aches and pains" well managed at home using OTC medications Falls assessment updated: she continues to deny new/ recent falls x 12 months- currently not using assistive devices- has cane for use as/ if indicated/ needed;  positive reinforcement provided with encouragement to continue efforts at fall prevention; previously provided education around fall risks/ prevention reinforced Medications discussed:  reports continues to independently self-manage and denies current concerns/ issues/ questions around medications; endorses adherence to taking all medications as prescribed Reviewed upcoming scheduled provider appointments: 07/28/21 and 08/11/21- retinal eye specialist; 09/11/21- [place] outreach; 01/20/22- PCP; patient confirms is aware of all and has  plans to attend as scheduled Reviewed recent provider office visit to neurology provider 07/02/21 for CPAP management: she denies post-office visit questions, confirms she continues using CPAP as directed Reviewed recent PCP office visit 07/21/21; reviewed lab value results; patient denies questions post-office visit Discussed plans with patient for ongoing care management follow up and provided patient with direct contact information for care management team     Hyperlipidemia/ HTN Interventions:  GOAL STATUS: 07/24/21: Goal on track; Long Term goal Reviewed importance of limiting foods high in cholesterol Reviewed exercise goals and target of 150 minutes per week Confirmed uses CPAP unit at night as prescribed; no issues reported Confirmed patient continues to get regular exercise: follows an exercise program on TV through [facility]: continues to reports tries to exercise 5 times per week; positive reinforcement provided with encouragement to continue efforts; she again tells me that she is hoping to attend in-person exercise program for seniors at [facility] "soon" Confirmed patient continues monitoring/ recording blood pressures at home: reports blood pressures at home ranging between 126-138/62-74  Diabetes Interventions: GOAL STATUS: 07/24/21: Goal on track; Long Term  Goal Assessed patient's understanding of A1c goal: <7% Provided education to patient about basic DM disease process Counseled on importance of regular laboratory monitoring as prescribed Confirmed patient continues to monitor/ record blood sugars at home BID-TID: reports consistent fasting blood sugar values at home between 129-166: she does not have post-prandial values to review today  Reinforced previously provided education around general goals for blood sugar values at home: between 120-130 fasting; between 160-170 post-prandial Assessed patient's understanding of meaning/ significance of A1-C values: ongoing good baseline  understanding of same, however, patient will benefit from ongoing reinforcement/ education/ support- Reviewed individual historical A1-C trends and provided education around correlation of A1-C value to blood sugar levels at home over 3 months: her most recent A1-C value is right in-line with previous values/ results Confirmed patient continues to try to follow heart healthy, low sodium, carbohydrate modified, low sugar diet- positive reinforcement provided with encouragement to continue efforts; reinforced previously provided education around concept of portion control and dietary choices- positive reinforcement provided with encouragement to continue efforts- will provide additional printed educational material to mail to patient  Lab Results  Component Value Date   HGBA1C 7.9 (H) 07/21/2021  Patient Goals/Self-Care Activities: As evidenced by review of EHR, collaboration with care team, and patient reporting during CCM RN CM outreach,  Patient Whitney [name] will: Take medications as prescribed Attend all scheduled provider appointments Call pharmacy for medication refills Call provider office for new concerns or questions Continue to check fasting (first thing in the morning, before eating) blood sugars at home: your goal for fasting blood sugars are between 130-140 Continue to occasionally check after-eating blood sugars 2 hours after a meal: your goal for after eating blood sugars are between 160-180 As we discussed today: Dr. Sharlet Salina clarified that your A1-C goal is 8.0 Continue to follow heart healthy, low salt, low cholesterol, carbohydrate-modified, low sugar diet: keep up the good work trying to make sure you control your portions Continue staying as active as possible Please read over the attached information about preventing falls at home- continue to use your cane as needed and take extra precautions to prevent falls at home  Follow Up Plan:   Telephone follow up appointment with care  management team member scheduled for:  Wednesday October 21, 2021 at 11:30 am The patient has been provided with contact information for the care management team and has been advised to call with any health related questions or concerns     Plan: The patient has been provided with contact information for the care management team and has been advised to call with any health related questions or concerns  [name], RN, BSN, [place] [phone]: direct office

## 2021-09-09 DIAGNOSIS — E785 Hyperlipidemia, unspecified: Secondary | ICD-10-CM

## 2021-09-09 DIAGNOSIS — E1169 Type 2 diabetes mellitus with other specified complication: Secondary | ICD-10-CM | POA: Diagnosis not present

## 2021-09-09 DIAGNOSIS — I10 Essential (primary) hypertension: Secondary | ICD-10-CM

## 2021-09-09 DIAGNOSIS — Z7984 Long term (current) use of oral hypoglycemic drugs: Secondary | ICD-10-CM | POA: Diagnosis not present

## 2021-09-11 ENCOUNTER — Telehealth: Payer: HMO

## 2021-09-16 ENCOUNTER — Other Ambulatory Visit: Payer: Self-pay | Admitting: Internal Medicine

## 2021-09-16 ENCOUNTER — Ambulatory Visit (INDEPENDENT_AMBULATORY_CARE_PROVIDER_SITE_OTHER): Payer: HMO

## 2021-09-16 DIAGNOSIS — I10 Essential (primary) hypertension: Secondary | ICD-10-CM

## 2021-09-16 DIAGNOSIS — E1169 Type 2 diabetes mellitus with other specified complication: Secondary | ICD-10-CM

## 2021-09-16 NOTE — Progress Notes (Signed)
Chronic Care Management Pharmacy Note  09/16/2021 Name:  Whitney [name] MRN:  [id number] DOB:  July 08, 1934  Summary: - Patient reports that blood sugars have been at goalrecently, averaging 110-150's in the AM , reports that BG typically lower in PM when she checks -BP has been borderline - BP averaging 120-140's/60-80's -Patient reports compliance to current medications, denies any issues or side effects from current medications  -Reports that she received a letter from her pharmacy regarding her cosopt eye drops - possible lot effected in recall - pt has not reached out to her pharmacy to further discuss if eye drops she received were included in recall  Recommendations/Changes made from today's visit: -Recommending for patient to continue with dietary changes to help better control BP and BG - patient to continue monitoring as she is - will reach out should BP >140/90 or BG >150 -Patient scheduled for PCP follow up as she is overdue - would recommend updated A1c and CMP at that time  -Advised for patient to reach out to her mail order pharmacy to discuss if her cosopt eye drops she received have been effected by the recall - pt to also reach out to Dr. Radene Ou office to discuss if medication should be substituted if no longer able to use   Subjective: Whitney [name] is an 87 y.o. year old female who is a primary patient of [name], MD.  The CCM team was consulted for assistance with disease management and care coordination needs.    Engaged with patient by telephone for follow up visit in response to provider referral for pharmacy case management and/or care coordination services.   Consent to Services:  The patient was given information about Chronic Care Management services, agreed to services, and gave verbal consent prior to initiation of services.  Please see initial visit note for detailed documentation.   Patient Care Team: [name], MD as PCP -  General (Internal Medicine) Noralee Space, MD as Consulting Physician (Pulmonary Disease) Tuchman, Leslye Peer, DPM (Inactive) as Consulting Physician (Podiatry) Knox Royalty, RN as Case Manager Daneli Butkiewicz, Darnelle Maffucci, [facility] (Pharmacist)  Pt has lived here her whole life, she has 4 children, 4 grandchildren, 3 great-grandchildren. Patient lives alone; she takes care of house work.  Recent office visits: 08/03/2021 - Dr. Sharlet Salina - ER follow up - rib fracture - continue using APAP prn pain  07/21/2021 - Dr. Sharlet Salina - memory change - referred to neuropsych for testing to assess degree of changes   Recent consult visits: 08/11/2021 - Dr. Zadie Rhine - Ophthalmology - bevacizumab injection  - f/u in 8 weeks  07/28/2021 - Dr. Zadie Rhine - Ophthalmology - eylea injection given - f/u in 8 weeks  07/02/2021 - Ward Givens NP - Neurology - continue CPAP nightly >4 hours a night - f/u in 1 year  06/16/2021 - Dr. Marissa Nestle - Ophthalmology - bevacizumab injection f/u in 8 weeks   Hospital visits: 07/28/2021 - ED visit - fall / fracture of one rib on left side - rx'd norco 5-325 on discharge    Objective:  Lab Results  Component Value Date   CREATININE 0.79 07/28/2021   BUN 14 07/28/2021   GFR 69.73 07/21/2021   GFRNONAA >60 07/28/2021   GFRAA 156 02/19/2008   NA 141 07/28/2021   K 3.9 07/28/2021   CALCIUM 11.0 (H) 07/28/2021   CO2 30 07/28/2021   GLUCOSE 139 (H) 07/28/2021    Lab Results  Component Value Date/Time  HGBA1C 7.9 (H) 07/21/2021 10:02 AM   HGBA1C 7.7 (A) 10/22/2020 10:29 AM   HGBA1C 7.9 (H) 03/17/2020 09:50 AM   GFR 69.73 07/21/2021 10:02 AM   GFR 67.28 03/17/2020 09:50 AM   MICROALBUR 2.0 (H) 05/19/2015 09:21 AM   MICROALBUR 2.5 (H) 05/14/2014 10:01 AM    Last diabetic Eye exam:  Lab Results  Component Value Date/Time   HMDIABEYEEXA Retinopathy (A) 01/07/2021 12:00 AM    Last diabetic Foot exam: No results found for: HMDIABFOOTEX   Lab Results  Component Value Date   CHOL 128  07/21/2021   HDL 41.70 07/21/2021   LDLCALC 67 07/21/2021   TRIG 97.0 07/21/2021   CHOLHDL 3 07/21/2021       Latest Ref Rng & Units 07/28/2021    5:39 PM 07/21/2021   10:02 AM 03/17/2020    9:50 AM  Hepatic Function  Total Protein 6.5 - 8.1 g/dL 8.1   7.7   8.0    Albumin 3.5 - 5.0 g/dL 4.3   4.2   4.1    AST 15 - 41 U/L 21   20   21    ALT 0 - 44 U/L 15   15   18    Alk Phosphatase 38 - 126 U/L 40   43   45    Total Bilirubin 0.3 - 1.2 mg/dL 1.5   1.0   1.0      Lab Results  Component Value Date/Time   TSH 1.13 09/14/2013 09:59 AM   TSH 1.22 08/23/2012 10:19 AM       Latest Ref Rng & Units 07/28/2021    5:39 PM 07/21/2021   10:02 AM 03/17/2020    9:50 AM  CBC  WBC 4.0 - 10.5 K/uL 8.6   5.6   5.4    Hemoglobin 12.0 - 15.0 g/dL 13.5   13.1   12.9    Hematocrit 36.0 - 46.0 % 41.9   40.3   39.7    Platelets 150 - 400 K/uL 208   195.0   222.0      Lab Results  Component Value Date/Time   VD25OH 66 08/23/2011 10:26 AM   VD25OH 40 08/18/2009 07:43 PM    Clinical ASCVD: No  The ASCVD Risk score (Arnett DK, et al., 2019) failed to calculate for the following reasons:   The 2019 ASCVD risk score is only valid for ages 40 to 79       07/24/2021   11:30 AM 06/15/2021   11:40 AM 01/09/2021    9:22 AM  Depression screen PHQ 2/9  Decreased Interest 0 0 0  Down, Depressed, Hopeless 0 0 0  PHQ - 2 Score 0 0 0     Social History   Tobacco Use  Smoking Status Never  Smokeless Tobacco Never   BP Readings from Last 3 Encounters:  08/03/21 126/80  07/28/21 (!) 152/80  07/21/21 122/66   Pulse Readings from Last 3 Encounters:  08/03/21 62  07/28/21 60  07/21/21 61   Wt Readings from Last 3 Encounters:  08/03/21 144 lb 6.4 oz (65.5 kg)  07/21/21 147 lb 12.8 oz (67 kg)  07/02/21 147 lb 9.6 oz (67 kg)   BMI Readings from Last 3 Encounters:  08/03/21 27.28 kg/m  07/21/21 27.93 kg/m  07/02/21 27.89 kg/m    Assessment/Interventions: Review of patient past medical  history, allergies, medications, health status, including review of consultants reports, laboratory and other test data, was performed as part   of comprehensive evaluation and provision of chronic care management services.   SDOH:  (Social Determinants of Health) assessments and interventions performed: Yes  SDOH Screenings   Alcohol Screen: Not on file  Depression (PHQ2-9): Low Risk    PHQ-2 Score: 0  Financial Resource Strain: Not on file  Food Insecurity: No Food Insecurity   Worried About Running Out of Food in the Last Year: Never true   Ran Out of Food in the Last Year: Never true  Housing: Low Risk    Last Housing Risk Score: 0  Physical Activity: Not on file  Social Connections: Not on file  Stress: Not on file  Tobacco Use: Low Risk    Smoking Tobacco Use: Never   Smokeless Tobacco Use: Never   Passive Exposure: Not on file  Transportation Needs: No Transportation Needs   Lack of Transportation (Medical): No   Lack of Transportation (Non-Medical): No    CCM Care Plan  No Known Allergies  Medications Reviewed Today     Reviewed by ,  C, RPH (Pharmacist) on 09/16/21 at 1152  Med List Status: <None>   Medication Order Taking? Sig Documenting Provider Last Dose Status Informant  Ascorbic Acid (VITAMIN C) 100 MG tablet [id number] Yes Take 100 mg by mouth daily. [provider] Taking Active   aspirin 81 MG tablet [id number] Yes Take 1 tablet (81 mg total) by mouth daily. Crawford, Elizabeth A, MD Taking Active   blood glucose meter kit and supplies KIT [id number] Yes Use to test blood sugar up to 3 times daily. DX E11.9 Crawford, Elizabeth A, MD Taking Active   Calcium Carb-Cholecalciferol (CALCIUM-VITAMIN D) 500-200 MG-UNIT tablet [id number] Yes Take 1 tablet by mouth 2 (two) times daily with a meal. Crawford, Elizabeth A, MD Taking Active   diclofenac Sodium (VOLTAREN) 1 % GEL [id number] Yes Apply 4 g topically 4 (four) times daily. Crawford, Elizabeth A,  MD Taking Active   dorzolamide-timolol (COSOPT) 22.3-6.8 MG/ML ophthalmic solution [id number] Yes Instill 1 drop in each eye twice a day Crawford, Elizabeth A, MD Taking Active   DUREZOL 0.05 % EMUL [id number] No   Patient not taking: Reported on 09/16/2021   [provider] Not Taking Active            Med Note (ROBERSON, AMY R   Mon Mar 17, 2015  9:44 AM) Received from: External Pharmacy  empagliflozin (JARDIANCE) 25 MG TABS tablet [id number] Yes Take 1 tablet (25 mg total) by mouth daily before breakfast. Crawford, Elizabeth A, MD Taking Active   glimepiride (AMARYL) 4 MG tablet [id number] Yes TAKE 1 TABLET EVERY DAY BEFORE BREAKFAST Crawford, Elizabeth A, MD Taking Active   Lancet Devices (ACCU-CHEK SOFTCLIX) lancets [id number]  Use to test blood sugar up to 3 times a day. DX E11.9 Crawford, Elizabeth A, MD  Active   lisinopril-hydrochlorothiazide (ZESTORETIC) 20-12.5 MG tablet [id number] Yes Take 1 tablet by mouth daily. Crawford, Elizabeth A, MD Taking Active   metFORMIN (GLUCOPHAGE) 1000 MG tablet [id number] Yes Take 1 tablet (1,000 mg total) by mouth 2 (two) times daily with a meal. Crawford, Elizabeth A, MD Taking Active   Multiple Vitamin (MULTI VITAMIN DAILY PO) [id number] Yes Take one by mouth daily [provider] Taking Active            Med Note (,  C   Fri Jun 12, 2021  2:41 PM)    ONETOUCH VERIO test strip [id number]  Use to test blood sugar 3 times a day Crawford, Elizabeth   A, MD  Active   simvastatin (ZOCOR) 20 MG tablet [id number] Yes Take 1 tablet (20 mg total) by mouth at bedtime. Crawford, Elizabeth A, MD Taking Active   zinc gluconate 50 MG tablet [id number] Yes Take 50 mg by mouth daily. [provider] Taking Active             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 associated with type 2  diabetes mellitus (HCC) 08/13/2019   Moderate nonproliferative diabetic retinopathy of right eye with macular edema (HCC) 08/13/2019   Type 2 macular telangiectasis, left 08/13/2019   Retinal telangiectasia of right eye 08/13/2019   OSA on CPAP 07/20/2017   Venous insufficiency 04/26/2017   Sensorineural hearing loss (SNHL), bilateral 12/08/2016   Routine general medical examination at a health care facility 05/25/2016   Type 2 diabetes mellitus with hyperlipidemia (HCC) 02/23/2012   GLAUCOMA 08/24/2009   Left shoulder pain 02/19/2008   Hyperlipidemia associated with type 2 diabetes mellitus (HCC) 12/22/2006   Essential hypertension 12/22/2006   GERD 12/22/2006   DISC DISEASE, CERVICAL 12/22/2006   Osteopenia 12/22/2006    Immunization History  Administered Date(s) Administered   Fluad Quad(high Dose 65+) 03/30/2019, 03/17/2020   Influenza Split 02/18/2011, 02/23/2012   Influenza Whole 01/19/2005, 02/17/2009, 02/16/2010   Influenza, High Dose Seasonal PF 02/14/2015, 04/21/2017, 02/02/2018   Influenza,inj,Quad PF,6+ Mos 03/12/2013, 02/07/2014   PFIZER(Purple Top)SARS-COV-2 Vaccination 05/04/2019, 05/28/2019, 01/29/2020   Pfizer Covid-19 Vaccine Bivalent Booster 12yrs & up 03/03/2021   Pneumococcal Conjugate-13 05/25/2016   Pneumococcal Polysaccharide-23 04/12/1998, 02/16/2010   Td 08/21/2002   Tdap 08/23/2011    Conditions to be addressed/monitored:  Hypertension, Hyperlipidemia, and Diabetes  Care Plan : CCM Pharmacy Care Plan  Updates made by ,  C, RPH since 09/16/2021 12:00 AM     Problem: Hypertension, Hyperlipidemia, and Diabetes   Priority: High     Long-Range Goal: Disease management   Start Date: 10/16/2020  Expected End Date: 09/17/2022  This Visit's Progress: On track  Recent Progress: Not on track  Priority: High  Note:   Current Barriers:  Unable to independently monitor therapeutic efficacy Unable to maintain control of BP, DM  Pharmacist  Clinical Goal(s):  Patient will achieve adherence to monitoring guidelines and medication adherence to achieve therapeutic efficacy adhere to plan to optimize therapeutic regimen for BP, DM as evidenced by report of adherence to recommended medication management changes through collaboration with PharmD and provider.   Interventions: 1:1 collaboration with Crawford, Elizabeth A, MD regarding development and update of comprehensive plan of care as evidenced by provider attestation and co-signature Inter-disciplinary care team collaboration (see longitudinal plan of care) Comprehensive medication review performed; medication list updated in electronic medical record  Hypertension  Stable BP goal is:  <140/90 BP Readings from Last 3 Encounters:  08/03/21 126/80  07/28/21 (!) 152/80  07/21/21 122/66  BP control has improved  Patient checks BP at home daily Patient home BP readings are ranging: 142/67, 144/66, 152/71, 126/62, 134/73, 136/66 Patient has failed these meds in the past: n/a Patient is currently controlled on the following medications: Lisinopril-HCTZ 20-12.5 mg daily AM  Patient denies sodium intake, reports that she does not have any salt in her house, does note to drinking 1.5 cups of coffee in the AM, and checks BP in AM, recommended reducing caffeine intake as well   as checking BP as time later in the day to assess BP control without influence of caffeine - agreeable - will reach out should BP average >140/90   Plan: Continue current medications    Hyperlipidemia   Controlled  LDL goal < 100 Lab Results  Component Value Date   LDLCALC 67 07/21/2021  Patient has failed these meds in past: n/a Patient is currently controlled on the following medications: Simvastatin 20 mg HS Aspirin 81 mg daily   We discussed:  pt reports compliance with medications as above; LDL is at goal; pt endorses daily exercise and low cholesterol diet   Plan: Continue current medications    Diabetes  A1c goal <8%  Lab Results  Component Value Date   HGBA1C 7.9 (H) 07/21/2021  Checking BG: Daily in AM Recent FBG Readings: 130, 142, 144, 116, 140, 145, 124, 131, 136, 112 ,147 Started following with nutrition 01/09/2021 - patient has been making changes to diet and watching portion size Patient has failed these meds in past: pioglitazone Patient is currently controlled on the following medications: Metformin 1000 mg BID Glimepiride 4 mg daily AM (started ~2012) Jardiance 25 mg daily AM Testing supplies -Patient has started following with nutrition, notes that she has improved her portion contorl Plan-Patient to continue current medications - possible in future if more glycemic control is necessary that she would benefit from rybelsus - at this time, no further changes   Patient Goals/Self-Care Activities Patient will:  - take medications as prescribed focus on medication adherence by pill box check glucose daily, document, and provide at future appointments check blood pressure daily, document, and provide at future appointments target a minimum of 150 minutes of moderate intensity exercise weekly engage in dietary modifications by reducing carb/sugar     Medication Assistance: None required.  Patient affirms current coverage meets needs.  Care Gaps: Tdap  Patient's preferred pharmacy is:  [facility], [address] [place] [address] Phone: [phone] Fax: [facility], [address] [place] [address] Phone: [phone] Fax: [phone]   Uses pill box? Yes Pt endorses 100% compliance  Care Plan and Follow Up Patient Decision:  Patient agrees to Care Plan and Follow-up.  Plan: Telephone follow up appointment with care management team member scheduled for:   3 months  Tomasa Blase, PharmD Clinical Pharmacist, [place]

## 2021-09-16 NOTE — Patient Instructions (Signed)
Visit Information  Following are the goals we discussed today:   Track and Manage My Blood Pressure   Timeframe:  Long-Range Goal Priority:  High Start Date:    10/15/20                         Expected End Date:   06/2022                  Follow Up Date 01/2022   - check blood pressure daily - write blood pressure results in a log or diary -Reduce salt in diet -Take blood pressure medication (lisinopril-Hctz) every day    Why is this important?   You won't feel high blood pressure, but it can still hurt your blood vessels.  High blood pressure can cause heart or kidney problems. It can also cause a stroke.  Making lifestyle changes like losing a little weight or eating less salt will help.  Checking your blood pressure at home and at different times of the day can help to control blood pressure.  If the doctor prescribes medicine remember to take it the way the doctor ordered.  Call the office if you cannot afford the medicine or if there are questions about it.   Plan: Telephone follow up appointment with care management team member scheduled for:  4 months The patient has been provided with contact information for the care management team and has been advised to call with any health related questions or concerns.   Tomasa Blase, PharmD Clinical Pharmacist, [facility]   Please call the care guide team at [phone] if you need to cancel or reschedule your appointment.   Patient verbalizes understanding of instructions and care plan provided today and agrees to view in [place]. Active MyChart status and patient understanding of how to access instructions and care plan via MyChart confirmed with patient.

## 2021-09-21 DIAGNOSIS — H401131 Primary open-angle glaucoma, bilateral, mild stage: Secondary | ICD-10-CM | POA: Diagnosis not present

## 2021-09-21 DIAGNOSIS — G8929 Other chronic pain: Secondary | ICD-10-CM | POA: Diagnosis not present

## 2021-09-21 DIAGNOSIS — E1165 Type 2 diabetes mellitus with hyperglycemia: Secondary | ICD-10-CM | POA: Diagnosis not present

## 2021-09-21 DIAGNOSIS — G319 Degenerative disease of nervous system, unspecified: Secondary | ICD-10-CM | POA: Diagnosis not present

## 2021-09-21 DIAGNOSIS — G4733 Obstructive sleep apnea (adult) (pediatric): Secondary | ICD-10-CM | POA: Diagnosis not present

## 2021-09-21 DIAGNOSIS — E785 Hyperlipidemia, unspecified: Secondary | ICD-10-CM | POA: Diagnosis not present

## 2021-09-21 DIAGNOSIS — E1139 Type 2 diabetes mellitus with other diabetic ophthalmic complication: Secondary | ICD-10-CM | POA: Diagnosis not present

## 2021-09-21 DIAGNOSIS — E1142 Type 2 diabetes mellitus with diabetic polyneuropathy: Secondary | ICD-10-CM | POA: Diagnosis not present

## 2021-09-21 DIAGNOSIS — H409 Unspecified glaucoma: Secondary | ICD-10-CM | POA: Diagnosis not present

## 2021-09-21 DIAGNOSIS — E1169 Type 2 diabetes mellitus with other specified complication: Secondary | ICD-10-CM | POA: Diagnosis not present

## 2021-09-21 DIAGNOSIS — H42 Glaucoma in diseases classified elsewhere: Secondary | ICD-10-CM | POA: Diagnosis not present

## 2021-09-21 DIAGNOSIS — E663 Overweight: Secondary | ICD-10-CM | POA: Diagnosis not present

## 2021-09-22 ENCOUNTER — Encounter (INDEPENDENT_AMBULATORY_CARE_PROVIDER_SITE_OTHER): Payer: Self-pay | Admitting: Ophthalmology

## 2021-09-22 ENCOUNTER — Ambulatory Visit (INDEPENDENT_AMBULATORY_CARE_PROVIDER_SITE_OTHER): Payer: HMO | Admitting: Ophthalmology

## 2021-09-22 DIAGNOSIS — Z9989 Dependence on other enabling machines and devices: Secondary | ICD-10-CM | POA: Diagnosis not present

## 2021-09-22 DIAGNOSIS — G4733 Obstructive sleep apnea (adult) (pediatric): Secondary | ICD-10-CM

## 2021-09-22 DIAGNOSIS — E113312 Type 2 diabetes mellitus with moderate nonproliferative diabetic retinopathy with macular edema, left eye: Secondary | ICD-10-CM | POA: Diagnosis not present

## 2021-09-22 MED ORDER — AFLIBERCEPT 2MG/0.05ML IZ SOLN FOR KALEIDOSCOPE
2.0000 mg | INTRAVITREAL | Status: AC | PRN
  Administered 2021-09-22: 2 mg via INTRAVITREAL

## 2021-09-22 NOTE — Assessment & Plan Note (Signed)
Remains compliant on CPAP use

## 2021-09-22 NOTE — Progress Notes (Signed)
09/22/2021     CHIEF COMPLAINT Patient presents for  Chief Complaint  Patient presents with   Diabetic Retinopathy with Macular Edema    Of each eye.  Patient reports ongoing excellent compliance with CPAP use  HISTORY OF PRESENT ILLNESS: Whitney [name] is a 87 y.o. female who presents to the clinic today for:   HPI   8 weeks for DILATE, OS, EYLEA, OCT. Pt stated, "last week, in my left eye, it seem like there was a little skin over it. It was there for 3 days but then it went away." Pt denies new floaters and FOL. Pt is currently taking DORZ-TIMOLOL, pt is showing concerns of eye drops due to recall.  Last edited by Silvestre Moment on 09/22/2021  9:37 AM.      Referring physician: [name], MD [place],  [place] [address]  HISTORICAL INFORMATION:   Selected notes from the MEDICAL RECORD NUMBER    Lab Results  Component Value Date   HGBA1C 7.9 (H) 07/21/2021     CURRENT MEDICATIONS: Current Outpatient Medications (Ophthalmic Drugs)  Medication Sig   dorzolamide-timolol (COSOPT) 22.3-6.8 MG/ML ophthalmic solution Instill 1 drop in each eye twice a day   DUREZOL 0.05 % EMUL  (Patient not taking: Reported on 09/16/2021)   No current facility-administered medications for this visit. (Ophthalmic Drugs)   Current Outpatient Medications (Other)  Medication Sig   Ascorbic Acid (VITAMIN C) 100 MG tablet Take 100 mg by mouth daily.   aspirin 81 MG tablet Take 1 tablet (81 mg total) by mouth daily.   blood glucose meter kit and supplies KIT Use to test blood sugar up to 3 times daily. DX E11.9   Calcium Carb-Cholecalciferol (CALCIUM-VITAMIN D) 500-200 MG-UNIT tablet Take 1 tablet by mouth 2 (two) times daily with a meal.   diclofenac Sodium (VOLTAREN) 1 % GEL Apply 4 g topically 4 (four) times daily.   empagliflozin (JARDIANCE) 25 MG TABS tablet Take 1 tablet (25 mg total) by mouth daily before breakfast.   glimepiride (AMARYL) 4 MG tablet TAKE 1 TABLET  EVERY DAY BEFORE BREAKFAST   Lancet Devices (ACCU-CHEK SOFTCLIX) lancets Use to test blood sugar up to 3 times a day. DX E11.9   lisinopril-hydrochlorothiazide (ZESTORETIC) 20-12.5 MG tablet Take 1 tablet by mouth daily.   metFORMIN (GLUCOPHAGE) 1000 MG tablet Take 1 tablet (1,000 mg total) by mouth 2 (two) times daily with a meal.   Multiple Vitamin (MULTI VITAMIN DAILY PO) Take one by mouth daily   ONETOUCH VERIO test strip Use to test blood sugar 3 times a day   simvastatin (ZOCOR) 20 MG tablet Take 1 tablet (20 mg total) by mouth at bedtime.   zinc gluconate 50 MG tablet Take 50 mg by mouth daily.   No current facility-administered medications for this visit. (Other)      REVIEW OF SYSTEMS: ROS   Negative for: Constitutional, Gastrointestinal, Neurological, Skin, Genitourinary, Musculoskeletal, HENT, Endocrine, Cardiovascular, Eyes, Respiratory, Psychiatric, Allergic/Imm, Heme/Lymph Last edited by Silvestre Moment on 09/22/2021  9:37 AM.       ALLERGIES No Known Allergies  PAST MEDICAL HISTORY Past Medical History:  Diagnosis Date   Anxiety    Baker's cyst    Chest pain    Diabetes mellitus type 2, uncontrolled    Diabetes mellitus without complication (HCC)    Disc disease, degenerative, cervical    Diverticulosis of colon    DJD (degenerative joint disease) of knee  Facial pain, atypical    GERD (gastroesophageal reflux disease)    Glaucoma    Hypercholesteremia    Hypertension    Lumbar spondylosis    Obesity    Onychomycosis    Osteopenia    Past Surgical History:  Procedure Laterality Date   right eye surgery  09/2010   retina in right eye   VAGINAL HYSTERECTOMY  2002   Dr. Kem Kays repair    FAMILY HISTORY Family History  Problem Relation Age of Onset   Diabetes Mother    Colon cancer Neg Hx    Stomach cancer Neg Hx     SOCIAL HISTORY Social History   Tobacco Use   Smoking status: Never   Smokeless tobacco: Never  Vaping Use   Vaping Use: Never  used  Substance Use Topics   Alcohol use: No   Drug use: No         OPHTHALMIC EXAM:  Base Eye Exam     Visual Acuity (ETDRS)       Right Left   Dist cc 20/30 +1 20/25 -2   Dist ph cc NI     Correction: Glasses         Tonometry (Tonopen, 9:42 AM)       Right Left   Pressure 11 9         Pupils       Pupils APD   Right PERRL None   Left PERRL None         Visual Fields       Left Right    Full Full         Extraocular Movement       Right Left    Full Full         Neuro/Psych     Oriented x3: Yes   Mood/Affect: Normal         Dilation     Left eye: 2.5% Phenylephrine, 1.0% Mydriacyl @ 9:42 AM           Slit Lamp and Fundus Exam     External Exam       Right Left   External Normal Normal         Slit Lamp Exam       Right Left   Lids/Lashes Normal Normal   Conjunctiva/Sclera White and quiet White and quiet   Cornea Clear Clear   Anterior Chamber Deep and quiet Deep and quiet   Iris Round and reactive Round and reactive   Lens Posterior chamber intraocular lens Posterior chamber intraocular lens   Anterior Vitreous Normal Normal         Fundus Exam       Right Left   Posterior Vitreous  Posterior vitreous detachment, Central vitreous floaters   Disc  Normal   C/D Ratio  0.7   Macula  Microaneurysms, increased macular thickening, Cystoid macular edema, Moderate clinically significant macular edema   Vessels  NPDR, moderate   Periphery  Normal            IMAGING AND PROCEDURES  Imaging and Procedures for 09/22/21  OCT, Retina - OU - Both Eyes       Right Eye Quality was good. Scan locations included subfoveal. Central Foveal Thickness: 301. Progression has improved. Findings include abnormal foveal contour, cystoid macular edema.   Left Eye Quality was good. Scan locations included subfoveal. Central Foveal Thickness: 228. Progression has improved. Findings include abnormal foveal contour, cystoid  macular edema.  Notes OS vastly improved 8 weeks post injection antivegF for center involved CSME.,  Repeat injection OS today to maintain  OD, overall improved as well region superotemporal today at 6-week follow-up, which will be chronic with cavitary old CME.  We will need to maintain follow-up interval right eye of 8 weeks,     Intravitreal Injection, Pharmacologic Agent - OS - Left Eye       Time Out 09/22/2021. 10:19 AM. Confirmed correct patient, procedure, site, and patient consented.   Anesthesia Topical anesthesia was used. Anesthetic medications included Lidocaine 4%, [place] 3.5%.   Procedure Preparation included 5% betadine to ocular surface, 10% betadine to eyelids, Tobramycin 0.3%, Ofloxacin . A 30 gauge needle was used.   Injection: 2 mg aflibercept 2 MG/0.05ML   Route: Intravitreal, Site: Left Eye   NDC: [id number], Lot: [id number], Waste: 0 mL   Post-op Post injection exam found visual acuity of at least counting fingers. The patient tolerated the procedure well. There were no complications. The patient received written and verbal post procedure care education. Post injection medications included gentamicin.              ASSESSMENT/PLAN:  OSA on CPAP Remains compliant on CPAP use      ICD-10-CM   1. Moderate nonproliferative diabetic retinopathy of left eye with macular edema associated with type 2 diabetes mellitus (HCC)  D63.8756 OCT, Retina - OU - Both Eyes    Intravitreal Injection, Pharmacologic Agent - OS - Left Eye    aflibercept (EYLEA) SOLN 2 mg    2. OSA on CPAP  G47.33    Z99.89       1.  OS, center involved CSME and temporal area of of MAC-TEL on top of diabetic macular edema now well controlled with Eylea but only in concert with significant pliant with CPAP use in order to prevent nightly hypoxic and hypertensive damage to the macula.  2.  OD, follow-up as scheduled  3.  OS, will again follow-up next in 8 weeks left  eye  Ophthalmic Meds Ordered this visit:  Meds ordered this encounter  Medications   aflibercept (EYLEA) SOLN 2 mg       Return in about 8 weeks (around 11/17/2021) for dilate, OS, EYLEA OCT,, and follow-up OD as scheduled.  There are no Patient Instructions on file for this visit.   Explained the diagnoses, plan, and follow up with the patient and they expressed understanding.  Patient expressed understanding of the importance of proper follow up care.   Clent Demark Delicia Berens M.D. Diseases & Surgery of the Retina and Vitreous Retina [facility] Diabetic [place] 09/22/21     Abbreviations: M myopia (nearsighted); A astigmatism; H hyperopia (farsighted); P presbyopia; Mrx spectacle prescription;  CTL contact lenses; OD right eye; OS left eye; OU both eyes  XT exotropia; ET esotropia; PEK punctate epithelial keratitis; PEE punctate epithelial erosions; DES dry eye syndrome; MGD meibomian gland dysfunction; ATs artificial tears; PFAT's preservative free artificial tears; [place] nuclear sclerotic cataract; PSC posterior subcapsular cataract; ERM epi-retinal membrane; PVD posterior vitreous detachment; RD retinal detachment; DM diabetes mellitus; DR diabetic retinopathy; NPDR non-proliferative diabetic retinopathy; PDR proliferative diabetic retinopathy; CSME clinically significant macular edema; DME diabetic macular edema; dbh dot blot hemorrhages; CWS cotton wool spot; POAG primary open angle glaucoma; C/D cup-to-disc ratio; HVF humphrey visual field; GVF goldmann visual field; OCT optical coherence tomography; IOP intraocular pressure; BRVO Branch retinal vein occlusion; CRVO central retinal vein occlusion; CRAO central retinal artery occlusion; BRAO branch  retinal artery occlusion; RT retinal tear; SB scleral buckle; PPV pars plana vitrectomy; VH Vitreous hemorrhage; PRP panretinal laser photocoagulation; IVK intravitreal kenalog; VMT vitreomacular traction; MH Macular hole;  NVD neovascularization of the  disc; NVE neovascularization elsewhere; AREDS age related eye disease study; ARMD age related macular degeneration; POAG primary open angle glaucoma; EBMD epithelial/anterior basement membrane dystrophy; ACIOL anterior chamber intraocular lens; IOL intraocular lens; PCIOL posterior chamber intraocular lens; Phaco/IOL phacoemulsification with intraocular lens placement; [place] photorefractive keratectomy; LASIK laser assisted in situ keratomileusis; HTN hypertension; DM diabetes mellitus; COPD chronic obstructive pulmonary disease

## 2021-09-28 ENCOUNTER — Telehealth: Payer: Self-pay | Admitting: Adult Health

## 2021-09-28 NOTE — Telephone Encounter (Signed)
I called pt and she is having issues with her machine,  she states she is using but it is not computing,  only zeroes.  I told her I would send a message to my contact and I did give her # to contact as well.  (She states the humidity container does not dry out either.  Caryl Pina will reach out to her.

## 2021-09-28 NOTE — Telephone Encounter (Signed)
All zeros come across screen on CPAP for her hours, pt asking for a call to discuss.

## 2021-10-06 ENCOUNTER — Encounter (INDEPENDENT_AMBULATORY_CARE_PROVIDER_SITE_OTHER): Payer: Self-pay | Admitting: Ophthalmology

## 2021-10-06 ENCOUNTER — Encounter (INDEPENDENT_AMBULATORY_CARE_PROVIDER_SITE_OTHER): Payer: HMO | Admitting: Ophthalmology

## 2021-10-06 ENCOUNTER — Ambulatory Visit (INDEPENDENT_AMBULATORY_CARE_PROVIDER_SITE_OTHER): Payer: HMO | Admitting: Ophthalmology

## 2021-10-06 DIAGNOSIS — G4733 Obstructive sleep apnea (adult) (pediatric): Secondary | ICD-10-CM | POA: Diagnosis not present

## 2021-10-06 DIAGNOSIS — E113311 Type 2 diabetes mellitus with moderate nonproliferative diabetic retinopathy with macular edema, right eye: Secondary | ICD-10-CM

## 2021-10-06 DIAGNOSIS — Z9989 Dependence on other enabling machines and devices: Secondary | ICD-10-CM

## 2021-10-06 DIAGNOSIS — E113312 Type 2 diabetes mellitus with moderate nonproliferative diabetic retinopathy with macular edema, left eye: Secondary | ICD-10-CM | POA: Diagnosis not present

## 2021-10-06 DIAGNOSIS — H35071 Retinal telangiectasis, right eye: Secondary | ICD-10-CM

## 2021-10-06 MED ORDER — BEVACIZUMAB 2.5 MG/0.1ML IZ SOSY
2.5000 mg | PREFILLED_SYRINGE | INTRAVITREAL | Status: AC | PRN
  Administered 2021-10-06: 2.5 mg via INTRAVITREAL

## 2021-10-06 NOTE — Assessment & Plan Note (Signed)
OS doing very well, follow-up as scheduled, as recent injection 2 weeks previous

## 2021-10-06 NOTE — Progress Notes (Signed)
10/06/2021     CHIEF COMPLAINT Patient presents for  Chief Complaint  Patient presents with   Diabetic Retinopathy with Macular Edema      HISTORY OF PRESENT ILLNESS: Whitney [name] is a 87 y.o. female who presents to the clinic today for:   HPI   8 weeks dilate OD, Avastin OCT.  Last edited by Laurin Coder on 10/06/2021 10:13 AM.      Referring physician: [name], MD [place],  [place] [address]  HISTORICAL INFORMATION:   Selected notes from the MEDICAL RECORD NUMBER    Lab Results  Component Value Date   HGBA1C 7.9 (H) 07/21/2021     CURRENT MEDICATIONS: Current Outpatient Medications (Ophthalmic Drugs)  Medication Sig   dorzolamide-timolol (COSOPT) 22.3-6.8 MG/ML ophthalmic solution Instill 1 drop in each eye twice a day   DUREZOL 0.05 % EMUL  (Patient not taking: Reported on 09/16/2021)   No current facility-administered medications for this visit. (Ophthalmic Drugs)   Current Outpatient Medications (Other)  Medication Sig   Ascorbic Acid (VITAMIN C) 100 MG tablet Take 100 mg by mouth daily.   aspirin 81 MG tablet Take 1 tablet (81 mg total) by mouth daily.   blood glucose meter kit and supplies KIT Use to test blood sugar up to 3 times daily. DX E11.9   Calcium Carb-Cholecalciferol (CALCIUM-VITAMIN D) 500-200 MG-UNIT tablet Take 1 tablet by mouth 2 (two) times daily with a meal.   diclofenac Sodium (VOLTAREN) 1 % GEL Apply 4 g topically 4 (four) times daily.   empagliflozin (JARDIANCE) 25 MG TABS tablet Take 1 tablet (25 mg total) by mouth daily before breakfast.   glimepiride (AMARYL) 4 MG tablet TAKE 1 TABLET EVERY DAY BEFORE BREAKFAST   Lancet Devices (ACCU-CHEK SOFTCLIX) lancets Use to test blood sugar up to 3 times a day. DX E11.9   lisinopril-hydrochlorothiazide (ZESTORETIC) 20-12.5 MG tablet Take 1 tablet by mouth daily.   metFORMIN (GLUCOPHAGE) 1000 MG tablet Take 1 tablet (1,000 mg total) by mouth 2 (two) times  daily with a meal.   Multiple Vitamin (MULTI VITAMIN DAILY PO) Take one by mouth daily   ONETOUCH VERIO test strip Use to test blood sugar 3 times a day   simvastatin (ZOCOR) 20 MG tablet Take 1 tablet (20 mg total) by mouth at bedtime.   zinc gluconate 50 MG tablet Take 50 mg by mouth daily.   No current facility-administered medications for this visit. (Other)      REVIEW OF SYSTEMS: ROS   Negative for: Constitutional, Gastrointestinal, Neurological, Skin, Genitourinary, Musculoskeletal, HENT, Endocrine, Cardiovascular, Eyes, Respiratory, Psychiatric, Allergic/Imm, Heme/Lymph Last edited by Hurman Horn, MD on 10/06/2021 11:19 AM.       ALLERGIES No Known Allergies  PAST MEDICAL HISTORY Past Medical History:  Diagnosis Date   Anxiety    Baker's cyst    Chest pain    Diabetes mellitus type 2, uncontrolled    Diabetes mellitus without complication (HCC)    Disc disease, degenerative, cervical    Diverticulosis of colon    DJD (degenerative joint disease) of knee    Facial pain, atypical    GERD (gastroesophageal reflux disease)    Glaucoma    Hypercholesteremia    Hypertension    Lumbar spondylosis    Obesity    Onychomycosis    Osteopenia    Past Surgical History:  Procedure Laterality Date   right eye surgery  09/2010   retina in right  eye   VAGINAL HYSTERECTOMY  2002   Dr. Kem Kays repair    FAMILY HISTORY Family History  Problem Relation Age of Onset   Diabetes Mother    Colon cancer Neg Hx    Stomach cancer Neg Hx     SOCIAL HISTORY Social History   Tobacco Use   Smoking status: Never   Smokeless tobacco: Never  Vaping Use   Vaping Use: Never used  Substance Use Topics   Alcohol use: No   Drug use: No         OPHTHALMIC EXAM:  Base Eye Exam     Visual Acuity (ETDRS)       Right Left   Dist cc 20/30 -1 20/25 -2   Dist ph cc NI     Correction: Glasses         Tonometry (Tonopen, 10:18 AM)       Right Left   Pressure 9 7          Pupils       Shape React APD   Right Irregular Minimal None   Left Irregular Minimal None         Extraocular Movement       Right Left    Full Full         Neuro/Psych     Oriented x3: Yes   Mood/Affect: Normal         Dilation     Right eye: 1.0% Mydriacyl, 2.5% Phenylephrine @ 10:18 AM           Slit Lamp and Fundus Exam     External Exam       Right Left   External Normal Normal         Slit Lamp Exam       Right Left   Lids/Lashes Normal Normal   Conjunctiva/Sclera White and quiet White and quiet   Cornea Clear Clear   Anterior Chamber Deep and quiet Deep and quiet   Iris Round and reactive Round and reactive   Lens Posterior chamber intraocular lens Posterior chamber intraocular lens   Anterior Vitreous Normal Normal         Fundus Exam       Right Left   Posterior Vitreous Normal    Disc Normal    C/D Ratio 0.55    Macula Microaneurysms, trace clinical CSME    Vessels NPDR, moderate    Periphery Normal             IMAGING AND PROCEDURES  Imaging and Procedures for 10/12/21  OCT, Retina - OU - Both Eyes       Right Eye Quality was good. Scan locations included subfoveal. Central Foveal Thickness: 349. Progression has improved. Findings include abnormal foveal contour, cystoid macular edema.   Left Eye Quality was good. Scan locations included subfoveal. Central Foveal Thickness: 228. Progression has improved. Findings include abnormal foveal contour, cystoid macular edema.   Notes OS vastly improved 8 weeks post injection antivegF for center involved CSME.,  Repeat evaluation OS as scheduled  OD, overall improved as well region superotemporal today at 6-week follow-up, which will be chronic with cavitary old CME.  Yet some recurrence of CME 2 weeks later thus we will need to maintain follow-up interval right eye of 8 weeks,     Intravitreal Injection, Pharmacologic Agent - OD - Right Eye       Time  Out 10/06/2021. 11:21 AM. Confirmed correct patient, procedure, site, and patient consented.  Anesthesia Topical anesthesia was used. Anesthetic medications included Lidocaine 4%.   Procedure Preparation included 5% betadine to ocular surface, 10% betadine to eyelids, Ofloxacin . A 30 gauge needle was used.   Injection: 2.5 mg bevacizumab 2.5 MG/0.1ML   Route: Intravitreal, Site: Right Eye   NDC: [phone], Lot: [id number], Expiration date: 11/12/2021   Post-op Post injection exam found visual acuity of at least counting fingers. The patient tolerated the procedure well. There were no complications. The patient received written and verbal post procedure care education. Post injection medications included ocuflox.              ASSESSMENT/PLAN:  Moderate nonproliferative diabetic retinopathy of right eye with macular edema (HCC) OD now extended interval tolerated of 8 weeks but still recurrent perifoveal CME.  Longer interval of treatment about available and possible now while patient uses CPAP successfully.  Repeat injection Avastin OD today and reevaluate again OD in 8 weeks  Moderate nonproliferative diabetic retinopathy of left eye with macular edema associated with type 2 diabetes mellitus ([place]) OS doing very well, follow-up as scheduled, as recent injection 2 weeks previous  Retinal telangiectasia of right eye Cause for residual CME temporally.  OSA on CPAP Compliant on CPAP, continues     ICD-10-CM   1. Moderate nonproliferative diabetic retinopathy of right eye with macular edema associated with type 2 diabetes mellitus (HCC)  E11.3311 OCT, Retina - OU - Both Eyes    Intravitreal Injection, Pharmacologic Agent - OD - Right Eye    bevacizumab (AVASTIN) SOSY 2.5 mg    2. Moderate nonproliferative diabetic retinopathy of left eye with macular edema associated with type 2 diabetes mellitus ([place])  O84.1660     3. Retinal telangiectasia of right eye  H35.071     4. OSA  on CPAP  G47.33    Z99.89       1.  OD, center threatening and involved CME, recurrent again from injection Avastin for 8 weeks previous.  Some of this may be chronicity from original MAC-TEL.  Nonetheless treatment does improve the CME at the 6-week interval.  We will repeat injection today and maintain 8-week evaluation OD  2.  OS follow-up as scheduled  3.  Ophthalmic Meds Ordered this visit:  Meds ordered this encounter  Medications   bevacizumab (AVASTIN) SOSY 2.5 mg       Return in about 8 weeks (around 12/01/2021) for dilate, OD, AVASTIN OCT,, and follow-up dilate OS Avastin OCT as scheduled.  There are no Patient Instructions on file for this visit.   Explained the diagnoses, plan, and follow up with the patient and they expressed understanding.  Patient expressed understanding of the importance of proper follow up care.   Clent Demark Yasenia Reedy M.D. Diseases & Surgery of the Retina and Vitreous Retina & Diabetic [place] 10/12/21     Abbreviations: M myopia (nearsighted); A astigmatism; H hyperopia (farsighted); P presbyopia; Mrx spectacle prescription;  CTL contact lenses; OD right eye; OS left eye; OU both eyes  XT exotropia; ET esotropia; PEK punctate epithelial keratitis; PEE punctate epithelial erosions; DES dry eye syndrome; MGD meibomian gland dysfunction; ATs artificial tears; PFAT's preservative free artificial tears; [place] nuclear sclerotic cataract; PSC posterior subcapsular cataract; ERM epi-retinal membrane; PVD posterior vitreous detachment; RD retinal detachment; DM diabetes mellitus; DR diabetic retinopathy; NPDR non-proliferative diabetic retinopathy; PDR proliferative diabetic retinopathy; CSME clinically significant macular edema; DME diabetic macular edema; dbh dot blot hemorrhages; CWS cotton wool spot; POAG primary open angle glaucoma; C/D  cup-to-disc ratio; HVF humphrey visual field; GVF goldmann visual field; OCT optical coherence tomography; IOP  intraocular pressure; BRVO Branch retinal vein occlusion; CRVO central retinal vein occlusion; CRAO central retinal artery occlusion; BRAO branch retinal artery occlusion; RT retinal tear; SB scleral buckle; PPV pars plana vitrectomy; VH Vitreous hemorrhage; PRP panretinal laser photocoagulation; IVK intravitreal kenalog; VMT vitreomacular traction; MH Macular hole;  NVD neovascularization of the disc; NVE neovascularization elsewhere; AREDS age related eye disease study; ARMD age related macular degeneration; POAG primary open angle glaucoma; EBMD epithelial/anterior basement membrane dystrophy; ACIOL anterior chamber intraocular lens; IOL intraocular lens; PCIOL posterior chamber intraocular lens; Phaco/IOL phacoemulsification with intraocular lens placement; [place] photorefractive keratectomy; LASIK laser assisted in situ keratomileusis; HTN hypertension; DM diabetes mellitus; COPD chronic obstructive pulmonary disease

## 2021-10-06 NOTE — Assessment & Plan Note (Signed)
OD now extended interval tolerated of 8 weeks but still recurrent perifoveal CME.  Longer interval of treatment about available and possible now while patient uses CPAP successfully.  Repeat injection Avastin OD today and reevaluate again OD in 8 weeks

## 2021-10-09 DIAGNOSIS — E1159 Type 2 diabetes mellitus with other circulatory complications: Secondary | ICD-10-CM | POA: Diagnosis not present

## 2021-10-09 DIAGNOSIS — Z7984 Long term (current) use of oral hypoglycemic drugs: Secondary | ICD-10-CM | POA: Diagnosis not present

## 2021-10-09 DIAGNOSIS — I10 Essential (primary) hypertension: Secondary | ICD-10-CM | POA: Diagnosis not present

## 2021-10-09 DIAGNOSIS — E785 Hyperlipidemia, unspecified: Secondary | ICD-10-CM | POA: Diagnosis not present

## 2021-10-12 ENCOUNTER — Ambulatory Visit (INDEPENDENT_AMBULATORY_CARE_PROVIDER_SITE_OTHER): Payer: HMO | Admitting: *Deleted

## 2021-10-12 DIAGNOSIS — E1169 Type 2 diabetes mellitus with other specified complication: Secondary | ICD-10-CM

## 2021-10-12 NOTE — Assessment & Plan Note (Signed)
Compliant on CPAP, continues

## 2021-10-12 NOTE — Chronic Care Management (AMB) (Signed)
Chronic Care Management   CCM RN Visit Note  10/12/2021 Name: Whitney [name] MRN: [id number] DOB: [birth year]  Subjective: Whitney [name] is a 87 y.o. year old female who is a primary care patient of [name], MD. The care management team was consulted for assistance with disease management and care coordination needs.    Engaged with patient by telephone for follow up visit/ unscheduled outreach/ CCM RN CM case closure in response to provider referral for case management and/or care coordination services.   Consent to Services:  The patient was given information about Chronic Care Management services, agreed to services, and gave verbal consent prior to initiation of services.  Please see initial visit note for detailed documentation.  Patient agreed to services and verbal consent obtained.   Assessment: Review of patient past medical history, allergies, medications, health status, including review of consultants reports, laboratory and other test data, was performed as part of comprehensive evaluation and provision of chronic care management services.   SDOH (Social Determinants of Health) assessments and interventions performed:  SDOH Interventions    Flowsheet Row Most Recent Value  SDOH Interventions   Food Insecurity Interventions Intervention Not Indicated  [continues to deny food insecurity]  Transportation Interventions Intervention Not Indicated  [family continues to provide transportation]     CCM Care Plan  No Known Allergies  Outpatient Encounter Medications as of 10/12/2021  Medication Sig Note   Ascorbic Acid (VITAMIN C) 100 MG tablet Take 100 mg by mouth daily.    aspirin 81 MG tablet Take 1 tablet (81 mg total) by mouth daily.    blood glucose meter kit and supplies KIT Use to test blood sugar up to 3 times daily. DX E11.9    Calcium Carb-Cholecalciferol (CALCIUM-VITAMIN D) 500-200 MG-UNIT tablet Take 1 tablet by mouth 2 (two) times daily with a meal.     diclofenac Sodium (VOLTAREN) 1 % GEL Apply 4 g topically 4 (four) times daily.    dorzolamide-timolol (COSOPT) 22.3-6.8 MG/ML ophthalmic solution Instill 1 drop in each eye twice a day    DUREZOL 0.05 % EMUL  (Patient not taking: Reported on 09/16/2021) 03/17/2015: Received from: External Pharmacy   empagliflozin (JARDIANCE) 25 MG TABS tablet Take 1 tablet (25 mg total) by mouth daily before breakfast.    glimepiride (AMARYL) 4 MG tablet TAKE 1 TABLET EVERY DAY BEFORE BREAKFAST    Lancet Devices (ACCU-CHEK SOFTCLIX) lancets Use to test blood sugar up to 3 times a day. DX E11.9    lisinopril-hydrochlorothiazide (ZESTORETIC) 20-12.5 MG tablet Take 1 tablet by mouth daily.    metFORMIN (GLUCOPHAGE) 1000 MG tablet Take 1 tablet (1,000 mg total) by mouth 2 (two) times daily with a meal.    Multiple Vitamin (MULTI VITAMIN DAILY PO) Take one by mouth daily    ONETOUCH VERIO test strip Use to test blood sugar 3 times a day    simvastatin (ZOCOR) 20 MG tablet Take 1 tablet (20 mg total) by mouth at bedtime.    zinc gluconate 50 MG tablet Take 50 mg by mouth daily.    No facility-administered encounter medications on file as of 10/12/2021.   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 associated with type 2 diabetes mellitus ([name]) 08/13/2019   Moderate nonproliferative diabetic retinopathy of right eye with macular edema ([place]) 08/13/2019  Type 2 macular telangiectasis, left 08/13/2019   Retinal telangiectasia of right eye 08/13/2019   OSA on CPAP 07/20/2017   Venous insufficiency 04/26/2017   Sensorineural hearing loss (SNHL), bilateral 12/08/2016   Routine general medical examination at a health care facility 05/25/2016   Type 2 diabetes mellitus with hyperlipidemia ([place]) 02/23/2012   GLAUCOMA 08/24/2009   Left shoulder pain 02/19/2008    Hyperlipidemia associated with type 2 diabetes mellitus ([place]) 12/22/2006   Essential hypertension 12/22/2006   GERD 12/22/2006   [place] DISEASE, CERVICAL 12/22/2006   Osteopenia 12/22/2006   Conditions to be addressed/monitored:  HLD and DMII  Care Plan : RN Care Manager Plan of Care  Updates made by Knox Royalty, RN since 10/12/2021 12:00 AM     Problem: Chronic Disease Management Needs   Priority: High     Long-Range Goal: Ongoing adherence to established plan of care for long term chronic disease management   Start Date: 02/25/2021  Expected End Date: 02/25/2022  Priority: High  Note:   Current Barriers:  Chronic Disease Management support and education needs related to HLD and DMII Hard of hearing- lost hearing aid for (L) ear- verbalizes plans to replace in coming months; 06/15/21 and 07/24/21-- reports has not yet replaced; "still looking into it" Retinal issues- sees specialist for eye injections; occasional issues with reading; able to drive short distances; family assists with driving long distances    RNCM Clinical Goal(s):  Patient will demonstrate Ongoing health management independence DMII; HLD  through collaboration with RN Care manager, provider, and care team.   Interventions: 1:1 collaboration with primary care provider regarding development and update of comprehensive plan of care as evidenced by provider attestation and co-signature Inter-disciplinary care team collaboration (see longitudinal plan of care) Evaluation of current treatment plan related to  self management and patient's adherence to plan as established by provider 03/19/21- Initial assessment completed Review of patient status, including review of consultants reports, relevant laboratory and other test results, and medications completed SDOH updated: no new/ unmet concerns identified Pain assessment updated: reports ongoing intermittent chronic pain in knees; reports new "issue" today with foot-  states she woke up to "swollen and uncomfortable (R) foot" on Friday 10/09/21- reports she applied the voltaren gel, elevated feet, and also soaked in Epsom salt bath: reports "a little better today, less swollen, but still uncomfortable when I walk;" denies pain today during our conversation- she is sitting down and declines quantification of pain; using cane for ambulation and this was encouraged; advised patient to contact PCP to have this new issue with (R) foot evaluated- she will do Falls assessment updated: continues to deny new/ recent falls since her last fall this Spring; continues using cane as indicated;  positive reinforcement provided for no new falls, with encouragement to continue efforts at fall prevention; previously provided education around fall risks/ prevention reinforced Medications discussed: reports continues to independently self-manage and denies current concerns/ issues/ questions around medications; endorses adherence to taking all medications as prescribed Confirms that the previously reported issue with her eye drops has been resolved; she has the eye drops she needs and has maintained regular visits with her retinal specialist Reviewed upcoming scheduled provider appointments: 11/17/21 and 12/01/21- retinal specialist; 01/20/22- PCP patient confirms is aware of all and has plans to attend as scheduled Discussed plans with patient for ongoing care management follow up- patient denies current care coordination/ care management needs and is agreeable to CCM RN CM case closure today; verbalizes understanding  to contact PCP or other care providers for any needs that arise in the future, and confirms she has contact information for all care providers     Hyperlipidemia/ HTN Interventions:  GOAL STATUS: 10/12/21: Goal met; Long Term goal Reviewed importance of limiting foods high in cholesterol Confirmed patient continues monitoring/ recording blood pressures at home: she does not have  specific values to share with me today, she is not near her recorded home values; she tells me blood pressures "have all been okay;" and confirms she continues trying to follow heart healthy, low salt, low cholesterol/ low sugar diet  Diabetes Interventions: GOAL STATUS: 10/12/21: Goal met; Long Term Goal Provided education to patient about basic DM disease process Counseled on importance of regular laboratory monitoring as prescribed Confirmed patient continues to monitor/ record blood sugars at home BID-TID: reports consistent fasting blood sugar values at home between 119- 140; states blood sugar this morning was "119;" she does not have other fasting or post-prandial values to review today- she is not near her home monitoring log Confirmed no recent low blood sugars at home: briefly reinforced previously provided education around signs/ symptoms low blood sugar, along with corresponding action plan Encouraged patient again to discuss PCP set individual A1-C goals for her-- with previous discussion, patient's stated goals were much different from PCP stated goal  Lab Results  Component Value Date   HGBA1C 7.9 (H) 07/21/2021     Plan: No further follow up required: patient denies care coordination/ care management needs and is agreeable to CCM RN CM case closure today; CCM RN CM case closure accordingly     [name], RN, BSN, [place] [phone]: direct office

## 2021-10-12 NOTE — Assessment & Plan Note (Signed)
Cause for residual CME temporally.

## 2021-10-21 ENCOUNTER — Telehealth: Payer: HMO

## 2021-11-09 DIAGNOSIS — E1169 Type 2 diabetes mellitus with other specified complication: Secondary | ICD-10-CM

## 2021-11-09 DIAGNOSIS — E785 Hyperlipidemia, unspecified: Secondary | ICD-10-CM | POA: Diagnosis not present

## 2021-11-14 DIAGNOSIS — G4733 Obstructive sleep apnea (adult) (pediatric): Secondary | ICD-10-CM | POA: Diagnosis not present

## 2021-11-17 ENCOUNTER — Encounter (INDEPENDENT_AMBULATORY_CARE_PROVIDER_SITE_OTHER): Payer: Self-pay | Admitting: Ophthalmology

## 2021-11-17 ENCOUNTER — Ambulatory Visit (INDEPENDENT_AMBULATORY_CARE_PROVIDER_SITE_OTHER): Payer: HMO | Admitting: Ophthalmology

## 2021-11-17 DIAGNOSIS — E113312 Type 2 diabetes mellitus with moderate nonproliferative diabetic retinopathy with macular edema, left eye: Secondary | ICD-10-CM

## 2021-11-17 DIAGNOSIS — H35072 Retinal telangiectasis, left eye: Secondary | ICD-10-CM | POA: Diagnosis not present

## 2021-11-17 MED ORDER — AFLIBERCEPT 2MG/0.05ML IZ SOLN FOR KALEIDOSCOPE
2.0000 mg | INTRAVITREAL | Status: AC | PRN
  Administered 2021-11-17: 2 mg via INTRAVITREAL

## 2021-11-17 NOTE — Assessment & Plan Note (Signed)
OD doing well follow-up as scheduled

## 2021-11-17 NOTE — Assessment & Plan Note (Signed)
Now 8-week interval and doing very well.  On intravitreal Eylea.  We will repeat injection today and reevaluate next in 9 to 10 weeks

## 2021-11-17 NOTE — Assessment & Plan Note (Signed)
This component of disease superimposed upon diabetic maculopathy has been controlled since patient continues on CPAP.  CPAP use is also been associated with duration of follow-up.  Between treatment of diabetic macular edema

## 2021-11-17 NOTE — Progress Notes (Signed)
11/17/2021     CHIEF COMPLAINT Patient presents for  Chief Complaint  Patient presents with   Diabetic Retinopathy with Macular Edema      HISTORY OF PRESENT ILLNESS: Whitney [name] is a 87 y.o. female who presents to the clinic today for:   HPI     8 weeks dilate OS Eylea OCT Pt states her vision has been stable Pt denies any new floaters or FOL  Last edited by Hurman Horn, MD on 11/17/2021 11:21 AM.      Referring physician: [name], MD [place],  [place] [address]  HISTORICAL INFORMATION:   Selected notes from the MEDICAL RECORD NUMBER    Lab Results  Component Value Date   HGBA1C 7.9 (H) 07/21/2021     CURRENT MEDICATIONS: Current Outpatient Medications (Ophthalmic Drugs)  Medication Sig   dorzolamide-timolol (COSOPT) 22.3-6.8 MG/ML ophthalmic solution Instill 1 drop in each eye twice a day   DUREZOL 0.05 % EMUL  (Patient not taking: Reported on 09/16/2021)   No current facility-administered medications for this visit. (Ophthalmic Drugs)   Current Outpatient Medications (Other)  Medication Sig   Ascorbic Acid (VITAMIN C) 100 MG tablet Take 100 mg by mouth daily.   aspirin 81 MG tablet Take 1 tablet (81 mg total) by mouth daily.   blood glucose meter kit and supplies KIT Use to test blood sugar up to 3 times daily. DX E11.9   Calcium Carb-Cholecalciferol (CALCIUM-VITAMIN D) 500-200 MG-UNIT tablet Take 1 tablet by mouth 2 (two) times daily with a meal.   diclofenac Sodium (VOLTAREN) 1 % GEL Apply 4 g topically 4 (four) times daily.   empagliflozin (JARDIANCE) 25 MG TABS tablet Take 1 tablet (25 mg total) by mouth daily before breakfast.   glimepiride (AMARYL) 4 MG tablet TAKE 1 TABLET EVERY DAY BEFORE BREAKFAST   Lancet Devices (ACCU-CHEK SOFTCLIX) lancets Use to test blood sugar up to 3 times a day. DX E11.9   lisinopril-hydrochlorothiazide (ZESTORETIC) 20-12.5 MG tablet Take 1 tablet by mouth daily.   metFORMIN (GLUCOPHAGE)  1000 MG tablet Take 1 tablet (1,000 mg total) by mouth 2 (two) times daily with a meal.   Multiple Vitamin (MULTI VITAMIN DAILY PO) Take one by mouth daily   ONETOUCH VERIO test strip Use to test blood sugar 3 times a day   simvastatin (ZOCOR) 20 MG tablet Take 1 tablet (20 mg total) by mouth at bedtime.   zinc gluconate 50 MG tablet Take 50 mg by mouth daily.   No current facility-administered medications for this visit. (Other)      REVIEW OF SYSTEMS: ROS   Negative for: Constitutional, Gastrointestinal, Neurological, Skin, Genitourinary, Musculoskeletal, HENT, Endocrine, Cardiovascular, Eyes, Respiratory, Psychiatric, Allergic/Imm, Heme/Lymph Last edited by Morene Rankins, CMA on 11/17/2021 10:49 AM.       ALLERGIES No Known Allergies  PAST MEDICAL HISTORY Past Medical History:  Diagnosis Date   Anxiety    Baker's cyst    Chest pain    Diabetes mellitus type 2, uncontrolled    Diabetes mellitus without complication (HCC)    Disc disease, degenerative, cervical    Diverticulosis of colon    DJD (degenerative joint disease) of knee    Facial pain, atypical    GERD (gastroesophageal reflux disease)    Glaucoma    Hypercholesteremia    Hypertension    Lumbar spondylosis    Obesity    Onychomycosis    Osteopenia    Past Surgical  History:  Procedure Laterality Date   right eye surgery  09/2010   retina in right eye   VAGINAL HYSTERECTOMY  2002   Dr. Kem Kays repair    FAMILY HISTORY Family History  Problem Relation Age of Onset   Diabetes Mother    Colon cancer Neg Hx    Stomach cancer Neg Hx     SOCIAL HISTORY Social History   Tobacco Use   Smoking status: Never   Smokeless tobacco: Never  Vaping Use   Vaping Use: Never used  Substance Use Topics   Alcohol use: No   Drug use: No         OPHTHALMIC EXAM:  Base Eye Exam     Visual Acuity (ETDRS)       Right Left   Dist cc 20/25 20/25 -1    Correction: Glasses         Tonometry  (Tonopen, 10:55 AM)       Right Left   Pressure 6 8         Pupils       Shape   Right Round   Left          Visual Fields       Left Right    Full Full         Neuro/Psych     Oriented x3: Yes   Mood/Affect: Normal         Dilation     Left eye: 2.5% Phenylephrine, 1.0% Mydriacyl @ 10:50 AM           Slit Lamp and Fundus Exam     External Exam       Right Left   External Normal Normal         Slit Lamp Exam       Right Left   Lids/Lashes Normal Normal   Conjunctiva/Sclera White and quiet White and quiet   Cornea Clear Clear   Anterior Chamber Deep and quiet Deep and quiet   Iris Round and reactive Round and reactive   Lens Posterior chamber intraocular lens Posterior chamber intraocular lens   Anterior Vitreous Normal Normal         Fundus Exam       Right Left   Posterior Vitreous Normal    Disc Normal    C/D Ratio 0.55    Macula Microaneurysms, trace clinical CSME    Vessels NPDR, moderate    Periphery Normal             IMAGING AND PROCEDURES  Imaging and Procedures for 11/17/21  OCT, Retina - OU - Both Eyes       Right Eye Quality was good. Scan locations included subfoveal. Central Foveal Thickness: 305. Progression has improved. Findings include abnormal foveal contour, cystoid macular edema.   Left Eye Quality was good. Scan locations included subfoveal. Central Foveal Thickness: 226. Progression has improved. Findings include abnormal foveal contour, cystoid macular edema.   Notes OS vastly improved 8 weeks post injection antivegF for center involved CSME.,  Repeat evaluation OS as scheduled  OD, overall improved as well region superotemporal today at 6-week follow-up, which will be chronic with cavitary old CME.  Yet some recurrence of CME 2 weeks later thus we will need to maintain follow-up interval right eye of 9 weeks,      Intravitreal Injection, Pharmacologic Agent - OS - Left Eye       Time  Out 11/17/2021. 11:24 AM. Confirmed correct patient, procedure,  site, and patient consented.   Anesthesia Topical anesthesia was used. Anesthetic medications included Lidocaine 4%, Akten 3.5%.   Procedure Preparation included 5% betadine to ocular surface, 10% betadine to eyelids, Tobramycin 0.3%, Ofloxacin . A 30 gauge needle was used.   Injection: 2 mg aflibercept 2 MG/0.05ML   Route: Intravitreal, Site: Left Eye   NDC: [id number], Lot: [id number], Expiration date: 12/13/2022, Waste: 0 mL   Post-op Post injection exam found visual acuity of at least counting fingers. The patient tolerated the procedure well. There were no complications. The patient received written and verbal post procedure care education. Post injection medications included gentamicin.              ASSESSMENT/PLAN:  Type 2 macular telangiectasis, left This component of disease superimposed upon diabetic maculopathy has been controlled since patient continues on CPAP.  CPAP use is also been associated with duration of follow-up.  Between treatment of diabetic macular edema  Moderate nonproliferative diabetic retinopathy of left eye with macular edema associated with type 2 diabetes mellitus (HCC) Now 8-week interval and doing very well.  On intravitreal Eylea.  We will repeat injection today and reevaluate next in 9 to 10 weeks  Moderate nonproliferative diabetic retinopathy of right eye with macular edema ([place]) OD doing well follow-up as scheduled     ICD-10-CM   1. Moderate nonproliferative diabetic retinopathy of left eye with macular edema associated with type 2 diabetes mellitus (HCC)  H41.7408 OCT, Retina - OU - Both Eyes    Intravitreal Injection, Pharmacologic Agent - OS - Left Eye    aflibercept (EYLEA) SOLN 2 mg    2. Type 2 macular telangiectasis, left  H35.072       1.  OS doing very well.  Patient's macular telangiectasis appears to be controlled as patient continues on effective CPAP  use.  2.  Superimposed diabetic macular edema is now treated will maintain on 8-week interval follow-up post injection Eylea.  Repeat injection today Eylea OS and follow-up next OS in 8 weeks  3.  Follow-up dilate OD as scheduled,  Ophthalmic Meds Ordered this visit:  Meds ordered this encounter  Medications   aflibercept (EYLEA) SOLN 2 mg       Return in about 10 weeks (around 01/26/2022) for dilate, OS, EYLEA OCT, and follow-up OD as scheduled.  There are no Patient Instructions on file for this visit.   Explained the diagnoses, plan, and follow up with the patient and they expressed understanding.  Patient expressed understanding of the importance of proper follow up care.   Clent Demark  M.D. Diseases & Surgery of the Retina and Vitreous Retina & Diabetic [place] 11/17/21     Abbreviations: M myopia (nearsighted); A astigmatism; H hyperopia (farsighted); P presbyopia; Mrx spectacle prescription;  CTL contact lenses; OD right eye; OS left eye; OU both eyes  XT exotropia; ET esotropia; PEK punctate epithelial keratitis; PEE punctate epithelial erosions; DES dry eye syndrome; MGD meibomian gland dysfunction; ATs artificial tears; PFAT's preservative free artificial tears; Tift nuclear sclerotic cataract; PSC posterior subcapsular cataract; ERM epi-retinal membrane; PVD posterior vitreous detachment; RD retinal detachment; DM diabetes mellitus; DR diabetic retinopathy; NPDR non-proliferative diabetic retinopathy; PDR proliferative diabetic retinopathy; CSME clinically significant macular edema; DME diabetic macular edema; dbh dot blot hemorrhages; CWS cotton wool spot; POAG primary open angle glaucoma; C/D cup-to-disc ratio; HVF humphrey visual field; GVF goldmann visual field; OCT optical coherence tomography; IOP intraocular pressure; BRVO Branch retinal vein occlusion; CRVO central retinal vein occlusion;  CRAO central retinal artery occlusion; BRAO branch retinal artery occlusion;  RT retinal tear; SB scleral buckle; PPV pars plana vitrectomy; VH Vitreous hemorrhage; PRP panretinal laser photocoagulation; IVK intravitreal kenalog; VMT vitreomacular traction; MH Macular hole;  NVD neovascularization of the disc; NVE neovascularization elsewhere; AREDS age related eye disease study; ARMD age related macular degeneration; POAG primary open angle glaucoma; EBMD epithelial/anterior basement membrane dystrophy; ACIOL anterior chamber intraocular lens; IOL intraocular lens; PCIOL posterior chamber intraocular lens; Phaco/IOL phacoemulsification with intraocular lens placement; [place] photorefractive keratectomy; LASIK laser assisted in situ keratomileusis; HTN hypertension; DM diabetes mellitus; COPD chronic obstructive pulmonary disease

## 2021-11-26 ENCOUNTER — Ambulatory Visit (INDEPENDENT_AMBULATORY_CARE_PROVIDER_SITE_OTHER): Payer: HMO | Admitting: Ophthalmology

## 2021-11-26 ENCOUNTER — Encounter (INDEPENDENT_AMBULATORY_CARE_PROVIDER_SITE_OTHER): Payer: Self-pay | Admitting: Ophthalmology

## 2021-11-26 DIAGNOSIS — E113311 Type 2 diabetes mellitus with moderate nonproliferative diabetic retinopathy with macular edema, right eye: Secondary | ICD-10-CM

## 2021-11-26 DIAGNOSIS — E113312 Type 2 diabetes mellitus with moderate nonproliferative diabetic retinopathy with macular edema, left eye: Secondary | ICD-10-CM

## 2021-11-26 DIAGNOSIS — G4733 Obstructive sleep apnea (adult) (pediatric): Secondary | ICD-10-CM

## 2021-11-26 DIAGNOSIS — Z9989 Dependence on other enabling machines and devices: Secondary | ICD-10-CM | POA: Diagnosis not present

## 2021-11-26 MED ORDER — BEVACIZUMAB CHEMO INJECTION 1.25MG/0.05ML SYRINGE FOR KALEIDOSCOPE
1.2500 mg | INTRAVITREAL | Status: AC | PRN
  Administered 2021-11-26: 1.25 mg via INTRAVITREAL

## 2021-11-26 NOTE — Progress Notes (Signed)
11/26/2021     CHIEF COMPLAINT Patient presents for  Chief Complaint  Patient presents with   Diabetic Retinopathy with Macular Edema      HISTORY OF PRESENT ILLNESS: Whitney [name] is a 87 y.o. female who presents to the clinic today for:   HPI   7 weeks for DILATE OD, AVATSIN OCT. Pt stated vision remained stable since last visit.  Last edited by Silvestre Moment on 11/26/2021 10:14 AM.      Referring physician: [name], MD [place],  [place] [id number]  HISTORICAL INFORMATION:   Selected notes from the MEDICAL RECORD NUMBER    Lab Results  Component Value Date   HGBA1C 7.9 (H) 07/21/2021     CURRENT MEDICATIONS: Current Outpatient Medications (Ophthalmic Drugs)  Medication Sig   dorzolamide-timolol (COSOPT) 22.3-6.8 MG/ML ophthalmic solution Instill 1 drop in each eye twice a day   DUREZOL 0.05 % EMUL  (Patient not taking: Reported on 09/16/2021)   No current facility-administered medications for this visit. (Ophthalmic Drugs)   Current Outpatient Medications (Other)  Medication Sig   Ascorbic Acid (VITAMIN C) 100 MG tablet Take 100 mg by mouth daily.   aspirin 81 MG tablet Take 1 tablet (81 mg total) by mouth daily.   blood glucose meter kit and supplies KIT Use to test blood sugar up to 3 times daily. DX E11.9   Calcium Carb-Cholecalciferol (CALCIUM-VITAMIN D) 500-200 MG-UNIT tablet Take 1 tablet by mouth 2 (two) times daily with a meal.   diclofenac Sodium (VOLTAREN) 1 % GEL Apply 4 g topically 4 (four) times daily.   empagliflozin (JARDIANCE) 25 MG TABS tablet Take 1 tablet (25 mg total) by mouth daily before breakfast.   glimepiride (AMARYL) 4 MG tablet TAKE 1 TABLET EVERY DAY BEFORE BREAKFAST   Lancet Devices (ACCU-CHEK SOFTCLIX) lancets Use to test blood sugar up to 3 times a day. DX E11.9   lisinopril-hydrochlorothiazide (ZESTORETIC) 20-12.5 MG tablet Take 1 tablet by mouth daily.   metFORMIN (GLUCOPHAGE) 1000 MG tablet Take 1  tablet (1,000 mg total) by mouth 2 (two) times daily with a meal.   Multiple Vitamin (MULTI VITAMIN DAILY PO) Take one by mouth daily   ONETOUCH VERIO test strip Use to test blood sugar 3 times a day   simvastatin (ZOCOR) 20 MG tablet Take 1 tablet (20 mg total) by mouth at bedtime.   zinc gluconate 50 MG tablet Take 50 mg by mouth daily.   No current facility-administered medications for this visit. (Other)      REVIEW OF SYSTEMS: ROS   Negative for: Constitutional, Gastrointestinal, Neurological, Skin, Genitourinary, Musculoskeletal, HENT, Endocrine, Cardiovascular, Eyes, Respiratory, Psychiatric, Allergic/Imm, Heme/Lymph Last edited by Silvestre Moment on 11/26/2021 10:14 AM.       ALLERGIES No Known Allergies  PAST MEDICAL HISTORY Past Medical History:  Diagnosis Date   Anxiety    Baker's cyst    Chest pain    Diabetes mellitus type 2, uncontrolled    Diabetes mellitus without complication (HCC)    Disc disease, degenerative, cervical    Diverticulosis of colon    DJD (degenerative joint disease) of knee    Facial pain, atypical    GERD (gastroesophageal reflux disease)    Glaucoma    Hypercholesteremia    Hypertension    Lumbar spondylosis    Obesity    Onychomycosis    Osteopenia    Past Surgical History:  Procedure Laterality Date   right eye surgery  09/2010   retina in right eye   VAGINAL HYSTERECTOMY  2002   Dr. Kem Kays repair    FAMILY HISTORY Family History  Problem Relation Age of Onset   Diabetes Mother    Colon cancer Neg Hx    Stomach cancer Neg Hx     SOCIAL HISTORY Social History   Tobacco Use   Smoking status: Never   Smokeless tobacco: Never  Vaping Use   Vaping Use: Never used  Substance Use Topics   Alcohol use: No   Drug use: No         OPHTHALMIC EXAM:  Base Eye Exam     Visual Acuity (ETDRS)       Right Left   Dist cc 20/30 -2 20/30 +2   Dist ph cc NI NI    Correction: Glasses         Tonometry (Tonopen, 10:21  AM)       Right Left   Pressure 15 15         Pupils       Pupils APD   Right PERRL None   Left PERRL None         Visual Fields       Left Right    Full Full         Extraocular Movement       Right Left    Full, Ortho Full, Ortho         Neuro/Psych     Oriented x3: Yes   Mood/Affect: Normal         Dilation     Right eye: 2.5% Phenylephrine, 1.0% Mydriacyl @ 10:21 AM           Slit Lamp and Fundus Exam     External Exam       Right Left   External Normal Normal         Slit Lamp Exam       Right Left   Lids/Lashes Normal Normal   Conjunctiva/Sclera White and quiet White and quiet   Cornea Clear Clear   Anterior Chamber Deep and quiet Deep and quiet   Iris Round and reactive Round and reactive   Lens Posterior chamber intraocular lens Posterior chamber intraocular lens   Anterior Vitreous Normal Normal         Fundus Exam       Right Left   Posterior Vitreous Normal    Disc Normal    C/D Ratio 0.55    Macula Microaneurysms, trace clinical CSME    Vessels NPDR, moderate    Periphery Normal             IMAGING AND PROCEDURES  Imaging and Procedures for 11/26/21  OCT, Retina - OU - Both Eyes       Right Eye Quality was good. Scan locations included subfoveal. Central Foveal Thickness: 265. Progression has improved. Findings include abnormal foveal contour, cystoid macular edema.   Left Eye Quality was good. Scan locations included subfoveal. Central Foveal Thickness: 226. Progression has improved. Findings include abnormal foveal contour, cystoid macular edema.   Notes OS vastly improved 1 weeks post injection antivegF for center involved CSME.,  Repeat evaluation OS as scheduled  OD, overall improved as well region superotemporal today at 7-week follow-up, which will be chronic with cavitary old CME.  thus we will need to extend follow-up interval right eye of 9 weeks,      Intravitreal Injection,  Pharmacologic Agent - OD -  Right Eye       Time Out 11/26/2021. 11:05 AM. Confirmed correct patient, procedure, site, and patient consented.   Anesthesia Topical anesthesia was used. Anesthetic medications included Lidocaine 4%.   Procedure Preparation included 5% betadine to ocular surface, 10% betadine to eyelids, Ofloxacin . A 30 gauge needle was used.   Injection: 1.25 mg Bevacizumab 1.96m/0.05ml   Route: Intravitreal, Site: Right Eye   NDC: [id number] Lot:: [id number] Expiration date: 02/24/2022   Post-op Post injection exam found visual acuity of at least counting fingers. The patient tolerated the procedure well. There were no complications. The patient received written and verbal post procedure care education. Post injection medications included ocuflox.              ASSESSMENT/PLAN:  Moderate nonproliferative diabetic retinopathy of left eye with macular edema associated with type 2 diabetes mellitus (HCC) Improved OS and stable 1 week after recent injection follow-up as scheduled  Moderate nonproliferative diabetic retinopathy of right eye with macular edema ([place] OD improved currently at 7-week follow-up.  Repeat injection today and follow-up next in 9  OSA on CPAP Compliant and improvement in macular condition and lengthening of interval follow-up coincident with successful use CPAP     ICD-10-CM   1. Moderate nonproliferative diabetic retinopathy of right eye with macular edema associated with type 2 diabetes mellitus (HCC)  E11.3311 OCT, Retina - OU - Both Eyes    Intravitreal Injection, Pharmacologic Agent - OD - Right Eye    Bevacizumab (AVASTIN) SOLN 1.25 mg    2. Moderate nonproliferative diabetic retinopathy of left eye with macular edema associated with type 2 diabetes mellitus ([place]  EY60.6301    3. OSA on CPAP  G47.33    Z99.89       1.  OD maintained improvement in macular edema.  Residual thickening likely from previous MAC-TEL.  Nonetheless  overall, intraretinal fluid and CME has improved on antivegF and now extending interval to 7 weeks successful and we will continue to extend interval examination OD next  2.  1 week post most recent injection follow-up as scheduled  3.  Ophthalmic Meds Ordered this visit:  Meds ordered this encounter  Medications   Bevacizumab (AVASTIN) SOLN 1.25 mg       Return in about 9 weeks (around 01/28/2022) for dilate, OD, AVASTIN OCT.  There are no Patient Instructions on file for this visit.   Explained the diagnoses, plan, and follow up with the patient and they expressed understanding.  Patient expressed understanding of the importance of proper follow up care.   GClent DemarkRankin M.D. Diseases & Surgery of the Retina and Vitreous Retina & Diabetic [place]08/17/23     Abbreviations: M myopia (nearsighted); A astigmatism; H hyperopia (farsighted); P presbyopia; Mrx spectacle prescription;  CTL contact lenses; OD right eye; OS left eye; OU both eyes  XT exotropia; ET esotropia; PEK punctate epithelial keratitis; PEE punctate epithelial erosions; DES dry eye syndrome; MGD meibomian gland dysfunction; ATs artificial tears; PFAT's preservative free artificial tears; N[place]nuclear sclerotic cataract; PSC posterior subcapsular cataract; ERM epi-retinal membrane; PVD posterior vitreous detachment; RD retinal detachment; DM diabetes mellitus; DR diabetic retinopathy; NPDR non-proliferative diabetic retinopathy; PDR proliferative diabetic retinopathy; CSME clinically significant macular edema; DME diabetic macular edema; dbh dot blot hemorrhages; CWS cotton wool spot; POAG primary open angle glaucoma; C/D cup-to-disc ratio; HVF humphrey visual field; GVF goldmann visual field; OCT optical coherence tomography; IOP intraocular pressure; BRVO Branch retinal vein occlusion; CRVO central  retinal vein occlusion; CRAO central retinal artery occlusion; BRAO branch retinal artery occlusion; RT retinal tear; SB  scleral buckle; PPV pars plana vitrectomy; VH Vitreous hemorrhage; PRP panretinal laser photocoagulation; IVK intravitreal kenalog; VMT vitreomacular traction; MH Macular hole;  NVD neovascularization of the disc; NVE neovascularization elsewhere; AREDS age related eye disease study; ARMD age related macular degeneration; POAG primary open angle glaucoma; EBMD epithelial/anterior basement membrane dystrophy; ACIOL anterior chamber intraocular lens; IOL intraocular lens; PCIOL posterior chamber intraocular lens; Phaco/IOL phacoemulsification with intraocular lens placement; [place] photorefractive keratectomy; LASIK laser assisted in situ keratomileusis; HTN hypertension; DM diabetes mellitus; COPD chronic obstructive pulmonary disease

## 2021-11-26 NOTE — Assessment & Plan Note (Signed)
Improved OS and stable 1 week after recent injection follow-up as scheduled

## 2021-11-26 NOTE — Assessment & Plan Note (Signed)
OD improved currently at 7-week follow-up.  Repeat injection today and follow-up next in 9

## 2021-11-26 NOTE — Assessment & Plan Note (Signed)
Compliant and improvement in macular condition and lengthening of interval follow-up coincident with successful use CPAP

## 2021-12-01 ENCOUNTER — Encounter (INDEPENDENT_AMBULATORY_CARE_PROVIDER_SITE_OTHER): Payer: HMO | Admitting: Ophthalmology

## 2022-01-14 DIAGNOSIS — E113313 Type 2 diabetes mellitus with moderate nonproliferative diabetic retinopathy with macular edema, bilateral: Secondary | ICD-10-CM | POA: Diagnosis not present

## 2022-01-14 DIAGNOSIS — H4323 Crystalline deposits in vitreous body, bilateral: Secondary | ICD-10-CM | POA: Diagnosis not present

## 2022-01-14 DIAGNOSIS — H401131 Primary open-angle glaucoma, bilateral, mild stage: Secondary | ICD-10-CM | POA: Diagnosis not present

## 2022-01-14 DIAGNOSIS — H02423 Myogenic ptosis of bilateral eyelids: Secondary | ICD-10-CM | POA: Diagnosis not present

## 2022-01-14 DIAGNOSIS — Z961 Presence of intraocular lens: Secondary | ICD-10-CM | POA: Diagnosis not present

## 2022-01-14 LAB — HM DIABETES EYE EXAM

## 2022-01-20 ENCOUNTER — Encounter: Payer: Self-pay | Admitting: Internal Medicine

## 2022-01-20 ENCOUNTER — Ambulatory Visit (INDEPENDENT_AMBULATORY_CARE_PROVIDER_SITE_OTHER): Payer: HMO | Admitting: Internal Medicine

## 2022-01-20 VITALS — BP 160/70 | HR 63 | Temp 98.0°F | Ht 61.0 in | Wt 156.0 lb

## 2022-01-20 DIAGNOSIS — E785 Hyperlipidemia, unspecified: Secondary | ICD-10-CM

## 2022-01-20 DIAGNOSIS — Z23 Encounter for immunization: Secondary | ICD-10-CM | POA: Diagnosis not present

## 2022-01-20 DIAGNOSIS — E1169 Type 2 diabetes mellitus with other specified complication: Secondary | ICD-10-CM

## 2022-01-20 DIAGNOSIS — I10 Essential (primary) hypertension: Secondary | ICD-10-CM

## 2022-01-20 LAB — POCT GLYCOSYLATED HEMOGLOBIN (HGB A1C): Hemoglobin A1C: 7.3 % — AB (ref 4.0–5.6)

## 2022-01-20 NOTE — Progress Notes (Signed)
   Subjective:   Patient ID: Whitney [name], female    DOB: 06-03-1934, 87 y.o.   MRN: [id number]  HPI The patient is an 87 YO female coming in for follow up. Did not take BP meds today.  Review of Systems  Constitutional: Negative.   HENT: Negative.    Eyes: Negative.   Respiratory:  Negative for cough, chest tightness and shortness of breath.   Cardiovascular:  Negative for chest pain, palpitations and leg swelling.  Gastrointestinal:  Negative for abdominal distention, abdominal pain, constipation, diarrhea, nausea and vomiting.  Musculoskeletal:  Positive for arthralgias.  Skin: Negative.   Neurological: Negative.   Psychiatric/Behavioral: Negative.      Objective:  Physical Exam Constitutional:      Appearance: She is well-developed.  HENT:     Head: Normocephalic and atraumatic.  Cardiovascular:     Rate and Rhythm: Normal rate and regular rhythm.  Pulmonary:     Effort: Pulmonary effort is normal. No respiratory distress.     Breath sounds: Normal breath sounds. No wheezing or rales.  Abdominal:     General: Bowel sounds are normal. There is no distension.     Palpations: Abdomen is soft.     Tenderness: There is no abdominal tenderness. There is no rebound.  Musculoskeletal:        General: Tenderness present.     Cervical back: Normal range of motion.  Skin:    General: Skin is warm and dry.  Neurological:     Mental Status: She is alert and oriented to person, place, and time.     Coordination: Coordination normal.     Vitals:   01/20/22 0912 01/20/22 0915  BP: (!) 160/80 (!) 160/70  Pulse: 63   Temp: 98 F (36.7 C)   TempSrc: Oral   SpO2: 98%   Weight: 156 lb (70.8 kg)   Height: '5\' 1"'$  (1.549 m)     Assessment & Plan:  Flu shot given at visit.

## 2022-01-20 NOTE — Assessment & Plan Note (Signed)
BP mildly elevated today without medications. She has previously been at goal consistently on meds. Will continue lisinopril/hctz 20/12.5 mg daily.

## 2022-01-20 NOTE — Patient Instructions (Addendum)
Call Dr. Zadie Rhine about the eye drops as they should be prescribing all the eye drops.   Go on the website for jardiance and look for patient assistance as they might pay for this for free if you qualify.  Claritin for the nose.

## 2022-01-20 NOTE — Assessment & Plan Note (Addendum)
POC HgA1c checked today at 7.3 which is at goal. She is now likely in doughnut hole jardiance is several hundred dollars. Advised she can check website for if she qualifies for patient assistance. If not let us know we can either go off for 2 months until January or switch for 2 months and then go back on. Counseled about how this happens and why as she was unaware. Stay on jardiance 25 mg daily, amaryl 4 mg daily and metformin 1000 mg BID. On ACE-I and statin.

## 2022-01-21 ENCOUNTER — Encounter: Payer: Self-pay | Admitting: *Deleted

## 2022-01-26 ENCOUNTER — Encounter (INDEPENDENT_AMBULATORY_CARE_PROVIDER_SITE_OTHER): Payer: HMO | Admitting: Ophthalmology

## 2022-01-27 ENCOUNTER — Other Ambulatory Visit (INDEPENDENT_AMBULATORY_CARE_PROVIDER_SITE_OTHER): Payer: HMO | Admitting: Ophthalmology

## 2022-01-27 MED ORDER — DORZOLAMIDE HCL-TIMOLOL MAL 2-0.5 % OP SOLN: OPHTHALMIC | 6 refills | Status: AC

## 2022-01-27 NOTE — Progress Notes (Signed)
Refilled patient med,  Topical COSOPT OU

## 2022-01-28 ENCOUNTER — Encounter (INDEPENDENT_AMBULATORY_CARE_PROVIDER_SITE_OTHER): Payer: HMO | Admitting: Ophthalmology

## 2022-02-18 LAB — HM DIABETES EYE EXAM

## 2022-02-23 NOTE — Progress Notes (Signed)
Retina and Diabetic [place]

## 2022-02-24 ENCOUNTER — Telehealth: Payer: HMO

## 2022-03-18 ENCOUNTER — Telehealth: Payer: Self-pay | Admitting: Internal Medicine

## 2022-03-18 NOTE — Telephone Encounter (Signed)
She does not need to check sugars ok to stop checking sugars. I don't do samples

## 2022-03-18 NOTE — Telephone Encounter (Signed)
Lattie Haw from [facility] called and said that Whitney [name] needs a new glucometer sent to the pharmacy - She also wanted to know if we have any samples of jardiance to get patient through January.  She is working with a case worker to help patient get the medication for 2024.  Please advise.  [place] [phone] (direct number)

## 2022-03-19 ENCOUNTER — Ambulatory Visit: Payer: HMO | Admitting: Podiatry

## 2022-03-19 DIAGNOSIS — M19071 Primary osteoarthritis, right ankle and foot: Secondary | ICD-10-CM | POA: Diagnosis not present

## 2022-03-19 DIAGNOSIS — M79671 Pain in right foot: Secondary | ICD-10-CM

## 2022-03-19 DIAGNOSIS — I739 Peripheral vascular disease, unspecified: Secondary | ICD-10-CM | POA: Diagnosis not present

## 2022-03-19 DIAGNOSIS — M779 Enthesopathy, unspecified: Secondary | ICD-10-CM

## 2022-03-19 NOTE — Progress Notes (Signed)
Subjective:   Patient ID: Whitney [name], female   DOB: 87 y.o.   MRN: [id number]   HPI Chief Complaint  Patient presents with   Foot Pain    Right foot arch pain and swelling,  started a year ago, rate of pain 8 out of 10,  Diabetic A1c-7.3 BG- 130, X-Rays done today    87 year old female presents for above concerns.  She states that the foot was swelling then the pain would go up to the knee. She gets pain when she gets up and walks. After walking it will ease up. No injuries. No numbness or tingling, more of a "hurt". She thought it was arthritis and she has tried diclenoc gel and it helps some for awhile then the pain comes back. If she is sitting still it is ok until she gets up.   Goes barefoot at home or just with a sock  Last A1c 7.3 on 01/20/2022  Review of Systems  All other systems reviewed and are negative.  Past Medical History:  Diagnosis Date   Anxiety    Baker's cyst    Chest pain    Diabetes mellitus type 2, uncontrolled    Diabetes mellitus without complication (HCC)    Disc disease, degenerative, cervical    Diverticulosis of colon    DJD (degenerative joint disease) of knee    Facial pain, atypical    GERD (gastroesophageal reflux disease)    Glaucoma    Hypercholesteremia    Hypertension    Lumbar spondylosis    Obesity    Onychomycosis    Osteopenia     Past Surgical History:  Procedure Laterality Date   right eye surgery  09/2010   retina in right eye   VAGINAL HYSTERECTOMY  2002   Dr. Kem Kays repair     Current Outpatient Medications:    Ascorbic Acid (VITAMIN C) 100 MG tablet, Take 100 mg by mouth daily., Disp: , Rfl:    aspirin 81 MG tablet, Take 1 tablet (81 mg total) by mouth daily., Disp: 90 tablet, Rfl: 1   blood glucose meter kit and supplies KIT, Use to test blood sugar up to 3 times daily. DX E11.9, Disp: 1 each, Rfl: 0   Calcium Carb-Cholecalciferol (CALCIUM-VITAMIN D) 500-200 MG-UNIT tablet, Take 1 tablet by mouth 2 (two)  times daily with a meal., Disp: 180 tablet, Rfl: 1   diclofenac Sodium (VOLTAREN) 1 % GEL, Apply 4 g topically 4 (four) times daily., Disp: 100 g, Rfl: 6   dorzolamide-timolol (COSOPT) 2-0.5 % ophthalmic solution, Instill 1 drop in each eye twice a day, Disp: 10 mL, Rfl: 6   DUREZOL 0.05 % EMUL, , Disp: , Rfl:    empagliflozin (JARDIANCE) 25 MG TABS tablet, Take 1 tablet (25 mg total) by mouth daily before breakfast., Disp: 90 tablet, Rfl: 3   glimepiride (AMARYL) 4 MG tablet, TAKE 1 TABLET EVERY DAY BEFORE BREAKFAST, Disp: 90 tablet, Rfl: 3   Lancet Devices (ACCU-CHEK SOFTCLIX) lancets, Use to test blood sugar up to 3 times a day. DX E11.9, Disp: 1 each, Rfl: 0   lisinopril-hydrochlorothiazide (ZESTORETIC) 20-12.5 MG tablet, Take 1 tablet by mouth daily., Disp: 90 tablet, Rfl: 3   metFORMIN (GLUCOPHAGE) 1000 MG tablet, Take 1 tablet (1,000 mg total) by mouth 2 (two) times daily with a meal., Disp: 180 tablet, Rfl: 3   Multiple Vitamin (MULTI VITAMIN DAILY PO), Take one by mouth daily, Disp: , Rfl:    ONETOUCH VERIO test strip, Use  to test blood sugar 3 times a day, Disp: 250 each, Rfl: 0   simvastatin (ZOCOR) 20 MG tablet, Take 1 tablet (20 mg total) by mouth at bedtime., Disp: 90 tablet, Rfl: 3   zinc gluconate 50 MG tablet, Take 50 mg by mouth daily., Disp: , Rfl:   No Known Allergies         Objective:  Physical Exam  General: AAO x3, NAD  Dermatological: Skin is warm, dry and supple bilateral. There are no open sores, no preulcerative lesions, no rash or signs of infection present.  Vascular: DP pulses 2/4, PT pulse 1/4 bilaterally.  CRT less than 3 seconds.  There is no pain with calf compression, swelling, warmth, erythema.   Neruologic: Grossly intact via light touch bilateral.  Negative Tinel sign.  Musculoskeletal: Decreased mid arch upon weightbearing.  She does get tenderness on the arch of the foot along the open about her fascia as well as the dorsal aspect of the  midfoot.  She felt that she was getting discomfort on the course of the peroneal tendon today.  Clinically the flexor, extensor tendons are intact.  Muscular strength 5/5 in all groups tested bilateral.  Gait: Unassisted, Nonantalgic.       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  Multiple views right foot were obtained.  No evidence of acute fracture.  Decreased calcaneal inclination angle.  Spurring present on significant joint noted on the lateral view. -I think her symptoms are combination of some arthritis, flatfoot as well as tendinitis issues.  We discussed shoes and good arch support.  Discussed different medications as well including Voltaren gel, icing.  Discussed stretching exercises to help rehab.  Also advised not going barefoot around the house and wear shoes with good support. -ABI ordered to make sure no underlying vascular disease causing issues.  Trula Slade DPM     -ABI -Stretch/volt

## 2022-03-19 NOTE — Patient Instructions (Addendum)
Plantar Fasciitis (Heel Spur Syndrome) with Rehab The plantar fascia is a fibrous, ligament-like, soft-tissue structure that spans the bottom of the foot. Plantar fasciitis is a condition that causes pain in the foot due to inflammation of the tissue. SYMPTOMS   Pain and tenderness on the underneath side of the foot.  Pain that worsens with standing or walking. CAUSES  Plantar fasciitis is caused by irritation and injury to the plantar fascia on the underneath side of the foot. Common mechanisms of injury include:  Direct trauma to bottom of the foot.  Damage to a small nerve that runs under the foot where the main fascia attaches to the heel bone.  Stress placed on the plantar fascia due to bone spurs. RISK INCREASES WITH:   Activities that place stress on the plantar fascia (running, jumping, pivoting, or cutting).  Poor strength and flexibility.  Improperly fitted shoes.  Tight calf muscles.  Flat feet.  Failure to warm-up properly before activity.  Obesity. PREVENTION  Warm up and stretch properly before activity.  Allow for adequate recovery between workouts.  Maintain physical fitness:  Strength, flexibility, and endurance.  Cardiovascular fitness.  Maintain a health body weight.  Avoid stress on the plantar fascia.  Wear properly fitted shoes, including arch supports for individuals who have flat feet.  PROGNOSIS  If treated properly, then the symptoms of plantar fasciitis usually resolve without surgery. However, occasionally surgery is necessary.  RELATED COMPLICATIONS   Recurrent symptoms that may result in a chronic condition.  Problems of the lower back that are caused by compensating for the injury, such as limping.  Pain or weakness of the foot during push-off following surgery.  Chronic inflammation, scarring, and partial or complete fascia tear, occurring more often from repeated injections.  TREATMENT  Treatment initially involves the  use of ice and medication to help reduce pain and inflammation. The use of strengthening and stretching exercises may help reduce pain with activity, especially stretches of the Achilles tendon. These exercises may be performed at home or with a therapist. Your caregiver may recommend that you use heel cups of arch supports to help reduce stress on the plantar fascia. Occasionally, corticosteroid injections are given to reduce inflammation. If symptoms persist for greater than 6 months despite non-surgical (conservative), then surgery may be recommended.   MEDICATION   If pain medication is necessary, then nonsteroidal anti-inflammatory medications, such as aspirin and ibuprofen, or other minor pain relievers, such as acetaminophen, are often recommended.  Do not take pain medication within 7 days before surgery.  Prescription pain relievers may be given if deemed necessary by your caregiver. Use only as directed and only as much as you need.  Corticosteroid injections may be given by your caregiver. These injections should be reserved for the most serious cases, because they may only be given a certain number of times.  HEAT AND COLD  Cold treatment (icing) relieves pain and reduces inflammation. Cold treatment should be applied for 10 to 15 minutes every 2 to 3 hours for inflammation and pain and immediately after any activity that aggravates your symptoms. Use ice packs or massage the area with a piece of ice (ice massage).  Heat treatment may be used prior to performing the stretching and strengthening activities prescribed by your caregiver, physical therapist, or athletic trainer. Use a heat pack or soak the injury in warm water.  SEEK IMMEDIATE MEDICAL CARE IF:  Treatment seems to offer no benefit, or the condition worsens.  Any medications   produce adverse side effects.  EXERCISES- RANGE OF MOTION (ROM) AND STRETCHING EXERCISES - Plantar Fasciitis (Heel Spur Syndrome) These exercises  may help you when beginning to rehabilitate your injury. Your symptoms may resolve with or without further involvement from your physician, physical therapist or athletic trainer. While completing these exercises, remember:   Restoring tissue flexibility helps normal motion to return to the joints. This allows healthier, less painful movement and activity.  An effective stretch should be held for at least 30 seconds.  A stretch should never be painful. You should only feel a gentle lengthening or release in the stretched tissue.  RANGE OF MOTION - Toe Extension, Flexion  Sit with your right / left leg crossed over your opposite knee.  Grasp your toes and gently pull them back toward the top of your foot. You should feel a stretch on the bottom of your toes and/or foot.  Hold this stretch for 10 seconds.  Now, gently pull your toes toward the bottom of your foot. You should feel a stretch on the top of your toes and or foot.  Hold this stretch for 10 seconds. Repeat  times. Complete this stretch 3 times per day.   RANGE OF MOTION - Ankle Dorsiflexion, Active Assisted  Remove shoes and sit on a chair that is preferably not on a carpeted surface.  Place right / left foot under knee. Extend your opposite leg for support.  Keeping your heel down, slide your right / left foot back toward the chair until you feel a stretch at your ankle or calf. If you do not feel a stretch, slide your bottom forward to the edge of the chair, while still keeping your heel down.  Hold this stretch for 10 seconds. Repeat 3 times. Complete this stretch 2 times per day.   STRETCH  Gastroc, Standing  Place hands on wall.  Extend right / left leg, keeping the front knee somewhat bent.  Slightly point your toes inward on your back foot.  Keeping your right / left heel on the floor and your knee straight, shift your weight toward the wall, not allowing your back to arch.  You should feel a gentle stretch  in the right / left calf. Hold this position for 10 seconds. Repeat 3 times. Complete this stretch 2 times per day.  STRETCH  Soleus, Standing  Place hands on wall.  Extend right / left leg, keeping the other knee somewhat bent.  Slightly point your toes inward on your back foot.  Keep your right / left heel on the floor, bend your back knee, and slightly shift your weight over the back leg so that you feel a gentle stretch deep in your back calf.  Hold this position for 10 seconds. Repeat 3 times. Complete this stretch 2 times per day.  STRETCH  Gastrocsoleus, Standing  Note: This exercise can place a lot of stress on your foot and ankle. Please complete this exercise only if specifically instructed by your caregiver.   Place the ball of your right / left foot on a step, keeping your other foot firmly on the same step.  Hold on to the wall or a rail for balance.  Slowly lift your other foot, allowing your body weight to press your heel down over the edge of the step.  You should feel a stretch in your right / left calf.  Hold this position for 10 seconds.  Repeat this exercise with a slight bend in your right /   gain both the endurance and the strength needed for everyday activities through controlled exercises. Complete these exercises as instructed by your physician, physical therapist or athletic trainer. Progress the resistance and repetitions only as guided.  STRENGTH - Towel Curls Sit in a chair positioned on a non-carpeted surface. Place your foot on a towel, keeping your heel on the floor. Pull the towel toward your heel by only curling your toes. Keep your heel on the floor. Repeat 3 times.  Complete this exercise 2 times per day.  STRENGTH - Ankle Inversion Secure one end of a rubber exercise band/tubing to a fixed object (table, pole). Loop the other end around your foot just before your toes. Place your fists between your knees. This will focus your strengthening at your ankle. Slowly, pull your big toe up and in, making sure the band/tubing is positioned to resist the entire motion. Hold this position for 10 seconds. Have your muscles resist the band/tubing as it slowly pulls your foot back to the starting position. Repeat 3 times. Complete this exercises 2 times per day.  Document Released: 03/29/2005 Document Revised: 06/21/2011 Document Reviewed: 07/11/2008 [facility] Patient Information 2014 [place], [place].  Diabetes Mellitus and Foot Care Diabetes, also called diabetes mellitus, may cause problems with your feet and legs because of poor blood flow (circulation). Poor circulation may make your skin: Become thinner and drier. Break more easily. Heal more slowly. Peel and crack. You may also have nerve damage (neuropathy). This can cause decreased feeling in your legs and feet. This means that you may not notice minor injuries to your feet that could lead to more serious problems. Finding and treating problems early is the best way to prevent future foot problems. How to care for your feet Foot hygiene  Wash your feet daily with warm water and mild soap. Do not use hot water. Then, pat your feet and the areas between your toes until they are fully dry. Do not soak your feet. This can dry your skin. Trim your toenails straight across. Do not dig under them or around the cuticle. File the edges of your nails with an emery board or nail file. Apply a moisturizing lotion or petroleum jelly to the skin on your feet and to dry, brittle toenails. Use lotion that does not contain alcohol and is unscented. Do not apply lotion between your toes. Shoes and socks Wear clean socks or  stockings every day. Make sure they are not too tight. Do not wear knee-high stockings. These may decrease blood flow to your legs. Wear shoes that fit well and have enough cushioning. Always look in your shoes before you put them on to be sure there are no objects inside. To break in new shoes, wear them for just a few hours a day. This prevents injuries on your feet. Wounds, scrapes, corns, and calluses  Check your feet daily for blisters, cuts, bruises, sores, and redness. If you cannot see the bottom of your feet, use a mirror or ask someone for help. Do not cut off corns or calluses or try to remove them with medicine. If you find a minor scrape, cut, or break in the skin on your feet, keep it and the skin around it clean and dry. You may clean these areas with mild soap and water. Do not clean the area with peroxide, alcohol, or iodine. If you have a wound, scrape, corn, or callus on your foot, look at it several times a day to make sure it  is healing and not infected. Check for: Redness, swelling, or pain. Fluid or blood. Warmth. Pus or a bad smell. General tips Do not cross your legs. This may decrease blood flow to your feet. Do not use heating pads or hot water bottles on your feet. They may burn your skin. If you have lost feeling in your feet or legs, you may not know this is happening until it is too late. Protect your feet from hot and cold by wearing shoes, such as at the beach or on hot pavement. Schedule a complete foot exam at least once a year or more often if you have foot problems. Report any cuts, sores, or bruises to your health care provider right away. Where to find more information American Diabetes Association: diabetes.org Association of Diabetes Care & Education Specialists: diabeteseducator.org Contact a health care provider if: You have a condition that increases your risk of infection, and you have any cuts, sores, or bruises on your feet. You have an injury  that is not healing. You have redness on your legs or feet. You feel burning or tingling in your legs or feet. You have pain or cramps in your legs and feet. Your legs or feet are numb. Your feet always feel cold. You have pain around any toenails. Get help right away if: You have a wound, scrape, corn, or callus on your foot and: You have signs of infection. You have a fever. You have a red line going up your leg. This information is not intended to replace advice given to you by your health care provider. Make sure you discuss any questions you have with your health care provider. Document Revised: 09/30/2021 Document Reviewed: 09/30/2021 Elsevier Patient Education  [place].

## 2022-03-22 NOTE — Telephone Encounter (Signed)
Contacted patient and informed her about not having samples for the jardiance and she can stop checking her sugar levels

## 2022-03-29 ENCOUNTER — Other Ambulatory Visit: Payer: Self-pay | Admitting: Internal Medicine

## 2022-03-29 DIAGNOSIS — Z1231 Encounter for screening mammogram for malignant neoplasm of breast: Secondary | ICD-10-CM

## 2022-03-31 ENCOUNTER — Other Ambulatory Visit: Payer: Self-pay | Admitting: Podiatry

## 2022-03-31 DIAGNOSIS — I739 Peripheral vascular disease, unspecified: Secondary | ICD-10-CM

## 2022-04-01 ENCOUNTER — Ambulatory Visit (HOSPITAL_COMMUNITY)
Admission: RE | Admit: 2022-04-01 | Discharge: 2022-04-01 | Disposition: A | Discharge status: Home / Self Care | Payer: HMO | Source: Ambulatory Visit | Attending: Internal Medicine | Admitting: Internal Medicine

## 2022-04-01 DIAGNOSIS — I739 Peripheral vascular disease, unspecified: Secondary | ICD-10-CM | POA: Insufficient documentation

## 2022-04-07 ENCOUNTER — Encounter: Payer: Self-pay | Admitting: Internal Medicine

## 2022-04-14 ENCOUNTER — Telehealth: Payer: Self-pay

## 2022-04-14 NOTE — Telephone Encounter (Signed)
Left voicemail for patient to return phone call 

## 2022-04-15 ENCOUNTER — Telehealth: Payer: Self-pay | Admitting: Internal Medicine

## 2022-04-15 NOTE — Telephone Encounter (Signed)
Left message for patient to call back to schedule Medicare Annual Wellness Visit   Last AWV  03/17/20  Please schedule at anytime with [facility] if patient calls the office back.     Any questions, please call me at [phone]

## 2022-04-16 ENCOUNTER — Encounter: Payer: Self-pay | Admitting: Internal Medicine

## 2022-04-16 NOTE — Telephone Encounter (Signed)
Spoke with PT and daughter and let them know that we will be calling them back in a bit. They stated for best response time to call the daughter's mobile number.  CB: [phone]

## 2022-04-19 NOTE — Telephone Encounter (Signed)
I have spoke with patient last week and informed her that her forms were faxed back in.

## 2022-04-20 ENCOUNTER — Telehealth: Payer: Self-pay | Admitting: Internal Medicine

## 2022-04-20 NOTE — Telephone Encounter (Signed)
Left message for patient to call back to schedule Medicare Annual Wellness Visit   Last AWV  03/17/20  Please schedule at anytime with [facility] if patient calls the office back.      Any questions, please call me at [phone]

## 2022-04-21 DIAGNOSIS — H401121 Primary open-angle glaucoma, left eye, mild stage: Secondary | ICD-10-CM | POA: Diagnosis not present

## 2022-04-21 DIAGNOSIS — E113312 Type 2 diabetes mellitus with moderate nonproliferative diabetic retinopathy with macular edema, left eye: Secondary | ICD-10-CM | POA: Diagnosis not present

## 2022-04-21 DIAGNOSIS — H35072 Retinal telangiectasis, left eye: Secondary | ICD-10-CM | POA: Diagnosis not present

## 2022-04-21 DIAGNOSIS — E113311 Type 2 diabetes mellitus with moderate nonproliferative diabetic retinopathy with macular edema, right eye: Secondary | ICD-10-CM | POA: Diagnosis not present

## 2022-04-27 ENCOUNTER — Ambulatory Visit (INDEPENDENT_AMBULATORY_CARE_PROVIDER_SITE_OTHER): Payer: PPO | Admitting: Internal Medicine

## 2022-04-27 ENCOUNTER — Encounter: Payer: Self-pay | Admitting: Internal Medicine

## 2022-04-27 ENCOUNTER — Ambulatory Visit: Payer: HMO | Admitting: Internal Medicine

## 2022-04-27 ENCOUNTER — Ambulatory Visit (INDEPENDENT_AMBULATORY_CARE_PROVIDER_SITE_OTHER): Payer: PPO

## 2022-04-27 VITALS — BP 160/98 | HR 76 | Temp 97.9°F | Ht 61.0 in | Wt 144.0 lb

## 2022-04-27 DIAGNOSIS — H903 Sensorineural hearing loss, bilateral: Secondary | ICD-10-CM

## 2022-04-27 DIAGNOSIS — R413 Other amnesia: Secondary | ICD-10-CM | POA: Diagnosis not present

## 2022-04-27 DIAGNOSIS — M25551 Pain in right hip: Secondary | ICD-10-CM | POA: Diagnosis not present

## 2022-04-27 DIAGNOSIS — M79604 Pain in right leg: Secondary | ICD-10-CM | POA: Insufficient documentation

## 2022-04-27 DIAGNOSIS — M25561 Pain in right knee: Secondary | ICD-10-CM | POA: Diagnosis not present

## 2022-04-27 MED ORDER — PREDNISONE 20 MG PO TABS: 40.0000 mg | ORAL_TABLET | Freq: Every day | ORAL | 0 refills | Status: DC

## 2022-04-27 MED ORDER — DONEPEZIL HCL 5 MG PO TABS: 5.0000 mg | ORAL_TABLET | Freq: Every day | ORAL | 1 refills | Status: DC

## 2022-04-27 NOTE — Assessment & Plan Note (Signed)
Reviewed MRI from 2022 with her. This did not show a clear cause of memory concerns but this does not rule out early alzheimer's. Patient relates to me problems with short term memory misplacing things and sometimes names of things do not come out. Rx aricept 5 mg daily to start and if doing well will increase to 10 mg daily in 3 months. It is hard to tell without 3rd party but seems to fit in mild cognitive impairment.

## 2022-04-27 NOTE — Assessment & Plan Note (Signed)
Hearing is worsening and she has lost her hearing aids (due to catching on a mask and getting lost). Refer to audiology for re-testing and potentially hearing aids.

## 2022-04-27 NOTE — Patient Instructions (Signed)
We have sent in aricept for the memory to take 1 pill daily. This may cause diarrhea which is usually mild and usually stops in 1-2 weeks. If not let us know.  We have sent in prednisone to take 2 pills daily for 5 days to help the leg.  We are checking the x-ray today.

## 2022-04-27 NOTE — Progress Notes (Signed)
   Subjective:   Patient ID: Whitney [name], female    DOB: 04/13/1934, 88 y.o.   MRN: [id number]  HPI The patient is an 88 YO female coming in for several concerns. Daughter had sent mychart message with some concerns as well.   Review of Systems  Constitutional:  Positive for activity change.  HENT:  Positive for hearing loss.   Eyes: Negative.   Respiratory:  Negative for cough, chest tightness and shortness of breath.   Cardiovascular:  Negative for chest pain, palpitations and leg swelling.  Gastrointestinal:  Negative for abdominal distention, abdominal pain, constipation, diarrhea, nausea and vomiting.  Musculoskeletal:  Positive for arthralgias, gait problem and myalgias.  Skin: Negative.   Neurological:        Memory change  Psychiatric/Behavioral: Negative.      Objective:  Physical Exam Constitutional:      Appearance: She is well-developed.  HENT:     Head: Normocephalic and atraumatic.  Cardiovascular:     Rate and Rhythm: Normal rate and regular rhythm.  Pulmonary:     Effort: Pulmonary effort is normal. No respiratory distress.     Breath sounds: Normal breath sounds. No wheezing or rales.  Abdominal:     General: Bowel sounds are normal. There is no distension.     Palpations: Abdomen is soft.     Tenderness: There is no abdominal tenderness. There is no rebound.  Musculoskeletal:        General: Tenderness present.     Cervical back: Normal range of motion.     Comments: Pain mostly right knee to exam, mild swelling  Skin:    General: Skin is warm and dry.  Neurological:     Mental Status: She is alert and oriented to person, place, and time.     Coordination: Coordination abnormal.     Vitals:   04/27/22 0902 04/27/22 0908  BP: (!) 160/98 (!) 160/98  Pulse: 76   Temp: 97.9 F (36.6 C)   TempSrc: Oral   SpO2: 98%   Weight: 144 lb (65.3 kg)   Height: '5\' 1"'$  (1.549 m)     Assessment & Plan:  Visit time 25 minutes in face to face  communication with patient and coordination of care, additional 5 minutes spent in record review, coordination or care, ordering tests, communicating/referring to other healthcare professionals, documenting in medical records all on the same day of the visit for total time 30 minutes spent on the visit.

## 2022-04-27 NOTE — Assessment & Plan Note (Signed)
Suspect related to knee arthritis. No clear trigger for recent severe pain episode around christmas. Rx prednisone 5 day course to help. X-ray ordered of knee and hip to assess for burden of arthritis. Using tylenol for pain.

## 2022-05-03 ENCOUNTER — Other Ambulatory Visit: Payer: Self-pay | Admitting: Internal Medicine

## 2022-05-03 DIAGNOSIS — M79604 Pain in right leg: Secondary | ICD-10-CM

## 2022-05-06 DIAGNOSIS — H35071 Retinal telangiectasis, right eye: Secondary | ICD-10-CM | POA: Diagnosis not present

## 2022-05-06 DIAGNOSIS — E113312 Type 2 diabetes mellitus with moderate nonproliferative diabetic retinopathy with macular edema, left eye: Secondary | ICD-10-CM | POA: Diagnosis not present

## 2022-05-06 DIAGNOSIS — H401121 Primary open-angle glaucoma, left eye, mild stage: Secondary | ICD-10-CM | POA: Diagnosis not present

## 2022-05-06 DIAGNOSIS — E113311 Type 2 diabetes mellitus with moderate nonproliferative diabetic retinopathy with macular edema, right eye: Secondary | ICD-10-CM | POA: Diagnosis not present

## 2022-05-07 ENCOUNTER — Ambulatory Visit: Payer: PPO | Attending: Audiologist | Admitting: Audiologist

## 2022-05-07 DIAGNOSIS — H903 Sensorineural hearing loss, bilateral: Secondary | ICD-10-CM | POA: Diagnosis not present

## 2022-05-07 NOTE — Procedures (Signed)
  Outpatient [facility] and [place],   [id number] [phone]  AUDIOLOGICAL  EVALUATION  NAME: Whitney [name]     DOB:   [birth year]      MRN: [id number]                                                                                     DATE: 05/07/2022     REFERENT: [name], MD STATUS: Outpatient DIAGNOSIS: Sensorineural Hearing Loss Bilateral    History: Whitney [name] was seen for an audiological evaluation. Whitney [name] is receiving a hearing evaluation due to concerns for difficulty understanding people. Whitney [name] had hearing aids from [place] when she lived in [place]. She lost them when taking her mask on and off.  Whitney [name] has difficulty hearing in both ears. This difficulty began gradually.  No pain or pressure reported in either ear. Tinnitus denied in both ears. [name] has a history of noise exposure from working in a [employer] for many years.  Medical history positive for diabetes which is a risk factor for hearing loss. No other relevant case history reported.   Evaluation:  Otoscopy showed a clear view of the tympanic membranes, bilaterally Tympanometry results were consistent with normal middle ear function, bilaterally   Audiometric testing was completed using conventional audiometry with insert transducer. Speech Recognition Thresholds were 20dB in the right ear and 25dB in the left ear. Word Recognition was performed  40dB SL, scored  100% in the right ear and 84% in the left ear. Pure tone thresholds show normal hearing 250-1.5kHz sloping to moderately severe sensorineural hearing loss in each ear  Results:  The test results were reviewed with Whitney [name]. She has a sloping high pitched sensorineural hearing loss. This is due to age and noise exposure. I do recommend she purchase hearing aids again. She was given a packet of information including two copies of her audiogram.   Recommendations:  Amplification is necessary for  both ears. Hearing aids can be purchased from a variety of locations. See provided list for locations in the [place] area.    23 minutes spent testing and counseling on results.   Whitney [name]  Audiologist, Au.D., CCC-A 05/07/2022  10:15 AM  Cc: [name], MD

## 2022-05-10 ENCOUNTER — Ambulatory Visit (INDEPENDENT_AMBULATORY_CARE_PROVIDER_SITE_OTHER): Payer: PPO

## 2022-05-10 ENCOUNTER — Ambulatory Visit: Payer: Self-pay

## 2022-05-10 ENCOUNTER — Ambulatory Visit (INDEPENDENT_AMBULATORY_CARE_PROVIDER_SITE_OTHER): Payer: PPO | Admitting: Family Medicine

## 2022-05-10 VITALS — BP 182/78 | HR 73 | Ht 61.0 in | Wt 142.0 lb

## 2022-05-10 DIAGNOSIS — M545 Low back pain, unspecified: Secondary | ICD-10-CM | POA: Diagnosis not present

## 2022-05-10 DIAGNOSIS — M25561 Pain in right knee: Secondary | ICD-10-CM | POA: Diagnosis not present

## 2022-05-10 DIAGNOSIS — M79604 Pain in right leg: Secondary | ICD-10-CM

## 2022-05-10 DIAGNOSIS — G8929 Other chronic pain: Secondary | ICD-10-CM

## 2022-05-10 DIAGNOSIS — M5416 Radiculopathy, lumbar region: Secondary | ICD-10-CM

## 2022-05-10 DIAGNOSIS — M47816 Spondylosis without myelopathy or radiculopathy, lumbar region: Secondary | ICD-10-CM | POA: Diagnosis not present

## 2022-05-10 NOTE — Patient Instructions (Signed)
Thank you for coming in today.   Please get an Xray today before you leave   If this comes back let me know and I will do more including a knee injection and maybe looking at the back for a pinched nerve.

## 2022-05-10 NOTE — Progress Notes (Signed)
   I, Peterson Lombard, LAT, ATC acting as a scribe for Lynne Leader, MD.  Subjective:    CC: Right leg pain  HPI: Patient is an 88 year old female presenting with right leg pain ongoing since around Christmas. No trauma or falls. Pt notes pain has greatly improved and she is almost 100% better.  Patient locates pain to all over the R knee into her R lower leg.  Dr. Sharlet Salina prescribed prednisone for about a week which worked extremely well to resolve the pain radiating down her leg and her knee pain.  Pain is radiating from the back to the lateral thigh past the knee to the lateral calf and she had pain isolated in the anterior knee extending to the medial and lateral knee.  Low back pain: no- has resolved R Knee swelling: yes Treatments tried: Prednisone, Tylenol  Dx imaging: 04/27/2022 right knee & right hip x-ray  04/10/22 Vasc US  Pertinent review of Systems: No fevers or chills  Relevant historical information: Hypertension.  Diabetes.  Diabetic retinopathy.   Objective:    Vitals:   05/10/22 1259  BP: (!) 182/78  Pulse: 73  SpO2: 92%   General: Well Developed, well nourished, and in no acute distress.   MSK: L-spine: Normal appearing. Nontender to palpation spinal midline.  Normal lumbar motion. Lower extremity strength equal and intact bilaterally. Reflexes and sensation are intact.  Right knee: Normal-appearing nontender to palpation normal motion with crepitation.  Lab and Radiology Results  X-ray images lumbar spine obtained today personally and independently interpreted DDD L4-5 L5-S1.  No acute fractures are visible. Await formal radiology review   Impression and Recommendations:    Assessment and Plan: 88 y.o. female with right leg pain.  I think her pain was multifactorial.  I think it is very likely that she had right L5 lumbar radiculopathy and some right knee pain due to DJD and chondrocalcinosis possibly pseudogout.  Regardless her pain now is better  after short course of oral prednisone prescribed by her primary care provider.  Plan for watch for watchful waiting.  Additionally will obtain x-ray lumbar spine.  If the pain should return she will return to clinic.  We could prescribe prednisone again or do an interarticular knee injection and proceed to lumbar spine MRI if needed.Marland Kitchen  PDMP not reviewed this encounter. Orders Placed This Encounter  Procedures   DG Lumbar Spine 2-3 Views    Standing Status:   Future    Number of Occurrences:   1    Standing Expiration Date:   05/11/2023    Order Specific Question:   Reason for Exam (SYMPTOM  OR DIAGNOSIS REQUIRED)    Answer:   eval lumbar rad rt    Order Specific Question:   Preferred imaging location?    Answer:   [facility]   No orders of the defined types were placed in this encounter.   Discussed warning signs or symptoms. Please see discharge instructions. Patient expresses understanding.   The above documentation has been reviewed and is accurate and complete Lynne Leader, M.D.

## 2022-05-11 NOTE — Progress Notes (Signed)
Lumbar spine x-ray shows arthritis.

## 2022-05-24 ENCOUNTER — Ambulatory Visit
Admission: RE | Admit: 2022-05-24 | Discharge: 2022-05-24 | Disposition: A | Discharge status: Home / Self Care | Payer: PPO | Source: Ambulatory Visit | Attending: Internal Medicine | Admitting: Internal Medicine

## 2022-05-24 DIAGNOSIS — Z1231 Encounter for screening mammogram for malignant neoplasm of breast: Secondary | ICD-10-CM | POA: Diagnosis not present

## 2022-05-26 DIAGNOSIS — H35073 Retinal telangiectasis, bilateral: Secondary | ICD-10-CM | POA: Diagnosis not present

## 2022-05-26 DIAGNOSIS — E113311 Type 2 diabetes mellitus with moderate nonproliferative diabetic retinopathy with macular edema, right eye: Secondary | ICD-10-CM | POA: Diagnosis not present

## 2022-05-26 DIAGNOSIS — E113312 Type 2 diabetes mellitus with moderate nonproliferative diabetic retinopathy with macular edema, left eye: Secondary | ICD-10-CM | POA: Diagnosis not present

## 2022-05-26 DIAGNOSIS — H401131 Primary open-angle glaucoma, bilateral, mild stage: Secondary | ICD-10-CM | POA: Diagnosis not present

## 2022-05-28 ENCOUNTER — Telehealth: Payer: Self-pay

## 2022-05-28 ENCOUNTER — Encounter: Payer: Self-pay | Admitting: Internal Medicine

## 2022-05-28 LAB — HM MAMMOGRAPHY

## 2022-05-31 NOTE — Telephone Encounter (Signed)
I would like to see her back in my clinic for that leg swelling.  I think it might be time for an injection in the knee perhaps.

## 2022-05-31 NOTE — Telephone Encounter (Signed)
She just saw Dr. Georgina Snell on 05/10/22 did she mean to call them about this?

## 2022-05-31 NOTE — Telephone Encounter (Signed)
Whitney [name],  Will you please reach out to pt to assist with scheduling?  Thanks!

## 2022-06-01 NOTE — Telephone Encounter (Signed)
Appointment scheduled with Dr Georgina Snell.

## 2022-06-02 NOTE — Progress Notes (Unsigned)
I, Peterson Lombard, LAT, ATC acting as a scribe for Lynne Leader, MD.  Whitney [name] is a 88 y.o. female who presents to [facility] today for R knee and leg pain. Pt was last seen by Dr. Georgina Snell on 05/10/22 for this issue, but pain had resolved after a course of prednisone. Today, pt reports she just noticed a bump on the medial aspect of her R knee. R knee is not painful, only "uncomfortable." Slight swelling present in her R LE.   Dx imaging: 05/10/22 L-spine XR 04/27/2022 right knee & right hip x-ray             04/10/22 Vasc US  Pertinent review of systems: No fevers or chills  Relevant historical information: Hypertension and diabetes.   Exam:  BP (!) 150/74   Pulse 78   Ht 5' 1"$  (1.549 m)   Wt 141 lb (64 kg)   SpO2 97%   BMI 26.64 kg/m  General: Well Developed, well nourished, and in no acute distress.   MSK: Right knee: Moderate effusion normal motion with crepitation.  Tender palpation medial joint line.    Lab and Radiology Results  Procedure: Real-time Ultrasound Guided Injection of right knee superior lateral patellar space Device: Philips Affiniti 50G Images permanently stored and available for review in PACS Verbal informed consent obtained.  Discussed risks and benefits of procedure. Warned about infection, bleeding, hyperglycemia damage to structures among others. Patient expresses understanding and agreement Time-out conducted.   Noted no overlying erythema, induration, or other signs of local infection.   Skin prepped in a sterile fashion.   Local anesthesia: Topical Ethyl chloride.   With sterile technique and under real time ultrasound guidance: 40 mg of Kenalog and 2 mL Marcaine injected into knee joint. Fluid seen entering the joint capsule.   Completed without difficulty   Pain immediately resolved suggesting accurate placement of the medication.   Advised to call if fevers/chills, erythema, induration, drainage, or  persistent bleeding.   Images permanently stored and available for review in the ultrasound unit.  Impression: Technically successful ultrasound guided injection.   EXAM: RIGHT KNEE - COMPLETE 4+ VIEW   COMPARISON:  None Available.   FINDINGS: There is diffuse decreased bone mineralization. Mild lateral compartment joint space narrowing and peripheral osteophytosis. Mild peripheral medial compartment degenerative osteophytes.Mild peripheral patellar degenerative osteophytes. Small joint effusion. Moderate lateral and mild medial compartment chondrocalcinosis.   No definite acute fracture is seen. No dislocation. Moderate vascular calcifications.   IMPRESSION: 1. Mild to moderate lateral and mild patellofemoral and medial compartment osteoarthritis. 2. Moderate lateral and mild medial compartment chondrocalcinosis. 3. Small joint effusion.     Electronically Signed   By: Yvonne Kendall M.D.   On: 04/27/2022 10:36 I, Lynne Leader, personally (independently) visualized and performed the interpretation of the images attached in this note.       Assessment and Plan: 88 y.o. female with right knee pain and effusion.  Pain thought to be due to exacerbation of DJD.  Plan for steroid injection today.  Check back as needed.  We talked about future treatment plan and options. We talked about medication options.  Given her hypertension and diabetes avoid high-dose oral NSAIDs. Chronic problem with acute exacerbation.  Uncertain prognosis.  PDMP not reviewed this encounter. Orders Placed This Encounter  Procedures   [place] LIMITED JOINT SPACE STRUCTURES LOW RIGHT(NO LINKED CHARGES)    Order Specific Question:   Reason for Exam (SYMPTOM  OR DIAGNOSIS REQUIRED)    Answer:   right knee pain    Order Specific Question:   Preferred imaging location?    Answer:   [place]   No orders of the defined types were placed in this encounter.    Discussed warning  signs or symptoms. Please see discharge instructions. Patient expresses understanding.   The above documentation has been reviewed and is accurate and complete Lynne Leader, M.D.

## 2022-06-03 ENCOUNTER — Ambulatory Visit (INDEPENDENT_AMBULATORY_CARE_PROVIDER_SITE_OTHER): Payer: PPO | Admitting: Family Medicine

## 2022-06-03 ENCOUNTER — Ambulatory Visit: Payer: Self-pay

## 2022-06-03 VITALS — BP 150/74 | HR 78 | Ht 61.0 in | Wt 141.0 lb

## 2022-06-03 DIAGNOSIS — M1711 Unilateral primary osteoarthritis, right knee: Secondary | ICD-10-CM

## 2022-06-03 DIAGNOSIS — M25561 Pain in right knee: Secondary | ICD-10-CM | POA: Diagnosis not present

## 2022-06-03 DIAGNOSIS — G8929 Other chronic pain: Secondary | ICD-10-CM

## 2022-06-03 NOTE — Patient Instructions (Addendum)
Thank you for coming in today.   You received an injection today. Seek immediate medical attention if the joint becomes red, extremely painful, or is oozing fluid.   Recheck as needed.   Keep me updated.

## 2022-06-17 DIAGNOSIS — H401121 Primary open-angle glaucoma, left eye, mild stage: Secondary | ICD-10-CM | POA: Diagnosis not present

## 2022-06-17 DIAGNOSIS — E113311 Type 2 diabetes mellitus with moderate nonproliferative diabetic retinopathy with macular edema, right eye: Secondary | ICD-10-CM | POA: Diagnosis not present

## 2022-06-17 DIAGNOSIS — E113312 Type 2 diabetes mellitus with moderate nonproliferative diabetic retinopathy with macular edema, left eye: Secondary | ICD-10-CM | POA: Diagnosis not present

## 2022-06-17 DIAGNOSIS — H35073 Retinal telangiectasis, bilateral: Secondary | ICD-10-CM | POA: Diagnosis not present

## 2022-06-17 DIAGNOSIS — H401131 Primary open-angle glaucoma, bilateral, mild stage: Secondary | ICD-10-CM | POA: Diagnosis not present

## 2022-07-07 DIAGNOSIS — H401121 Primary open-angle glaucoma, left eye, mild stage: Secondary | ICD-10-CM | POA: Diagnosis not present

## 2022-07-07 DIAGNOSIS — E113311 Type 2 diabetes mellitus with moderate nonproliferative diabetic retinopathy with macular edema, right eye: Secondary | ICD-10-CM | POA: Diagnosis not present

## 2022-07-07 DIAGNOSIS — H35073 Retinal telangiectasis, bilateral: Secondary | ICD-10-CM | POA: Diagnosis not present

## 2022-07-07 DIAGNOSIS — E113312 Type 2 diabetes mellitus with moderate nonproliferative diabetic retinopathy with macular edema, left eye: Secondary | ICD-10-CM | POA: Diagnosis not present

## 2022-07-07 LAB — HM DIABETES EYE EXAM

## 2022-07-08 ENCOUNTER — Ambulatory Visit: Payer: PPO | Admitting: Adult Health

## 2022-07-08 VITALS — BP 145/75 | HR 65 | Ht 61.0 in | Wt 141.6 lb

## 2022-07-08 DIAGNOSIS — G4733 Obstructive sleep apnea (adult) (pediatric): Secondary | ICD-10-CM

## 2022-07-08 NOTE — Progress Notes (Signed)
PATIENT: Whitney [name] DOB: February 07, 1935  REASON FOR VISIT: follow up HISTORY FROM: patient PRIMARY NEUROLOGIST: Dr. Rexene Alberts  Chief Complaint  Patient presents with   Follow-up    Rm 7, alone.  Cpap f/u after 3 hours of use will notice red light, leaking from mask then takes off.       HISTORY OF PRESENT ILLNESS: Today 07/08/22  Whitney [name] is a 88 y.o. female with a history of obstructive sleep apnea on CPAP.  She returns today for follow-up.  Her download indicates that she used her machine 36 had a 90 days for compliance of 40%.  She used her machine greater than 4 hours only 15 days for compliance of 17%.  Her average usage is 3 hours and 57 minutes.  Her residual AHI is 8.6 on 7 to 16 cm of water with EPR 3.  Leak in the 95th percentile is 60.4 L/min.  Her pressure in the 95th percentile was 10.2 cm of water.  She states that her mask leaks at night and wakes her up.  Therefore she typically takes it off.  She would like to use it more consistently.    REVIEW OF SYSTEMS: Out of a complete 14 system review of symptoms, the patient complains only of the following symptoms, and all other reviewed systems are negative.  ESS 11  ALLERGIES: No Known Allergies  HOME MEDICATIONS: Outpatient Medications Prior to Visit  Medication Sig Dispense Refill   Ascorbic Acid (VITAMIN C) 100 MG tablet Take 100 mg by mouth daily.     aspirin 81 MG tablet Take 1 tablet (81 mg total) by mouth daily. 90 tablet 1   blood glucose meter kit and supplies KIT Use to test blood sugar up to 3 times daily. DX E11.9 1 each 0   Calcium Carb-Cholecalciferol (CALCIUM-VITAMIN D) 500-200 MG-UNIT tablet Take 1 tablet by mouth 2 (two) times daily with a meal. 180 tablet 1   diclofenac Sodium (VOLTAREN) 1 % GEL Apply 4 g topically 4 (four) times daily. 100 g 6   donepezil (ARICEPT) 5 MG tablet Take 1 tablet (5 mg total) by mouth at bedtime. 90 tablet 1   dorzolamide-timolol (COSOPT) 2-0.5 % ophthalmic  solution Instill 1 drop in each eye twice a day 10 mL 6   DUREZOL 0.05 % EMUL      empagliflozin (JARDIANCE) 25 MG TABS tablet Take 1 tablet (25 mg total) by mouth daily before breakfast. 90 tablet 3   glimepiride (AMARYL) 4 MG tablet TAKE 1 TABLET EVERY DAY BEFORE BREAKFAST 90 tablet 3   Lancet Devices (ACCU-CHEK SOFTCLIX) lancets Use to test blood sugar up to 3 times a day. DX E11.9 1 each 0   lisinopril-hydrochlorothiazide (ZESTORETIC) 20-12.5 MG tablet Take 1 tablet by mouth daily. 90 tablet 3   metFORMIN (GLUCOPHAGE) 1000 MG tablet Take 1 tablet (1,000 mg total) by mouth 2 (two) times daily with a meal. 180 tablet 3   Multiple Vitamin (MULTI VITAMIN DAILY PO) Take one by mouth daily     ONETOUCH VERIO test strip Use to test blood sugar 3 times a day 250 each 0   predniSONE (DELTASONE) 20 MG tablet Take 2 tablets (40 mg total) by mouth daily with breakfast. 10 tablet 0   simvastatin (ZOCOR) 20 MG tablet Take 1 tablet (20 mg total) by mouth at bedtime. 90 tablet 3   zinc gluconate 50 MG tablet Take 50 mg by mouth daily.     No facility-administered  medications prior to visit.    PAST MEDICAL HISTORY: Past Medical History:  Diagnosis Date   Anxiety    Baker's cyst    Chest pain    Diabetes mellitus type 2, uncontrolled    Diabetes mellitus without complication (HCC)    Disc disease, degenerative, cervical    Diverticulosis of colon    DJD (degenerative joint disease) of knee    Facial pain, atypical    GERD (gastroesophageal reflux disease)    Glaucoma    Hypercholesteremia    Hypertension    Lumbar spondylosis    Obesity    Onychomycosis    Osteopenia     PAST SURGICAL HISTORY: Past Surgical History:  Procedure Laterality Date   right eye surgery  20-Oct-2010   retina in right eye   VAGINAL HYSTERECTOMY  July 20, 2000   Dr. Kem Kays repair    FAMILY HISTORY: Family History  Problem Relation Age of Onset   Diabetes Mother    Colon cancer Neg Hx    Stomach cancer Neg Hx      SOCIAL HISTORY: Social History   Socioeconomic History   Marital status: Widowed    Spouse name: deceased 2008/10/19   Number of children: Not on file   Years of education: Not on file   Highest education level: Not on file  Occupational History   Not on file  Tobacco Use   Smoking status: Never   Smokeless tobacco: Never  Vaping Use   Vaping Use: Never used  Substance and Sexual Activity   Alcohol use: No   Drug use: No   Sexual activity: Never  Other Topics Concern   Not on file  Social History Narrative   Lives alone   Spouse passed away July 20, 2008   Son;    3 girls   Clinical cytogeneticist;    Social Determinants of Health   Financial Resource Strain: Low Risk  (02/18/2020)   Overall Financial Resource Strain (CARDIA)    Difficulty of Paying Living Expenses: Not hard at all  Food Insecurity: No Food Insecurity (10/12/2021)   Hunger Vital Sign    Worried About Running Out of Food in the Last Year: Never true    Ran Out of Food in the Last Year: Never true  Transportation Needs: No Transportation Needs (10/12/2021)   PRAPARE - Hydrologist (Medical): No    Lack of Transportation (Non-Medical): No  Physical Activity: Unknown (04/25/2018)   Exercise Vital Sign    Days of Exercise per Week: 3 days    Minutes of Exercise per Session: Not on file  Stress: No Stress Concern Present (04/21/2017)   [place]    Feeling of Stress : Only a little  Social Connections: Unknown (04/25/2018)   Social Connection and Isolation Panel [NHANES]    Frequency of Communication with Friends and Family: Not on file    Frequency of Social Gatherings with Friends and Family: Not on file    Attends Religious Services: Not on file    Active Member of Clubs or Organizations: Yes    Attends Archivist Meetings: Not on file    Marital Status: Not on file  Intimate Partner Violence: Not on file      PHYSICAL  EXAM  Vitals:   07/08/22 1002 07/08/22 1015  BP: (!) 167/81 (!) 145/75  Pulse: 64 65  Weight: 141 lb 9.6 oz (64.2 kg)   Height: 5\' 1"  (1.549 m)  Body mass index is 26.76 kg/m.  Generalized: Well developed, in no acute distress  Chest: Lungs clear to auscultation bilaterally  Neurological examination  Mentation: Alert oriented to time, place, history taking. Follows all commands speech and language fluent Cranial nerve II-XII: Facial symmetry noted Gait and station: Gait is normal.    DIAGNOSTIC DATA (LABS, IMAGING, TESTING) - I reviewed patient records, labs, notes, testing and imaging myself where available.  Lab Results  Component Value Date   WBC 8.6 07/28/2021   HGB 13.5 07/28/2021   HCT 41.9 07/28/2021   MCV 79.7 (L) 07/28/2021   PLT 208 07/28/2021      Component Value Date/Time   NA 141 07/28/2021 1739   K 3.9 07/28/2021 1739   CL 100 07/28/2021 1739   CO2 30 07/28/2021 1739   GLUCOSE 139 (H) 07/28/2021 1739   BUN 14 07/28/2021 1739   CREATININE 0.79 07/28/2021 1739   CALCIUM 11.0 (H) 07/28/2021 1739   PROT 8.1 07/28/2021 1739   ALBUMIN 4.3 07/28/2021 1739   AST 21 07/28/2021 1739   ALT 15 07/28/2021 1739   ALKPHOS 40 07/28/2021 1739   BILITOT 1.5 (H) 07/28/2021 1739   GFRNONAA >60 07/28/2021 1739   GFRAA 156 02/19/2008 1103   Lab Results  Component Value Date   CHOL 128 07/21/2021   HDL 41.70 07/21/2021   LDLCALC 67 07/21/2021   TRIG 97.0 07/21/2021   CHOLHDL 3 07/21/2021   Lab Results  Component Value Date   HGBA1C 7.3 (A) 01/20/2022   No results found for: "VITAMINB12" Lab Results  Component Value Date   TSH 1.13 09/14/2013      ASSESSMENT AND PLAN 88 y.o. year old female  has a past medical history of Anxiety, Baker's cyst, Chest pain, Diabetes mellitus type 2, uncontrolled, Diabetes mellitus without complication ([place]), Disc disease, degenerative, cervical, Diverticulosis of colon, DJD (degenerative joint disease) of knee, Facial  pain, atypical, GERD (gastroesophageal reflux disease), Glaucoma, Hypercholesteremia, Hypertension, Lumbar spondylosis, Obesity, Onychomycosis, and Osteopenia. here with:  OSA on CPAP  - CPAP compliance suboptimal - Residual AHI slightly elevated most likely due to leakage - Mask refitting ordered - Encourage patient to use CPAP nightly and > 4 hours each night - F/U in 1 year or sooner if needed   Ward Givens, MSN, NP-C 07/08/2022, 10:18 AM [facility] [address] [place], [place] [address] [phone]

## 2022-07-08 NOTE — Patient Instructions (Signed)
Continue using CPAP nightly and greater than 4 hours each night Mask refitting ordered If your symptoms worsen or you develop new symptoms please let us know.   

## 2022-07-12 NOTE — Progress Notes (Signed)
RE: mask refitting Received: 3 days ago New, Willodean Rosenthal, RN; Alonna Minium; Minus Liberty; Nash Shearer Received, Thank you!       Previous Messages    ----- Message ----- From: Brandon Melnick, RN Sent: 07/08/2022  11:45 AM EDT To: Darlina Guys; Miquel Dunn; Nash Shearer; * Subject: mask refitting                                Good Morning,  New order in epic.  For mask refitting, but please address her machine, when she was in for appt the download had [name] name attached.  Thank you  Whitney [name] Female, 88 y.o., 11-25-1934

## 2022-07-16 DIAGNOSIS — G4733 Obstructive sleep apnea (adult) (pediatric): Secondary | ICD-10-CM | POA: Diagnosis not present

## 2022-07-22 ENCOUNTER — Ambulatory Visit (INDEPENDENT_AMBULATORY_CARE_PROVIDER_SITE_OTHER): Payer: PPO | Admitting: Internal Medicine

## 2022-07-22 ENCOUNTER — Encounter: Payer: Self-pay | Admitting: Internal Medicine

## 2022-07-22 VITALS — BP 160/84 | HR 59 | Temp 98.2°F | Ht 61.0 in | Wt 143.0 lb

## 2022-07-22 DIAGNOSIS — E1169 Type 2 diabetes mellitus with other specified complication: Secondary | ICD-10-CM | POA: Diagnosis not present

## 2022-07-22 DIAGNOSIS — Z0000 Encounter for general adult medical examination without abnormal findings: Secondary | ICD-10-CM

## 2022-07-22 DIAGNOSIS — E785 Hyperlipidemia, unspecified: Secondary | ICD-10-CM | POA: Diagnosis not present

## 2022-07-22 DIAGNOSIS — R413 Other amnesia: Secondary | ICD-10-CM | POA: Diagnosis not present

## 2022-07-22 DIAGNOSIS — G4733 Obstructive sleep apnea (adult) (pediatric): Secondary | ICD-10-CM | POA: Diagnosis not present

## 2022-07-22 DIAGNOSIS — I10 Essential (primary) hypertension: Secondary | ICD-10-CM | POA: Diagnosis not present

## 2022-07-22 LAB — LIPID PANEL
Cholesterol: 147 mg/dL (ref 0–200)
HDL: 43.8 mg/dL (ref >39.00)
LDL Cholesterol: 82 mg/dL (ref 0–99)
NonHDL: 103.55
Total CHOL/HDL Ratio: 3
Triglycerides: 107 mg/dL (ref 0.0–149.0)
VLDL: 21.4 mg/dL (ref 0.0–40.0)

## 2022-07-22 LAB — COMPREHENSIVE METABOLIC PANEL
ALT: 13 U/L (ref 0–35)
AST: 18 U/L (ref 0–37)
Albumin: 4.2 g/dL (ref 3.5–5.2)
Alkaline Phosphatase: 51 U/L (ref 39–117)
BUN: 11 mg/dL (ref 6–23)
CO2: 29 mEq/L (ref 19–32)
Calcium: 9.9 mg/dL (ref 8.4–10.5)
Chloride: 105 mEq/L (ref 96–112)
Creatinine, Ser: 0.7 mg/dL (ref 0.40–1.20)
GFR: 78.84 mL/min (ref >60.00)
Glucose, Bld: 147 mg/dL — ABNORMAL HIGH (ref 70–99)
Potassium: 4.1 mEq/L (ref 3.5–5.1)
Sodium: 141 mEq/L (ref 135–145)
Total Bilirubin: 0.9 mg/dL (ref 0.2–1.2)
Total Protein: 7.5 g/dL (ref 6.0–8.3)

## 2022-07-22 LAB — CBC
HCT: 40.3 % (ref 36.0–46.0)
Hemoglobin: 13.1 g/dL (ref 12.0–15.0)
MCHC: 32.4 g/dL (ref 30.0–36.0)
MCV: 80.8 fl (ref 78.0–100.0)
Platelets: 216 10*3/uL (ref 150.0–400.0)
RBC: 4.98 Mil/uL (ref 3.87–5.11)
RDW: 14.9 % (ref 11.5–15.5)
WBC: 5.5 10*3/uL (ref 4.0–10.5)

## 2022-07-22 LAB — HEMOGLOBIN A1C: Hgb A1c MFr Bld: 7.9 % — ABNORMAL HIGH (ref 4.6–6.5)

## 2022-07-22 LAB — MICROALBUMIN / CREATININE URINE RATIO
Creatinine,U: 49.1 mg/dL
Microalb Creat Ratio: 6.8 mg/g (ref 0.0–30.0)
Microalb, Ur: 3.3 mg/dL — ABNORMAL HIGH (ref 0.0–1.9)

## 2022-07-22 MED ORDER — METFORMIN HCL 1000 MG PO TABS: 1000.0000 mg | ORAL_TABLET | Freq: Two times a day (BID) | ORAL | 3 refills | Status: DC

## 2022-07-22 MED ORDER — GLIMEPIRIDE 4 MG PO TABS: ORAL_TABLET | ORAL | 3 refills | Status: DC

## 2022-07-22 MED ORDER — SIMVASTATIN 20 MG PO TABS: 20.0000 mg | ORAL_TABLET | Freq: Every day | ORAL | 3 refills | Status: DC

## 2022-07-22 MED ORDER — AMLODIPINE BESYLATE 10 MG PO TABS: 10.0000 mg | ORAL_TABLET | Freq: Every day | ORAL | 3 refills | Status: DC

## 2022-07-22 MED ORDER — EMPAGLIFLOZIN 25 MG PO TABS: 25.0000 mg | ORAL_TABLET | Freq: Every day | ORAL | 3 refills | Status: DC

## 2022-07-22 MED ORDER — DICLOFENAC SODIUM 1 % EX GEL: 4.0000 g | Freq: Four times a day (QID) | CUTANEOUS | 6 refills | Status: DC

## 2022-07-22 MED ORDER — DONEPEZIL HCL 10 MG PO TABS: 10.0000 mg | ORAL_TABLET | Freq: Every day | ORAL | 3 refills | Status: DC

## 2022-07-22 MED ORDER — LISINOPRIL-HYDROCHLOROTHIAZIDE 20-25 MG PO TABS: 1.0000 | ORAL_TABLET | Freq: Every day | ORAL | 3 refills | Status: DC

## 2022-07-22 NOTE — Patient Instructions (Signed)
We have sent in the refills for all the medicines.

## 2022-07-22 NOTE — Assessment & Plan Note (Signed)
Uses most nights with full mask.

## 2022-07-22 NOTE — Progress Notes (Signed)
Subjective:   Patient ID: Whitney PeonHattie R [name], female    DOB: 18-Sep-1934, 88 y.o.   MRN: [id number]  HPI Here for medicare wellness and physical, no new complaints. Please see A/P for status and treatment of chronic medical problems.   Diet: DM since diabetic Physical activity: sedentary Depression/mood screen: negative Hearing: moderate loss, lost hearing aids has not been able to replace Visual acuity: grossly normal with lens, performs annual eye exam gets injections regularly ADLs: capable Fall risk: none Home safety: good Cognitive evaluation: intact to orientation, naming, recall and repetition EOL planning: adv directives discussed  Flowsheet Row [facility] [place] [place]  PHQ-2 Total Score 0       Flowsheet Row [facility] [place] [place]  PHQ-9 Total Score 0         07/28/2021    5:12 PM 08/17/2021    9:50 AM 10/12/2021   11:55 AM 04/27/2022    9:04 AM 07/22/2022    9:13 AM  Fall Risk  Falls in the past year?   1 0 0  Was there an injury with Fall?  1 1 0 0  Fall Risk Category Calculator   2 0 0  Fall Risk Category (Retired)   Moderate    (RETIRED) Patient Fall Risk Level High fall risk Moderate fall risk Moderate fall risk    Patient at Risk for Falls Due to  History of fall(s);Medication side effect History of fall(s);Medication side effect;Other (Comment)    Fall risk Follow up  Falls prevention discussed Falls prevention discussed Falls evaluation completed Falls evaluation completed    I have personally reviewed and have noted 1. The patient's medical and social history - reviewed today no changes 2. Their use of alcohol, tobacco or illicit drugs 3. Their current medications and supplements 4. The patient's functional ability including ADL's, fall risks, home safety risks and hearing or visual impairment. 5. Diet and physical activities 6. Evidence for  depression or mood disorders 7. Care team reviewed and updated 8.  The patient is not on an opioid pain medication.  Patient Care Team: Myrlene Brokerrawford, Geana Walts A, MD as PCP - General (Internal Medicine) Michele McalpineNadel, Scott M, MD as Consulting Physician (Pulmonary Disease) Carrington Clampuchman, Richard C, DPM (Inactive) as Consulting Physician (Podiatry) Szabat, Vinnie Levelaniel C, [facility] (Inactive) (Pharmacist) Past Medical History:  Diagnosis Date   Anxiety    Baker's cyst    Chest pain    Diabetes mellitus type 2, uncontrolled    Diabetes mellitus without complication    Disc disease, degenerative, cervical    Diverticulosis of colon    DJD (degenerative joint disease) of knee    Facial pain, atypical    GERD (gastroesophageal reflux disease)    Glaucoma    Hypercholesteremia    Hypertension    Lumbar spondylosis    Obesity    Onychomycosis    Osteopenia    Past Surgical History:  Procedure Laterality Date   right eye surgery  09/2010   retina in right eye   VAGINAL HYSTERECTOMY  2002   Dr. Donny PiqueHenley--AP repair   Family History  Problem Relation Age of Onset   Diabetes Mother    Colon cancer Neg Hx    Stomach cancer Neg Hx    Review of Systems  Constitutional: Negative.   HENT: Negative.    Eyes: Negative.   Respiratory:  Negative for cough, chest tightness and shortness of breath.  Cardiovascular:  Negative for chest pain, palpitations and leg swelling.  Gastrointestinal:  Negative for abdominal distention, abdominal pain, constipation, diarrhea, nausea and vomiting.  Musculoskeletal:  Positive for arthralgias.  Skin: Negative.   Neurological: Negative.   Psychiatric/Behavioral: Negative.      Objective:  Physical Exam Constitutional:      Appearance: She is well-developed.  HENT:     Head: Normocephalic and atraumatic.  Cardiovascular:     Rate and Rhythm: Normal rate and regular rhythm.  Pulmonary:     Effort: Pulmonary effort is normal. No respiratory distress.     Breath sounds:  Normal breath sounds. No wheezing or rales.  Abdominal:     General: Bowel sounds are normal. There is no distension.     Palpations: Abdomen is soft.     Tenderness: There is no abdominal tenderness. There is no rebound.  Musculoskeletal:        General: Tenderness present.     Cervical back: Normal range of motion.  Skin:    General: Skin is warm and dry.     Comments: Foot exam done  Neurological:     Mental Status: She is alert and oriented to person, place, and time.     Coordination: Coordination normal.     Vitals:   07/22/22 0910 07/22/22 0913  BP: (!) 160/84 (!) 160/84  Pulse: (!) 59   Temp: 98.2 F (36.8 C)   TempSrc: Oral   SpO2: 98%   Weight: 143 lb (64.9 kg)   Height: 5\' 1"  (1.549 m)     Assessment & Plan:

## 2022-07-23 ENCOUNTER — Encounter: Payer: Self-pay | Admitting: Internal Medicine

## 2022-07-23 NOTE — Assessment & Plan Note (Signed)
Flu shot yearly. Pneumonia complete. Shingrix counseled. Tetanus counseled due. Colonoscopy aged out. Mammogram aged out, pap smear aged out and dexa complete. Counseled about sun safety and mole surveillance. Counseled about the dangers of distracted driving. Given 10 year screening recommendations.

## 2022-07-23 NOTE — Assessment & Plan Note (Signed)
BP mildly elevated today with meds. Previous was elevated as well. Will increase lisinopril/hctz to 20/25 mg daily and keep amlodipine 10 mg daily. Checking CMP and CBC today.

## 2022-07-23 NOTE — Assessment & Plan Note (Signed)
Doing well with aricept so will increase to 10 mg daily as planned. New rx done today.

## 2022-07-23 NOTE — Assessment & Plan Note (Signed)
Checking HgA1c, foot exam done. Eye exam up to date. Adjust metformin 1000 mg BID and amaryl 4 mg daily and jardiance 25 mg daily as needed. Goal HgA1c <8.

## 2022-07-23 NOTE — Assessment & Plan Note (Signed)
Checking lipid panel and adjust simvastatin 20 mg daily as needed for LDL<100. 

## 2022-07-27 ENCOUNTER — Telehealth: Payer: Self-pay | Admitting: Internal Medicine

## 2022-07-27 NOTE — Telephone Encounter (Signed)
Patient has questions about below prescriptions. She wants to make sure what they are bothe for, but I don't see pioglitazone on her med list. amLODipine (NORVASC) 10 MG tablet and pioglitazone. She would like a callback at [phone].

## 2022-07-27 NOTE — Telephone Encounter (Signed)
Called pt she states just wanted to make sure she was taking her medication right. MD change BP med dosage. Inform pt yes Lisinopril 20/25mg  which she states she pick up. The Pioglitazone was d/c no longer should be taking. Pt states she didn't have that one. Pt understood her meds.Marland KitchenRaechel Chute

## 2022-07-29 DIAGNOSIS — E113311 Type 2 diabetes mellitus with moderate nonproliferative diabetic retinopathy with macular edema, right eye: Secondary | ICD-10-CM | POA: Diagnosis not present

## 2022-07-29 DIAGNOSIS — H35073 Retinal telangiectasis, bilateral: Secondary | ICD-10-CM | POA: Diagnosis not present

## 2022-07-29 DIAGNOSIS — E113312 Type 2 diabetes mellitus with moderate nonproliferative diabetic retinopathy with macular edema, left eye: Secondary | ICD-10-CM | POA: Diagnosis not present

## 2022-07-29 DIAGNOSIS — H401121 Primary open-angle glaucoma, left eye, mild stage: Secondary | ICD-10-CM | POA: Diagnosis not present

## 2022-07-29 DIAGNOSIS — H401131 Primary open-angle glaucoma, bilateral, mild stage: Secondary | ICD-10-CM | POA: Diagnosis not present

## 2022-08-31 DIAGNOSIS — E113311 Type 2 diabetes mellitus with moderate nonproliferative diabetic retinopathy with macular edema, right eye: Secondary | ICD-10-CM | POA: Diagnosis not present

## 2022-08-31 DIAGNOSIS — H401121 Primary open-angle glaucoma, left eye, mild stage: Secondary | ICD-10-CM | POA: Diagnosis not present

## 2022-08-31 DIAGNOSIS — E113312 Type 2 diabetes mellitus with moderate nonproliferative diabetic retinopathy with macular edema, left eye: Secondary | ICD-10-CM | POA: Diagnosis not present

## 2022-08-31 LAB — HM DIABETES EYE EXAM

## 2022-09-01 ENCOUNTER — Encounter: Payer: Self-pay | Admitting: Internal Medicine

## 2022-09-01 DIAGNOSIS — H401121 Primary open-angle glaucoma, left eye, mild stage: Secondary | ICD-10-CM | POA: Diagnosis not present

## 2022-09-01 DIAGNOSIS — H401131 Primary open-angle glaucoma, bilateral, mild stage: Secondary | ICD-10-CM | POA: Diagnosis not present

## 2022-09-01 DIAGNOSIS — E113311 Type 2 diabetes mellitus with moderate nonproliferative diabetic retinopathy with macular edema, right eye: Secondary | ICD-10-CM | POA: Diagnosis not present

## 2022-09-01 DIAGNOSIS — E113312 Type 2 diabetes mellitus with moderate nonproliferative diabetic retinopathy with macular edema, left eye: Secondary | ICD-10-CM | POA: Diagnosis not present

## 2022-10-17 DIAGNOSIS — G4733 Obstructive sleep apnea (adult) (pediatric): Secondary | ICD-10-CM | POA: Diagnosis not present

## 2022-10-19 DIAGNOSIS — H35073 Retinal telangiectasis, bilateral: Secondary | ICD-10-CM | POA: Diagnosis not present

## 2022-10-19 DIAGNOSIS — H401121 Primary open-angle glaucoma, left eye, mild stage: Secondary | ICD-10-CM | POA: Diagnosis not present

## 2022-10-19 DIAGNOSIS — H401131 Primary open-angle glaucoma, bilateral, mild stage: Secondary | ICD-10-CM | POA: Diagnosis not present

## 2022-10-19 DIAGNOSIS — E113312 Type 2 diabetes mellitus with moderate nonproliferative diabetic retinopathy with macular edema, left eye: Secondary | ICD-10-CM | POA: Diagnosis not present

## 2022-10-19 DIAGNOSIS — E113311 Type 2 diabetes mellitus with moderate nonproliferative diabetic retinopathy with macular edema, right eye: Secondary | ICD-10-CM | POA: Diagnosis not present

## 2022-10-20 DIAGNOSIS — H1131 Conjunctival hemorrhage, right eye: Secondary | ICD-10-CM | POA: Diagnosis not present

## 2022-10-20 DIAGNOSIS — H35073 Retinal telangiectasis, bilateral: Secondary | ICD-10-CM | POA: Diagnosis not present

## 2022-10-20 DIAGNOSIS — H401131 Primary open-angle glaucoma, bilateral, mild stage: Secondary | ICD-10-CM | POA: Diagnosis not present

## 2022-10-20 DIAGNOSIS — E113313 Type 2 diabetes mellitus with moderate nonproliferative diabetic retinopathy with macular edema, bilateral: Secondary | ICD-10-CM | POA: Diagnosis not present

## 2022-10-20 DIAGNOSIS — H401121 Primary open-angle glaucoma, left eye, mild stage: Secondary | ICD-10-CM | POA: Diagnosis not present

## 2022-10-27 LAB — AMB RESULTS CONSOLE CBG: Glucose: 185

## 2022-10-27 LAB — HEMOGLOBIN A1C: Hemoglobin A1C: 7.4

## 2022-11-05 NOTE — Progress Notes (Signed)
HEALTH EQUITY SCREENING EVENT  DATE: 10/25/2022 SCREENING: B/P  116/67                 :   SDOH NEEDS: None have been indicated at the time of the event. PER CHART REVIEW/PCP: The PT.'s PCP office verified that the PT. Is active and have been within the past 12 months, via telephone @ 1:37 PM. APPOINTMENTS: Chart review also indicates a future appt. On 01/2023. Normal results letter sent to mychart. No addidtional Health Equity Team support indicated at this time.

## 2022-11-08 DIAGNOSIS — E113312 Type 2 diabetes mellitus with moderate nonproliferative diabetic retinopathy with macular edema, left eye: Secondary | ICD-10-CM | POA: Diagnosis not present

## 2022-11-08 DIAGNOSIS — H401121 Primary open-angle glaucoma, left eye, mild stage: Secondary | ICD-10-CM | POA: Diagnosis not present

## 2022-11-08 DIAGNOSIS — E113311 Type 2 diabetes mellitus with moderate nonproliferative diabetic retinopathy with macular edema, right eye: Secondary | ICD-10-CM | POA: Diagnosis not present

## 2022-11-08 DIAGNOSIS — H401131 Primary open-angle glaucoma, bilateral, mild stage: Secondary | ICD-10-CM | POA: Diagnosis not present

## 2022-11-08 DIAGNOSIS — H1131 Conjunctival hemorrhage, right eye: Secondary | ICD-10-CM | POA: Diagnosis not present

## 2022-11-08 DIAGNOSIS — H35073 Retinal telangiectasis, bilateral: Secondary | ICD-10-CM | POA: Diagnosis not present

## 2022-11-08 LAB — HM DIABETES EYE EXAM

## 2022-12-07 DIAGNOSIS — H401131 Primary open-angle glaucoma, bilateral, mild stage: Secondary | ICD-10-CM | POA: Diagnosis not present

## 2022-12-07 DIAGNOSIS — E113311 Type 2 diabetes mellitus with moderate nonproliferative diabetic retinopathy with macular edema, right eye: Secondary | ICD-10-CM | POA: Diagnosis not present

## 2022-12-07 DIAGNOSIS — H35073 Retinal telangiectasis, bilateral: Secondary | ICD-10-CM | POA: Diagnosis not present

## 2022-12-07 DIAGNOSIS — E113312 Type 2 diabetes mellitus with moderate nonproliferative diabetic retinopathy with macular edema, left eye: Secondary | ICD-10-CM | POA: Diagnosis not present

## 2022-12-07 LAB — HM DIABETES EYE EXAM

## 2022-12-15 ENCOUNTER — Encounter: Payer: Self-pay | Admitting: Internal Medicine

## 2022-12-15 ENCOUNTER — Ambulatory Visit: Payer: PPO | Admitting: Internal Medicine

## 2022-12-15 VITALS — BP 124/80 | HR 62 | Temp 97.9°F | Ht 61.0 in | Wt 140.0 lb

## 2022-12-15 DIAGNOSIS — I10 Essential (primary) hypertension: Secondary | ICD-10-CM

## 2022-12-15 DIAGNOSIS — I872 Venous insufficiency (chronic) (peripheral): Secondary | ICD-10-CM

## 2022-12-15 LAB — CBC
HCT: 40.9 % (ref 36.0–46.0)
Hemoglobin: 13 g/dL (ref 12.0–15.0)
MCHC: 31.8 g/dL (ref 30.0–36.0)
MCV: 79.7 fl (ref 78.0–100.0)
Platelets: 238 10*3/uL (ref 150.0–400.0)
RBC: 5.14 Mil/uL — ABNORMAL HIGH (ref 3.87–5.11)
RDW: 16.2 % — ABNORMAL HIGH (ref 11.5–15.5)
WBC: 5.6 10*3/uL (ref 4.0–10.5)

## 2022-12-15 LAB — COMPREHENSIVE METABOLIC PANEL
ALT: 15 U/L (ref 0–35)
AST: 18 U/L (ref 0–37)
Albumin: 4.1 g/dL (ref 3.5–5.2)
Alkaline Phosphatase: 50 U/L (ref 39–117)
BUN: 10 mg/dL (ref 6–23)
CO2: 29 meq/L (ref 19–32)
Calcium: 9.9 mg/dL (ref 8.4–10.5)
Chloride: 105 meq/L (ref 96–112)
Creatinine, Ser: 0.77 mg/dL (ref 0.40–1.20)
GFR: 70.13 mL/min (ref >60.00)
Glucose, Bld: 112 mg/dL — ABNORMAL HIGH (ref 70–99)
Potassium: 3.6 meq/L (ref 3.5–5.1)
Sodium: 142 meq/L (ref 135–145)
Total Bilirubin: 0.9 mg/dL (ref 0.2–1.2)
Total Protein: 8 g/dL (ref 6.0–8.3)

## 2022-12-15 LAB — BRAIN NATRIURETIC PEPTIDE: Pro B Natriuretic peptide (BNP): 9 pg/mL (ref 0.0–100.0)

## 2022-12-15 NOTE — Assessment & Plan Note (Signed)
BP at goal and she is taking amlodipine 10 mg daily which can contribute to foot/ankle swelling which we have discussed several times over the last 5 years. Keep amlodipine and lisinopril/hydrochlorothiazide 20/25 mg daily for BP. Checking CMP and CBC and BNP.

## 2022-12-15 NOTE — Assessment & Plan Note (Signed)
Suspect this is ongoing. She does not describe diet/sodium changes or fluid intake changes however she was not sure. No recent travel. No signs of DVT today. Checking CBC, CMP, BNP to rule out new changes. Prior echo 2021 wnl.

## 2022-12-15 NOTE — Progress Notes (Signed)
   Subjective:   Patient ID: Whitney [name], female    DOB: 22-Aug-1934, 88 y.o.   MRN: [id number]  HPI The patient is a 88 YO female coming in for ongoing swelling. Maybe worse lately. Some swelling in the legs and stomach.  Review of Systems  Constitutional: Negative.   HENT: Negative.    Eyes: Negative.   Respiratory:  Negative for cough, chest tightness and shortness of breath.   Cardiovascular:  Positive for leg swelling. Negative for chest pain and palpitations.  Gastrointestinal:  Negative for abdominal distention, abdominal pain, constipation, diarrhea, nausea and vomiting.  Musculoskeletal: Negative.   Skin: Negative.   Neurological: Negative.   Psychiatric/Behavioral: Negative.      Objective:  Physical Exam Constitutional:      Appearance: She is well-developed.  HENT:     Head: Normocephalic and atraumatic.  Cardiovascular:     Rate and Rhythm: Normal rate and regular rhythm.  Pulmonary:     Effort: Pulmonary effort is normal. No respiratory distress.     Breath sounds: Normal breath sounds. No wheezing or rales.  Abdominal:     General: Bowel sounds are normal. There is no distension.     Palpations: Abdomen is soft.     Tenderness: There is no abdominal tenderness. There is no rebound.  Musculoskeletal:     Cervical back: Normal range of motion.     Comments: 1+ edema feet/ankle, none in the legs  Skin:    General: Skin is warm and dry.  Neurological:     Mental Status: She is alert and oriented to person, place, and time.     Coordination: Coordination normal.     Vitals:   12/15/22 1103  BP: 124/80  Pulse: 62  Temp: 97.9 F (36.6 C)  TempSrc: Oral  SpO2: 98%  Weight: 140 lb (63.5 kg)  Height: 5\' 1"  (1.549 m)    Assessment & Plan:

## 2022-12-15 NOTE — Patient Instructions (Signed)
We will check the labs today. 

## 2023-01-03 ENCOUNTER — Encounter: Payer: Self-pay | Admitting: *Deleted

## 2023-01-03 NOTE — Progress Notes (Signed)
Pt attended 10/27/22 screening event where her b/p was 116/67 and her blood sugar was 185 and here A1C was 7.4. At the event, the pt confirmed her PCP is Dr. Hillard Danker from the [facility], and pt did not identify any SDOH insecurities. During the initial event f/u, health equity team member confirmed pt had been seeing Dr. Okey Dupre as her PCP over the past 12 months. During this 60 day event f/u, chart review indicates pt was seen by Dr. Okey Dupre on 12/15/22 when her b/p was 124/80 and her blood sugar was 112. Pt also has future appt with her PCP on 01/21/23 and has been seen on an ongoing basis by her ophthalmologist/retinal specialist. No additional health equity team support indicated at this time.

## 2023-01-10 DIAGNOSIS — H35071 Retinal telangiectasis, right eye: Secondary | ICD-10-CM | POA: Diagnosis not present

## 2023-01-10 DIAGNOSIS — H35072 Retinal telangiectasis, left eye: Secondary | ICD-10-CM | POA: Diagnosis not present

## 2023-01-10 DIAGNOSIS — E113311 Type 2 diabetes mellitus with moderate nonproliferative diabetic retinopathy with macular edema, right eye: Secondary | ICD-10-CM | POA: Diagnosis not present

## 2023-01-10 DIAGNOSIS — E113312 Type 2 diabetes mellitus with moderate nonproliferative diabetic retinopathy with macular edema, left eye: Secondary | ICD-10-CM | POA: Diagnosis not present

## 2023-01-10 DIAGNOSIS — H401131 Primary open-angle glaucoma, bilateral, mild stage: Secondary | ICD-10-CM | POA: Diagnosis not present

## 2023-01-10 LAB — HM DIABETES EYE EXAM

## 2023-01-20 DIAGNOSIS — Z961 Presence of intraocular lens: Secondary | ICD-10-CM | POA: Diagnosis not present

## 2023-01-20 DIAGNOSIS — H401131 Primary open-angle glaucoma, bilateral, mild stage: Secondary | ICD-10-CM | POA: Diagnosis not present

## 2023-01-20 DIAGNOSIS — H4323 Crystalline deposits in vitreous body, bilateral: Secondary | ICD-10-CM | POA: Diagnosis not present

## 2023-01-20 DIAGNOSIS — E113313 Type 2 diabetes mellitus with moderate nonproliferative diabetic retinopathy with macular edema, bilateral: Secondary | ICD-10-CM | POA: Diagnosis not present

## 2023-01-20 DIAGNOSIS — H02423 Myogenic ptosis of bilateral eyelids: Secondary | ICD-10-CM | POA: Diagnosis not present

## 2023-01-21 ENCOUNTER — Ambulatory Visit: Payer: PPO | Admitting: Internal Medicine

## 2023-01-25 ENCOUNTER — Encounter: Payer: Self-pay | Admitting: Internal Medicine

## 2023-01-25 DIAGNOSIS — H35071 Retinal telangiectasis, right eye: Secondary | ICD-10-CM | POA: Diagnosis not present

## 2023-01-25 DIAGNOSIS — H401131 Primary open-angle glaucoma, bilateral, mild stage: Secondary | ICD-10-CM | POA: Diagnosis not present

## 2023-01-25 DIAGNOSIS — E113311 Type 2 diabetes mellitus with moderate nonproliferative diabetic retinopathy with macular edema, right eye: Secondary | ICD-10-CM | POA: Diagnosis not present

## 2023-01-25 DIAGNOSIS — H35072 Retinal telangiectasis, left eye: Secondary | ICD-10-CM | POA: Diagnosis not present

## 2023-01-25 DIAGNOSIS — G4733 Obstructive sleep apnea (adult) (pediatric): Secondary | ICD-10-CM | POA: Diagnosis not present

## 2023-01-25 DIAGNOSIS — E113312 Type 2 diabetes mellitus with moderate nonproliferative diabetic retinopathy with macular edema, left eye: Secondary | ICD-10-CM | POA: Diagnosis not present

## 2023-01-27 DIAGNOSIS — G4733 Obstructive sleep apnea (adult) (pediatric): Secondary | ICD-10-CM | POA: Diagnosis not present

## 2023-01-31 ENCOUNTER — Ambulatory Visit: Payer: PPO | Admitting: Internal Medicine

## 2023-01-31 ENCOUNTER — Encounter: Payer: Self-pay | Admitting: Internal Medicine

## 2023-01-31 VITALS — BP 138/66 | HR 64 | Temp 97.7°F | Ht 61.0 in | Wt 138.0 lb

## 2023-01-31 DIAGNOSIS — Z23 Encounter for immunization: Secondary | ICD-10-CM

## 2023-01-31 DIAGNOSIS — Z7984 Long term (current) use of oral hypoglycemic drugs: Secondary | ICD-10-CM

## 2023-01-31 DIAGNOSIS — E113312 Type 2 diabetes mellitus with moderate nonproliferative diabetic retinopathy with macular edema, left eye: Secondary | ICD-10-CM | POA: Diagnosis not present

## 2023-01-31 DIAGNOSIS — E1169 Type 2 diabetes mellitus with other specified complication: Secondary | ICD-10-CM | POA: Diagnosis not present

## 2023-01-31 DIAGNOSIS — E785 Hyperlipidemia, unspecified: Secondary | ICD-10-CM

## 2023-01-31 DIAGNOSIS — H35072 Retinal telangiectasis, left eye: Secondary | ICD-10-CM | POA: Diagnosis not present

## 2023-01-31 LAB — COMPREHENSIVE METABOLIC PANEL
ALT: 15 U/L (ref 0–35)
AST: 21 U/L (ref 0–37)
Albumin: 4.2 g/dL (ref 3.5–5.2)
Alkaline Phosphatase: 51 U/L (ref 39–117)
BUN: 6 mg/dL (ref 6–23)
CO2: 28 meq/L (ref 19–32)
Calcium: 9.9 mg/dL (ref 8.4–10.5)
Chloride: 103 meq/L (ref 96–112)
Creatinine, Ser: 0.76 mg/dL (ref 0.40–1.20)
GFR: 71.17 mL/min (ref >60.00)
Glucose, Bld: 129 mg/dL — ABNORMAL HIGH (ref 70–99)
Potassium: 3.4 meq/L — ABNORMAL LOW (ref 3.5–5.1)
Sodium: 142 meq/L (ref 135–145)
Total Bilirubin: 1 mg/dL (ref 0.2–1.2)
Total Protein: 7.7 g/dL (ref 6.0–8.3)

## 2023-01-31 LAB — HEMOGLOBIN A1C: Hgb A1c MFr Bld: 7.3 % — ABNORMAL HIGH (ref 4.6–6.5)

## 2023-01-31 MED ORDER — DICLOFENAC SODIUM 1 % EX GEL: 4.0000 g | Freq: Four times a day (QID) | CUTANEOUS | 6 refills | Status: AC

## 2023-01-31 MED ORDER — ONETOUCH VERIO VI STRP: ORAL_STRIP | 0 refills | Status: DC

## 2023-01-31 NOTE — Assessment & Plan Note (Signed)
Stable and getting injections. Vision is adequate and diabetes and HTN well controlled.

## 2023-01-31 NOTE — Assessment & Plan Note (Signed)
Checking HgA1c and adjust as needed. Goal <8. She is taking metformin 1000 mg BID and jardiance 25 mg daily and amaryl 4 mg daily. On ACE-I and statin.

## 2023-01-31 NOTE — Assessment & Plan Note (Signed)
Stable seeing optho regularly.

## 2023-01-31 NOTE — Progress Notes (Signed)
   Subjective:   Patient ID: Whitney [name], female    DOB: [birth year], 88 y.o.   MRN: [id number]  HPI The patient is an 88 YO female coming in for follow up.  Review of Systems  Constitutional: Negative.   HENT: Negative.    Eyes: Negative.   Respiratory:  Negative for cough, chest tightness and shortness of breath.   Cardiovascular:  Negative for chest pain, palpitations and leg swelling.  Gastrointestinal:  Negative for abdominal distention, abdominal pain, constipation, diarrhea, nausea and vomiting.  Musculoskeletal: Negative.   Skin: Negative.   Neurological: Negative.   Psychiatric/Behavioral: Negative.      Objective:  Physical Exam Constitutional:      Appearance: She is well-developed.  HENT:     Head: Normocephalic and atraumatic.  Cardiovascular:     Rate and Rhythm: Normal rate and regular rhythm.  Pulmonary:     Effort: Pulmonary effort is normal. No respiratory distress.     Breath sounds: Normal breath sounds. No wheezing or rales.  Abdominal:     General: Bowel sounds are normal. There is no distension.     Palpations: Abdomen is soft.     Tenderness: There is no abdominal tenderness. There is no rebound.  Musculoskeletal:     Cervical back: Normal range of motion.  Skin:    General: Skin is warm and dry.  Neurological:     Mental Status: She is alert and oriented to person, place, and time.     Coordination: Coordination normal.     Vitals:   01/31/23 0809  BP: 138/66  Pulse: 64  Temp: 97.7 F (36.5 C)  TempSrc: Oral  SpO2: 95%  Weight: 138 lb (62.6 kg)  Height: 5\' 1"  (1.549 m)   Assessment & Plan:  Flu shot given at visit

## 2023-01-31 NOTE — Patient Instructions (Addendum)
We will check the labs today and have given you the flu shot. 

## 2023-03-14 DIAGNOSIS — E113312 Type 2 diabetes mellitus with moderate nonproliferative diabetic retinopathy with macular edema, left eye: Secondary | ICD-10-CM | POA: Diagnosis not present

## 2023-03-14 DIAGNOSIS — H35073 Retinal telangiectasis, bilateral: Secondary | ICD-10-CM | POA: Diagnosis not present

## 2023-03-14 DIAGNOSIS — H401131 Primary open-angle glaucoma, bilateral, mild stage: Secondary | ICD-10-CM | POA: Diagnosis not present

## 2023-03-14 DIAGNOSIS — E113311 Type 2 diabetes mellitus with moderate nonproliferative diabetic retinopathy with macular edema, right eye: Secondary | ICD-10-CM | POA: Diagnosis not present

## 2023-03-15 DIAGNOSIS — H401131 Primary open-angle glaucoma, bilateral, mild stage: Secondary | ICD-10-CM | POA: Diagnosis not present

## 2023-03-15 DIAGNOSIS — H35073 Retinal telangiectasis, bilateral: Secondary | ICD-10-CM | POA: Diagnosis not present

## 2023-03-15 DIAGNOSIS — E113312 Type 2 diabetes mellitus with moderate nonproliferative diabetic retinopathy with macular edema, left eye: Secondary | ICD-10-CM | POA: Diagnosis not present

## 2023-03-15 DIAGNOSIS — E113311 Type 2 diabetes mellitus with moderate nonproliferative diabetic retinopathy with macular edema, right eye: Secondary | ICD-10-CM | POA: Diagnosis not present

## 2023-04-18 ENCOUNTER — Telehealth: Payer: Self-pay

## 2023-04-18 NOTE — Telephone Encounter (Signed)
 She does not need to check sugars routinely is there a reason she would like one?

## 2023-04-18 NOTE — Telephone Encounter (Signed)
 Copied from CRM [id number]. Topic: Clinical - Medical Advice >> Apr 18, 2023 10:06 AM Leila C wrote:  Reason for CRM: Patient would like to talk to nurse about getting a blood glucose monitor, please call back at [phone].  [id number] [place], [place] -  [address] [place] [address] Phone: [phone] Fax: [phone]

## 2023-04-18 NOTE — Telephone Encounter (Signed)
Is this [place] to do for patient?

## 2023-04-19 NOTE — Telephone Encounter (Signed)
 Ok to send in daily prn testing glucose meter per patient and insurance preference.

## 2023-04-19 NOTE — Telephone Encounter (Signed)
 Patient states that she would like to know and just check it occasionally at home

## 2023-04-20 MED ORDER — BLOOD GLUCOSE TEST VI STRP: 1.0000 | ORAL_STRIP | Freq: Three times a day (TID) | 0 refills | Status: AC

## 2023-04-20 MED ORDER — BLOOD GLUCOSE MONITORING SUPPL DEVI: 1.0000 | Freq: Three times a day (TID) | 0 refills | Status: AC

## 2023-04-20 MED ORDER — LANCET DEVICE MISC: 1.0000 | Freq: Three times a day (TID) | 0 refills | Status: AC

## 2023-04-20 MED ORDER — LANCETS MISC. MISC: 1.0000 | Freq: Three times a day (TID) | 0 refills | Status: AC

## 2023-04-20 NOTE — Telephone Encounter (Signed)
I have sent in meter

## 2023-04-26 ENCOUNTER — Other Ambulatory Visit: Payer: Self-pay | Admitting: Internal Medicine

## 2023-04-26 DIAGNOSIS — Z1231 Encounter for screening mammogram for malignant neoplasm of breast: Secondary | ICD-10-CM

## 2023-05-13 DIAGNOSIS — G4733 Obstructive sleep apnea (adult) (pediatric): Secondary | ICD-10-CM | POA: Diagnosis not present

## 2023-05-17 DIAGNOSIS — H401131 Primary open-angle glaucoma, bilateral, mild stage: Secondary | ICD-10-CM | POA: Diagnosis not present

## 2023-05-17 DIAGNOSIS — H35073 Retinal telangiectasis, bilateral: Secondary | ICD-10-CM | POA: Diagnosis not present

## 2023-05-17 DIAGNOSIS — E113312 Type 2 diabetes mellitus with moderate nonproliferative diabetic retinopathy with macular edema, left eye: Secondary | ICD-10-CM | POA: Diagnosis not present

## 2023-05-17 DIAGNOSIS — E113311 Type 2 diabetes mellitus with moderate nonproliferative diabetic retinopathy with macular edema, right eye: Secondary | ICD-10-CM | POA: Diagnosis not present

## 2023-05-26 ENCOUNTER — Ambulatory Visit
Admission: RE | Admit: 2023-05-26 | Discharge: 2023-05-26 | Disposition: A | Discharge status: Home / Self Care | Payer: PPO | Source: Ambulatory Visit | Attending: Internal Medicine | Admitting: Internal Medicine

## 2023-05-26 DIAGNOSIS — Z1231 Encounter for screening mammogram for malignant neoplasm of breast: Secondary | ICD-10-CM | POA: Diagnosis not present

## 2023-05-30 DIAGNOSIS — E113311 Type 2 diabetes mellitus with moderate nonproliferative diabetic retinopathy with macular edema, right eye: Secondary | ICD-10-CM | POA: Diagnosis not present

## 2023-05-30 DIAGNOSIS — E113312 Type 2 diabetes mellitus with moderate nonproliferative diabetic retinopathy with macular edema, left eye: Secondary | ICD-10-CM | POA: Diagnosis not present

## 2023-05-30 DIAGNOSIS — H35073 Retinal telangiectasis, bilateral: Secondary | ICD-10-CM | POA: Diagnosis not present

## 2023-05-30 DIAGNOSIS — H401131 Primary open-angle glaucoma, bilateral, mild stage: Secondary | ICD-10-CM | POA: Diagnosis not present

## 2023-05-30 LAB — HM DIABETES EYE EXAM

## 2023-05-31 ENCOUNTER — Encounter: Payer: Self-pay | Admitting: Internal Medicine

## 2023-06-01 ENCOUNTER — Encounter: Payer: Self-pay | Admitting: Internal Medicine

## 2023-06-01 LAB — HM MAMMOGRAPHY

## 2023-06-20 DIAGNOSIS — H401131 Primary open-angle glaucoma, bilateral, mild stage: Secondary | ICD-10-CM | POA: Diagnosis not present

## 2023-06-20 DIAGNOSIS — H35073 Retinal telangiectasis, bilateral: Secondary | ICD-10-CM | POA: Diagnosis not present

## 2023-06-20 DIAGNOSIS — E113311 Type 2 diabetes mellitus with moderate nonproliferative diabetic retinopathy with macular edema, right eye: Secondary | ICD-10-CM | POA: Diagnosis not present

## 2023-06-20 DIAGNOSIS — E113312 Type 2 diabetes mellitus with moderate nonproliferative diabetic retinopathy with macular edema, left eye: Secondary | ICD-10-CM | POA: Diagnosis not present

## 2023-07-04 ENCOUNTER — Ambulatory Visit: Payer: PPO | Admitting: Adult Health

## 2023-07-14 ENCOUNTER — Other Ambulatory Visit: Payer: Self-pay | Admitting: Internal Medicine

## 2023-07-14 MED ORDER — GLIMEPIRIDE 4 MG PO TABS: ORAL_TABLET | ORAL | 3 refills | Status: DC

## 2023-07-14 MED ORDER — AMLODIPINE BESYLATE 10 MG PO TABS: 10.0000 mg | ORAL_TABLET | Freq: Every day | ORAL | 3 refills | Status: DC

## 2023-07-14 MED ORDER — METFORMIN HCL 1000 MG PO TABS
1000.0000 mg | ORAL_TABLET | Freq: Two times a day (BID) | ORAL | 3 refills | Status: AC
Start: 2023-07-14 — End: ?

## 2023-07-14 NOTE — Telephone Encounter (Signed)
 Copied from CRM [id number]. Topic: Clinical - Medication Refill >> Jul 14, 2023  9:53 AM Lennart Pall wrote: Most Recent Primary Care Visit:  Provider: Hillard Danker A  Department: [facility]  Visit Type: OFFICE VISIT  Date: 01/31/2023  Medication: amLODipine (NORVASC) 10 MG tablet ; glimepiride (AMARYL) 4 MG tablet ; metFORMIN (GLUCOPHAGE) 1000 MG tablet   Has the patient contacted their pharmacy? Yes (Agent: If no, request that the patient contact the pharmacy for the refill. If patient does not wish to contact the pharmacy document the reason why and proceed with request.) (Agent: If yes, when and what did the pharmacy advise?)  Is this the correct pharmacy for this prescription? Yes If no, delete pharmacy and type the correct one.  This is the patient's preferred pharmacy:  [facility] [employer], [place] - [address] [place] [address] [place] [address] Phone: [phone] Fax: [phone]    Has the prescription been filled recently? Yes  Is the patient out of the medication? Yes  Has the patient been seen for an appointment in the last year OR does the patient have an upcoming appointment? Yes  Can we respond through MyChart? Yes  Agent: Please be advised that Rx refills may take up to 3 business days. We ask that you follow-up with your pharmacy.

## 2023-07-18 ENCOUNTER — Ambulatory Visit (INDEPENDENT_AMBULATORY_CARE_PROVIDER_SITE_OTHER)

## 2023-07-18 VITALS — Ht 61.0 in | Wt 138.0 lb

## 2023-07-18 DIAGNOSIS — Z0000 Encounter for general adult medical examination without abnormal findings: Secondary | ICD-10-CM | POA: Diagnosis not present

## 2023-07-18 DIAGNOSIS — Z780 Asymptomatic menopausal state: Secondary | ICD-10-CM | POA: Diagnosis not present

## 2023-07-18 NOTE — Patient Instructions (Addendum)
 Ms. Savona , Thank you for taking time to come for your Medicare Wellness Visit. I appreciate your ongoing commitment to your health goals. Please review the following plan we discussed and let me know if I can assist you in the future.   Referrals/Orders/Follow-Ups/Clinician Recommendations: It was nice talking with you today.  You are due for a Shingles vaccine and a Tetanus vaccine.  You have an order for:   [x]   Bone Density     Please call for appointment:  [facility] [address] [place], [place] [address] [phone]  Make sure to wear two-piece clothing.  No lotions, powders, or deodorants the day of the appointment. Make sure to bring picture ID and insurance card.  Bring list of medications you are currently taking including any supplements.    This is a list of the screening recommended for you and due dates:  Health Maintenance  Topic Date Due   Zoster (Shingles) Vaccine (1 of 2) Never done   DTaP/Tdap/Td vaccine (3 - Td or Tdap) 08/22/2021   COVID-19 Vaccine (5 - 2024-25 season) 12/12/2022   Complete foot exam   07/22/2023   Hemoglobin A1C  08/01/2023   Flu Shot  11/11/2023   Eye exam for diabetics  05/29/2024   Medicare Annual Wellness Visit  07/17/2024   Pneumonia Vaccine  Completed   DEXA scan (bone density measurement)  Completed   HPV Vaccine  Aged Out    Advanced directives: (Copy Requested) Please bring a copy of your health care power of attorney and living will to the office to be added to your chart at your convenience. You can mail to [facility] [address] [place], [place] [address] or email to [email or web address]  Patient stated that she is working on this now.  Next Medicare Annual Wellness Visit scheduled for next year: Yes

## 2023-07-18 NOTE — Progress Notes (Signed)
 Subjective:   Whitney [name] is a 89 y.o. who presents for a Medicare Wellness preventive visit.  Visit Complete: Virtual I connected with  Whitney [name] on 07/18/23 by a video and audio enabled telemedicine application and verified that I am speaking with the correct person using two identifiers.  Patient Location: Home  Provider Location: Other:  out of town-per pt  I discussed the limitations of evaluation and management by telemedicine. The patient expressed understanding and agreed to proceed.  Vital Signs: Because this visit was a virtual/telehealth visit, some criteria may be missing or patient reported. Any vitals not documented were not able to be obtained and vitals that have been documented are patient reported.   Persons Participating in Visit: Patient.  AWV Questionnaire: No: Patient Medicare AWV questionnaire was not completed prior to this visit.  Cardiac Risk Factors include: advanced age (>67men, >45 women);Other (see comment);diabetes mellitus;dyslipidemia;hypertension, Risk factor comments: OSA     Objective:    Today's Vitals   07/18/23 1053  Weight: 138 lb (62.6 kg)  Height: 5\' 1"  (1.549 m)  PainSc: 8    Body mass index is 26.07 kg/m.     07/18/2023   11:09 AM 07/28/2021    5:12 PM 01/09/2021    9:23 AM 09/19/2018    2:27 PM 04/25/2018    3:59 PM 04/21/2017    8:42 AM 04/15/2016    8:58 AM  Advanced Directives  Does Patient Have a Medical Advance Directive? No No No Yes No Yes Yes  Type of Theme park manager;Living will  Healthcare Power of [place];Living will Living will;Healthcare Power of Attorney  Does patient want to make changes to medical advance directive?       No - Patient declined  Copy of Healthcare Power of Attorney in Chart?      No - copy requested No - copy requested  Would patient like information on creating a medical advance directive?  No - Patient declined No - Patient declined  Yes (ED -  Information included in AVS)      Current Medications (verified) Outpatient Encounter Medications as of 07/18/2023  Medication Sig   amLODipine (NORVASC) 10 MG tablet Take 1 tablet (10 mg total) by mouth daily.   Ascorbic Acid (VITAMIN C) 100 MG tablet Take 100 mg by mouth daily.   aspirin 81 MG tablet Take 1 tablet (81 mg total) by mouth daily.   blood glucose meter kit and supplies KIT Use to test blood sugar up to 3 times daily. DX E11.9   Blood Glucose Monitoring Suppl DEVI 1 each by Does not apply route in the morning, at noon, and at bedtime. May substitute to any manufacturer covered by patient's insurance.   Calcium Carb-Cholecalciferol (CALCIUM-VITAMIN D) 500-200 MG-UNIT tablet Take 1 tablet by mouth 2 (two) times daily with a meal.   diclofenac Sodium (VOLTAREN) 1 % GEL Apply 4 g topically 4 (four) times daily.   donepezil (ARICEPT) 10 MG tablet Take 1 tablet (10 mg total) by mouth at bedtime.   dorzolamide-timolol (COSOPT) 2-0.5 % ophthalmic solution Instill 1 drop in each eye twice a day   DUREZOL 0.05 % EMUL    empagliflozin (JARDIANCE) 25 MG TABS tablet Take 1 tablet (25 mg total) by mouth daily before breakfast.   glimepiride (AMARYL) 4 MG tablet TAKE 1 TABLET EVERY DAY BEFORE BREAKFAST   glucose blood (ONETOUCH VERIO) test strip Use to test blood sugar 3 times a  day   Lancet Devices [facility]) lancets Use to test blood sugar up to 3 times a day. DX E11.9   lisinopril-hydrochlorothiazide (ZESTORETIC) 20-25 MG tablet Take 1 tablet by mouth daily.   metFORMIN (GLUCOPHAGE) 1000 MG tablet Take 1 tablet (1,000 mg total) by mouth 2 (two) times daily with a meal.   Multiple Vitamin (MULTI VITAMIN DAILY PO) Take one by mouth daily   simvastatin (ZOCOR) 20 MG tablet Take 1 tablet (20 mg total) by mouth at bedtime.   zinc gluconate 50 MG tablet Take 50 mg by mouth daily.   No facility-administered encounter medications on file as of 07/18/2023.    Allergies (verified) Patient  has no known allergies.   History: Past Medical History:  Diagnosis Date   Anxiety    Baker's cyst    Chest pain    Diabetes mellitus type 2, uncontrolled    Diabetes mellitus without complication (HCC)    Disc disease, degenerative, cervical    Diverticulosis of colon    DJD (degenerative joint disease) of knee    Facial pain, atypical    GERD (gastroesophageal reflux disease)    Glaucoma    Hypercholesteremia    Hypertension    Lumbar spondylosis    Obesity    Onychomycosis    Osteopenia    Past Surgical History:  Procedure Laterality Date   right eye surgery  2010/09/30   retina in right eye   VAGINAL HYSTERECTOMY  2000/07/30   Dr. Donny Pique repair   Family History  Problem Relation Age of Onset   Diabetes Mother    Colon cancer Neg Hx    Stomach cancer Neg Hx    Social History   Socioeconomic History   Marital status: Widowed    Spouse name: deceased 29-Sep-2008   Number of children: Not on file   Years of education: Not on file   Highest education level: Not on file  Occupational History   Occupation: Retired  Tobacco Use   Smoking status: Never   Smokeless tobacco: Never  Vaping Use   Vaping status: Never Used  Substance and Sexual Activity   Alcohol use: No   Drug use: No   Sexual activity: Never  Other Topics Concern   Not on file  Social History Narrative   Lives alone   Spouse passed away 07-30-2008   Son;    3 girls   Clinical cytogeneticist;    Social Drivers of Corporate investment banker Strain: Low Risk  (02/18/2020)   Overall Financial Resource Strain (CARDIA)    Difficulty of Paying Living Expenses: Not hard at all  Food Insecurity: Patient Declined (10/27/2022)   Hunger Vital Sign    Worried About Running Out of Food in the Last Year: Patient declined    Ran Out of Food in the Last Year: Patient declined  Transportation Needs: No Transportation Needs (07/18/2023)   PRAPARE - Administrator, Civil Service (Medical): No    Lack of Transportation  (Non-Medical): No  Physical Activity: Insufficiently Active (07/18/2023)   Exercise Vital Sign    Days of Exercise per Week: 4 days    Minutes of Exercise per Session: 20 min  Stress: No Stress Concern Present (07/18/2023)   [employer] of Occupational Health - Occupational Stress Questionnaire    Feeling of Stress : Not at all  Social Connections: Moderately Integrated (07/18/2023)   Social Connection and Isolation Panel [NHANES]    Frequency of Communication with Friends and Family: More  than three times a week    Frequency of Social Gatherings with Friends and Family: Three times a week    Attends Religious Services: More than 4 times per year    Active Member of Clubs or Organizations: Yes    Attends Banker Meetings: Never    Marital Status: Widowed    Tobacco Counseling Counseling given: Not Answered    Clinical Intake:  Pre-visit preparation completed: Yes  Pain : 0-10 Pain Score: 8  Pain Type: Acute pain Pain Location: Knee (Rt ankle) Pain Orientation: Right Pain Descriptors / Indicators: Aching, Discomfort Pain Onset: 1 to 4 weeks ago Pain Frequency: Constant Pain Relieving Factors: prednisone (pt not sure of doseage, due to her being out of town). Effect of Pain on Daily Activities: When standing and walking  Pain Relieving Factors: prednisone (pt not sure of doseage, due to her being out of town).  BMI - recorded: 26.07 Nutritional Status: BMI 25 -29 Overweight Nutritional Risks: None Diabetes: Yes CBG done?: No Did pt. bring in CBG monitor from home?: No  Lab Results  Component Value Date   HGBA1C 7.3 (H) 01/31/2023   HGBA1C 7.4 10/27/2022   HGBA1C 7.9 (H) 07/22/2022     How often do you need to have someone help you when you read instructions, pamphlets, or other written materials from your doctor or pharmacy?: 2 - Rarely  Interpreter Needed?: No  Information entered by :: Kameo Bains, RMA   Activities of Daily Living      07/18/2023   10:53 AM  In your present state of health, do you have any difficulty performing the following activities:  Hearing? 1  Comment hearing aides ([facility])  Vision? 0  Difficulty concentrating or making decisions? 0  Walking or climbing stairs? 0  Dressing or bathing? 0  Doing errands, shopping? 0  Comment her son takes her around  Preparing Food and eating ? N  Using the Toilet? N  In the past six months, have you accidently leaked urine? N  Do you have problems with loss of bowel control? N  Managing your Medications? N  Managing your Finances? N  Housekeeping or managing your Housekeeping? N    Patient Care Team: Myrlene Broker, MD as PCP - General (Internal Medicine) Michele Mcalpine, MD as Consulting Physician (Pulmonary Disease) Tuchman, Fanny Bien, DPM (Inactive) as Consulting Physician (Podiatry) Szabat, Vinnie Level, [facility] (Inactive) (Pharmacist)  Indicate any recent Medical Services you may have received from other than [facility] providers in the past year (date may be approximate).     Assessment:   This is a routine wellness examination for [place].  Hearing/Vision screen Vision Screening - Comments:: Wears eyeglasses   Goals Addressed   None    Depression Screen     07/18/2023   11:16 AM 04/27/2022    9:04 AM 07/24/2021   11:30 AM 06/15/2021   11:40 AM 01/09/2021    9:22 AM 10/22/2020   10:26 AM 10/08/2019   10:39 AM  PHQ 2/9 Scores  PHQ - 2 Score 0 0 0 0 0 0 0  PHQ- 9 Score 0 0         Fall Risk     07/18/2023   11:10 AM 01/31/2023    8:11 AM 12/15/2022   11:06 AM 07/22/2022    9:13 AM 04/27/2022    9:04 AM  Fall Risk   Falls in the past year? 0 0 0 0 0  Number falls in past yr:  0 0 0 0 0  Injury with Fall? 0 0 0 0 0  Risk for fall due to : No Fall Risks      Follow up Falls prevention discussed;Falls evaluation completed Falls evaluation completed Falls evaluation completed Falls evaluation completed Falls evaluation completed    MEDICARE RISK AT HOME:   Medicare Risk at Home Any stairs in or around the home?: No Home free of loose throw rugs in walkways, pet beds, electrical cords, etc?: Yes Adequate lighting in your home to reduce risk of falls?: Yes Life alert?: No Use of a cane, walker or w/c?: No Grab bars in the bathroom?: Yes Shower chair or bench in shower?: Yes Elevated toilet seat or a handicapped toilet?: Yes  TIMED UP AND GO:  Was the test performed?  No  Cognitive Function: 6CIT completed    04/21/2017    8:56 AM 08/30/2014    9:18 AM  MMSE - Mini Mental State Exam  Not completed:  Unable to complete  Orientation to time 5   Orientation to Place 5   Registration 3   Attention/ Calculation 4   Recall 2   Language- name 2 objects 2   Language- repeat 1   Language- follow 3 step command 3   Language- read & follow direction 1   Write a sentence 1   Copy design 1   Total score 28         07/18/2023   11:10 AM  6CIT Screen  What Year? 0 points  What month? 0 points  What time? 0 points  Count back from 20 0 points  Months in reverse 2 points  Repeat phrase 0 points  Total Score 2 points    Immunizations Immunization History  Administered Date(s) Administered   Fluad Quad(high Dose 65+) 03/30/2019, 03/17/2020, 01/20/2022   Fluad Trivalent(High Dose 65+) 01/31/2023   Influenza Split 02/18/2011, 02/23/2012   Influenza Whole 01/19/2005, 02/17/2009, 02/16/2010   Influenza, High Dose Seasonal PF 02/14/2015, 04/21/2017, 02/02/2018   Influenza,inj,Quad PF,6+ Mos 03/12/2013, 02/07/2014   PFIZER(Purple Top)SARS-COV-2 Vaccination 05/04/2019, 05/28/2019, 01/29/2020   Pfizer Covid-19 Vaccine Bivalent Booster 67yrs & up 03/03/2021   Pneumococcal Conjugate-13 05/25/2016   Pneumococcal Polysaccharide-23 04/12/1998, 02/16/2010   Td 08/21/2002   Tdap 08/23/2011    Screening Tests Health Maintenance  Topic Date Due   Zoster Vaccines- Shingrix (1 of 2) Never done   DTaP/Tdap/Td (3 - Td or Tdap) 08/22/2021    COVID-19 Vaccine (5 - 2024-25 season) 12/12/2022   FOOT EXAM  07/22/2023   HEMOGLOBIN A1C  08/01/2023   INFLUENZA VACCINE  11/11/2023   OPHTHALMOLOGY EXAM  05/29/2024   Medicare Annual Wellness (AWV)  07/17/2024   Pneumonia Vaccine 5+ Years old  Completed   DEXA SCAN  Completed   HPV VACCINES  Aged Out    Health Maintenance  Health Maintenance Due  Topic Date Due   Zoster Vaccines- Shingrix (1 of 2) Never done   DTaP/Tdap/Td (3 - Td or Tdap) 08/22/2021   COVID-19 Vaccine (5 - 2024-25 season) 12/12/2022   Health Maintenance Items Addressed: DEXA ordered, See Nurse Notes  Additional Screening:  Vision Screening: Recommended annual ophthalmology exams for early detection of glaucoma and other disorders of the eye.  Dental Screening: Recommended annual dental exams for proper oral hygiene  Community Resource Referral / Chronic Care Management: CRR required this visit?  No   CCM required this visit?  No     Plan:     I have personally  reviewed and noted the following in the patient's chart:   Medical and social history Use of alcohol, tobacco or illicit drugs  Current medications and supplements including opioid prescriptions. Patient is not currently taking opioid prescriptions. Functional ability and status Nutritional status Physical activity Advanced directives List of other physicians Hospitalizations, surgeries, and ER visits in previous 12 months Vitals Screenings to include cognitive, depression, and falls Referrals and appointments  In addition, I have reviewed and discussed with patient certain preventive protocols, quality metrics, and best practice recommendations. A written personalized care plan for preventive services as well as general preventive health recommendations were provided to patient.     Jalayia Bagheri L Tashea Othman, CMA   07/18/2023   After Visit Summary: (MyChart) Due to this being a telephonic visit, the after visit summary with patients  personalized plan was offered to patient via MyChart   Notes: Please refer to Routing Comments.

## 2023-07-28 DIAGNOSIS — H401131 Primary open-angle glaucoma, bilateral, mild stage: Secondary | ICD-10-CM | POA: Diagnosis not present

## 2023-07-28 DIAGNOSIS — E113312 Type 2 diabetes mellitus with moderate nonproliferative diabetic retinopathy with macular edema, left eye: Secondary | ICD-10-CM | POA: Diagnosis not present

## 2023-07-28 DIAGNOSIS — E113311 Type 2 diabetes mellitus with moderate nonproliferative diabetic retinopathy with macular edema, right eye: Secondary | ICD-10-CM | POA: Diagnosis not present

## 2023-07-28 DIAGNOSIS — H35073 Retinal telangiectasis, bilateral: Secondary | ICD-10-CM | POA: Diagnosis not present

## 2023-07-28 LAB — HM DIABETES EYE EXAM

## 2023-08-01 ENCOUNTER — Ambulatory Visit: Payer: PPO | Admitting: Internal Medicine

## 2023-08-01 IMAGING — MG MM DIGITAL SCREENING BILAT W/ TOMO AND CAD
8 series · 8 of 24 positions shown · non-contrast
Comparison: Previous exam(s).

CLINICAL DATA: Screening.

EXAM:
DIGITAL SCREENING BILATERAL MAMMOGRAM WITH TOMOSYNTHESIS AND CAD
TECHNIQUE: Bilateral screening digital craniocaudal and mediolateral oblique
mammograms were obtained. Bilateral screening digital breast
tomosynthesis was performed. The images were evaluated with
computer-aided detection.

[R CC synth-2D]
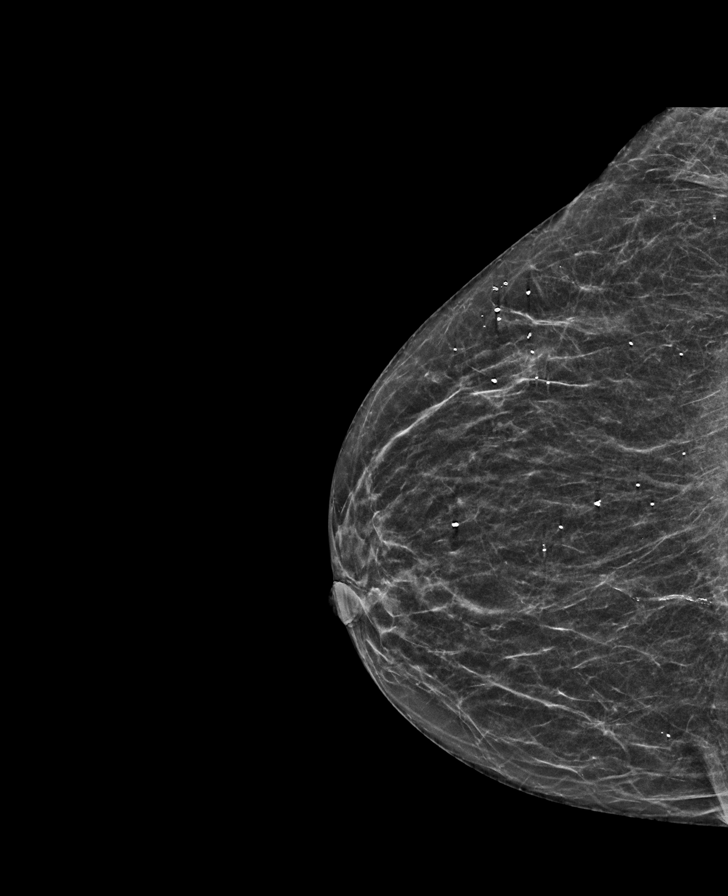

[L CC synth-2D]
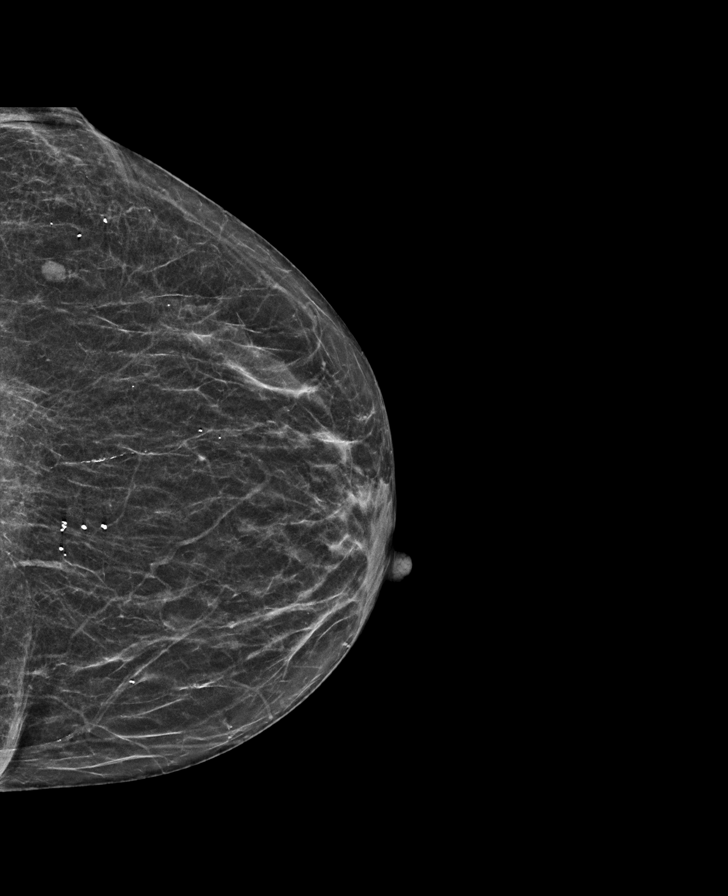

[R MLO synth-2D]
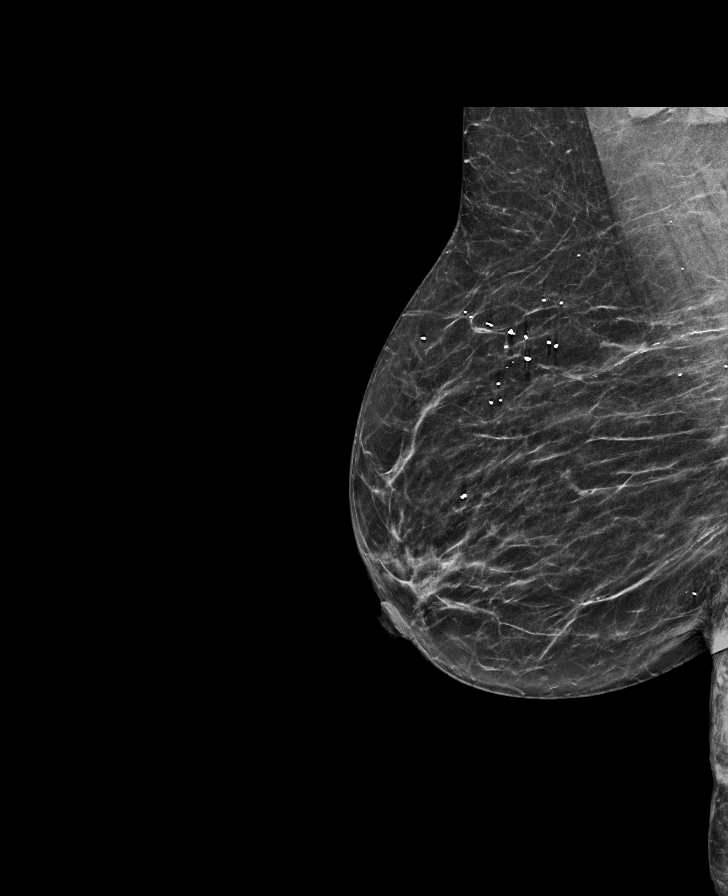

[L MLO synth-2D]
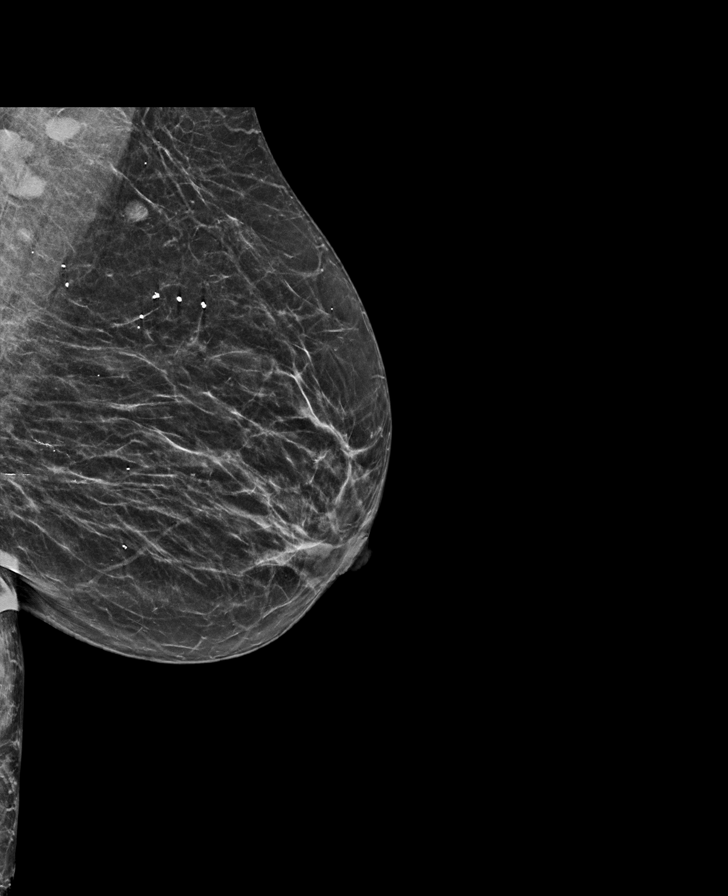

[R MLO tomo · tomo slice 28/55.0]
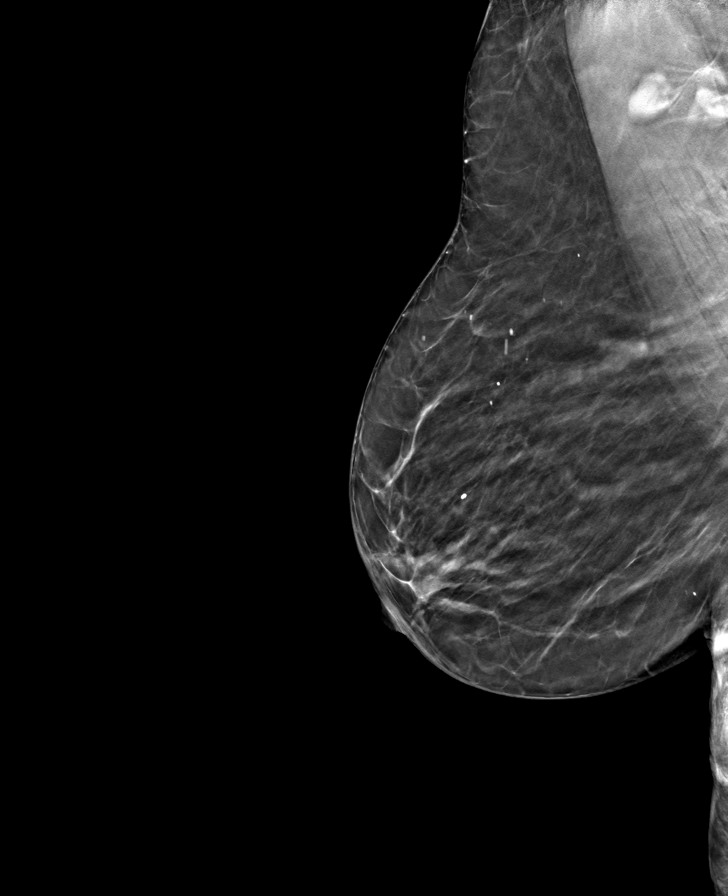

[R CC tomo · tomo slice 24/47.0]
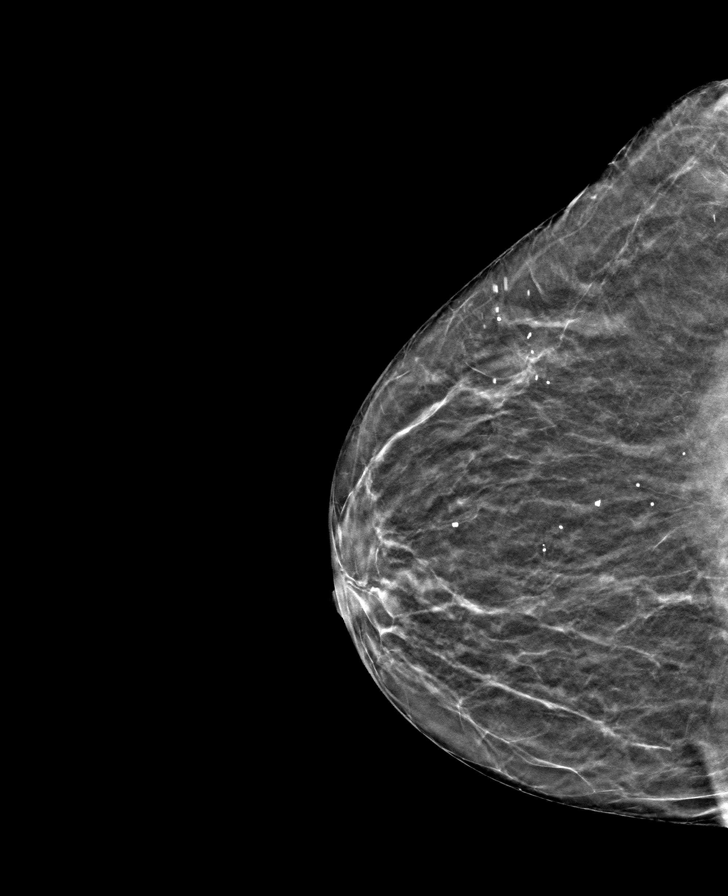

[L CC tomo · tomo slice 25/48.0]
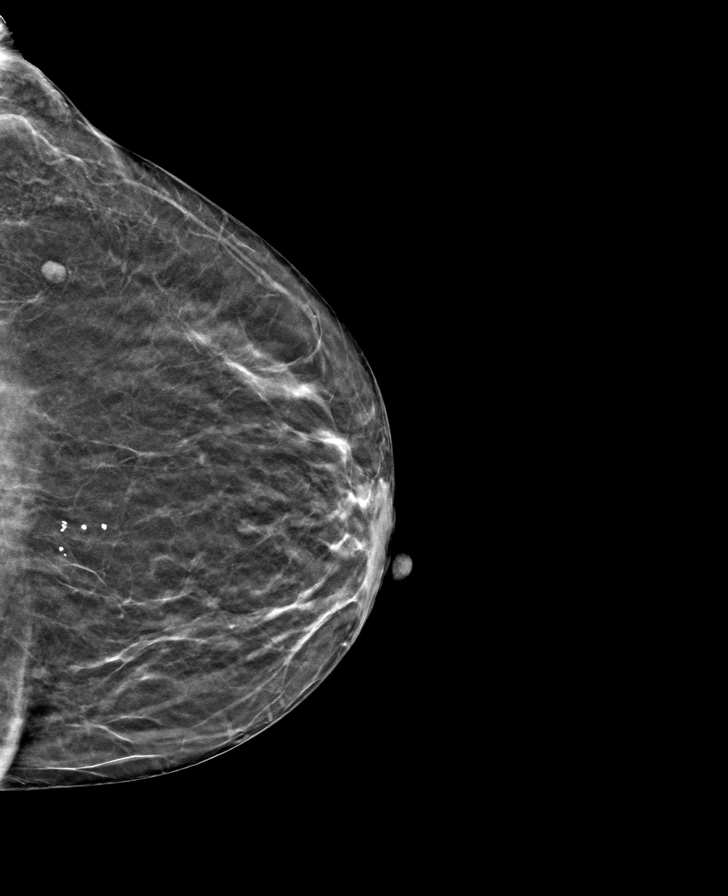

[L MLO tomo · tomo slice 28/55.0]
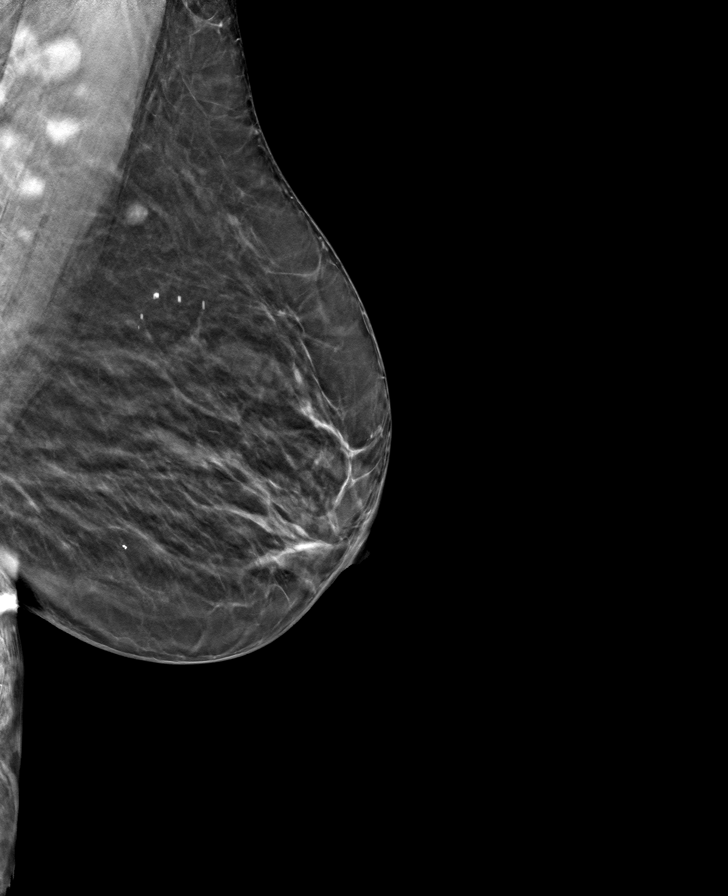

[8 of 24 positions shown; findings below may reference images not displayed]

ACR Breast Density Category b: There are scattered areas of
fibroglandular density.
FINDINGS: There are no findings suspicious for malignancy.
IMPRESSION: No mammographic evidence of malignancy. A result letter of this
screening mammogram will be mailed directly to the patient.

RECOMMENDATION:
Screening mammogram in one year. (Code:51-O-LD2)

BI-RADS CATEGORY  1: Negative.

## 2023-08-03 ENCOUNTER — Ambulatory Visit (INDEPENDENT_AMBULATORY_CARE_PROVIDER_SITE_OTHER): Admitting: Internal Medicine

## 2023-08-03 ENCOUNTER — Encounter: Payer: Self-pay | Admitting: Internal Medicine

## 2023-08-03 VITALS — BP 100/60 | HR 78 | Temp 97.6°F | Ht 61.0 in | Wt 135.0 lb

## 2023-08-03 DIAGNOSIS — E113311 Type 2 diabetes mellitus with moderate nonproliferative diabetic retinopathy with macular edema, right eye: Secondary | ICD-10-CM | POA: Diagnosis not present

## 2023-08-03 DIAGNOSIS — E113312 Type 2 diabetes mellitus with moderate nonproliferative diabetic retinopathy with macular edema, left eye: Secondary | ICD-10-CM

## 2023-08-03 DIAGNOSIS — I10 Essential (primary) hypertension: Secondary | ICD-10-CM

## 2023-08-03 DIAGNOSIS — E785 Hyperlipidemia, unspecified: Secondary | ICD-10-CM

## 2023-08-03 DIAGNOSIS — E1169 Type 2 diabetes mellitus with other specified complication: Secondary | ICD-10-CM | POA: Diagnosis not present

## 2023-08-03 DIAGNOSIS — Z0000 Encounter for general adult medical examination without abnormal findings: Secondary | ICD-10-CM | POA: Diagnosis not present

## 2023-08-03 DIAGNOSIS — R413 Other amnesia: Secondary | ICD-10-CM | POA: Diagnosis not present

## 2023-08-03 DIAGNOSIS — Z7984 Long term (current) use of oral hypoglycemic drugs: Secondary | ICD-10-CM

## 2023-08-03 LAB — HEMOGLOBIN A1C: Hgb A1c MFr Bld: 8 % — ABNORMAL HIGH (ref 4.6–6.5)

## 2023-08-03 LAB — COMPREHENSIVE METABOLIC PANEL WITH GFR
ALT: 22 U/L (ref 0–35)
AST: 21 U/L (ref 0–37)
Albumin: 4 g/dL (ref 3.5–5.2)
Alkaline Phosphatase: 49 U/L (ref 39–117)
BUN: 11 mg/dL (ref 6–23)
CO2: 31 meq/L (ref 19–32)
Calcium: 9.9 mg/dL (ref 8.4–10.5)
Chloride: 101 meq/L (ref 96–112)
Creatinine, Ser: 0.86 mg/dL (ref 0.40–1.20)
GFR: 61.14 mL/min (ref >60.00)
Glucose, Bld: 150 mg/dL — ABNORMAL HIGH (ref 70–99)
Potassium: 3.7 meq/L (ref 3.5–5.1)
Sodium: 138 meq/L (ref 135–145)
Total Bilirubin: 0.8 mg/dL (ref 0.2–1.2)
Total Protein: 7.6 g/dL (ref 6.0–8.3)

## 2023-08-03 LAB — CBC
HCT: 38.9 % (ref 36.0–46.0)
Hemoglobin: 12.6 g/dL (ref 12.0–15.0)
MCHC: 32.4 g/dL (ref 30.0–36.0)
MCV: 77.8 fl — ABNORMAL LOW (ref 78.0–100.0)
Platelets: 233 10*3/uL (ref 150.0–400.0)
RBC: 5 Mil/uL (ref 3.87–5.11)
RDW: 16.1 % — ABNORMAL HIGH (ref 11.5–15.5)
WBC: 5.7 10*3/uL (ref 4.0–10.5)

## 2023-08-03 LAB — MICROALBUMIN / CREATININE URINE RATIO
Creatinine,U: 64.2 mg/dL
Microalb Creat Ratio: UNDETERMINED mg/g (ref 0.0–30.0)
Microalb, Ur: <0.7 mg/dL

## 2023-08-03 LAB — LIPID PANEL
Cholesterol: 198 mg/dL (ref 0–200)
HDL: 48.3 mg/dL (ref >39.00)
LDL Cholesterol: 131 mg/dL — ABNORMAL HIGH (ref 0–99)
NonHDL: 150.06
Total CHOL/HDL Ratio: 4
Triglycerides: 96 mg/dL (ref 0.0–149.0)
VLDL: 19.2 mg/dL (ref 0.0–40.0)

## 2023-08-03 MED ORDER — SIMVASTATIN 20 MG PO TABS: 20.0000 mg | ORAL_TABLET | Freq: Every day | ORAL | 3 refills | Status: AC

## 2023-08-03 MED ORDER — EMPAGLIFLOZIN 25 MG PO TABS: 25.0000 mg | ORAL_TABLET | Freq: Every day | ORAL | 3 refills | Status: DC

## 2023-08-03 NOTE — Progress Notes (Unsigned)
   Subjective:   Patient ID: Whitney [name], female    DOB: Dec 15, 1934, 89 y.o.   MRN: [id number]  HPI The patient is here for physical.  {Perform Simple Foot Exam  Perform Detailed exam:1} Diabetic foot exam was performed with the following findings:   No deformities, ulcerations, or other skin breakdown Normal sensation of 10g monofilament Intact posterior tibialis and dorsalis pedis pulses      PMH, FMH, social history reviewed and updated  Review of Systems  Constitutional: Negative.   HENT: Negative.    Eyes: Negative.   Respiratory:  Negative for cough, chest tightness and shortness of breath.   Cardiovascular:  Negative for chest pain, palpitations and leg swelling.  Gastrointestinal:  Negative for abdominal distention, abdominal pain, constipation, diarrhea, nausea and vomiting.  Musculoskeletal:  Positive for back pain.  Skin: Negative.   Neurological: Negative.   Psychiatric/Behavioral: Negative.      Objective:  Physical Exam Constitutional:      Appearance: She is well-developed.  HENT:     Head: Normocephalic and atraumatic.  Cardiovascular:     Rate and Rhythm: Normal rate and regular rhythm.  Pulmonary:     Effort: Pulmonary effort is normal. No respiratory distress.     Breath sounds: Normal breath sounds. No wheezing or rales.  Abdominal:     General: Bowel sounds are normal. There is no distension.     Palpations: Abdomen is soft.     Tenderness: There is no abdominal tenderness. There is no rebound.  Musculoskeletal:     Cervical back: Normal range of motion.  Skin:    General: Skin is warm and dry.     Comments: Foot exam done  Neurological:     Mental Status: She is alert and oriented to person, place, and time.     Coordination: Coordination normal.     Vitals:   08/03/23 0921  BP: 100/60  Pulse: 78  Temp: 97.6 F (36.4 C)  TempSrc: Oral  SpO2: 99%  Weight: 135 lb (61.2 kg)  Height: 5\' 1"  (1.549 m)    Assessment & Plan:

## 2023-08-04 NOTE — Assessment & Plan Note (Signed)
Checking lipid panel and adjust simvastatin as needed.  

## 2023-08-04 NOTE — Assessment & Plan Note (Signed)
 Taking aricept  and overall stable. Continue

## 2023-08-04 NOTE — Assessment & Plan Note (Signed)
 Taking injections still and stable per her reports.

## 2023-08-04 NOTE — Assessment & Plan Note (Signed)
 Taking injections and stable per patient.

## 2023-08-04 NOTE — Assessment & Plan Note (Signed)
Flu shot yearly. Pneumonia complete. Shingrix due at pharmacy. Tetanus due at pharmacy. Colonoscopy aged out. Mammogram aged out, pap smear aged out and dexa complete. Counseled about sun safety and mole surveillance. Counseled about the dangers of distracted driving. Given 10 year screening recommendations.   

## 2023-08-04 NOTE — Assessment & Plan Note (Signed)
 Checking HgA1c, foot exam, UACR, CMP, lipid panel and adjust jardiance  and amaryl  and metformin  as needed. On ACE-I and statin.

## 2023-08-04 NOTE — Assessment & Plan Note (Signed)
 BP at goal on lisinopril /hydrochlorothiazide  20/25 and amlodipine  10 mg daily. Checking CMP and adjust as needed.

## 2023-08-08 ENCOUNTER — Other Ambulatory Visit: Payer: Self-pay | Admitting: Internal Medicine

## 2023-08-08 ENCOUNTER — Encounter: Payer: Self-pay | Admitting: Internal Medicine

## 2023-08-08 ENCOUNTER — Ambulatory Visit: Payer: Self-pay

## 2023-08-08 DIAGNOSIS — E113312 Type 2 diabetes mellitus with moderate nonproliferative diabetic retinopathy with macular edema, left eye: Secondary | ICD-10-CM | POA: Diagnosis not present

## 2023-08-08 DIAGNOSIS — H35073 Retinal telangiectasis, bilateral: Secondary | ICD-10-CM | POA: Diagnosis not present

## 2023-08-08 DIAGNOSIS — H401131 Primary open-angle glaucoma, bilateral, mild stage: Secondary | ICD-10-CM | POA: Diagnosis not present

## 2023-08-08 DIAGNOSIS — E113311 Type 2 diabetes mellitus with moderate nonproliferative diabetic retinopathy with macular edema, right eye: Secondary | ICD-10-CM | POA: Diagnosis not present

## 2023-08-08 NOTE — Telephone Encounter (Signed)
 Chief Complaint: lab results Symptoms: lab results, leg swelling Frequency: 1 month Pertinent Negatives: Patient denies discoloration, pain on palpitation, CP, SOB, palpitations, heat, fever/chills Disposition: [] ED /[] Urgent Care (no appt availability in office) / [] Appointment(In office/virtual)/ []  [facility]/ [] Home Care/ [] Refused Recommended Disposition /[] [facility]/ [x]  Follow-up with PCP Additional Notes:  Pt has questions about her lab results. RN shared with pt provider's communication about her labs.  Pt's A1C is 8.0. Pt is concerned, especially since she is taking metformin , Jardiance , and Amaryl . Pt would appreciate follow-up from Dr. Nicolette Barrio about how she can better control her diabetes. Pt believes she may need an endocrinologist to better help control her diabetes.  In regards to pt's cholesterol, pt states she has not taken her simvastatin  in 1 month because she did not have it. Chart review shows receipt confirmed by pharmacy 4/23. Pt states she has called her pharmacy but has not been able to get in touch. RN called pharmacy who said Jardiance  and simvastatin  were shipped out 4/25. RN advised pt the medications were shipped.  Pt also states she needs more OneTouch Verio test strips. Pended here for refill.  Pt experiencing R leg swelling to the knee. Pt was seen for same 4/23. Pt states swelling has improved but leg is still painful - 8/10 pain. Denies pain to the touch. Denies discoloration. Pt still able to walk. Denies CP or SOB. RN advised pt if her swelling worsens or stops improving she needs to call us  back. RN advised pt if she develops CP or SOB she needs to call 911.     Copied from CRM [id number]. Topic: Clinical - Medical Advice >> Aug 08, 2023  9:56 AM Rosaria Common wrote: Reason for CRM: Read pt lab results and requesting further medical advice. Answer Assessment - Initial Assessment Questions 1. ONSET: "When did the swelling start?"  (e.g., minutes, hours, days)     1 month  2. LOCATION: "What part of the leg is swollen?"  "Are both legs swollen or just one leg?"     R leg swelling up to the knee, has improved since 4/23 3. SEVERITY: "How bad is the swelling?" (e.g., localized; mild, moderate, severe)   - Localized: Small area of swelling localized to one leg.   - MILD pedal edema: Swelling limited to foot and ankle, pitting edema < 1/4 inch (6 mm) deep, rest and elevation eliminate most or all swelling.   - MODERATE edema: Swelling of lower leg to knee, pitting edema > 1/4 inch (6 mm) deep, rest and elevation only partially reduce swelling.   - SEVERE edema: Swelling extends above knee, facial or hand swelling present.      Moderate - knee down 4. REDNESS: "Does the swelling look red or infected?"     Denies redness  5. PAIN: "Is the swelling painful to touch?" If Yes, ask: "How painful is it?"   (Scale 1-10; mild, moderate or severe)     8/10 6. FEVER: "Do you have a fever?" If Yes, ask: "What is it, how was it measured, and when did it start?"      No fever 7. CAUSE: "What do you think is causing the leg swelling?"     Not sure - since 4/23 in the office for it, states it is improving  8. MEDICAL HISTORY: "Do you have a history of blood clots (e.g., DVT), cancer, heart failure, kidney disease, or liver failure?"     No hx of DVT.  Hx of HTN and venous insufficiency, type 2 DM 9. RECURRENT SYMPTOM: "Have you had leg swelling before?" If Yes, ask: "When was the last time?" "What happened that time?"     Denies that this is the first time - pt states it has happened before 10. OTHER SYMPTOMS: "Do you have any other symptoms?" (e.g., chest pain, difficulty breathing)       Pt states the skin looks normal but there is swelling. Denies discoloration. Denies pain to the touch. No CP. No SOB. Denies "a little tingling" in the toes, states it has not gotten any worse. Denies fever or chills.  Protocols used: Leg Swelling and  Edema-A-AH

## 2023-08-08 NOTE — Telephone Encounter (Signed)
 Copied from CRM [id number]. Topic: Clinical - Medication Refill >> Aug 08, 2023  9:53 AM Rosaria Common wrote: Most Recent Primary Care Visit:  Provider: Bambi Lever A  Department: [facility]  Visit Type: OFFICE VISIT  Date: 08/03/2023  Medication: simvastatin  (ZOCOR ) 20 MG tablet  Has the patient contacted their pharmacy? Yes (Agent: If no, request that the patient contact the pharmacy for the refill. If patient does not wish to contact the pharmacy document the reason why and proceed with request.) (Agent: If yes, when and what did the pharmacy advise?)  Is this the correct pharmacy for this prescription? Yes If no, delete pharmacy and type the correct one.  This is the patient's preferred pharmacy:  [facility] [employer], [place] - [address] [place] [address] [place] [address] Phone: [phone] Fax: [phone]     Has the prescription been filled recently? Yes  Is the patient out of the medication? Yes  Has the patient been seen for an appointment in the last year OR does the patient have an upcoming appointment? Yes  Can we respond through MyChart? Yes  Agent: Please be advised that Rx refills may take up to 3 business days. We ask that you follow-up with your pharmacy.

## 2023-08-09 ENCOUNTER — Other Ambulatory Visit: Payer: Self-pay | Admitting: Internal Medicine

## 2023-08-09 NOTE — Telephone Encounter (Signed)
**Note De-identified  [place] Obfuscation** Please advise 

## 2023-08-09 NOTE — Telephone Encounter (Signed)
 HgA1c is acceptable for age no changes needed to medicines. Ok to refill test strips.

## 2023-08-11 ENCOUNTER — Other Ambulatory Visit: Payer: Self-pay

## 2023-08-11 MED ORDER — ONETOUCH VERIO VI STRP: ORAL_STRIP | 0 refills | Status: DC

## 2023-08-11 NOTE — Telephone Encounter (Signed)
Sent in test strips for the patient

## 2023-08-18 DIAGNOSIS — G4733 Obstructive sleep apnea (adult) (pediatric): Secondary | ICD-10-CM | POA: Diagnosis not present

## 2023-08-22 ENCOUNTER — Telehealth: Payer: Self-pay | Admitting: Internal Medicine

## 2023-08-22 NOTE — Telephone Encounter (Unsigned)
 Copied from CRM [id number]. Topic: General - Other >> Aug 22, 2023  3:25 PM Whitney [name] wrote: Reason for CRM: patient is needing a call back regarding her medication lisinopril -hydrochlorothiazide  (ZESTORETIC ) 20-25 she wants to also know if she can get some test strips ordered as well

## 2023-08-25 ENCOUNTER — Other Ambulatory Visit: Payer: Self-pay

## 2023-08-25 DIAGNOSIS — I10 Essential (primary) hypertension: Secondary | ICD-10-CM

## 2023-08-25 MED ORDER — LISINOPRIL-HYDROCHLOROTHIAZIDE 20-25 MG PO TABS: 1.0000 | ORAL_TABLET | Freq: Every day | ORAL | 3 refills | Status: AC

## 2023-08-25 NOTE — Telephone Encounter (Signed)
 Called and spoke with patient and informed them that the script for the strips was filled 08/11/23. I confirmed with her if she wanted me to send the lisinopril  to the mail order pharmacy or her local pharmacy and she confirmed the local pharmacy.

## 2023-09-20 ENCOUNTER — Telehealth: Payer: Self-pay | Admitting: Adult Health

## 2023-09-20 ENCOUNTER — Encounter: Payer: Self-pay | Admitting: Adult Health

## 2023-09-20 ENCOUNTER — Ambulatory Visit: Admitting: Adult Health

## 2023-09-20 VITALS — BP 117/89 | HR 78 | Ht 60.0 in | Wt 139.0 lb

## 2023-09-20 DIAGNOSIS — G4733 Obstructive sleep apnea (adult) (pediatric): Secondary | ICD-10-CM

## 2023-09-20 NOTE — Telephone Encounter (Signed)
 HST- HTA pending

## 2023-09-20 NOTE — Progress Notes (Signed)
 PATIENT: Whitney [name] DOB: Nov 29, 1934  REASON FOR VISIT: follow up HISTORY FROM: patient PRIMARY NEUROLOGIST: Dr. Omar Bibber  Chief Complaint  Patient presents with   Sleep Apnea    RM 19 alone Pt is well and stable, reports no OSA/CPAP concerns. Tolerating CPAP well      HISTORY OF PRESENT ILLNESS: Today 09/20/23:  Whitney [name] is a 89 y.o. female with a history of obstructive sleep apnea on CPAP. Returns today for follow-up.  Her CPAP download indicates that she used her machine 50 out of 90 days.  Each night she is using her machine approximately 7 hours and 32 minutes.  Her residual AHI has increased to 14.3.  Pressure remains the same at 7 to 16 cm of water with EPR 3.  Her leak in the 95th percentile is 10.2.  Her pressure in the 95th percentile is 13.8.  Maximum pressure used was 14.5.  Her last home sleep test was in 2019.  She did get a new mask and that seems to be working better for her.  She returns today for an evaluation.  07/08/22: Whitney [name] is a 89 y.o. female with a history of obstructive sleep apnea on CPAP.  She returns today for follow-up.  Her download indicates that she used her machine 36 had a 90 days for compliance of 40%.  She used her machine greater than 4 hours only 15 days for compliance of 17%.  Her average usage is 3 hours and 57 minutes.  Her residual AHI is 8.6 on 7 to 16 cm of water with EPR 3.  Leak in the 95th percentile is 60.4 L/min.  Her pressure in the 95th percentile was 10.2 cm of water.  She states that her mask leaks at night and wakes her up.  Therefore she typically takes it off.  She would like to use it more consistently.    REVIEW OF SYSTEMS: Out of a complete 14 system review of symptoms, the patient complains only of the following symptoms, and all other reviewed systems are negative.  ESS 11  ALLERGIES: No Known Allergies  HOME MEDICATIONS: Outpatient Medications Prior to Visit  Medication Sig Dispense Refill    amLODipine  (NORVASC ) 10 MG tablet Take 1 tablet (10 mg total) by mouth daily. 90 tablet 3   Ascorbic Acid (VITAMIN C) 100 MG tablet Take 100 mg by mouth daily.     aspirin  81 MG tablet Take 1 tablet (81 mg total) by mouth daily. 90 tablet 1   blood glucose meter kit and supplies KIT Use to test blood sugar up to 3 times daily. DX E11.9 1 each 0   Blood Glucose Monitoring Suppl DEVI 1 each by Does not apply route in the morning, at noon, and at bedtime. May substitute to any manufacturer covered by patient's insurance. 1 each 0   Calcium  Carb-Cholecalciferol (CALCIUM -VITAMIN D ) 500-200 MG-UNIT tablet Take 1 tablet by mouth 2 (two) times daily with a meal. 180 tablet 1   diclofenac  Sodium (VOLTAREN ) 1 % GEL Apply 4 g topically 4 (four) times daily. 100 g 6   donepezil  (ARICEPT ) 10 MG tablet TAKE 1 TABLET(10 MG) BY MOUTH AT BEDTIME 90 tablet 3   dorzolamide -timolol  (COSOPT ) 2-0.5 % ophthalmic solution Instill 1 drop in each eye twice a day 10 mL 6   DUREZOL 0.05 % EMUL      empagliflozin  (JARDIANCE ) 25 MG TABS tablet Take 1 tablet (25 mg total) by mouth daily before breakfast. 90  tablet 3   glimepiride  (AMARYL ) 4 MG tablet TAKE 1 TABLET EVERY DAY BEFORE BREAKFAST 90 tablet 3   glucose blood (ONETOUCH VERIO) test strip Use to test blood sugar 3 times a day 250 each 0   Lancet Devices (ACCU-CHEK SOFTCLIX) lancets Use to test blood sugar up to 3 times a day. DX E11.9 1 each 0   lisinopril -hydrochlorothiazide  (ZESTORETIC ) 20-25 MG tablet Take 1 tablet by mouth daily. 90 tablet 3   metFORMIN  (GLUCOPHAGE ) 1000 MG tablet Take 1 tablet (1,000 mg total) by mouth 2 (two) times daily with a meal. 180 tablet 3   Multiple Vitamin (MULTI VITAMIN DAILY PO) Take one by mouth daily     simvastatin  (ZOCOR ) 20 MG tablet Take 1 tablet (20 mg total) by mouth at bedtime. 90 tablet 3   zinc gluconate 50 MG tablet Take 50 mg by mouth daily. (Patient not taking: Reported on 09/20/2023)     No facility-administered  medications prior to visit.    PAST MEDICAL HISTORY: Past Medical History:  Diagnosis Date   Anxiety    Baker's cyst    Chest pain    Diabetes mellitus type 2, uncontrolled    Diabetes mellitus without complication (HCC)    Disc disease, degenerative, cervical    Diverticulosis of colon    DJD (degenerative joint disease) of knee    Facial pain, atypical    GERD (gastroesophageal reflux disease)    Glaucoma    Hypercholesteremia    Hypertension    Lumbar spondylosis    Obesity    Onychomycosis    Osteopenia     PAST SURGICAL HISTORY: Past Surgical History:  Procedure Laterality Date   right eye surgery  10/22/2010   retina in right eye   VAGINAL HYSTERECTOMY  2000/10/21   Dr. Marlin Simmonds repair    FAMILY HISTORY: Family History  Problem Relation Age of Onset   Diabetes Mother    Colon cancer Neg Hx    Stomach cancer Neg Hx     SOCIAL HISTORY: Social History   Socioeconomic History   Marital status: Widowed    Spouse name: deceased 2008/10/21   Number of children: Not on file   Years of education: Not on file   Highest education level: Not on file  Occupational History   Occupation: Retired  Tobacco Use   Smoking status: Never   Smokeless tobacco: Never  Vaping Use   Vaping status: Never Used  Substance and Sexual Activity   Alcohol use: No   Drug use: No   Sexual activity: Never  Other Topics Concern   Not on file  Social History Narrative   Lives alone   Spouse passed away 10/21/2008   Son;    3 girls   Clinical cytogeneticist;    Social Drivers of Corporate investment banker Strain: Low Risk  (07/18/2023)   Overall Financial Resource Strain (CARDIA)    Difficulty of Paying Living Expenses: Not very hard  Food Insecurity: No Food Insecurity (07/18/2023)   Hunger Vital Sign    Worried About Running Out of Food in the Last Year: Never true    Ran Out of Food in the Last Year: Never true  Transportation Needs: No Transportation Needs (07/18/2023)   PRAPARE - Doctor, general practice (Medical): No    Lack of Transportation (Non-Medical): No  Physical Activity: Insufficiently Active (07/18/2023)   Exercise Vital Sign    Days of Exercise per Week: 4 days  Minutes of Exercise per Session: 20 min  Stress: No Stress Concern Present (07/18/2023)   [employer] of Occupational Health - Occupational Stress Questionnaire    Feeling of Stress : Not at all  Social Connections: Moderately Integrated (07/18/2023)   Social Connection and Isolation Panel [NHANES]    Frequency of Communication with Friends and Family: More than three times a week    Frequency of Social Gatherings with Friends and Family: Three times a week    Attends Religious Services: More than 4 times per year    Active Member of Clubs or Organizations: Yes    Attends Banker Meetings: Never    Marital Status: Widowed  Intimate Partner Violence: Patient Unable To Answer (07/18/2023)   Humiliation, Afraid, Rape, and Kick questionnaire    Fear of Current or Ex-Partner: Patient unable to answer    Emotionally Abused: Patient unable to answer    Physically Abused: Patient unable to answer    Sexually Abused: Patient unable to answer      PHYSICAL EXAM  Vitals:   09/20/23 1002  BP: 117/89  Pulse: 78  Weight: 139 lb (63 kg)  Height: 5' (1.524 m)    Body mass index is 27.15 kg/m.  Generalized: Well developed, in no acute distress  Chest: Lungs clear to auscultation bilaterally  Neurological examination  Mentation: Alert oriented to time, place, history taking. Follows all commands speech and language fluent Cranial nerve II-XII: Facial symmetry noted Gait and station: Gait is normal.    DIAGNOSTIC DATA (LABS, IMAGING, TESTING) - I reviewed patient records, labs, notes, testing and imaging myself where available.  Lab Results  Component Value Date   WBC 5.7 08/03/2023   HGB 12.6 08/03/2023   HCT 38.9 08/03/2023   MCV 77.8 (L) 08/03/2023   PLT 233.0 08/03/2023       Component Value Date/Time   NA 138 08/03/2023 1014   K 3.7 08/03/2023 1014   CL 101 08/03/2023 1014   CO2 31 08/03/2023 1014   GLUCOSE 150 (H) 08/03/2023 1014   BUN 11 08/03/2023 1014   CREATININE 0.86 08/03/2023 1014   CALCIUM  9.9 08/03/2023 1014   PROT 7.6 08/03/2023 1014   ALBUMIN 4.0 08/03/2023 1014   AST 21 08/03/2023 1014   ALT 22 08/03/2023 1014   ALKPHOS 49 08/03/2023 1014   BILITOT 0.8 08/03/2023 1014   GFRNONAA >60 07/28/2021 1739   GFRAA 156 02/19/2008 1103   Lab Results  Component Value Date   CHOL 198 08/03/2023   HDL 48.30 08/03/2023   LDLCALC 131 (H) 08/03/2023   TRIG 96.0 08/03/2023   CHOLHDL 4 08/03/2023   Lab Results  Component Value Date   HGBA1C 8.0 (H) 08/03/2023   No results found for: "VITAMINB12" Lab Results  Component Value Date   TSH 1.13 09/14/2013      ASSESSMENT AND PLAN 89 y.o. year old female  has a past medical history of Anxiety, Baker's cyst, Chest pain, Diabetes mellitus type 2, uncontrolled, Diabetes mellitus without complication (HCC), Disc disease, degenerative, cervical, Diverticulosis of colon, DJD (degenerative joint disease) of knee, Facial pain, atypical, GERD (gastroesophageal reflux disease), Glaucoma, Hypercholesteremia, Hypertension, Lumbar spondylosis, Obesity, Onychomycosis, and Osteopenia. here with:  OSA on CPAP  - CPAP compliance suboptimal - Residual AHI slightly elevated.  Will adjust pressure 8 to 13 cm of water with EPR of 3 - Encourage patient to use CPAP nightly and > 4 hours each night - F/U in 1 year or sooner if needed  Clem Currier, MSN, NP-C 09/20/2023, 10:18 AM [facility] [address] [place], [place] [address] [phone]

## 2023-09-22 DIAGNOSIS — H35072 Retinal telangiectasis, left eye: Secondary | ICD-10-CM | POA: Diagnosis not present

## 2023-09-22 DIAGNOSIS — H401131 Primary open-angle glaucoma, bilateral, mild stage: Secondary | ICD-10-CM | POA: Diagnosis not present

## 2023-09-22 DIAGNOSIS — E113311 Type 2 diabetes mellitus with moderate nonproliferative diabetic retinopathy with macular edema, right eye: Secondary | ICD-10-CM | POA: Diagnosis not present

## 2023-09-22 DIAGNOSIS — H35071 Retinal telangiectasis, right eye: Secondary | ICD-10-CM | POA: Diagnosis not present

## 2023-09-22 DIAGNOSIS — E113312 Type 2 diabetes mellitus with moderate nonproliferative diabetic retinopathy with macular edema, left eye: Secondary | ICD-10-CM | POA: Diagnosis not present

## 2023-09-22 LAB — HM DIABETES EYE EXAM

## 2023-09-26 NOTE — Telephone Encounter (Signed)
 HST HTA Siegfried Dress: [id number] (exp. 09/20/23 to 12/19/23)

## 2023-10-03 DIAGNOSIS — E113312 Type 2 diabetes mellitus with moderate nonproliferative diabetic retinopathy with macular edema, left eye: Secondary | ICD-10-CM | POA: Diagnosis not present

## 2023-10-03 DIAGNOSIS — H401131 Primary open-angle glaucoma, bilateral, mild stage: Secondary | ICD-10-CM | POA: Diagnosis not present

## 2023-10-03 DIAGNOSIS — H35072 Retinal telangiectasis, left eye: Secondary | ICD-10-CM | POA: Diagnosis not present

## 2023-10-03 DIAGNOSIS — E113311 Type 2 diabetes mellitus with moderate nonproliferative diabetic retinopathy with macular edema, right eye: Secondary | ICD-10-CM | POA: Diagnosis not present

## 2023-10-03 DIAGNOSIS — H35071 Retinal telangiectasis, right eye: Secondary | ICD-10-CM | POA: Diagnosis not present

## 2023-10-21 ENCOUNTER — Ambulatory Visit (INDEPENDENT_AMBULATORY_CARE_PROVIDER_SITE_OTHER): Admitting: Neurology

## 2023-10-21 DIAGNOSIS — G4733 Obstructive sleep apnea (adult) (pediatric): Secondary | ICD-10-CM

## 2023-11-03 DIAGNOSIS — H401131 Primary open-angle glaucoma, bilateral, mild stage: Secondary | ICD-10-CM | POA: Diagnosis not present

## 2023-11-03 DIAGNOSIS — G4733 Obstructive sleep apnea (adult) (pediatric): Secondary | ICD-10-CM | POA: Diagnosis not present

## 2023-11-03 DIAGNOSIS — H35071 Retinal telangiectasis, right eye: Secondary | ICD-10-CM | POA: Diagnosis not present

## 2023-11-03 DIAGNOSIS — H35072 Retinal telangiectasis, left eye: Secondary | ICD-10-CM | POA: Diagnosis not present

## 2023-11-03 DIAGNOSIS — E113311 Type 2 diabetes mellitus with moderate nonproliferative diabetic retinopathy with macular edema, right eye: Secondary | ICD-10-CM | POA: Diagnosis not present

## 2023-11-03 DIAGNOSIS — E113312 Type 2 diabetes mellitus with moderate nonproliferative diabetic retinopathy with macular edema, left eye: Secondary | ICD-10-CM | POA: Diagnosis not present

## 2023-11-03 NOTE — Progress Notes (Unsigned)
 See procedure note.

## 2023-11-04 ENCOUNTER — Encounter: Payer: Self-pay | Admitting: Adult Health

## 2023-11-04 NOTE — Procedures (Signed)
 [facility]  HOME SLEEP TEST (SANSA) REPORT (Mail-Out Device):   STUDY DATE: 10/23/23  DOB: 11-03-1934  MRN: [id number]  ORDERING CLINICIAN: True Mar, MD, PhD   REFERRING CLINICIAN: Duwaine Russell, NP  CLINICAL INFORMATION/HISTORY: 89 year old lady with an underlying medical history of glaucoma, diabetes, hypertension, hearing loss, hyperlipidemia, and borderline obesity, who presents for evaluation of her obstructive sleep apnea.  She has been on AutoPap of 7 to 13 cm with EPR of 3. She has had variable apnea control and variable compliance.  She should be eligible for a new machine.     BMI (at the time of sleep clinic visit and/or test date): 27.2 kg/m  FINDINGS:   Study Protocol:    The SANSA single-point-of-skin-contact chest-worn sensor - an FDA cleared and DOT approved type 4 home sleep test device - measures eight physiological channels,  including blood oxygen saturation (measured via PPG [photoplethysmography]), EKG-derived heart rate, respiratory effort, chest movement (measured via accelerometer), snoring, body position, and actigraphy. The device is designed to be worn for up to 10 hours per study.   Sleep Summary:   Total Recording Time (hours, min): 9 hours, 58 min  Total Effective Sleep Time (hours, min):  4 hours, 45 min  Sleep Efficiency (%):    60%   Respiratory Indices:   Calculated sAHI (per hour):  40.1/hour         Oxygen Saturation Statistics:    Oxygen Saturation (%) Mean: 91.7%   Minimum oxygen saturation (%):                 64.1%   O2 Saturation Range (%): 64.1-100%   Time below or at 88% saturation: 41 min   Pulse Rate Statistics:   Pulse Mean (bpm):    62/min    Pulse Range (49-121/min)   Snoring: Mild to moderate  IMPRESSION/DIAGNOSES:   OSA (obstructive sleep apnea), severe Nocturnal Hypoxemia  RECOMMENDATIONS:   This home sleep test demonstrates severe obstructive sleep apnea with a total AHI of  41.1/hour and O2 nadir of 64.1% with significant time below or at 88% saturation of over 40 minutes for the study, indicating nocturnal hypoxemia. Snoring was detected, in the mild to moderate range.  Ongoing treatment with positive airway pressure is highly recommended. The patient has been compliant with AutoPap therapy and should be eligible for a new machine.  I would recommend keeping her settings the same for now and following compliance and compliance data.  A laboratory attended titration study can be considered in the future for optimization of treatment settings and to improve tolerance and compliance, if needed, down the road. Alternative treatment options are limited secondary to the severity of the patient's sleep disordered breathing, but may include surgical treatment with an implantable hypoglossal nerve stimulator (in carefully selected candidates, meeting criteria).  Concomitant weight loss is recommended (where clinically appropriate). Please note, that untreated obstructive sleep apnea may carry additional perioperative morbidity. Patients with significant obstructive sleep apnea should receive perioperative PAP therapy and the surgeons and particularly the anesthesiologist should be informed of the diagnosis and the severity of the sleep disordered breathing. The patient should be cautioned not to drive, work at heights, or operate dangerous or heavy equipment when tired or sleepy. Review and reiteration of good sleep hygiene measures should be pursued with any patient. Other causes of the patient's symptoms, including circadian rhythm disturbances, an underlying mood disorder, medication effect and/or an underlying medical problem cannot be ruled out based on this  test. Clinical correlation is recommended.  The patient and her referring provider will be notified of the test results. The patient will be seen in follow up in sleep clinic at [facility].  I certify that I have reviewed the raw data  recording prior to the issuance of this report in accordance with the standards of the American Academy of Sleep Medicine (AASM).    INTERPRETING PHYSICIAN:   True Mar, MD, PhD Medical Director, [facility]) Diplomat, ABPN (Neurology and Sleep)   [facility] [address] [place], [place] [address] [phone]

## 2023-11-06 ENCOUNTER — Ambulatory Visit: Payer: Self-pay | Admitting: Adult Health

## 2023-11-06 DIAGNOSIS — G4733 Obstructive sleep apnea (adult) (pediatric): Secondary | ICD-10-CM

## 2023-11-07 NOTE — Telephone Encounter (Signed)
 I called pt. I advised pt that Megan, NP reviewed their sleep study results and found that pt has severe osa. Megan, NP recommends that pt continue using a CPAP. I reviewed PAP compliance expectations with the pt. Pt is agreeable to continue using a CPAP. Hers is over 89 years old per her report. I advised pt that an order will be sent to a DME, [facility], and [facility] will call the pt within about one week after they file with the pt's insurance. [facility] will show the pt how to use the machine, fit for masks, and troubleshoot the auto-PAP if needed. A follow up appt was made for insurance purposes with Duwaine, NP on 02/09/2024 at 11:00am. Pt verbalized understanding to arrive 15 minutes early and bring their CPAP. Patient verbalized understanding of results. Pt had no questions at this time but was encouraged to call back if questions arise. I have sent the order to [facility] and have received confirmation that they have received the order.

## 2023-11-07 NOTE — Telephone Encounter (Signed)
-----   Message from [facility] sent at 11/06/2023  1:10 PM EDT ----- If she is amendable, we can order. ----- Message ----- From: Buck Saucer, MD Sent: 11/04/2023  12:54 PM EDT To: Duwaine Russell, NP

## 2023-11-07 NOTE — Progress Notes (Signed)
 Will get VO from [place], NP for order. She has been on AutoPap of 7 to 13 cm with EPR of 3.

## 2023-11-07 NOTE — Telephone Encounter (Signed)
 Received VO from Megan, NP for auto-pap order. Order placed.

## 2023-11-28 DIAGNOSIS — E113311 Type 2 diabetes mellitus with moderate nonproliferative diabetic retinopathy with macular edema, right eye: Secondary | ICD-10-CM | POA: Diagnosis not present

## 2023-11-28 DIAGNOSIS — E113312 Type 2 diabetes mellitus with moderate nonproliferative diabetic retinopathy with macular edema, left eye: Secondary | ICD-10-CM | POA: Diagnosis not present

## 2023-11-28 DIAGNOSIS — H35073 Retinal telangiectasis, bilateral: Secondary | ICD-10-CM | POA: Diagnosis not present

## 2023-11-28 DIAGNOSIS — H401131 Primary open-angle glaucoma, bilateral, mild stage: Secondary | ICD-10-CM | POA: Diagnosis not present

## 2023-11-28 LAB — HM DIABETES EYE EXAM

## 2023-11-29 DIAGNOSIS — G4733 Obstructive sleep apnea (adult) (pediatric): Secondary | ICD-10-CM | POA: Diagnosis not present

## 2023-12-22 DIAGNOSIS — H35072 Retinal telangiectasis, left eye: Secondary | ICD-10-CM | POA: Diagnosis not present

## 2023-12-22 DIAGNOSIS — E113311 Type 2 diabetes mellitus with moderate nonproliferative diabetic retinopathy with macular edema, right eye: Secondary | ICD-10-CM | POA: Diagnosis not present

## 2023-12-22 DIAGNOSIS — H401131 Primary open-angle glaucoma, bilateral, mild stage: Secondary | ICD-10-CM | POA: Diagnosis not present

## 2023-12-22 DIAGNOSIS — E113312 Type 2 diabetes mellitus with moderate nonproliferative diabetic retinopathy with macular edema, left eye: Secondary | ICD-10-CM | POA: Diagnosis not present

## 2023-12-22 DIAGNOSIS — H35071 Retinal telangiectasis, right eye: Secondary | ICD-10-CM | POA: Diagnosis not present

## 2023-12-22 LAB — HM DIABETES EYE EXAM

## 2023-12-30 DIAGNOSIS — G4733 Obstructive sleep apnea (adult) (pediatric): Secondary | ICD-10-CM | POA: Diagnosis not present

## 2024-01-23 DIAGNOSIS — H35071 Retinal telangiectasis, right eye: Secondary | ICD-10-CM | POA: Diagnosis not present

## 2024-01-23 DIAGNOSIS — E113312 Type 2 diabetes mellitus with moderate nonproliferative diabetic retinopathy with macular edema, left eye: Secondary | ICD-10-CM | POA: Diagnosis not present

## 2024-01-23 DIAGNOSIS — H35072 Retinal telangiectasis, left eye: Secondary | ICD-10-CM | POA: Diagnosis not present

## 2024-01-23 DIAGNOSIS — E113311 Type 2 diabetes mellitus with moderate nonproliferative diabetic retinopathy with macular edema, right eye: Secondary | ICD-10-CM | POA: Diagnosis not present

## 2024-01-23 DIAGNOSIS — H401131 Primary open-angle glaucoma, bilateral, mild stage: Secondary | ICD-10-CM | POA: Diagnosis not present

## 2024-01-23 LAB — OPHTHALMOLOGY REPORT-SCANNED

## 2024-02-02 ENCOUNTER — Encounter: Payer: Self-pay | Admitting: Internal Medicine

## 2024-02-02 DIAGNOSIS — E113312 Type 2 diabetes mellitus with moderate nonproliferative diabetic retinopathy with macular edema, left eye: Secondary | ICD-10-CM | POA: Diagnosis not present

## 2024-02-02 DIAGNOSIS — E113311 Type 2 diabetes mellitus with moderate nonproliferative diabetic retinopathy with macular edema, right eye: Secondary | ICD-10-CM | POA: Diagnosis not present

## 2024-02-02 DIAGNOSIS — H35071 Retinal telangiectasis, right eye: Secondary | ICD-10-CM | POA: Diagnosis not present

## 2024-02-02 DIAGNOSIS — H35072 Retinal telangiectasis, left eye: Secondary | ICD-10-CM | POA: Diagnosis not present

## 2024-02-02 DIAGNOSIS — G4733 Obstructive sleep apnea (adult) (pediatric): Secondary | ICD-10-CM | POA: Diagnosis not present

## 2024-02-02 DIAGNOSIS — H401131 Primary open-angle glaucoma, bilateral, mild stage: Secondary | ICD-10-CM | POA: Diagnosis not present

## 2024-02-08 DIAGNOSIS — H401131 Primary open-angle glaucoma, bilateral, mild stage: Secondary | ICD-10-CM | POA: Diagnosis not present

## 2024-02-08 DIAGNOSIS — H4323 Crystalline deposits in vitreous body, bilateral: Secondary | ICD-10-CM | POA: Diagnosis not present

## 2024-02-08 DIAGNOSIS — H02423 Myogenic ptosis of bilateral eyelids: Secondary | ICD-10-CM | POA: Diagnosis not present

## 2024-02-08 DIAGNOSIS — E113313 Type 2 diabetes mellitus with moderate nonproliferative diabetic retinopathy with macular edema, bilateral: Secondary | ICD-10-CM | POA: Diagnosis not present

## 2024-02-08 DIAGNOSIS — Z961 Presence of intraocular lens: Secondary | ICD-10-CM | POA: Diagnosis not present

## 2024-02-09 ENCOUNTER — Encounter: Payer: Self-pay | Admitting: Adult Health

## 2024-02-09 ENCOUNTER — Ambulatory Visit: Admitting: Adult Health

## 2024-02-09 VITALS — BP 146/71 | HR 63 | Ht 61.0 in | Wt 138.0 lb

## 2024-02-09 DIAGNOSIS — G4733 Obstructive sleep apnea (adult) (pediatric): Secondary | ICD-10-CM | POA: Diagnosis not present

## 2024-02-09 NOTE — Progress Notes (Signed)
 PATIENT: Whitney [name] DOB: 30-May-1934  REASON FOR VISIT: follow up HISTORY FROM: patient PRIMARY NEUROLOGIST: Dr. Buck  Chief Complaint  Patient presents with   Follow-up    Pt in 18 alone Pt states doesn't use pap machine nightly      HISTORY OF PRESENT ILLNESS: Today 02/09/24:  Whitney [name] is a 89 y.o. female with a history of OSA on CPAP. Returns today for follow-up.  She did get a new machine.  She feels that she has been using it consistently however the machine states that she only used it 11 out of the last 30 days.  She has not noticed any new issues with the machine.  Her residual AHI is elevated.  She does not have a leak.  Returns today for an evaluation.   09/20/23: Whitney [name] is a 89 y.o. female with a history of obstructive sleep apnea on CPAP. Returns today for follow-up.  Her CPAP download indicates that she used her machine 50 out of 90 days.  Each night she is using her machine approximately 7 hours and 32 minutes.  Her residual AHI has increased to 14.3.  Pressure remains the same at 7 to 16 cm of water with EPR 3.  Her leak in the 95th percentile is 10.2.  Her pressure in the 95th percentile is 13.8.  Maximum pressure used was 14.5.  Her last home sleep test was in 2019.  She did get a new mask and that seems to be working better for her.  She returns today for an evaluation.  07/08/22: Whitney [name] is a 89 y.o. female with a history of obstructive sleep apnea on CPAP.  She returns today for follow-up.  Her download indicates that she used her machine 36 had a 90 days for compliance of 40%.  She used her machine greater than 4 hours only 15 days for compliance of 17%.  Her average usage is 3 hours and 57 minutes.  Her residual AHI is 8.6 on 7 to 16 cm of water with EPR 3.  Leak in the 95th percentile is 60.4 L/min.  Her pressure in the 95th percentile was 10.2 cm of water.  She states that her mask leaks at night and wakes her up.  Therefore she  typically takes it off.  She would like to use it more consistently.    REVIEW OF SYSTEMS: Out of a complete 14 system review of symptoms, the patient complains only of the following symptoms, and all other reviewed systems are negative.  ESS 11  ALLERGIES: No Known Allergies  HOME MEDICATIONS: Outpatient Medications Prior to Visit  Medication Sig Dispense Refill   amLODipine  (NORVASC ) 10 MG tablet Take 1 tablet (10 mg total) by mouth daily. 90 tablet 3   Ascorbic Acid (VITAMIN C) 100 MG tablet Take 100 mg by mouth daily.     aspirin  81 MG tablet Take 1 tablet (81 mg total) by mouth daily. 90 tablet 1   blood glucose meter kit and supplies KIT Use to test blood sugar up to 3 times daily. DX E11.9 1 each 0   Blood Glucose Monitoring Suppl DEVI 1 each by Does not apply route in the morning, at noon, and at bedtime. May substitute to any manufacturer covered by patient's insurance. 1 each 0   Calcium  Carb-Cholecalciferol (CALCIUM -VITAMIN D ) 500-200 MG-UNIT tablet Take 1 tablet by mouth 2 (two) times daily with a meal. 180 tablet 1   diclofenac  Sodium (VOLTAREN ) 1 %  GEL Apply 4 g topically 4 (four) times daily. 100 g 6   donepezil  (ARICEPT ) 10 MG tablet TAKE 1 TABLET(10 MG) BY MOUTH AT BEDTIME 90 tablet 3   dorzolamide -timolol  (COSOPT ) 2-0.5 % ophthalmic solution Instill 1 drop in each eye twice a day 10 mL 6   DUREZOL 0.05 % EMUL      empagliflozin  (JARDIANCE ) 25 MG TABS tablet Take 1 tablet (25 mg total) by mouth daily before breakfast. 90 tablet 3   glimepiride  (AMARYL ) 4 MG tablet TAKE 1 TABLET EVERY DAY BEFORE BREAKFAST 90 tablet 3   glucose blood (ONETOUCH VERIO) test strip Use to test blood sugar 3 times a day 250 each 0   Lancet Devices (ACCU-CHEK SOFTCLIX) lancets Use to test blood sugar up to 3 times a day. DX E11.9 1 each 0   lisinopril -hydrochlorothiazide  (ZESTORETIC ) 20-25 MG tablet Take 1 tablet by mouth daily. 90 tablet 3   metFORMIN  (GLUCOPHAGE ) 1000 MG tablet Take 1 tablet  (1,000 mg total) by mouth 2 (two) times daily with a meal. 180 tablet 3   Multiple Vitamin (MULTI VITAMIN DAILY PO) Take one by mouth daily     simvastatin  (ZOCOR ) 20 MG tablet Take 1 tablet (20 mg total) by mouth at bedtime. 90 tablet 3   No facility-administered medications prior to visit.    PAST MEDICAL HISTORY: Past Medical History:  Diagnosis Date   Anxiety    Baker's cyst    Chest pain    Diabetes mellitus type 2, uncontrolled    Diabetes mellitus without complication (HCC)    Disc disease, degenerative, cervical    Diverticulosis of colon    DJD (degenerative joint disease) of knee    Facial pain, atypical    GERD (gastroesophageal reflux disease)    Glaucoma    Hypercholesteremia    Hypertension    Lumbar spondylosis    Obesity    Onychomycosis    Osteopenia     PAST SURGICAL HISTORY: Past Surgical History:  Procedure Laterality Date   right eye surgery  2010-10-13   retina in right eye   VAGINAL HYSTERECTOMY  03/14/2001   Dr. Kreg repair    FAMILY HISTORY: Family History  Problem Relation Age of Onset   Diabetes Mother    Sleep apnea Son    Sleep apnea Daughter    Colon cancer Neg Hx    Stomach cancer Neg Hx     SOCIAL HISTORY: Social History   Socioeconomic History   Marital status: Widowed    Spouse name: deceased 10/12/08   Number of children: Not on file   Years of education: Not on file   Highest education level: Not on file  Occupational History   Occupation: Retired  Tobacco Use   Smoking status: Never   Smokeless tobacco: Never  Vaping Use   Vaping status: Never Used  Substance and Sexual Activity   Alcohol use: No   Drug use: No   Sexual activity: Never  Other Topics Concern   Not on file  Social History Narrative   Lives alone   Spouse passed away 03/14/09   Son;    3 girls   Clinical Cytogeneticist;    Social Drivers of Corporate Investment Banker Strain: Low Risk  (07/18/2023)   Overall Financial Resource Strain (CARDIA)    Difficulty of Paying  Living Expenses: Not very hard  Food Insecurity: No Food Insecurity (07/18/2023)   Hunger Vital Sign    Worried About Running Out of Food in  the Last Year: Never true    Ran Out of Food in the Last Year: Never true  Transportation Needs: No Transportation Needs (07/18/2023)   PRAPARE - Administrator, Civil Service (Medical): No    Lack of Transportation (Non-Medical): No  Physical Activity: Insufficiently Active (07/18/2023)   Exercise Vital Sign    Days of Exercise per Week: 4 days    Minutes of Exercise per Session: 20 min  Stress: No Stress Concern Present (07/18/2023)   [employer] of Occupational Health - Occupational Stress Questionnaire    Feeling of Stress : Not at all  Social Connections: Moderately Integrated (07/18/2023)   Social Connection and Isolation Panel    Frequency of Communication with Friends and Family: More than three times a week    Frequency of Social Gatherings with Friends and Family: Three times a week    Attends Religious Services: More than 4 times per year    Active Member of Clubs or Organizations: Yes    Attends Banker Meetings: Never    Marital Status: Widowed  Intimate Partner Violence: Patient Unable To Answer (07/18/2023)   Humiliation, Afraid, Rape, and Kick questionnaire    Fear of Current or Ex-Partner: Patient unable to answer    Emotionally Abused: Patient unable to answer    Physically Abused: Patient unable to answer    Sexually Abused: Patient unable to answer      PHYSICAL EXAM  Vitals:   02/09/24 1139  BP: (!) 146/71  Pulse: 63  Weight: 138 lb (62.6 kg)  Height: 5' 1 (1.549 m)     Body mass index is 26.07 kg/m.  Generalized: Well developed, in no acute distress  Chest: Lungs clear to auscultation bilaterally  Neurological examination  Mentation: Alert oriented to time, place, history taking. Follows all commands speech and language fluent Cranial nerve II-XII: Facial symmetry noted Gait and  station: Gait is normal.    DIAGNOSTIC DATA (LABS, IMAGING, TESTING) - I reviewed patient records, labs, notes, testing and imaging myself where available.  Lab Results  Component Value Date   WBC 5.7 08/03/2023   HGB 12.6 08/03/2023   HCT 38.9 08/03/2023   MCV 77.8 (L) 08/03/2023   PLT 233.0 08/03/2023      Component Value Date/Time   NA 138 08/03/2023 1014   K 3.7 08/03/2023 1014   CL 101 08/03/2023 1014   CO2 31 08/03/2023 1014   GLUCOSE 150 (H) 08/03/2023 1014   BUN 11 08/03/2023 1014   CREATININE 0.86 08/03/2023 1014   CALCIUM  9.9 08/03/2023 1014   PROT 7.6 08/03/2023 1014   ALBUMIN 4.0 08/03/2023 1014   AST 21 08/03/2023 1014   ALT 22 08/03/2023 1014   ALKPHOS 49 08/03/2023 1014   BILITOT 0.8 08/03/2023 1014   GFRNONAA >60 07/28/2021 1739   GFRAA 156 02/19/2008 1103   Lab Results  Component Value Date   CHOL 198 08/03/2023   HDL 48.30 08/03/2023   LDLCALC 131 (H) 08/03/2023   TRIG 96.0 08/03/2023   CHOLHDL 4 08/03/2023   Lab Results  Component Value Date   HGBA1C 8.0 (H) 08/03/2023   No results found for: VITAMINB12 Lab Results  Component Value Date   TSH 1.13 09/14/2013      ASSESSMENT AND PLAN 89 y.o. year old female  has a past medical history of Anxiety, Baker's cyst, Chest pain, Diabetes mellitus type 2, uncontrolled, Diabetes mellitus without complication (HCC), Disc disease, degenerative, cervical, Diverticulosis of colon, DJD (degenerative  joint disease) of knee, Facial pain, atypical, GERD (gastroesophageal reflux disease), Glaucoma, Hypercholesteremia, Hypertension, Lumbar spondylosis, Obesity, Onychomycosis, and Osteopenia. here with:  OSA on CPAP  - CPAP compliance suboptimal - Residual AHI slightly remains elevated we will increase pressure 7 to 14 cm of water - Encourage patient to use CPAP nightly and > 4 hours each night -I will pull another download in 1 month to see if there is any improvement.  In the future may need a CPAP  titration - F/U in 6 months or sooner if needed   Duwaine Russell, MSN, NP-C 02/09/2024, 1:49 PM [facility] [address] [place], [place] [address] [phone]  The patient's condition requires frequent monitoring and adjustments in the treatment plan, reflecting the ongoing complexity of care.  This provider is the continuing focal point for all needed services for this condition.

## 2024-02-09 NOTE — Progress Notes (Signed)
 Community message has been sent to [employer]  for pressure on 02/09/24. DD

## 2024-02-09 NOTE — Patient Instructions (Signed)
 Continue using CPAP nightly and greater than 4 hours each night Wil call you in 1 month to look at another [facility] If your symptoms worsen or you develop new symptoms please let us  know.

## 2024-02-13 ENCOUNTER — Telehealth: Payer: Self-pay

## 2024-02-13 NOTE — Telephone Encounter (Signed)
 Copied from CRM [id number]. Topic: General - Other >> Feb 13, 2024 11:13 AM Roselie BROCKS wrote: Reason for CRM: Morris from [facility] needs a return call to  confirm chronic condition of the patient ,Patient has applied for special needs plan with [facility],  A fax has also been faxed in concerning this  Phone number [phone] case number [id number]

## 2024-02-15 NOTE — Telephone Encounter (Signed)
 Paperwork has been received and is in provider's tray for signature.

## 2024-02-17 NOTE — Telephone Encounter (Signed)
 Paperwork has been signed and faxed back to [facility]. Confirmation that it was faxed.

## 2024-03-12 ENCOUNTER — Telehealth: Payer: Self-pay | Admitting: *Deleted

## 2024-03-12 DIAGNOSIS — G4733 Obstructive sleep apnea (adult) (pediatric): Secondary | ICD-10-CM | POA: Diagnosis not present

## 2024-03-12 DIAGNOSIS — H35071 Retinal telangiectasis, right eye: Secondary | ICD-10-CM | POA: Diagnosis not present

## 2024-03-12 DIAGNOSIS — E113312 Type 2 diabetes mellitus with moderate nonproliferative diabetic retinopathy with macular edema, left eye: Secondary | ICD-10-CM | POA: Diagnosis not present

## 2024-03-12 DIAGNOSIS — E113311 Type 2 diabetes mellitus with moderate nonproliferative diabetic retinopathy with macular edema, right eye: Secondary | ICD-10-CM | POA: Diagnosis not present

## 2024-03-12 DIAGNOSIS — H35072 Retinal telangiectasis, left eye: Secondary | ICD-10-CM | POA: Diagnosis not present

## 2024-03-12 DIAGNOSIS — H401131 Primary open-angle glaucoma, bilateral, mild stage: Secondary | ICD-10-CM | POA: Diagnosis not present

## 2024-03-12 LAB — OPHTHALMOLOGY REPORT-SCANNED

## 2024-03-12 NOTE — Telephone Encounter (Signed)
-----   Message from Duwaine Russell sent at 03/12/2024  7:41 AM EST ----- Can you send me a DL for the last 30 days ----- Message ----- From: Russell Duwaine, NP Sent: 03/11/2024  12:00 AM EST To: Duwaine Russell, NP  DL

## 2024-03-12 NOTE — Telephone Encounter (Signed)
 Noted

## 2024-03-14 ENCOUNTER — Telehealth: Payer: Self-pay | Admitting: Internal Medicine

## 2024-03-14 NOTE — Telephone Encounter (Unsigned)
 Copied from CRM [id number]. Topic: Clinical - Medication Refill >> Mar 14, 2024  9:46 AM Mia F wrote: Medication: glucose blood (ONETOUCH VERIO) test strip   Has the patient contacted their pharmacy? Yes (Agent: If no, request that the patient contact the pharmacy for the refill. If patient does not wish to contact the pharmacy document the reason why and proceed with request.) (Agent: If yes, when and what did the pharmacy advise?)  This is the patient's preferred pharmacy:   [id number] - [place], [place] - [address] AT [facility] OF [address] [place] [address] Phone: [phone] Fax: [phone]  Is this the correct pharmacy for this prescription? Yes If no, delete pharmacy and type the correct one.   Has the prescription been filled recently? No  Is the patient out of the medication? Yes  Has the patient been seen for an appointment in the last year OR does the patient have an upcoming appointment? Yes  Can we respond through MyChart? No  Agent: Please be advised that Rx refills may take up to 3 business days. We ask that you follow-up with your pharmacy.

## 2024-03-15 MED ORDER — ONETOUCH VERIO VI STRP: ORAL_STRIP | 0 refills | Status: DC

## 2024-03-20 ENCOUNTER — Encounter: Payer: Self-pay | Admitting: Internal Medicine

## 2024-03-20 ENCOUNTER — Ambulatory Visit: Admitting: Internal Medicine

## 2024-03-20 VITALS — BP 120/60 | HR 67 | Temp 98.2°F | Ht 61.0 in | Wt 141.0 lb

## 2024-03-20 DIAGNOSIS — F09 Unspecified mental disorder due to known physiological condition: Secondary | ICD-10-CM

## 2024-03-20 DIAGNOSIS — E785 Hyperlipidemia, unspecified: Secondary | ICD-10-CM | POA: Diagnosis not present

## 2024-03-20 DIAGNOSIS — E1169 Type 2 diabetes mellitus with other specified complication: Secondary | ICD-10-CM | POA: Diagnosis not present

## 2024-03-20 LAB — POCT GLYCOSYLATED HEMOGLOBIN (HGB A1C): Hemoglobin A1C: 7.3 % — AB (ref 4.0–5.6)

## 2024-03-20 MED ORDER — ONETOUCH VERIO VI STRP: ORAL_STRIP | 11 refills | Status: AC

## 2024-03-20 NOTE — Patient Instructions (Signed)
 Your HgA1c today is 7.3 which is good.   We do not need to make any changes.

## 2024-03-20 NOTE — Assessment & Plan Note (Signed)
 Her condition is well-managed with current medication aricept , and there is no symptom worsening. Continue current medication for cognitive impairment

## 2024-03-20 NOTE — Assessment & Plan Note (Signed)
 Recent hyperglycemia was attributed to cold medicine containing sugar. Her A1c improved to 7.3 from 8.0, with no new symptoms. Continue the current diabetes management plan. She is advised to use cold medicines labeled as safe for diabetics. Prescription for strips resent to [facility].

## 2024-03-20 NOTE — Progress Notes (Signed)
 Subjective:   Patient ID: Whitney [name], female    DOB: 09-25-1934, 89 y.o.   MRN: [id number]  Discussed the use of AI scribe software for clinical note transcription with the patient, who gave verbal consent to proceed.  History of Present Illness Whitney [name] is an 89 year old female with diabetes who presents for follow-up after a fall.  She experienced a fall while attempting to get on an elevator, resulting in her teeth cutting her lip. She attributes the fall to high blood sugar levels, which she discovered were elevated after checking her blood sugar following the incident. She suspects the high blood sugar was due to taking a cold medicine that contained sugar, which she was unaware of at the time. No recurrent episodes of leg weakness or falls have occurred since then.  Her most recent A1c is 7.3, down from 8.0 previously. No new chest pain, tightness, or pressure. No breathing problems aside from sinus issues and a runny nose, which she attributes to the cold weather.  Regarding her vision, she continues to receive regular injections and reports that her vision has not changed significantly. Sometimes her vision is not as sharp as she would like, but her glasses prescription has not needed adjustment.  She is trying to stay active at home despite the cold weather, acknowledging that movement helps reduce stiffness and achiness. She uses Tylenol  as needed for back pain, which she finds helpful.  She is taking medication to help maintain her memory and reports that her memory is not worsening.  Review of Systems  Constitutional: Negative.   HENT: Negative.    Eyes: Negative.   Respiratory:  Negative for cough, chest tightness and shortness of breath.   Cardiovascular:  Negative for chest pain, palpitations and leg swelling.  Gastrointestinal:  Negative for abdominal distention, abdominal pain, constipation, diarrhea, nausea and vomiting.  Musculoskeletal: Negative.    Skin: Negative.   Neurological: Negative.   Psychiatric/Behavioral: Negative.      Objective:  Physical Exam Constitutional:      Appearance: She is well-developed.  HENT:     Head: Normocephalic and atraumatic.  Cardiovascular:     Rate and Rhythm: Normal rate and regular rhythm.  Pulmonary:     Effort: Pulmonary effort is normal. No respiratory distress.     Breath sounds: Normal breath sounds. No wheezing or rales.  Abdominal:     General: Bowel sounds are normal. There is no distension.     Palpations: Abdomen is soft.     Tenderness: There is no abdominal tenderness.  Musculoskeletal:     Cervical back: Normal range of motion.  Skin:    General: Skin is warm and dry.  Neurological:     Mental Status: She is alert and oriented to person, place, and time.     Coordination: Coordination normal.     Vitals:   03/20/24 1001  BP: 120/60  Pulse: 67  Temp: 98.2 F (36.8 C)  TempSrc: Oral  SpO2: 99%  Weight: 141 lb (64 kg)  Height: 5' 1 (1.549 m)    Assessment and Plan Assessment & Plan Type 2 diabetes mellitus with complication Recent hyperglycemia was attributed to cold medicine containing sugar. Her A1c improved to 7.3 from 8.0, with no new symptoms. Continue the current diabetes management plan. She is advised to use cold medicines labeled as safe for diabetics. Prescription for strips resent to [facility].  Mild cognitive impairment   Her condition is well-managed with current medication  aricept , and there is no symptom worsening. Continue current medication for cognitive impairment.

## 2024-03-27 LAB — OPHTHALMOLOGY REPORT-SCANNED

## 2024-04-03 ENCOUNTER — Other Ambulatory Visit

## 2024-04-03 NOTE — Progress Notes (Signed)
 Pharmacy Quality Measure Review  This patient is appearing on a report for being at risk of failing the adherence measure for hypertension (ACEi/ARB) medications this calendar year.   Medication: lisinopril -hydrochlorothiazide  20-25 mg Last fill date: 02/23/2024 for 90 day supply  Insurance report was not up to date. No action needed at this time.   Woodie Jock, PharmD PGY1 Pharmacy Resident  04/03/2024

## 2024-04-25 ENCOUNTER — Other Ambulatory Visit (HOSPITAL_BASED_OUTPATIENT_CLINIC_OR_DEPARTMENT_OTHER)

## 2024-05-02 LAB — OPHTHALMOLOGY REPORT-SCANNED

## 2024-05-15 ENCOUNTER — Other Ambulatory Visit: Payer: Self-pay

## 2024-05-15 ENCOUNTER — Telehealth: Payer: Self-pay

## 2024-05-15 LAB — OPHTHALMOLOGY REPORT-SCANNED

## 2024-05-15 MED ORDER — AMLODIPINE BESYLATE 10 MG PO TABS: 10.0000 mg | ORAL_TABLET | Freq: Every day | ORAL | 3 refills | Status: AC

## 2024-05-15 MED ORDER — GLIMEPIRIDE 4 MG PO TABS: ORAL_TABLET | ORAL | 3 refills | Status: AC

## 2024-05-15 NOTE — Telephone Encounter (Signed)
 Copied from CRM [id number]. Topic: Clinical - Medication Refill >> May 14, 2024  1:35 PM Drema MATSU wrote: Medication: amLODipine  (NORVASC ) 10 MG tablet and  glimepiride  (AMARYL ) 4 MG tablet   Has the patient contacted their pharmacy? Yes (Agent: If no, request that the patient contact the pharmacy for the refill. If patient does not wish to contact the pharmacy document the reason why and proceed with request.) needs rx (Agent: If yes, when and what did the pharmacy advise?)  This is the patient's preferred pharmacy:  [facility]  [phone]  Is this the correct pharmacy for this prescription? Yes If no, delete pharmacy and type the correct one.   Has the prescription been filled recently? no  Is the patient out of the medication? No  Has the patient been seen for an appointment in the last year OR does the patient have an upcoming appointment? Yes  Can we respond through MyChart? Yes  Agent: Please be advised that Rx refills may take up to 3 business days. We ask that you follow-up with your pharmacy.

## 2024-05-17 ENCOUNTER — Other Ambulatory Visit: Payer: Self-pay

## 2024-05-17 DIAGNOSIS — E1169 Type 2 diabetes mellitus with other specified complication: Secondary | ICD-10-CM

## 2024-05-17 MED ORDER — EMPAGLIFLOZIN 25 MG PO TABS: 25.0000 mg | ORAL_TABLET | Freq: Every day | ORAL | 1 refills | Status: AC

## 2024-05-17 MED ORDER — EMPAGLIFLOZIN 25 MG PO TABS: 25.0000 mg | ORAL_TABLET | Freq: Every day | ORAL | 0 refills | Status: DC

## 2024-05-17 NOTE — Telephone Encounter (Signed)
 [facility] day supply [facility] to [facility] and [facility] day supply [facility]. Called to notify pt but no answer. No answering service and unable to leave a message.

## 2024-07-18 ENCOUNTER — Ambulatory Visit

## 2024-08-10 ENCOUNTER — Ambulatory Visit

## 2024-11-08 ENCOUNTER — Ambulatory Visit: Admitting: Adult Health
